# Patient Record
Sex: Male | Born: 1950 | Race: White | Hispanic: No | Marital: Married | Smoking: Never smoker
Health system: Southern US, Community
[De-identification: ages and names within clinical notes are randomized; demographics above are authoritative.]

## PROBLEM LIST (undated history)

## (undated) DIAGNOSIS — Z85828 Personal history of other malignant neoplasm of skin: Secondary | ICD-10-CM

## (undated) DIAGNOSIS — K635 Polyp of colon: Secondary | ICD-10-CM

## (undated) DIAGNOSIS — Z8719 Personal history of other diseases of the digestive system: Secondary | ICD-10-CM

## (undated) DIAGNOSIS — I10 Essential (primary) hypertension: Secondary | ICD-10-CM

## (undated) DIAGNOSIS — E119 Type 2 diabetes mellitus without complications: Secondary | ICD-10-CM

## (undated) DIAGNOSIS — I77811 Abdominal aortic ectasia: Secondary | ICD-10-CM

## (undated) DIAGNOSIS — G4730 Sleep apnea, unspecified: Secondary | ICD-10-CM

## (undated) DIAGNOSIS — D649 Anemia, unspecified: Secondary | ICD-10-CM

## (undated) DIAGNOSIS — F419 Anxiety disorder, unspecified: Secondary | ICD-10-CM

## (undated) DIAGNOSIS — F329 Major depressive disorder, single episode, unspecified: Secondary | ICD-10-CM

## (undated) DIAGNOSIS — E785 Hyperlipidemia, unspecified: Secondary | ICD-10-CM

## (undated) DIAGNOSIS — M1990 Unspecified osteoarthritis, unspecified site: Secondary | ICD-10-CM

## (undated) DIAGNOSIS — E669 Obesity, unspecified: Secondary | ICD-10-CM

## (undated) DIAGNOSIS — G629 Polyneuropathy, unspecified: Secondary | ICD-10-CM

## (undated) DIAGNOSIS — F32A Depression, unspecified: Secondary | ICD-10-CM

## (undated) HISTORY — DX: Personal history of other malignant neoplasm of skin: Z85.828

## (undated) HISTORY — DX: Obesity, unspecified: E66.9

## (undated) HISTORY — DX: Type 2 diabetes mellitus without complications: E11.9

## (undated) HISTORY — PX: HERNIA REPAIR: SHX51

## (undated) HISTORY — DX: Major depressive disorder, single episode, unspecified: F32.9

## (undated) HISTORY — DX: Polyneuropathy, unspecified: G62.9

## (undated) HISTORY — PX: JOINT REPLACEMENT: SHX530

## (undated) HISTORY — PX: MEDIAL PARTIAL KNEE REPLACEMENT: SHX5965

## (undated) HISTORY — DX: Morbid (severe) obesity due to excess calories: E66.01

## (undated) HISTORY — DX: Sleep apnea, unspecified: G47.30

## (undated) HISTORY — DX: Hyperlipidemia, unspecified: E78.5

## (undated) HISTORY — DX: Polyp of colon: K63.5

## (undated) HISTORY — DX: Depression, unspecified: F32.A

## (undated) HISTORY — PX: KNEE ARTHROSCOPY: SHX127

## (undated) HISTORY — DX: Abdominal aortic ectasia: I77.811

## (undated) HISTORY — DX: Unspecified osteoarthritis, unspecified site: M19.90

---

## 2005-10-30 ENCOUNTER — Ambulatory Visit: Payer: Self-pay | Admitting: Gastroenterology

## 2006-07-18 ENCOUNTER — Ambulatory Visit: Payer: Self-pay | Admitting: General Surgery

## 2006-09-10 ENCOUNTER — Ambulatory Visit: Payer: Self-pay | Admitting: Family Medicine

## 2013-05-01 DIAGNOSIS — I10 Essential (primary) hypertension: Secondary | ICD-10-CM

## 2013-05-01 HISTORY — DX: Essential (primary) hypertension: I10

## 2014-11-11 ENCOUNTER — Ambulatory Visit (INDEPENDENT_AMBULATORY_CARE_PROVIDER_SITE_OTHER): Payer: Commercial Managed Care - HMO | Admitting: Family Medicine

## 2014-11-11 ENCOUNTER — Encounter: Payer: Self-pay | Admitting: Family Medicine

## 2014-11-11 VITALS — BP 138/87 | HR 96 | Temp 98.7°F | Ht 68.0 in | Wt 278.0 lb

## 2014-11-11 DIAGNOSIS — B356 Tinea cruris: Secondary | ICD-10-CM

## 2014-11-11 DIAGNOSIS — Z1283 Encounter for screening for malignant neoplasm of skin: Secondary | ICD-10-CM | POA: Diagnosis not present

## 2014-11-11 DIAGNOSIS — R030 Elevated blood-pressure reading, without diagnosis of hypertension: Secondary | ICD-10-CM | POA: Diagnosis not present

## 2014-11-11 DIAGNOSIS — N400 Enlarged prostate without lower urinary tract symptoms: Secondary | ICD-10-CM

## 2014-11-11 DIAGNOSIS — R3911 Hesitancy of micturition: Secondary | ICD-10-CM | POA: Diagnosis not present

## 2014-11-11 DIAGNOSIS — I10 Essential (primary) hypertension: Secondary | ICD-10-CM | POA: Insufficient documentation

## 2014-11-11 DIAGNOSIS — N410 Acute prostatitis: Secondary | ICD-10-CM | POA: Diagnosis not present

## 2014-11-11 DIAGNOSIS — F339 Major depressive disorder, recurrent, unspecified: Secondary | ICD-10-CM | POA: Insufficient documentation

## 2014-11-11 DIAGNOSIS — F330 Major depressive disorder, recurrent, mild: Secondary | ICD-10-CM | POA: Diagnosis not present

## 2014-11-11 DIAGNOSIS — IMO0001 Reserved for inherently not codable concepts without codable children: Secondary | ICD-10-CM

## 2014-11-11 DIAGNOSIS — N522 Drug-induced erectile dysfunction: Secondary | ICD-10-CM

## 2014-11-11 DIAGNOSIS — E669 Obesity, unspecified: Secondary | ICD-10-CM

## 2014-11-11 HISTORY — DX: Morbid (severe) obesity due to excess calories: E66.01

## 2014-11-11 HISTORY — DX: Obesity, unspecified: E66.9

## 2014-11-11 LAB — UA/M W/RFLX CULTURE, ROUTINE
Bilirubin, UA: NEGATIVE
Glucose, UA: NEGATIVE
Ketones, UA: NEGATIVE
Leukocytes, UA: NEGATIVE
Nitrite, UA: NEGATIVE
Protein, UA: NEGATIVE
RBC UA: NEGATIVE
Specific Gravity, UA: 1.025 (ref 1.005–1.030)
UUROB: 1 mg/dL (ref 0.2–1.0)
pH, UA: 6 (ref 5.0–7.5)

## 2014-11-11 LAB — POC HEMOCCULT BLD/STL (OFFICE/1-CARD/DIAGNOSTIC): FECAL OCCULT BLD: NEGATIVE

## 2014-11-11 MED ORDER — SULFAMETHOXAZOLE-TRIMETHOPRIM 800-160 MG PO TABS: 1.0000 | ORAL_TABLET | Freq: Two times a day (BID) | ORAL | Status: DC

## 2014-11-11 MED ORDER — SILDENAFIL CITRATE 100 MG PO TABS: 50.0000 mg | ORAL_TABLET | Freq: Every day | ORAL | Status: DC | PRN

## 2014-11-11 NOTE — Progress Notes (Signed)
BP 138/87 mmHg  Pulse 96  Temp(Src) 98.7 F (37.1 C)  Ht 5\' 8"  (1.727 m)  Wt 278 lb (126.1 kg)  BMI 42.28 kg/m2  SpO2 96%   Subjective:    Patient ID: Duane Hughes, male    DOB: 1951-04-12, 64 y.o.   MRN: [id number]  HPI: Duane Hughes is a 64 y.o. male  Chief Complaint  Patient presents with  . Establish Care  . Referral    needs referral to [facility]   He has just gotten Medicare and will return for his wellness visit He switched medication from Vibriid to venlafaxine, he feels all of a sudden, having some dysuria, takes a while to get going; maybe correlates with the medicine, real close but can't say 100% Chemical imbalance, realized he had depression; diagnosed with major depressive disorder; got onto medicine Vybriid, and he thought it was working; wasn't having bad thoughts, only rarely; but had tons of pain though, not sure if the Vybriid or not; pain goes hand in hand with depression his family member told him; venlafaxine made the pain goes away, was hard to get out of a chair; still has arthritis pain, played football; venlafaxine is really helping mood; much more laid back; no irritability Some doctor years ago said he said he had an enlarged prostate; never been an issue until 2 months ago Takes a while to start urine, hard to shut off, dribbles at the end; stream is weak; yes a little pain with urination Cholesterol has been normal in the past, not checked in a while BP always goes up at the doctor; no one in the family with HTN Big hamburger yesterday He used to weighed 295 pounds; he wishes he could lose more Wife and patient are tryiing to eat better, not eating as much, smaller portions  Some issues with sexual function; may be side effects of medicine; different from before; still getting morning erections; no loss of muscle mass, hair loss, testicular atrophy  Relevant past medical, surgical, family and social history reviewed and updated as  indicated. Interim medical history since our last visit reviewed. Allergies and medications reviewed and updated. No known family hx of depression  Review of Systems Per HPI unless specifically indicated above     Objective:    BP 138/87 mmHg  Pulse 96  Temp(Src) 98.7 F (37.1 C)  Ht 5\' 8"  (1.727 m)  Wt 278 lb (126.1 kg)  BMI 42.28 kg/m2  SpO2 96%  Wt Readings from Last 3 Encounters:  11/11/14 278 lb (126.1 kg)    Physical Exam  Constitutional: He appears well-developed and well-nourished. No distress.  Morbidly obese  HENT:  Head: Normocephalic and atraumatic.  Eyes: EOM are normal. No scleral icterus.  Neck: No thyromegaly present.  Cardiovascular: Normal rate and regular rhythm.   Pulmonary/Chest: Effort normal and breath sounds normal.  Abdominal: Soft. Bowel sounds are normal. He exhibits no distension.  Genitourinary: Rectal exam shows no external hemorrhoid, no internal hemorrhoid, no fissure, no mass, no tenderness and anal tone normal. Guaiac negative stool. Prostate is enlarged (1+, slightly tender).  Musculoskeletal: He exhibits no edema.  Neurological: Coordination normal.  Skin: Skin is warm and dry. Rash noted. No pallor.  Well-demarcated, circumscribed erythematous rash with fine scale in inner thighs, groin area  Psychiatric: He has a normal mood and affect. His behavior is normal. Judgment and thought content normal.       Assessment & Plan:   Problem List  Items Addressed This Visit      Other   Morbid obesity    Encouraged healthy eating, weight loss      Relevant Orders   Lipid Panel w/o Chol/HDL Ratio (Completed)   Comprehensive metabolic panel (Completed)   Major depressive disorder, recurrent - Primary    He sounds to be doing well with current medication, though bothered by potential side effects; he wishes to stay on current medicine      Relevant Medications   buPROPion (WELLBUTRIN SR) 150 MG 12 hr tablet   venlafaxine XR (EFFEXOR-XR)  150 MG 24 hr capsule   Elevated blood pressure    Improved on recheck; DASH guidelines encouraged; weight loss would help      Urinary hesitancy    May be secondary to prostatitis; may be secondary to medication; consider flomax vs proscar      Relevant Orders   PSA (Completed)   CBC with Differential/Platelet (Completed)   UA/M w/rflx Culture, Routine (Completed)   Enlarged prostate    Check PSA (drawn prior to DRE); consider Proscar or similar agent      Relevant Orders   PSA (Completed)   POC Hemoccult Bld/Stl (1-Cd Office Dx) (Completed)   Skin exam, screening for cancer    Patient already has an appt with dermatologist for skin exam; significant solar damage on arms, likely has some AKs      Relevant Orders   Ambulatory referral to Dermatology   Drug-induced erectile dysfunction    Trial of viagra; discussed use, risk of combination with nitrates, unsafe drop in BP, make sure to let medical personnel know if he has chest pain and has taken one within last few days       Other Visit Diagnoses    Prostatitis, acute        Tinea cruris        Relevant Medications    sulfamethoxazole-trimethoprim (BACTRIM DS,SEPTRA DS) 800-160 MG per tablet    econazole nitrate 1 % cream       Follow up plan: Return in about 3 weeks (around 12/02/2014) for Welcome to Medicare Visit.

## 2014-11-11 NOTE — Patient Instructions (Addendum)
Start new antibiotics Let's recheck PSA in [redacted] weeks along with blood pressure, Welcome to Medicare visit You may try the new Viagra Your goal blood pressure is less than 150 mmHg on top Try to follow the DASH guidelines (DASH stands for Dietary Approaches to Stop Hypertension) Try to limit the sodium in your diet.  Ideally, consume less than 1.5 grams (less than 1,500mg ) per day. Do not add salt when cooking or at the table.  Check the sodium amount on labels when shopping, and choose items lower in sodium when given a choice. Avoid or limit foods that already contain a lot of sodium. Eat a diet rich in fruits and vegetables and whole grains. I would suggest a low-dose (81 mg) baby aspirin daily (coated) for heart protection

## 2014-11-12 LAB — CBC WITH DIFFERENTIAL/PLATELET
Basophils Absolute: 0 10*3/uL (ref 0.0–0.2)
Basos: 1 %
EOS (ABSOLUTE): 0.2 10*3/uL (ref 0.0–0.4)
Eos: 3 %
HEMOGLOBIN: 14.3 g/dL (ref 12.6–17.7)
Hematocrit: 43.5 % (ref 37.5–51.0)
IMMATURE GRANS (ABS): 0 10*3/uL (ref 0.0–0.1)
Immature Granulocytes: 0 %
Lymphocytes Absolute: 2 10*3/uL (ref 0.7–3.1)
Lymphs: 28 %
MCH: 29.7 pg (ref 26.6–33.0)
MCHC: 32.9 g/dL (ref 31.5–35.7)
MCV: 90 fL (ref 79–97)
Monocytes Absolute: 0.6 10*3/uL (ref 0.1–0.9)
Monocytes: 8 %
Neutrophils Absolute: 4.3 10*3/uL (ref 1.4–7.0)
Neutrophils: 60 %
Platelets: 293 10*3/uL (ref 150–379)
RBC: 4.81 x10E6/uL (ref 4.14–5.80)
RDW: 14.1 % (ref 12.3–15.4)
WBC: 7.2 10*3/uL (ref 3.4–10.8)

## 2014-11-12 LAB — LIPID PANEL W/O CHOL/HDL RATIO
Cholesterol, Total: 197 mg/dL (ref 100–199)
HDL: 40 mg/dL (ref >39)
LDL CALC: 101 mg/dL — AB (ref 0–99)
Triglycerides: 281 mg/dL — ABNORMAL HIGH (ref 0–149)
VLDL Cholesterol Cal: 56 mg/dL — ABNORMAL HIGH (ref 5–40)

## 2014-11-12 LAB — COMPREHENSIVE METABOLIC PANEL
A/G RATIO: 1.4 (ref 1.1–2.5)
ALK PHOS: 71 IU/L (ref 39–117)
ALT: 43 IU/L (ref 0–44)
AST: 40 IU/L (ref 0–40)
Albumin: 4.4 g/dL (ref 3.6–4.8)
BUN / CREAT RATIO: 16 (ref 10–22)
BUN: 16 mg/dL (ref 8–27)
Bilirubin Total: 0.3 mg/dL (ref 0.0–1.2)
CHLORIDE: 100 mmol/L (ref 97–108)
CO2: 22 mmol/L (ref 18–29)
CREATININE: 1.02 mg/dL (ref 0.76–1.27)
Calcium: 9.7 mg/dL (ref 8.6–10.2)
GFR calc non Af Amer: 77 mL/min/{1.73_m2} (ref >59)
GFR, EST AFRICAN AMERICAN: 89 mL/min/{1.73_m2} (ref >59)
GLUCOSE: 115 mg/dL — AB (ref 65–99)
Globulin, Total: 3.1 g/dL (ref 1.5–4.5)
Potassium: 4.5 mmol/L (ref 3.5–5.2)
Sodium: 144 mmol/L (ref 134–144)
TOTAL PROTEIN: 7.5 g/dL (ref 6.0–8.5)

## 2014-11-12 LAB — PSA: PROSTATE SPECIFIC AG, SERUM: 0.8 ng/mL (ref 0.0–4.0)

## 2014-11-13 DIAGNOSIS — N522 Drug-induced erectile dysfunction: Secondary | ICD-10-CM | POA: Insufficient documentation

## 2014-11-13 MED ORDER — ECONAZOLE NITRATE 1 % EX CREA: TOPICAL_CREAM | Freq: Every day | CUTANEOUS | Status: DC

## 2014-11-13 NOTE — Assessment & Plan Note (Signed)
Trial of viagra; discussed use, risk of combination with nitrates, unsafe drop in BP, make sure to let medical personnel know if he has chest pain and has taken one within last few days

## 2014-11-13 NOTE — Assessment & Plan Note (Signed)
Patient already has an appt with dermatologist for skin exam; significant solar damage on arms, likely has some AKs

## 2014-11-13 NOTE — Assessment & Plan Note (Signed)
Improved on recheck; DASH guidelines encouraged; weight loss would help

## 2014-11-13 NOTE — Assessment & Plan Note (Signed)
Check PSA (drawn prior to DRE); consider Proscar or similar agent

## 2014-11-13 NOTE — Assessment & Plan Note (Signed)
Encouraged healthy eating, weight loss 

## 2014-11-13 NOTE — Assessment & Plan Note (Signed)
He sounds to be doing well with current medication, though bothered by potential side effects; he wishes to stay on current medicine

## 2014-11-13 NOTE — Assessment & Plan Note (Signed)
May be secondary to prostatitis; may be secondary to medication; consider flomax vs proscar

## 2014-11-17 ENCOUNTER — Telehealth: Payer: Self-pay | Admitting: Family Medicine

## 2014-11-17 DIAGNOSIS — E781 Pure hyperglyceridemia: Secondary | ICD-10-CM

## 2014-11-17 NOTE — Telephone Encounter (Signed)
Home number disconnected. Tried calling work number and it says I am not dialing it correctly.

## 2014-11-17 NOTE — Telephone Encounter (Signed)
Please let patient know labs are back His kidney function and liver function tests look great See how he's doing overall, any problems with the antibiotics Suggest that since he's on antibiotics, have him either eat yogurt or take probiotics for a few weeks to replace some of the healthy germs in the gut that may be affected by the antibiotic If he's still having symptoms, let me know Triglycerides are high, part of the cholesterol panel, so suggest that he work on weight loss (easier said than done), diet low in sweets and simple carbs, and start fish oil or krill oil twice a day

## 2014-11-18 NOTE — Telephone Encounter (Signed)
Attempted to reach patient again, neither number working.

## 2014-11-19 NOTE — Telephone Encounter (Signed)
Number disconnected still.

## 2014-11-20 NOTE — Telephone Encounter (Signed)
Send note if unable to reach

## 2014-11-24 ENCOUNTER — Encounter: Payer: Self-pay | Admitting: Family Medicine

## 2014-11-24 NOTE — Telephone Encounter (Signed)
Mailing copy of this communication

## 2014-11-30 ENCOUNTER — Other Ambulatory Visit: Payer: Self-pay | Admitting: Family Medicine

## 2014-11-30 NOTE — Telephone Encounter (Signed)
Routing to provider  

## 2014-12-01 ENCOUNTER — Telehealth: Payer: Self-pay | Admitting: Family Medicine

## 2014-12-16 NOTE — Telephone Encounter (Signed)
None

## 2014-12-17 ENCOUNTER — Encounter: Payer: Self-pay | Admitting: Family Medicine

## 2014-12-17 ENCOUNTER — Ambulatory Visit (INDEPENDENT_AMBULATORY_CARE_PROVIDER_SITE_OTHER): Payer: Commercial Managed Care - HMO | Admitting: Family Medicine

## 2014-12-17 VITALS — BP 142/88 | HR 87 | Temp 99.0°F | Wt 286.0 lb

## 2014-12-17 DIAGNOSIS — I10 Essential (primary) hypertension: Secondary | ICD-10-CM

## 2014-12-17 DIAGNOSIS — Z136 Encounter for screening for cardiovascular disorders: Secondary | ICD-10-CM | POA: Diagnosis not present

## 2014-12-17 DIAGNOSIS — Z23 Encounter for immunization: Secondary | ICD-10-CM | POA: Diagnosis not present

## 2014-12-17 DIAGNOSIS — Z0000 Encounter for general adult medical examination without abnormal findings: Secondary | ICD-10-CM | POA: Diagnosis not present

## 2014-12-17 DIAGNOSIS — Z1211 Encounter for screening for malignant neoplasm of colon: Secondary | ICD-10-CM | POA: Diagnosis not present

## 2014-12-17 DIAGNOSIS — Z87891 Personal history of nicotine dependence: Secondary | ICD-10-CM

## 2014-12-17 NOTE — Assessment & Plan Note (Signed)
DASH guidelines, weight loss 

## 2014-12-17 NOTE — Assessment & Plan Note (Signed)
Encouraged weight loss 

## 2014-12-17 NOTE — Progress Notes (Signed)
Patient: Duane Hughes, Male    DOB: July 13, 1950, 64 y.o.   MRN: [id number]  Visit Date: 12/17/2014  Today's Provider: Enid Derry, MD   Chief Complaint  Patient presents with  . Annual Exam    Medicare Wellness Exam    Subjective:   Duane Hughes is a 64 y.o. male who presents today for his Welcome to [facility] Wellness Visit.  Caregiver input:  N/a  USTSPF Grade A and B guidelines addressed below: Alcohol: n/a Depression: PHQ score 0 Hypertension: high today; he gives platelets and they check that all the time, goodness of his heart donation; had some salty foods, but not excessive salt in the last 48 hours; just came from [place] with grilled chicken Obesity: up from vacation, five pounds; he is trying to eat better; cutting out fries Tobacco use: former recreational smoker HIV and Hep C: politely declined Lipids: just done Glucose: just done Colorectal cancer screening: due, last more than five years ago, maybe 8-10 years Breast cancer: no breast lumps Lung cancer: recreational, not a candidate for CT scan Osteoporosis: not at risk AAA: ordered US Aspirin: he gives platelets; I recommended low dose 81 mg aspirin at last visit and he started but wants to do platelet donation Fall prevention: not at risk Skin cancer prevention: sunscreen recommended Exercise: there is room for improvement but he plays golf 2-3 days a week, uses a cart  Depression screen PHQ 2/9 12/17/2014  Decreased Interest 0  Down, Depressed, Hopeless 0  PHQ - 2 Score 0   HPI  Here for medicare wellness visit  Review of Systems  Past Medical History  Diagnosis Date  . Arthritis     back, hands, knees  . History of skin cancer     hand  . Sleep apnea     Past Surgical History  Procedure Laterality Date  . Medial partial knee replacement Left     2000s?  Marland Kitchen Hernia repair    . Knee arthroscopy      several scopes done   Family History  Problem Relation Age of Onset  . Alcohol abuse  Mother   . Rheum arthritis Mother    Social History   Social History  . Marital Status: Married    Spouse Name: N/A  . Number of Children: N/A  . Years of Education: N/A   Occupational History  . Not on file.   Social History Main Topics  . Smoking status: Never Smoker   . Smokeless tobacco: Never Used  . Alcohol Use: No  . Drug Use: No  . Sexual Activity: Not on file   Other Topics Concern  . Not on file   Social History Narrative    Outpatient Encounter Prescriptions as of 12/17/2014  Medication Sig  . buPROPion (WELLBUTRIN SR) 150 MG 12 hr tablet Take 150 mg by mouth 2 (two) times daily.  . sildenafil (VIAGRA) 100 MG tablet Take 0.5 tablets (50 mg total) by mouth daily as needed for erectile dysfunction.  Marland Kitchen venlafaxine XR (EFFEXOR-XR) 150 MG 24 hr capsule TAKE 1 CAPSULE BY MOUTH DAILY  . econazole nitrate 1 % cream Apply topically daily. Apply to rash in groin once a day (Patient not taking: Reported on 12/17/2014)  . [DISCONTINUED] sulfamethoxazole-trimethoprim (BACTRIM DS,SEPTRA DS) 800-160 MG per tablet Take 1 tablet by mouth 2 (two) times daily. (Patient not taking: Reported on 12/17/2014)   No facility-administered encounter medications on file as of 12/17/2014.    Functional Ability / Safety Screening  1.  Was the timed Get Up and Go test longer than 30 seconds?  no 2.  Does the patient need help with the phone, transportation, shopping,      preparing meals, housework, laundry, medications, or managing money?  no 3.  Does the patient's home have:  loose throw rugs in the hallway?   yes      Grab bars in the bathroom? no      Handrails on the stairs?   yes      Poor lighting?   no 4.  Has the patient noticed any hearing difficulties?   yes Two hearing aides  Fall Risk Assessment See under rooming  Depression Screen See under rooming Depression screen [facility] 2/9 12/17/2014  Decreased Interest 0  Down, Depressed, Hopeless 0  PHQ - 2 Score 0   Advanced  Directives Does patient have a HCPOA?    not sure  If yes, name and contact information: Selinda Eon at cell number [phone], work number [phone] Does patient have a living will or MOST form?  yes  Objective:   Vitals: BP 156/96 mmHg  Pulse 87  Temp(Src) 99 F (37.2 C)  Wt 286 lb (129.729 kg)  SpO2 97% Body mass index is 43.5 kg/(m^2).  Visual Acuity Screening   Right eye Left eye Both eyes  Without correction: 20/20 20/20   With correction:      Physical Exam  Constitutional: He appears well-developed and well-nourished. No distress.  Cardiovascular: Normal rate.   Pulmonary/Chest: Effort normal.   Mood/affect:  Euthymic, very pleasant Appearance:  Casual, neat, good hygiene  Cognitive Testing - 6-CIT  Correct? Score   What year is it? yes 0 Yes = 0    No = 4  What month is it? yes 0 Yes = 0    No = 3  Remember:     Duane Hughes, [place], [place]     What time is it? yes 0 Yes = 0    No = 3  Count backwards from 20 to 1 yes 0 Correct = 0    1 error = 2   More than 1 error = 4  Say the months of the year in reverse. yes 0 Correct = 0    1 error = 2   More than 1 error = 4  What address did I ask you to remember? yes 0 Correct = 0  1 error = 2    2 error = 4    3 error = 6    4 error = 8    All wrong = 10       TOTAL SCORE  0/28   Interpretation:  Normal  Normal (0-7) Abnormal (8-28)    Assessment & Plan:     Annual Wellness Visit  Reviewed patient's Family Medical History Reviewed and updated list of patient's medical providers Assessment of cognitive impairment was done Assessed patient's functional ability Established a written schedule for health screening Marissa Completed and Reviewed   There is no immunization history on file for this patient.  Health Maintenance  Topic Date Due  . Hepatitis C Screening  03-14-1951  . HIV Screening  11/27/1964  . TETANUS/TDAP  11/27/1968  . ZOSTAVAX  11/27/2009  . PNA vac Low Risk  Adult (1 of 2 - PCV13) 11/28/2014  . INFLUENZA VACCINE  11/30/2014  . COLONOSCOPY  05/01/2016     Discussed health benefits of physical activity, and encouraged him  to engage in regular exercise appropriate for his age and condition.   No orders of the defined types were placed in this encounter.    Current outpatient prescriptions:  .  buPROPion (WELLBUTRIN SR) 150 MG 12 hr tablet, Take 150 mg by mouth 2 (two) times daily., Disp: , Rfl:  .  sildenafil (VIAGRA) 100 MG tablet, Take 0.5 tablets (50 mg total) by mouth daily as needed for erectile dysfunction., Disp: 5 tablet, Rfl: 11 .  venlafaxine XR (EFFEXOR-XR) 150 MG 24 hr capsule, TAKE 1 CAPSULE BY MOUTH DAILY, Disp: 30 capsule, Rfl: 0 .  econazole nitrate 1 % cream, Apply topically daily. Apply to rash in groin once a day (Patient not taking: Reported on 12/17/2014), Disp: 30 g, Rfl: 0 Medications Discontinued During This Encounter  Medication Reason  . sulfamethoxazole-trimethoprim (BACTRIM DS,SEPTRA DS) 800-160 MG per tablet Completed Course    Next Medicare Wellness Visit in 12+ months  Problem List Items Addressed This Visit      Cardiovascular and Mediastinum   Benign hypertension    DASH guidelines, weight loss        Other   Morbid obesity    Encouraged weight loss      Colon cancer screening    Refer to GI for colonoscopy      Relevant Orders   Ambulatory referral to Gastroenterology   Preventative health care - Primary    Welcome to medicare visit performed; age-appropriate guidelines followed, tests ordered, return in 1 year for next medicare visit      Screening for cardiovascular condition    EKG done at part of Welcome to Medicare visit; copy given to patient to keep with him; NSR, no abnormal ST-T wave changes      Relevant Orders   EKG 12-Lead (Completed)   Need for prophylactic vaccination against Streptococcus pneumoniae (pneumococcus)    PCV-13 recommended today (Prevnar) and given; no more  PCV-13 vaccines recommended in his lifetime per current ACIP guidelines; next pneumonia vaccine will be the PPSV-23 (Pneumovax) in 6-12 months      Relevant Orders   Pneumococcal conjugate vaccine 13-valent (Completed)   Hx of tobacco use, presenting hazards to health    [place] to screen for AAA, history of tobacco use      Relevant Orders   [place] Screening AAA

## 2014-12-17 NOTE — Patient Instructions (Addendum)
Strengthening leg exercises recommended and fall prevention to help decrease falls and fractures in the future You were given PCV-13 (Prevnar) today to help prevent pneumonia You will be due for a PPSV-23 (Pneumovax) 6-12 months from now If you are interested in a shingles vaccine in the future, you can get that at a local pharmacy 30 or more days from now  Flu shots recommended yearly around October I have entered a referral for a colonoscopy I have ordered a one-time screening test to rule-out an abdominal aortic aneurysm (or AAA) If you have not heard anything from my staff in a week about any orders/referrals/studies from today, please contact us here to follow-up [phone] Stay active, building up gradually to 150 minutes per week A baby (81 mg) aspirin is recommended daily, so please do talk with the facility where 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  Maintain regular health, dental, and eye exams.  Eat a healthy diet. Foods like vegetables, fruits, whole grains, low-fat dairy products, and lean protein foods contain the nutrients you need and are low in calories. Decrease your intake of foods high in solid fats, added sugars, and salt. Get information about a proper diet from your health care provider, if necessary.  Regular physical exercise is one of the most important things you can do for your health. Most adults should get at least 150 minutes of moderate-intensity  exercise (any activity that increases your heart rate and causes you to sweat) each week. In addition, most adults need muscle-strengthening exercises on 2 or more days a week.   Maintain a healthy weight. The body mass index (BMI) is a screening tool to identify possible weight problems. It provides an estimate of body fat based on height and weight. Your health care provider can find your BMI and can help you achieve or maintain a healthy weight. For males 20 years and older:  A BMI below 18.5 is considered underweight.  A BMI of 18.5 to 24.9 is normal.  A BMI of 25 to 29.9 is considered overweight.  A BMI of 30 and above is considered obese.  Maintain normal blood lipids and cholesterol by exercising and minimizing your intake of saturated fat. Eat a balanced diet with plenty of fruits and vegetables. Blood tests for lipids and cholesterol should begin at age 65 and be repeated every 5 years. If your lipid or cholesterol levels are high, you are over age 30, or you are at high risk for heart disease, you may need your cholesterol levels checked more frequently.Ongoing high lipid and cholesterol levels should be treated with medicines if diet and exercise are not working.  If you smoke, find out from your health care provider how to quit.  If you do not use tobacco, do not start.  Lung cancer screening is recommended for adults aged 65-80 years who are at high risk for developing lung cancer because of a history of smoking. A yearly low-dose CT scan of the lungs is recommended for people  who have at least a 30-pack-year history of smoking and are current smokers or have quit within the past 15 years. A pack year of smoking is smoking an average of 1 pack of cigarettes a day for 1 year (for example, a 30-pack-year history of smoking could mean smoking 1 pack a day for 30 years or 2 packs a day for 15 years). Yearly screening should continue until the smoker has stopped smoking for at least 15  years. Yearly screening should be stopped for people who develop a health problem that would prevent them from having lung cancer treatment.  If you choose to drink alcohol, do not have more than 2 drinks per day. One drink is considered to be 12 oz (360 mL) of beer, 5 oz (150 mL) of wine, or 1.5 oz (45 mL) of liquor.  Avoid the use of street drugs. Do not share needles with anyone. Ask for help if you need support or instructions about stopping the use of drugs.  High blood pressure causes heart disease and increases the risk of stroke. Blood pressure should be checked at least every 1-2 years. Ongoing high blood pressure should be treated with medicines if weight loss and exercise are not effective.  If you are 105-65 years old, ask your health care provider if you should take aspirin to prevent heart disease.  Diabetes screening involves taking a blood sample to check your fasting blood sugar level. This should be done once every 3 years after age 65 if you are at a normal weight and without risk factors for diabetes. Testing should be considered at a younger age or be carried out more frequently if you are overweight and have at least 1 risk factor for diabetes.  Colorectal cancer can be detected and often prevented. Most routine colorectal cancer screening begins at the age of 27 and continues through age 63. However, your health care provider may recommend screening at an earlier age if you have risk factors for colon cancer. On a yearly basis, your health care provider may provide home test kits to check for hidden blood in the stool. A small camera at the end of a tube may be used to directly examine the colon (sigmoidoscopy or colonoscopy) to detect the earliest forms of colorectal cancer. Talk to your health care provider about this at age 65 when routine screening begins. A direct exam of the colon should be repeated every 5-10 years through age 65, unless early forms of precancerous polyps or  small growths are found.  People who are at an increased risk for hepatitis B should be screened for this virus. You are considered at high risk for hepatitis B if:  You were born in a country where hepatitis B occurs often. Talk with your health care provider about which countries are considered high risk.  Your parents were born in a high-risk country and you have not received a shot to protect against hepatitis B (hepatitis B vaccine).  You have HIV or AIDS.  You use needles to inject street drugs.  You live with, or have sex with, someone who has hepatitis B.  You are a man who has sex with other men (MSM).  You get hemodialysis treatment.  You take certain medicines for conditions like cancer, organ transplantation, and autoimmune conditions.  Hepatitis C blood testing is recommended for all people born from [birth year] through 1966 and any individual with known risk factors for hepatitis C.  Healthy men should no longer receive  prostate-specific antigen (PSA) blood tests as part of routine cancer screening. Talk to your health care provider about prostate cancer screening.  Testicular cancer screening is not recommended for adolescents or adult males who have no symptoms. Screening includes self-exam, a health care provider exam, and other screening tests. Consult with your health care provider about any symptoms you have or any concerns you have about testicular cancer.  Practice safe sex. Use condoms and avoid high-risk sexual practices to reduce the spread of sexually transmitted infections (STIs).  You should be screened for STIs, including gonorrhea and chlamydia if:  You are sexually active and are younger than 24 years.  You are older than 24 years, and your health care provider tells you that you are at risk for this type of infection.  Your sexual activity has changed since you were last screened, and you are at an increased risk for chlamydia or gonorrhea. Ask your health  care provider if you are at risk.  If you are at risk of being infected with HIV, it is recommended that you take a prescription medicine daily to prevent HIV infection. This is called pre-exposure prophylaxis (PrEP). You are considered at risk if:  You are a man who has sex with other men (MSM).  You are a heterosexual man who is sexually active with multiple partners.  You take drugs by injection.  You are sexually active with a partner who has HIV.  Talk with your health care provider about whether you are at high risk of being infected with HIV. If you choose to begin PrEP, you should first be tested for HIV. You should then be tested every 3 months for as long as you are taking PrEP.  Use sunscreen. Apply sunscreen liberally and repeatedly throughout the day. You should seek shade when your shadow is shorter than you. Protect yourself by wearing long sleeves, pants, a wide-brimmed hat, and sunglasses year round whenever you are outdoors.  Tell your health care provider of new moles or changes in moles, especially if there is a change in shape or color. Also, tell your health care provider if a mole is larger than the size of a pencil eraser.  A one-time screening for abdominal aortic aneurysm (AAA) and surgical repair of large AAAs by ultrasound is recommended for men aged 69-75 years who are current or former smokers.  Stay current with your vaccines (immunizations). Document Released: 10/14/2007 Document Revised: 04/22/2013 Document Reviewed: 09/12/2010 [facility] Patient Information 2015 [place], [place]. This information is not intended to replace advice given to you by your health care provider. Make sure you discuss any questions you have with your health care provider.  DASH Eating Plan DASH stands for "Dietary Approaches to Stop Hypertension." The DASH eating plan is a healthy eating plan that has been shown to reduce high blood pressure (hypertension). Additional health benefits  may include reducing the risk of type 2 diabetes mellitus, heart disease, and stroke. The DASH eating plan may also help with weight loss. WHAT DO I NEED TO KNOW ABOUT THE DASH EATING PLAN? For the DASH eating plan, you will follow these general guidelines:  Choose foods with a percent daily value for sodium of less than 5% (as listed on the food label).  Use salt-free seasonings or herbs instead of table salt or sea salt.  Check with your health care provider or pharmacist before using salt substitutes.  Eat lower-sodium products, often labeled as "lower sodium" or "no salt added."  Eat fresh  foods.  Eat more vegetables, fruits, and low-fat dairy products.  Choose whole grains. Look for the word "whole" as the first word in the ingredient list.  Choose fish and skinless chicken or [place] more often than red meat. Limit fish, poultry, and meat to 6 oz (170 g) each day.  Limit sweets, desserts, sugars, and sugary drinks.  Choose heart-healthy fats.  Limit cheese to 1 oz (28 g) per day.  Eat more home-cooked food and less restaurant, buffet, and fast food.  Limit fried foods.  Cook foods using methods other than frying.  Limit canned vegetables. If you do use them, rinse them well to decrease the sodium.  When eating at a restaurant, ask that your food be prepared with less salt, or no salt if possible. WHAT FOODS CAN I EAT? Seek help from a dietitian for individual calorie needs. Grains Whole grain or whole wheat bread. Brown rice. Whole grain or whole wheat pasta. Quinoa, bulgur, and whole grain cereals. Low-sodium cereals. Corn or whole wheat flour tortillas. Whole grain cornbread. Whole grain crackers. Low-sodium crackers. Vegetables Fresh or frozen vegetables (raw, steamed, roasted, or grilled). Low-sodium or reduced-sodium tomato and vegetable juices. Low-sodium or reduced-sodium tomato sauce and paste. Low-sodium or reduced-sodium canned vegetables.  Fruits All fresh,  canned (in natural juice), or frozen fruits. Meat and Other Protein Products Ground beef (85% or leaner), grass-fed beef, or beef trimmed of fat. Skinless chicken or [place]. Ground chicken or [place]. Pork trimmed of fat. All fish and seafood. Eggs. Dried beans, peas, or lentils. Unsalted nuts and seeds. Unsalted canned beans. Dairy Low-fat dairy products, such as skim or 1% milk, 2% or reduced-fat cheeses, low-fat ricotta or cottage cheese, or plain low-fat yogurt. Low-sodium or reduced-sodium cheeses. Fats and Oils Tub margarines without trans fats. Light or reduced-fat mayonnaise and salad dressings (reduced sodium). Avocado. Safflower, olive, or canola oils. Natural peanut or almond butter. Other Unsalted popcorn and pretzels. The items listed above may not be a complete list of recommended foods or beverages. Contact your dietitian for more options. WHAT FOODS ARE NOT RECOMMENDED? Grains White bread. White pasta. White rice. Refined cornbread. Bagels and croissants. Crackers that contain trans fat. Vegetables Creamed or fried vegetables. Vegetables in a cheese sauce. Regular canned vegetables. Regular canned tomato sauce and paste. Regular tomato and vegetable juices. Fruits Dried fruits. Canned fruit in light or heavy syrup. Fruit juice. Meat and Other Protein Products Fatty cuts of meat. Ribs, chicken wings, bacon, sausage, bologna, salami, chitterlings, fatback, hot dogs, bratwurst, and packaged luncheon meats. Salted nuts and seeds. Canned beans with salt. Dairy Whole or 2% milk, cream, half-and-half, and cream cheese. Whole-fat or sweetened yogurt. Full-fat cheeses or blue cheese. Nondairy creamers and whipped toppings. Processed cheese, cheese spreads, or cheese curds. Condiments Onion and garlic salt, seasoned salt, table salt, and sea salt. Canned and packaged gravies. Worcestershire sauce. Tartar sauce. Barbecue sauce. Teriyaki sauce. Soy sauce, including reduced sodium. Steak  sauce. Fish sauce. Oyster sauce. Cocktail sauce. Horseradish. Ketchup and mustard. Meat flavorings and tenderizers. Bouillon cubes. Hot sauce. Tabasco sauce. Marinades. Taco seasonings. Relishes. Fats and Oils Butter, stick margarine, lard, shortening, ghee, and bacon fat. Coconut, palm kernel, or palm oils. Regular salad dressings. Other Pickles and olives. Salted popcorn and pretzels. The items listed above may not be a complete list of foods and beverages to avoid. Contact your dietitian for more information. WHERE CAN I FIND MORE INFORMATION? National Heart, Lung, and Blood Institute: travelstabloid.com Document Released: 04/06/2011 Document Revised:  09/01/2013 Document Reviewed: 02/19/2013 [employer] Patient Information 2015 [place], [place]. This information is not intended to replace advice given to you by your health care provider. Make sure you discuss any questions you have with your health care provider.

## 2014-12-18 ENCOUNTER — Telehealth: Payer: Self-pay

## 2014-12-18 NOTE — Telephone Encounter (Signed)
[facility] called and stated that his last colonoscopy was on 10/30/05. It was normal, no polyps, repeat in 10 years.

## 2014-12-19 DIAGNOSIS — Z0000 Encounter for general adult medical examination without abnormal findings: Secondary | ICD-10-CM | POA: Insufficient documentation

## 2014-12-19 DIAGNOSIS — Z87891 Personal history of nicotine dependence: Secondary | ICD-10-CM | POA: Insufficient documentation

## 2014-12-19 DIAGNOSIS — Z1211 Encounter for screening for malignant neoplasm of colon: Secondary | ICD-10-CM | POA: Insufficient documentation

## 2014-12-19 DIAGNOSIS — Z23 Encounter for immunization: Secondary | ICD-10-CM | POA: Insufficient documentation

## 2014-12-19 NOTE — Assessment & Plan Note (Signed)
EKG done at part of Welcome to Medicare visit; copy given to patient to keep with him; NSR, no abnormal ST-T wave changes

## 2014-12-19 NOTE — Assessment & Plan Note (Signed)
Refer to GI for colonoscopy.

## 2014-12-19 NOTE — Assessment & Plan Note (Signed)
Welcome to medicare visit performed; age-appropriate guidelines followed, tests ordered, return in 1 year for next medicare visit

## 2014-12-19 NOTE — Assessment & Plan Note (Signed)
PCV-13 recommended today (Prevnar) and given; no more PCV-13 vaccines recommended in his lifetime per current ACIP guidelines; next pneumonia vaccine will be the PPSV-23 (Pneumovax) in 6-12 months

## 2014-12-19 NOTE — Assessment & Plan Note (Signed)
[place] to screen for AAA, history of tobacco use

## 2014-12-23 ENCOUNTER — Other Ambulatory Visit: Payer: Self-pay | Admitting: Family Medicine

## 2014-12-23 NOTE — Telephone Encounter (Signed)
rx

## 2014-12-24 ENCOUNTER — Other Ambulatory Visit: Payer: Self-pay | Admitting: Family Medicine

## 2014-12-24 NOTE — Telephone Encounter (Signed)
Routing to provider  

## 2015-01-01 ENCOUNTER — Other Ambulatory Visit: Payer: Self-pay | Admitting: Family Medicine

## 2015-01-01 ENCOUNTER — Ambulatory Visit
Admission: RE | Admit: 2015-01-01 | Discharge: 2015-01-01 | Disposition: A | Discharge status: Home / Self Care | Payer: Commercial Managed Care - HMO | Source: Ambulatory Visit | Attending: Family Medicine | Admitting: Family Medicine

## 2015-01-01 DIAGNOSIS — Z87891 Personal history of nicotine dependence: Secondary | ICD-10-CM | POA: Diagnosis present

## 2015-01-01 DIAGNOSIS — Z136 Encounter for screening for cardiovascular disorders: Secondary | ICD-10-CM | POA: Insufficient documentation

## 2015-01-01 NOTE — Assessment & Plan Note (Signed)
Previous tobacco use; order retroperitoneal [place] complete per radiology scheduling

## 2015-03-18 ENCOUNTER — Ambulatory Visit (INDEPENDENT_AMBULATORY_CARE_PROVIDER_SITE_OTHER): Payer: Commercial Managed Care - HMO | Admitting: Family Medicine

## 2015-03-18 ENCOUNTER — Encounter: Payer: Self-pay | Admitting: Family Medicine

## 2015-03-18 VITALS — BP 150/91 | HR 90 | Temp 97.6°F | Wt 286.0 lb

## 2015-03-18 DIAGNOSIS — I10 Essential (primary) hypertension: Secondary | ICD-10-CM | POA: Diagnosis not present

## 2015-03-18 DIAGNOSIS — E781 Pure hyperglyceridemia: Secondary | ICD-10-CM

## 2015-03-18 DIAGNOSIS — F330 Major depressive disorder, recurrent, mild: Secondary | ICD-10-CM | POA: Diagnosis not present

## 2015-03-18 DIAGNOSIS — N400 Enlarged prostate without lower urinary tract symptoms: Secondary | ICD-10-CM | POA: Diagnosis not present

## 2015-03-18 DIAGNOSIS — M25562 Pain in left knee: Secondary | ICD-10-CM

## 2015-03-18 DIAGNOSIS — M25561 Pain in right knee: Secondary | ICD-10-CM | POA: Insufficient documentation

## 2015-03-18 DIAGNOSIS — R739 Hyperglycemia, unspecified: Secondary | ICD-10-CM

## 2015-03-18 DIAGNOSIS — Z5181 Encounter for therapeutic drug level monitoring: Secondary | ICD-10-CM

## 2015-03-18 DIAGNOSIS — Z79899 Other long term (current) drug therapy: Secondary | ICD-10-CM | POA: Diagnosis not present

## 2015-03-18 DIAGNOSIS — Z23 Encounter for immunization: Secondary | ICD-10-CM | POA: Diagnosis not present

## 2015-03-18 DIAGNOSIS — G4733 Obstructive sleep apnea (adult) (pediatric): Secondary | ICD-10-CM | POA: Insufficient documentation

## 2015-03-18 MED ORDER — BUPROPION HCL ER (SR) 150 MG PO TB12: ORAL_TABLET | ORAL | Status: DC

## 2015-03-18 MED ORDER — TRAMADOL HCL 50 MG PO TABS: 50.0000 mg | ORAL_TABLET | Freq: Four times a day (QID) | ORAL | Status: DC | PRN

## 2015-03-18 MED ORDER — TRAZODONE HCL 50 MG PO TABS: ORAL_TABLET | ORAL | Status: DC

## 2015-03-18 NOTE — Assessment & Plan Note (Addendum)
Check fasting lipids; work on weight loss; he does not want medicine unless absolutely necessary; healthy eating encouraged

## 2015-03-18 NOTE — Assessment & Plan Note (Signed)
Try to lose weight, limit empty calories, drink 64 ounces of water

## 2015-03-18 NOTE — Patient Instructions (Addendum)
You received the flu shot today; it should protect you against the flu virus over the coming months; it will take about two weeks for antibodies to develop; do try to stay away from hospitals, nursing homes, and daycares during peak flu season; taking vitamin C daily during flu season may help you avoid getting sick  Use the tramadol if needed; okay to take with 500 mg tylenol (acetaminophen) up to four times a day if needed  Your goal blood pressure is less than 150 mmHg on top. Try to follow the DASH guidelines (DASH stands for Dietary Approaches to Stop Hypertension) Try to limit the sodium in your diet.  Ideally, consume less than 1.5 grams (less than 1,500mg ) per day. Do not add salt when cooking or at the table.  Check the sodium amount on labels when shopping, and choose items lower in sodium when given a choice. Avoid or limit foods that already contain a lot of sodium. Eat a diet rich in fruits and vegetables and whole grains.  Return for fasting labs tomorrow, nothing to eat or drink with calories after midnight  Check out the information at familydoctor.org entitled "What It Takes to Lose Weight" Try to lose between 1-2 pounds per week by taking in fewer calories and burning off more calories You can succeed by limiting portions, limiting foods dense in calories and fat, becoming more active, and drinking 8 glasses of water a day (64 ounces) Don't skip meals, especially breakfast, as skipping meals may alter your metabolism Do not use over-the-counter weight loss pills or gimmicks that claim rapid weight loss A healthy BMI (or body mass index) is between 18.5 and 24.9 You can calculate your ideal BMI at the [employer] website ClubMonetize.fr  Decrease bupropion to just one pill once a day for three weeks and then stop Start trazodone at bedtime, one pill at night at first, may go up to two pills after the first week    DASH Eating  Plan DASH stands for "Dietary Approaches to Stop Hypertension." The DASH eating plan is a healthy eating plan that has been shown to reduce high blood pressure (hypertension). Additional health benefits may include reducing the risk of type 2 diabetes mellitus, heart disease, and stroke. The DASH eating plan may also help with weight loss. WHAT DO I NEED TO KNOW ABOUT THE DASH EATING PLAN? For the DASH eating plan, you will follow these general guidelines:  Choose foods with a percent daily value for sodium of less than 5% (as listed on the food label).  Use salt-free seasonings or herbs instead of table salt or sea salt.  Check with your health care provider or pharmacist before using salt substitutes.  Eat lower-sodium products, often labeled as "lower sodium" or "no salt added."  Eat fresh foods.  Eat more vegetables, fruits, and low-fat dairy products.  Choose whole grains. Look for the word "whole" as the first word in the ingredient list.  Choose fish and skinless chicken or [place] more often than red meat. Limit fish, poultry, and meat to 6 oz (170 g) each day.  Limit sweets, desserts, sugars, and sugary drinks.  Choose heart-healthy fats.  Limit cheese to 1 oz (28 g) per day.  Eat more home-cooked food and less restaurant, buffet, and fast food.  Limit fried foods.  Cook foods using methods other than frying.  Limit canned vegetables. If you do use them, rinse them well to decrease the sodium.  When eating at a restaurant, ask that  your food be prepared with less salt, or no salt if possible. WHAT FOODS CAN I EAT? Seek help from a dietitian for individual calorie needs. Grains Whole grain or whole wheat bread. Brown rice. Whole grain or whole wheat pasta. Quinoa, bulgur, and whole grain cereals. Low-sodium cereals. Corn or whole wheat flour tortillas. Whole grain cornbread. Whole grain crackers. Low-sodium crackers. Vegetables Fresh or frozen vegetables (raw, steamed,  roasted, or grilled). Low-sodium or reduced-sodium tomato and vegetable juices. Low-sodium or reduced-sodium tomato sauce and paste. Low-sodium or reduced-sodium canned vegetables.  Fruits All fresh, canned (in natural juice), or frozen fruits. Meat and Other Protein Products Ground beef (85% or leaner), grass-fed beef, or beef trimmed of fat. Skinless chicken or [place]. Ground chicken or [place]. Pork trimmed of fat. All fish and seafood. Eggs. Dried beans, peas, or lentils. Unsalted nuts and seeds. Unsalted canned beans. Dairy Low-fat dairy products, such as skim or 1% milk, 2% or reduced-fat cheeses, low-fat ricotta or cottage cheese, or plain low-fat yogurt. Low-sodium or reduced-sodium cheeses. Fats and Oils Tub margarines without trans fats. Light or reduced-fat mayonnaise and salad dressings (reduced sodium). Avocado. Safflower, olive, or canola oils. Natural peanut or almond butter. Other Unsalted popcorn and pretzels. The items listed above may not be a complete list of recommended foods or beverages. Contact your dietitian for more options. WHAT FOODS ARE NOT RECOMMENDED? Grains White bread. White pasta. White rice. Refined cornbread. Bagels and croissants. Crackers that contain trans fat. Vegetables Creamed or fried vegetables. Vegetables in a cheese sauce. Regular canned vegetables. Regular canned tomato sauce and paste. Regular tomato and vegetable juices. Fruits Dried fruits. Canned fruit in light or heavy syrup. Fruit juice. Meat and Other Protein Products Fatty cuts of meat. Ribs, chicken wings, bacon, sausage, bologna, salami, chitterlings, fatback, hot dogs, bratwurst, and packaged luncheon meats. Salted nuts and seeds. Canned beans with salt. Dairy Whole or 2% milk, cream, half-and-half, and cream cheese. Whole-fat or sweetened yogurt. Full-fat cheeses or blue cheese. Nondairy creamers and whipped toppings. Processed cheese, cheese spreads, or cheese curds. Condiments Onion  and garlic salt, seasoned salt, table salt, and sea salt. Canned and packaged gravies. Worcestershire sauce. Tartar sauce. Barbecue sauce. Teriyaki sauce. Soy sauce, including reduced sodium. Steak sauce. Fish sauce. Oyster sauce. Cocktail sauce. Horseradish. Ketchup and mustard. Meat flavorings and tenderizers. Bouillon cubes. Hot sauce. Tabasco sauce. Marinades. Taco seasonings. Relishes. Fats and Oils Butter, stick margarine, lard, shortening, ghee, and bacon fat. Coconut, palm kernel, or palm oils. Regular salad dressings. Other Pickles and olives. Salted popcorn and pretzels. The items listed above may not be a complete list of foods and beverages to avoid. Contact your dietitian for more information. WHERE CAN I FIND MORE INFORMATION? National Heart, Lung, and Blood Institute: travelstabloid.com   This information is not intended to replace advice given to you by your health care provider. Make sure you discuss any questions you have with your health care provider.   Document Released: 04/06/2011 Document Revised: 05/08/2014 Document Reviewed: 02/19/2013 Elsevier Interactive Patient Education [employer].

## 2015-03-18 NOTE — Assessment & Plan Note (Signed)
Try DASH guidelines; work on weight loss; he does not want medicine, will keep an eye on it

## 2015-03-18 NOTE — Assessment & Plan Note (Signed)
Okay to approve headgear when requested

## 2015-03-18 NOTE — Assessment & Plan Note (Addendum)
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

## 2015-03-18 NOTE — Progress Notes (Signed)
BP 150/91 mmHg  Pulse 90  Temp(Src) 97.6 F (36.4 C)  Wt 286 lb (129.729 kg)  SpO2 96%   Subjective:    Patient ID: Duane Hughes, male    DOB: 1951-04-11, 64 y.o.   MRN: [id number]  HPI: Duane Hughes is a 64 y.o. male  Chief Complaint  Patient presents with  . Hyperlipidemia  . Hypertension  . Obesity   Both knees have been real real bad; the left knee was replaced, but get so sore sometimes; sometimes playing golf; hard to walk sometimes; sometimes limps; his last orthopaedist was in [place]; his surgeries were in [place] and [place]; has not used anyone locally but would like to start seeing someone here; he has used ibuprofen, very old ultracet helps  Hypertension; checks BP twice a week with plasma, never high there; does not want to do medicine  High cholesterol; he only wants to take medicine if absolutely necessary; used to eat a lot of eggs, but lots less now; almond or coconut milk, no cow's milk; not sure if high cholesterol, but strong fam hx of stroke; he is not taking aspirin, will ask [employer] if he can  He has had a few times of mood issues and gotten back into negative thoughts; passive thoughts, just feels bad, no active thoughts; has been on bupropion for some time; do we want to do something else  He would like meds to [facility], he approves request when it comes to me  He is going to need a new mask and will need approval for headgear for his sleep apnea; still looking for a company;   Relevant past medical, surgical, family and social history reviewed and updated as indicated. Interim medical history since our last visit reviewed. Allergies and medications reviewed and updated.  Review of Systems  Per HPI unless specifically indicated above     Objective:    BP 150/91 mmHg  Pulse 90  Temp(Src) 97.6 F (36.4 C)  Wt 286 lb (129.729 kg)  SpO2 96%  Wt Readings from Last 3 Encounters:  03/18/15 286 lb (129.729 kg)  12/17/14  286 lb (129.729 kg)  11/11/14 278 lb (126.1 kg)   body mass index is 43.5 kg/(m^2).  Physical Exam  Constitutional: He appears well-developed and well-nourished. No distress.  Morbidly obese  HENT:  Head: Normocephalic and atraumatic.  Eyes: EOM are normal. No scleral icterus.  Neck: No thyromegaly present.  Cardiovascular: Normal rate and regular rhythm.   Pulmonary/Chest: Effort normal and breath sounds normal.  Abdominal: Soft. Bowel sounds are normal. He exhibits no distension.  Musculoskeletal: He exhibits no edema.       Right knee: He exhibits decreased range of motion and swelling.       Left knee: He exhibits decreased range of motion and swelling.  Crepitus both knees  Neurological: Coordination normal.  Skin: Skin is warm and dry. No pallor.  Psychiatric: He has a normal mood and affect. His behavior is normal. Judgment and thought content normal.    Crepitus on both knees  Results for orders placed or performed in visit on 11/11/14  PSA  Result Value Ref Range   Prostate Specific Ag, Serum 0.8 0.0 - 4.0 ng/mL  CBC with Differential/Platelet  Result Value Ref Range   WBC 7.2 3.4 - 10.8 x10E3/uL   RBC 4.81 4.14 - 5.80 x10E6/uL   Hemoglobin 14.3 12.6 - 17.7 g/dL   Hematocrit 43.5 37.5 - 51.0 %   MCV  90 79 - 97 fL   MCH 29.7 26.6 - 33.0 pg   MCHC 32.9 31.5 - 35.7 g/dL   RDW 14.1 12.3 - 15.4 %   Platelets 293 150 - 379 x10E3/uL   Neutrophils 60 %   Lymphs 28 %   Monocytes 8 %   Eos 3 %   Basos 1 %   Neutrophils Absolute 4.3 1.4 - 7.0 x10E3/uL   Lymphocytes Absolute 2.0 0.7 - 3.1 x10E3/uL   Monocytes Absolute 0.6 0.1 - 0.9 x10E3/uL   EOS (ABSOLUTE) 0.2 0.0 - 0.4 x10E3/uL   Basophils Absolute 0.0 0.0 - 0.2 x10E3/uL   Immature Granulocytes 0 %   Immature Grans (Abs) 0.0 0.0 - 0.1 x10E3/uL  UA/M w/rflx Culture, Routine  Result Value Ref Range   Specific Gravity, UA 1.025 1.005 - 1.030   pH, UA 6.0 5.0 - 7.5   Color, UA Yellow Yellow   Appearance Ur Clear  Clear   Leukocytes, UA Negative Negative   Protein, UA Negative Negative/Trace   Glucose, UA Negative Negative   Ketones, UA Negative Negative   RBC, UA Negative Negative   Bilirubin, UA Negative Negative   Urobilinogen, Ur 1.0 0.2 - 1.0 mg/dL   Nitrite, UA Negative Negative  Lipid Panel w/o Chol/HDL Ratio  Result Value Ref Range   Cholesterol, Total 197 100 - 199 mg/dL   Triglycerides 281 (H) 0 - 149 mg/dL   HDL 40 >39 mg/dL   VLDL Cholesterol Cal 56 (H) 5 - 40 mg/dL   LDL Calculated 101 (H) 0 - 99 mg/dL  Comprehensive metabolic panel  Result Value Ref Range   Glucose 115 (H) 65 - 99 mg/dL   BUN 16 8 - 27 mg/dL   Creatinine, Ser 1.02 0.76 - 1.27 mg/dL   GFR calc non Af Amer 77 >59 mL/min/1.73   GFR calc Af Amer 89 >59 mL/min/1.73   BUN/Creatinine Ratio 16 10 - 22   Sodium 144 134 - 144 mmol/L   Potassium 4.5 3.5 - 5.2 mmol/L   Chloride 100 97 - 108 mmol/L   CO2 22 18 - 29 mmol/L   Calcium 9.7 8.6 - 10.2 mg/dL   Total Protein 7.5 6.0 - 8.5 g/dL   Albumin 4.4 3.6 - 4.8 g/dL   Globulin, Total 3.1 1.5 - 4.5 g/dL   Albumin/Globulin Ratio 1.4 1.1 - 2.5   Bilirubin Total 0.3 0.0 - 1.2 mg/dL   Alkaline Phosphatase 71 39 - 117 IU/L   AST 40 0 - 40 IU/L   ALT 43 0 - 44 IU/L  POC Hemoccult Bld/Stl (1-Cd Office Dx)  Result Value Ref Range   Card #1 Date     Fecal Occult Blood, POC Negative Negative      Assessment & Plan:   Problem List Items Addressed This Visit      Cardiovascular and Mediastinum   Benign hypertension - Primary    Try DASH guidelines; work on weight loss; he does not want medicine, will keep an eye on it        Respiratory   Obstructive sleep apnea    Okay to approve headgear when requested        Other   Morbid obesity (HCC)    Try to lose weight, limit empty calories, drink 64 ounces of water      Major depressive disorder, recurrent ([place])    Will have him taper down on the bupropion and use trazodone since he is having issues with sleep at  night      Relevant Medications   buPROPion (WELLBUTRIN SR) 150 MG 12 hr tablet   traZODone (DESYREL) 50 MG tablet   Enlarged prostate    Check PSA; he is not seeing urologist locally      Relevant Orders   PSA   Hypertriglyceridemia    Check fasting lipids; work on weight loss; he does not want medicine unless absolutely necessary; healthy eating encouraged      Relevant Orders   Lipid Panel w/o Chol/HDL Ratio   Need for prophylactic vaccination against Streptococcus pneumoniae (pneumococcus)    Offered and given today; he will not need a booster for the rest of his life per current ACIP guidelines      Knee pain, bilateral    Refer to orthopaedist; 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; pain limits activity; Rx given for pain medicine today, cautions given regarding tramadol      Controlled substance agreement signed    Controlled substance contract signed today by patient, typical speech given; warned about serotonin syndrome, reasons to seek help right away with the tramadol plus antidepressants      Hyperglycemia    Check A1C; work on weight loss      Relevant Orders   Hgb A1c w/o eAG   Medication monitoring encounter    Check cmp today; last CBC was normal so not repeated today; reviewed (done 11/11/14)      Relevant Orders   Comprehensive metabolic panel   Needs flu shot    Flu vaccine offered and given       Other Visit Diagnoses    Encounter for immunization            Follow up plan: Return in about 4 weeks (around 04/15/2015) for medicine changes.  An after-visit summary was printed and given to the patient at [place].  Please see the patient instructions which may contain other information and recommendations beyond what is mentioned above in the assessment and plan.  Orders Placed This Encounter  Procedures  . Flu Vaccine QUAD 36+ mos IM  . PSA  . Comprehensive metabolic panel  . Lipid Panel w/o Chol/HDL Ratio  . Hgb A1c w/o eAG

## 2015-03-19 ENCOUNTER — Other Ambulatory Visit: Payer: Commercial Managed Care - HMO

## 2015-03-19 DIAGNOSIS — Z23 Encounter for immunization: Secondary | ICD-10-CM | POA: Insufficient documentation

## 2015-03-19 DIAGNOSIS — E1169 Type 2 diabetes mellitus with other specified complication: Secondary | ICD-10-CM | POA: Insufficient documentation

## 2015-03-19 DIAGNOSIS — Z5181 Encounter for therapeutic drug level monitoring: Secondary | ICD-10-CM | POA: Insufficient documentation

## 2015-03-19 DIAGNOSIS — E669 Obesity, unspecified: Secondary | ICD-10-CM

## 2015-03-19 NOTE — Assessment & Plan Note (Addendum)
Check PSA; he is not seeing urologist locally

## 2015-03-19 NOTE — Assessment & Plan Note (Signed)
Will have him taper down on the bupropion and use trazodone since he is having issues with sleep at night

## 2015-03-19 NOTE — Assessment & Plan Note (Signed)
Controlled substance contract signed today by patient, typical speech given; warned about serotonin syndrome, reasons to seek help right away with the tramadol plus antidepressants

## 2015-03-19 NOTE — Assessment & Plan Note (Signed)
Check A1C; work on weight loss

## 2015-03-19 NOTE — Assessment & Plan Note (Signed)
Flu vaccine offered and given

## 2015-03-19 NOTE — Assessment & Plan Note (Signed)
Check cmp today; last CBC was normal so not repeated today; reviewed (done 11/11/14)

## 2015-03-19 NOTE — Assessment & Plan Note (Signed)
Offered and given today; he will not need a booster for the rest of his life per current ACIP guidelines

## 2015-03-20 LAB — COMPREHENSIVE METABOLIC PANEL
ALBUMIN: 4 g/dL (ref 3.6–4.8)
ALK PHOS: 51 IU/L (ref 39–117)
ALT: 42 IU/L (ref 0–44)
AST: 39 IU/L (ref 0–40)
Albumin/Globulin Ratio: 1.8 (ref 1.1–2.5)
BUN / CREAT RATIO: 14 (ref 10–22)
BUN: 14 mg/dL (ref 8–27)
Bilirubin Total: 0.5 mg/dL (ref 0.0–1.2)
CALCIUM: 9 mg/dL (ref 8.6–10.2)
CO2: 25 mmol/L (ref 18–29)
CREATININE: 1.02 mg/dL (ref 0.76–1.27)
Chloride: 99 mmol/L (ref 97–106)
GFR calc Af Amer: 89 mL/min/{1.73_m2} (ref >59)
GFR, EST NON AFRICAN AMERICAN: 77 mL/min/{1.73_m2} (ref >59)
GLOBULIN, TOTAL: 2.2 g/dL (ref 1.5–4.5)
GLUCOSE: 129 mg/dL — AB (ref 65–99)
Potassium: 4.6 mmol/L (ref 3.5–5.2)
SODIUM: 138 mmol/L (ref 136–144)
Total Protein: 6.2 g/dL (ref 6.0–8.5)

## 2015-03-20 LAB — PSA: Prostate Specific Ag, Serum: 0.8 ng/mL (ref 0.0–4.0)

## 2015-03-20 LAB — LIPID PANEL W/O CHOL/HDL RATIO
CHOLESTEROL TOTAL: 187 mg/dL (ref 100–199)
HDL: 41 mg/dL (ref >39)
LDL CALC: 111 mg/dL — AB (ref 0–99)
TRIGLYCERIDES: 175 mg/dL — AB (ref 0–149)
VLDL CHOLESTEROL CAL: 35 mg/dL (ref 5–40)

## 2015-03-20 LAB — HGB A1C W/O EAG: HEMOGLOBIN A1C: 7.3 % — AB (ref 4.8–5.6)

## 2015-03-30 ENCOUNTER — Telehealth: Payer: Self-pay | Admitting: Family Medicine

## 2015-03-30 NOTE — Telephone Encounter (Signed)
I called, but patient is driving; I'll talk to him tomorrow (I need to address his rising blood sugar)

## 2015-04-01 MED ORDER — BUPROPION HCL ER (SR) 150 MG PO TB12: 150.0000 mg | ORAL_TABLET | Freq: Two times a day (BID) | ORAL | Status: DC

## 2015-04-01 NOTE — Telephone Encounter (Signed)
Routing to provider  

## 2015-04-01 NOTE — Telephone Encounter (Signed)
I spoke with patient; he has noticed confusion from the medicine; he doesn't know if it's coming off of the bupropion He had a flare on Tuesday Nothing like stroke or TIA; just knee problem with knee He is forgetting stuff; not sharp; walks without glasses, leaving keys in places he doesn't usually leave them in; just not sharp; making stupid errors He has been taking trazodone every night; he really doesn't think any stroke or TIA Go back up to 150 mg BID of the bupropion and make sure he is back to baseline in five days, or we'll get head scan; he agrees Do NOT drive if confused, not able to operate machinery Explained his sugar is high; I want to double-check before I diagnose him with diabetes; he agrees; explained high sugars can cause confusion too He'll come in Wednesday at 2:45 for recheck and labs I am on-call all week and weekend, so have me paged if something I can help him with before next appt

## 2015-04-01 NOTE — Telephone Encounter (Signed)
traZODone (DESYREL) 50 MG tablet The medication above is not working in patient words. He states his not feeling well , not good thought, forgetting things, space out. He would like for Dr. Sanda Klein to call him back asap. [phone]

## 2015-04-07 ENCOUNTER — Ambulatory Visit (INDEPENDENT_AMBULATORY_CARE_PROVIDER_SITE_OTHER): Payer: Commercial Managed Care - HMO | Admitting: Family Medicine

## 2015-04-07 ENCOUNTER — Encounter: Payer: Self-pay | Admitting: Family Medicine

## 2015-04-07 VITALS — BP 146/90 | HR 87 | Temp 98.6°F | Wt 281.0 lb

## 2015-04-07 DIAGNOSIS — R739 Hyperglycemia, unspecified: Secondary | ICD-10-CM

## 2015-04-07 DIAGNOSIS — T887XXA Unspecified adverse effect of drug or medicament, initial encounter: Secondary | ICD-10-CM | POA: Diagnosis not present

## 2015-04-07 DIAGNOSIS — T50905A Adverse effect of unspecified drugs, medicaments and biological substances, initial encounter: Secondary | ICD-10-CM

## 2015-04-07 LAB — BAYER DCA HB A1C WAIVED: HB A1C: 7 % — AB (ref <7.0)

## 2015-04-07 NOTE — Patient Instructions (Addendum)
We'll let you know what the labs show  Hyperglycemia Hyperglycemia occurs when the glucose (sugar) in your blood is too high. Hyperglycemia can happen for many reasons, but it most often happens to people who do not know they have diabetes or are not managing their diabetes properly.  CAUSES  Whether you have diabetes or not, there are other causes of hyperglycemia. Hyperglycemia can occur when you have diabetes, but it can also occur in other situations that you might not be as aware of, such as: Diabetes  If you have diabetes and are having problems controlling your blood glucose, hyperglycemia could occur because of some of the following reasons:  Not following your meal plan.  Not taking your diabetes medications or not taking it properly.  Exercising less or doing less activity than you normally do.  Being sick. Pre-diabetes  This cannot be ignored. Before people develop Type 2 diabetes, they almost always have "pre-diabetes." This is when your blood glucose levels are higher than normal, but not yet high enough to be diagnosed as diabetes. Research has shown that some long-term damage to the body, especially the heart and circulatory system, may already be occurring during pre-diabetes. If you take action to manage your blood glucose when you have pre-diabetes, you may delay or prevent Type 2 diabetes from developing. Stress  If you have diabetes, you may be "diet" controlled or on oral medications or insulin to control your diabetes. However, you may find that your blood glucose is higher than usual in the hospital whether you have diabetes or not. This is often referred to as "stress hyperglycemia." Stress can elevate your blood glucose. This happens because of hormones put out by the body during times of stress. If stress has been the cause of your high blood glucose, it can be followed regularly by your caregiver. That way he/she can make sure your hyperglycemia does not continue to  get worse or progress to diabetes. Steroids  Steroids are medications that act on the infection fighting system (immune system) to block inflammation or infection. One side effect can be a rise in blood glucose. Most people can produce enough extra insulin to allow for this rise, but for those who cannot, steroids make blood glucose levels go even higher. It is not unusual for steroid treatments to "uncover" diabetes that is developing. It is not always possible to determine if the hyperglycemia will go away after the steroids are stopped. A special blood test called an A1c is sometimes done to determine if your blood glucose was elevated before the steroids were started. SYMPTOMS  Thirsty.  Frequent urination.  Dry mouth.  Blurred vision.  Tired or fatigue.  Weakness.  Sleepy.  Tingling in feet or leg. DIAGNOSIS  Diagnosis is made by monitoring blood glucose in one or all of the following ways:  A1c test. This is a chemical found in your blood.  Fingerstick blood glucose monitoring.  Laboratory results. TREATMENT  First, knowing the cause of the hyperglycemia is important before the hyperglycemia can be treated. Treatment may include, but is not be limited to:  Education.  Change or adjustment in medications.  Change or adjustment in meal plan.  Treatment for an illness, infection, etc.  More frequent blood glucose monitoring.  Change in exercise plan.  Decreasing or stopping steroids.  Lifestyle changes. HOME CARE INSTRUCTIONS   Test your blood glucose as directed.  Exercise regularly. Your caregiver will give you instructions about exercise. Pre-diabetes or diabetes which comes on  with stress is helped by exercising.  Eat wholesome, balanced meals. Eat often and at regular, fixed times. Your caregiver or nutritionist will give you a meal plan to guide your sugar intake.  Being at an ideal weight is important. If needed, losing as little as 10 to 15 pounds may  help improve blood glucose levels. SEEK MEDICAL CARE IF:   You have questions about medicine, activity, or diet.  You continue to have symptoms (problems such as increased thirst, urination, or weight gain). SEEK IMMEDIATE MEDICAL CARE IF:   You are vomiting or have diarrhea.  Your breath smells fruity.  You are breathing faster or slower.  You are very sleepy or incoherent.  You have numbness, tingling, or pain in your feet or hands.  You have chest pain.  Your symptoms get worse even though you have been following your caregiver's orders.  If you have any other questions or concerns.   This information is not intended to replace advice given to you by your health care provider. Make sure you discuss any questions you have with your health care provider.   Document Released: 10/11/2000 Document Revised: 07/10/2011 Document Reviewed: 12/22/2014 Elsevier Interactive Patient Education [employer].

## 2015-04-07 NOTE — Assessment & Plan Note (Addendum)
Recheck A1c today; presumably diabetes but will confirm; weight loss is key

## 2015-04-07 NOTE — Progress Notes (Signed)
  BP 146/90 mmHg  Pulse 87  Temp(Src) 98.6 F (37 C)  Wt 281 lb (127.461 kg)  SpO2 95%   Subjective:    Patient ID: Duane Hughes, male    DOB: [birth year], 64 y.o.   MRN: [id number]  HPI: Duane Hughes is a 64 y.o. male  Chief Complaint  Patient presents with  . Follow-up    doing better since stopping the trazadone  . Hyperglycemia    you had mentioned he had high sugar   He stopped the trazodone and could tell an immediate difference No signs/symptoms of stroke Thinks short-term memory is better; "it was nasty" when he was on the medicine; it is much better, getting back to normal Benadryl jacks him up He thinks he was taking the bupropion both pills in the morning; now taking twice a day  He stopped the sugar when I talked to him about the likelihood of diabetes; he was eating donuts; no sugary drinks; he had one elevated reading and we are going to recheck that today  He has not called the orthopaedic surgeon yet; he recalls my controlled substance speech; he has had to take some, per instructions; played golf today but less pain this morning  Relevant past medical, surgical, family and social history reviewed and updated as indicated. Interim medical history since our last visit reviewed. Allergies and medications reviewed and updated.  Review of Systems  Musculoskeletal: Positive for arthralgias (left knee hurts all the time).  Neurological: Negative for numbness.  Per HPI unless specifically indicated above     Objective:    BP 146/90 mmHg  Pulse 87  Temp(Src) 98.6 F (37 C)  Wt 281 lb (127.461 kg)  SpO2 95%    Physical Exam  Constitutional: He appears well-developed and well-nourished.  HENT:  Head: Normocephalic and atraumatic.  Mouth/Throat: Mucous membranes are not dry.  Eyes: EOM are normal. No scleral icterus.  Cardiovascular: Normal rate and regular rhythm.   Pulmonary/Chest: Effort normal and breath sounds normal.  Psychiatric: He has a normal  mood and affect.      Assessment & Plan:   Problem List Items Addressed This Visit      Endocrine   Diabetes mellitus without complication ([place])    Recheck A1c today; presumably diabetes but will confirm; weight loss is key        Other   Drug side effects - Primary    symptoms have resolved since cessation of the trazodone; will not use that again, obviously         Follow up plan: No Follow-up on file.  Orders Placed This Encounter  Procedures  . Bayer DCA Hb A1c Waived

## 2015-04-09 ENCOUNTER — Other Ambulatory Visit: Payer: Self-pay

## 2015-04-09 ENCOUNTER — Telehealth: Payer: Self-pay | Admitting: Family Medicine

## 2015-04-09 NOTE — Telephone Encounter (Signed)
Pt has questions about CPAP equipment. Please call him ASAP. Thanks.

## 2015-04-09 NOTE — Telephone Encounter (Signed)
He needs to get documentation to buy a strap for his CPAP. He is going to have his prior PCP, Dr. Ronnald Ramp, send [place] his prior CPAP info and he will bring his paperwork that he has to his appointment here on Thursday.

## 2015-04-09 NOTE — Telephone Encounter (Signed)
Patient was busy, he stated he'd call back.

## 2015-04-13 ENCOUNTER — Telehealth: Payer: Self-pay | Admitting: Family Medicine

## 2015-04-13 DIAGNOSIS — E119 Type 2 diabetes mellitus without complications: Secondary | ICD-10-CM

## 2015-04-13 NOTE — Telephone Encounter (Signed)
I spoke with patient briefly; he is at work; he has an appt on Thursday so we'll go over lab then (A1c)

## 2015-04-15 ENCOUNTER — Ambulatory Visit (INDEPENDENT_AMBULATORY_CARE_PROVIDER_SITE_OTHER): Payer: Commercial Managed Care - HMO | Admitting: Family Medicine

## 2015-04-15 ENCOUNTER — Encounter: Payer: Self-pay | Admitting: Family Medicine

## 2015-04-15 VITALS — BP 146/88 | HR 92 | Wt 282.0 lb

## 2015-04-15 DIAGNOSIS — G4733 Obstructive sleep apnea (adult) (pediatric): Secondary | ICD-10-CM | POA: Diagnosis not present

## 2015-04-15 DIAGNOSIS — E119 Type 2 diabetes mellitus without complications: Secondary | ICD-10-CM | POA: Diagnosis not present

## 2015-04-15 DIAGNOSIS — F458 Other somatoform disorders: Secondary | ICD-10-CM | POA: Diagnosis not present

## 2015-04-15 DIAGNOSIS — I10 Essential (primary) hypertension: Secondary | ICD-10-CM | POA: Diagnosis not present

## 2015-04-15 MED ORDER — ASPIRIN EC 81 MG PO TBEC: 81.0000 mg | DELAYED_RELEASE_TABLET | Freq: Every day | ORAL | Status: DC

## 2015-04-15 MED ORDER — LISINOPRIL 5 MG PO TABS: 5.0000 mg | ORAL_TABLET | Freq: Every day | ORAL | Status: DC

## 2015-04-15 MED ORDER — METFORMIN HCL 500 MG PO TABS: 500.0000 mg | ORAL_TABLET | Freq: Two times a day (BID) | ORAL | Status: DC

## 2015-04-15 NOTE — Progress Notes (Signed)
BP 146/88 mmHg  Pulse 92  Wt 282 lb (127.914 kg)  SpO2 95%   Subjective:    Patient ID: Duane Hughes, male    DOB: Sep 16, 1950, 64 y.o.   MRN: [id number]  HPI: Duane Hughes is a 64 y.o. male  Chief Complaint  Patient presents with  . Sleep Apnea    he needs an order for a CPAP strap  . Discuss Lab Results    go over labs from the other week   Patient is here to go over his A1c and to discuss his new diagnosis of diabetes He does like pie and candy and chocolate He has been eating less bread He used to do bagels regularly but stopped buying them Really does not do much bread lately Does eat rice and pasta; brown rice; not white pasta Not hot drinks; drinks diet drinks or energy drinks no sugar added He is not as active as he could be; rides a cart when playing golf No one in the family with diabetes  OSA; face-to-face, diagnosed about 30 years ago; using CPAP every night; wakes up in the morning refreshed No surgery for airway obstruction Not sure about OSA in the family Since last sleep study, weight has gone up but less than 30 pounds Neck circumference 19" today Satisfied with use of CPAP He would like someone to "dial it down a hair" though Needs occlusal but that broke and wonders if it can tied in medically for sleep apnea, grinds his teeth, has had TMJ  The viagra works but not on a full stomach he has noticed He asked about stopping the bupropion or venlaxafine, if that matters The desire is there, no not a problem with libido  Relevant past medical, surgical, family and social history reviewed and updated as indicated. Interim medical history since our last visit reviewed. Allergies and medications reviewed and updated.  Review of Systems Per HPI unless specifically indicated above     Objective:    BP 146/88 mmHg  Pulse 92  Wt 282 lb (127.914 kg)  SpO2 95%  Wt Readings from Last 3 Encounters:  04/15/15 282 lb (127.914 kg)  04/07/15 281 lb  (127.461 kg)  03/18/15 286 lb (129.729 kg)  body mass index is 42.89 kg/(m^2).  Physical Exam  Constitutional: He appears well-developed and well-nourished.  Morbidly obese, but down 4 pounds in the last 4 weeks  Eyes: No scleral icterus.  Neck: No JVD present.  Neck circumference 19"  Cardiovascular: Normal rate and regular rhythm.   Pulmonary/Chest: Effort normal and breath sounds normal.  Abdominal: He exhibits no distension.  Musculoskeletal: He exhibits no edema.  Neurological: He is alert.  Skin: Skin is warm. No pallor.  Psychiatric: He has a normal mood and affect.   Diabetic Foot Form - Detailed   Diabetic Foot Exam - detailed  Diabetic Foot exam was performed with the following findings:  Yes 04/15/2015  9:40 PM  Visual Foot Exam completed.:  Yes  Are the shoes appropriate in style and fit?:  Yes  Is there swelling or and abnormal foot shape?:  No  Are the toenails long?:  No  Are the toenails thick?:  No  Are the toenails ingrown?:  No    Pulse Foot Exam completed.:  Yes  Right Dorsalis Pedis:  Present Left Dorsalis Pedis:  Present  Sensory Foot Exam Completed.:  Yes  Semmes-Weinstein Monofilament Test  R Site 1-Great Toe:  Pos L Site 1-Great Toe:  Pos  R Site 4:  Pos L Site 4:  Pos  R Site 5:  Pos L Site 5:  Pos       Results for orders placed or performed in visit on 04/07/15  Bayer DCA Hb A1c Waived  Result Value Ref Range   Bayer DCA Hb A1c Waived 7.0 (H) <7.0 %      Assessment & Plan:   Problem List Items Addressed This Visit      Cardiovascular and Mediastinum   Benign hypertension    Now that he has type 2 diabetes, start low-dose ACE-I for both blood presrure control and renal protection; DASH guidelines and weight loss would also help      Relevant Medications   lisinopril (PRINIVIL,ZESTRIL) 5 MG tablet   aspirin EC 81 MG tablet     Respiratory   Obstructive sleep apnea   Relevant Orders   Ambulatory referral to ENT     Digestive    Bruxism (teeth grinding)    Occlusal; refer to dentistry      Relevant Orders   Ambulatory referral to Dentistry   Ambulatory referral to ENT     Endocrine   Diabetes mellitus without complication ([place]) - Primary    Explained new diagnosis of diabetes mellitus; he meets criteria and does in fact have diabetes; tart ACE-I and metformin; will refer to diabetic educator; weight loss is so important for getting sugars under control; close f/u      Relevant Medications   metFORMIN (GLUCOPHAGE) 500 MG tablet   lisinopril (PRINIVIL,ZESTRIL) 5 MG tablet   aspirin EC 81 MG tablet     Other   Morbid obesity ([place])    Weight loss so important; glad to see he has lost a few pounds; diet and activity discussed      Relevant Medications   metFORMIN (GLUCOPHAGE) 500 MG tablet      Follow up plan: Return in about 4 weeks (around 05/13/2015) for diabetes follow-up, 30 minute visit.  An after-visit summary was printed and given to the patient at [place].  Please see the patient instructions which may contain other information and recommendations beyond what is mentioned above in the assessment and plan. Face-to-face time with patient was more than 25 minutes, >50% time spent counseling and coordination of care  Meds ordered this encounter  Medications  . metFORMIN (GLUCOPHAGE) 500 MG tablet    Sig: Take 1 tablet (500 mg total) by mouth 2 (two) times daily with a meal.    Dispense:  60 tablet    Refill:  3  . lisinopril (PRINIVIL,ZESTRIL) 5 MG tablet    Sig: Take 1 tablet (5 mg total) by mouth daily.    Dispense:  90 tablet    Refill:  3  . aspirin EC 81 MG tablet    Sig: Take 1 tablet (81 mg total) by mouth daily.    Dispense:  30 tablet    Refill:  11

## 2015-04-15 NOTE — Patient Instructions (Addendum)
Start the new medicine for diabetes (metformin) to help regulate your blood sugar Start the new medicine for blood pressure and kidney protection (lisinopril) Never stop the venlafaxine abruptly, but it would be okay to stop the bupropion and then call with update We'll refer you to the diabetic educator and dentist

## 2015-04-15 NOTE — Assessment & Plan Note (Addendum)
Explained new diagnosis of diabetes mellitus; he meets criteria and does in fact have diabetes; tart ACE-I and metformin; will refer to diabetic educator; weight loss is so important for getting sugars under control; close f/u

## 2015-04-21 ENCOUNTER — Telehealth: Payer: Self-pay

## 2015-04-21 DIAGNOSIS — E119 Type 2 diabetes mellitus without complications: Secondary | ICD-10-CM | POA: Insufficient documentation

## 2015-04-21 NOTE — Telephone Encounter (Signed)
Yes, referral entered; thank you

## 2015-04-21 NOTE — Telephone Encounter (Signed)
Patient thought he was supposed to get a referral to the [facility] for DM education, but there is no referral in his chart. I see it noted in his progress note. Can you put int he referral for him? I advised patient that they will call him to set up the appointment.

## 2015-05-01 NOTE — Assessment & Plan Note (Signed)
Weight loss so important; glad to see he has lost a few pounds; diet and activity discussed

## 2015-05-01 NOTE — Assessment & Plan Note (Addendum)
Occlusal; refer to dentistry

## 2015-05-01 NOTE — Assessment & Plan Note (Signed)
symptoms have resolved since cessation of the trazodone; will not use that again, obviously

## 2015-05-01 NOTE — Assessment & Plan Note (Signed)
Now that he has type 2 diabetes, start low-dose ACE-I for both blood presrure control and renal protection; DASH guidelines and weight loss would also help

## 2015-05-12 ENCOUNTER — Encounter: Payer: Self-pay | Admitting: Family Medicine

## 2015-05-12 ENCOUNTER — Ambulatory Visit (INDEPENDENT_AMBULATORY_CARE_PROVIDER_SITE_OTHER): Payer: Medicare HMO | Admitting: Family Medicine

## 2015-05-12 VITALS — BP 142/87 | HR 97 | Wt 273.0 lb

## 2015-05-12 DIAGNOSIS — R69 Illness, unspecified: Secondary | ICD-10-CM | POA: Diagnosis not present

## 2015-05-12 DIAGNOSIS — E119 Type 2 diabetes mellitus without complications: Secondary | ICD-10-CM | POA: Diagnosis not present

## 2015-05-12 DIAGNOSIS — R6889 Other general symptoms and signs: Secondary | ICD-10-CM

## 2015-05-12 DIAGNOSIS — R3911 Hesitancy of micturition: Secondary | ICD-10-CM | POA: Diagnosis not present

## 2015-05-12 DIAGNOSIS — N400 Enlarged prostate without lower urinary tract symptoms: Secondary | ICD-10-CM

## 2015-05-12 DIAGNOSIS — F458 Other somatoform disorders: Secondary | ICD-10-CM | POA: Diagnosis not present

## 2015-05-12 DIAGNOSIS — I10 Essential (primary) hypertension: Secondary | ICD-10-CM | POA: Diagnosis not present

## 2015-05-12 NOTE — Assessment & Plan Note (Addendum)
Refer to diabetic educator; foot exam by MD; explained that moderate-intensity statin recommended and he chose to read about this first; continue aspirin; work on weight loss, healthy eating; examine feet nightly, etc; discussed checking FSBS helpful during the first 6 months after diagnosis of diabetes, but later in disease, won't be as useful (ACE Best Practices guideline); aspirin; suggested cinnamon

## 2015-05-12 NOTE — Progress Notes (Signed)
BP 142/87 mmHg  Pulse 97  Wt 273 lb (123.832 kg)  SpO2 95%   Subjective:    Patient ID: Duane Hughes, male    DOB: 1950/11/25, 65 y.o.   MRN: [id number]  HPI: [name] is a 65 y.o. male  Chief Complaint  Patient presents with  . Diabetes    4 week recheck  . Hypertension    he was started on Lisinopril at last appointment.   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, now winded way more than he should be over the last few years; he joined the gym for a while, played in the pool, less hard on the joints; he has to watch his heart rate; he has to really be careful  Depression has passed he says  He has been urinating more; prostate keeps getting bigger and putting pressure; hard time breathing when trying to urinate; really weird; relieves himself and it goes back to normal; totally weird; like a shortness of breath; does not want to return to [place]; trouble getting stream started and dribbles at the end; not really strong stream; hurts with urination, painful; no blood  He has noticed that his skin is cold to the touch for [facility]; patient is not uncomfortable; legs do not turn cold or blue; she is warm to the touch; he is eating less, patient is not uncomfortable  He asked about occlusal; we reviewed previous referral entry  Relevant past medical, surgical, family and social history reviewed and updated as indicated. Interim medical history since our last visit reviewed. Allergies and medications reviewed and updated.  Review of Systems Per HPI unless specifically indicated above     Objective:    BP 142/87 mmHg  Pulse 97  Wt  273 lb (123.832 kg)  SpO2 95%  Wt Readings from Last 3 Encounters:  05/17/15 274 lb 8 oz (124.512 kg)  05/12/15 273 lb (123.832 kg)  04/15/15 282 lb (127.914 kg)    Physical Exam  Constitutional: He appears well-developed and well-nourished. No distress.  HENT:  Head: Normocephalic and atraumatic.  Eyes: EOM are normal. No scleral icterus.  Neck: No thyromegaly present.  Cardiovascular: Normal rate and regular rhythm.   Pulmonary/Chest: Effort normal and breath sounds normal.  Abdominal: Soft. Bowel sounds are normal. He exhibits no distension.  Musculoskeletal: He exhibits no edema.  Neurological: Coordination normal.  Skin: Skin is warm and dry. No pallor.  Psychiatric: He has a normal mood and affect. His behavior is normal. Judgment and thought content normal.   Diabetic Foot Form - Detailed   Diabetic Foot Exam - detailed  Diabetic Foot exam was performed with the following findings:  Yes 05/12/2015  8:46 PM  Visual Foot Exam completed.:  Yes  Are the toenails long?:  No  Are the toenails thick?:  No  Are the toenails ingrown?:  No    Pulse Foot Exam completed.:  Yes  Right Dorsalis Pedis:  Present Left Dorsalis Pedis:  Present  Sensory Foot Exam Completed.:  Yes  Swelling:  No  Semmes-Weinstein Monofilament Test  R Site 1-Great Toe:  Pos L Site 1-Great Toe:  Pos  R Site 4:  Pos L Site 4:  Pos  R Site 5:  Pos L Site 5:  Pos          Assessment & Plan:   Problem List Items Addressed This Visit      Cardiovascular and Mediastinum   Benign hypertension    Goal systolic less than XX123456 mmHg; ACE-I inhibitor, weight loss, DASH guidelines        Digestive   Bruxism (teeth grinding)    Reviewed occlusal referral        Endocrine   Diabetes mellitus without complication ([place]) - Primary    Refer to diabetic educator; foot exam by MD; explained that moderate-intensity statin recommended and he chose to read about this first; continue aspirin; work on weight loss,  healthy eating; examine feet nightly, etc; discussed checking FSBS helpful during the first 6 months after diagnosis of diabetes, but later in disease, won't be as useful (ACE Best Practices guideline); aspirin; suggested cinnamon      Relevant Orders   Ambulatory referral to diabetic education   Basic metabolic panel (Completed)     Other   Urinary hesitancy    Refer back to urologist      Enlarged prostate    Refer back to urologist      Relevant Orders   Ambulatory referral to Urology    Other Visit Diagnoses    Decreased exercise tolerance        may be related to his morbid obesity, deconditioning, but could reflect underlying coronary arter disease; refer to cardiologist for consideration of stresstest    Relevant Orders    Ambulatory referral to Cardiology    Abnormal body temperature        will check TSH; may be medication effect, deconditioning, obesity    Relevant Orders    TSH (Completed)       Follow up plan: Return in about 8 weeks (around 07/06/2015) for thirty minute follow-up with fasting labs.  Orders Placed This Encounter  Procedures  . TSH  . Basic metabolic panel  . Ambulatory referral to diabetic education  . Ambulatory referral to Cardiology  . Ambulatory referral to Urology   An after-visit summary was printed and given to the patient at [facility].  Please see the patient instructions which may contain other information and recommendations beyond what is mentioned above in the assessment and plan.

## 2015-05-12 NOTE — Assessment & Plan Note (Signed)
Refer back to urologist

## 2015-05-12 NOTE — Patient Instructions (Addendum)
I have to put in the referral for the diabetic educator Consider cinnamon capsules for glucose regulation We'll get you to the specialists If you have not heard anything from my staff in a week about any orders/referrals/studies from today, please contact us here to follow-up [phone] Return on or after July 06, 2015 Please read about atorvastatin 40 mg daily for lipid lowering Try to limit saturated fats in your diet (bologna, hot dogs, barbeque, cheeseburgers, hamburgers, steak, bacon, sausage, cheese, etc.) and get more fresh fruits, vegetables, and whole grains

## 2015-05-13 ENCOUNTER — Telehealth: Payer: Self-pay | Admitting: Family Medicine

## 2015-05-13 ENCOUNTER — Encounter: Payer: Self-pay | Admitting: Family Medicine

## 2015-05-13 LAB — BASIC METABOLIC PANEL
BUN / CREAT RATIO: 16 (ref 10–22)
BUN: 15 mg/dL (ref 8–27)
CALCIUM: 9.6 mg/dL (ref 8.6–10.2)
CHLORIDE: 100 mmol/L (ref 96–106)
CO2: 24 mmol/L (ref 18–29)
CREATININE: 0.95 mg/dL (ref 0.76–1.27)
GFR calc Af Amer: 97 mL/min/{1.73_m2} (ref >59)
GFR calc non Af Amer: 84 mL/min/{1.73_m2} (ref >59)
GLUCOSE: 113 mg/dL — AB (ref 65–99)
Potassium: 4.7 mmol/L (ref 3.5–5.2)
Sodium: 139 mmol/L (ref 134–144)

## 2015-05-13 LAB — TSH: TSH: 1.67 u[IU]/mL (ref 0.450–4.500)

## 2015-05-13 NOTE — Telephone Encounter (Signed)
Patient notified. Gave him their contact number and advised him to call and get his appointment scheduled.

## 2015-05-13 NOTE — Telephone Encounter (Signed)
Can you please call the patient and tell him that I actually DID put in the referral for him to see the diabetic educator in December? I was hard on myself for not doing that, but apparently I did, and he needs to know that they tried to reach him about this. Have him contact the [facility] ASAP please.  Copied and pasted from note: Dr. Sanda Klein,      This is a duplicate referral. You placed one in Epic back in Dec. I have left a message for him to call our office to schedule an appointment. I have not heard from him at this time. I will call him again but in the meantime, this order needs to be cancelled since it is a duplicate. Thank you.                 [place]    Administrative Clinical Assistant    Direct Dial: [phone]  Fax: [phone]     Website: [email or web address]

## 2015-05-17 ENCOUNTER — Encounter: Payer: Medicare HMO | Attending: Family Medicine | Admitting: *Deleted

## 2015-05-17 ENCOUNTER — Encounter: Payer: Self-pay | Admitting: *Deleted

## 2015-05-17 VITALS — BP 132/74 | Ht 69.0 in | Wt 274.5 lb

## 2015-05-17 DIAGNOSIS — E119 Type 2 diabetes mellitus without complications: Secondary | ICD-10-CM | POA: Diagnosis not present

## 2015-05-17 NOTE — Patient Instructions (Addendum)
Check blood sugars 1 x day before breakfast or 2 hrs after supper every day Exercise:  Walk   for  15 minutes  3  days a week as tolerated Eat 3 meals day,  1-2 snacks a day Space meals 4-6 hours apart Make an eye doctor appointment Bring blood sugar records to the next class Call your doctor for a prescription for:  1. Meter strips (type) One Touch Ultra Blue  checking  1   times per day  2. Lancets (type) One Touch Delica  checking  1   times per day

## 2015-05-17 NOTE — Progress Notes (Signed)
Diabetes Self-Management Education  Visit Type: First/Initial  Appt. Start Time: 1400 Appt. End Time: [id number]  05/17/2015  Mr. Duane Hughes, identified by name and date of birth, is a 65 y.o. male with a diagnosis of Diabetes: Type 2.   ASSESSMENT  Blood pressure 132/74, height 5\' 9"  (1.753 m), weight 274 lb 8 oz (124.512 kg). Body mass index is 40.52 kg/(m^2).      Diabetes Self-Management Education - 05/17/15 1550    Visit Information   Visit Type First/Initial   Initial Visit   Diabetes Type Type 2   Are you currently following a meal plan? Yes   What type of meal plan do you follow? reduced sugar - sweets only 1 day week   Are you taking your medications as prescribed? Yes   Date Diagnosed Dec 2016   Health Coping   How would you rate your overall health? Good   Psychosocial Assessment   Patient Belief/Attitude about Diabetes Other (comment)  "concerned"   Self-care barriers None   Self-management support Doctor's office;Family;Friends   Patient Concerns Nutrition/Meal planning;Glycemic Control;Weight Control;Healthy Lifestyle;Problem Solving;Monitoring   Special Needs None   Preferred Learning Style Auditory   Learning Readiness Change in progress   How often do you need to have someone help you when you read instructions, pamphlets, or other written materials from your doctor or pharmacy? 1 - Never   What is the last grade level you completed in school? Assoc degree   Complications   Last HgB A1C per patient/outside source 7 %  04/07/15   How often do you check your blood sugar? 0 times/day (not testing)  Provided One Touch Ultra Mini meter and instructed on use. BG upon return demonstration was 121 mg/dL at 3:00 pm - 2 1/2 hrs pp.    Have you had a dilated eye exam in the past 12 months? No   Have you had a dental exam in the past 12 months? Yes   Are you checking your feet? No   Dietary Intake   Breakfast cereal and milk or peanut butter toast or eggs and fruit   Snack (morning) fruit   Lunch chicken and salad or burger   Snack (afternoon) crackers and cheese or yogurt or peanut butter   Dinner chicken and grains and vegetables   Beverage(s) water, diet soda   Exercise   Exercise Type ADL's   Patient Education   Previous Diabetes Education No   Disease state  Definition of diabetes, type 1 and 2, and the diagnosis of diabetes;Factors that contribute to the development of diabetes   Nutrition management  Role of diet in the treatment of diabetes and the relationship between the three main macronutrients and blood glucose level   Physical activity and exercise  Role of exercise on diabetes management, blood pressure control and cardiac health.   Medications Reviewed patients medication for diabetes, action, purpose, timing of dose and side effects.   Monitoring Taught/evaluated SMBG meter.;Purpose and frequency of SMBG.;Identified appropriate SMBG and/or A1C goals.   Chronic complications Relationship between chronic complications and blood glucose control;Retinopathy and reason for yearly dilated eye exams   Psychosocial adjustment Identified and addressed patients feelings and concerns about diabetes   Individualized Goals (developed by patient)   Nutrition Follow meal plan discussed   Physical Activity Exercise 3-5 times per week;15 minutes per day   Medications take my medication as prescribed   Monitoring  test my blood glucose as discussed   Reducing Risk Improve blood  sugars Prevent diabetes complications Lose weight Lead a healthier lifestyle Become more fit   Outcomes   Expected Outcomes Demonstrated interest in learning. Expect positive outcomes      Individualized Plan for Diabetes Self-Management Training:   Learning Objective:  Patient will have a greater understanding of diabetes self-management. Patient education plan is to attend individual and/or group sessions per assessed needs and concerns.   Plan:   Patient  Instructions  Check blood sugars 1 x day before breakfast or 2 hrs after supper every day Exercise:  Walk   for  15 minutes  3  days a week as tolerated Eat 3 meals day,  1-2 snacks a day Space meals 4-6 hours apart Make an eye doctor appointment Bring blood sugar records to the next class Call your doctor for a prescription for:  1. Meter strips (type) One Touch Ultra Blue  checking  1   times per day  2. Lancets (type) One Touch Delica  checking  1   times per day  Expected Outcomes:  Demonstrated interest in learning. Expect positive outcomes  Education material provided: General Meal Planning Guidelines Simple Meal Plan Meter - One Touch Ultra Mini  If problems or questions, patient to contact team via:   Johny Drilling, [place], [place], CDE [phone]  Future DSME appointment: Thursday May 20, 2015 for Class 1

## 2015-05-18 ENCOUNTER — Telehealth: Payer: Self-pay | Admitting: Dietician

## 2015-05-18 NOTE — Assessment & Plan Note (Signed)
Reviewed occlusal referral

## 2015-05-18 NOTE — Assessment & Plan Note (Signed)
Goal systolic less than XX123456 mmHg; ACE-I inhibitor, weight loss, DASH guidelines

## 2015-05-18 NOTE — Assessment & Plan Note (Signed)
Refer back to urologist

## 2015-05-18 NOTE — Telephone Encounter (Signed)
Patient called due to trouble with BG testing.  Reviewed testing instruction by phone; he was applying blood to the test strip and inserting blood sample into the meter.  When attempted the correct procedure, he got an "error 5" message; informed him this was likely due to inadequate blood sample. Instructed patient to "milk" his finger for adequate sampling.  He retested with better blood sample and got a result of 123 mg/dl. Provided another sample box of strips, as he has wasted several strips with failed testing attempts.

## 2015-05-19 DIAGNOSIS — R69 Illness, unspecified: Secondary | ICD-10-CM | POA: Diagnosis not present

## 2015-05-20 ENCOUNTER — Encounter: Payer: Medicare HMO | Admitting: Dietician

## 2015-05-20 VITALS — Ht 69.0 in | Wt 270.4 lb

## 2015-05-20 DIAGNOSIS — E119 Type 2 diabetes mellitus without complications: Secondary | ICD-10-CM | POA: Diagnosis not present

## 2015-05-20 NOTE — Progress Notes (Signed)

## 2015-05-31 ENCOUNTER — Encounter: Payer: Self-pay | Admitting: Urology

## 2015-05-31 ENCOUNTER — Ambulatory Visit (INDEPENDENT_AMBULATORY_CARE_PROVIDER_SITE_OTHER): Payer: Medicare HMO | Admitting: Urology

## 2015-05-31 VITALS — BP 145/87 | HR 89 | Ht 69.0 in | Wt 271.7 lb

## 2015-05-31 DIAGNOSIS — N400 Enlarged prostate without lower urinary tract symptoms: Secondary | ICD-10-CM | POA: Diagnosis not present

## 2015-05-31 DIAGNOSIS — E119 Type 2 diabetes mellitus without complications: Secondary | ICD-10-CM

## 2015-05-31 DIAGNOSIS — N401 Enlarged prostate with lower urinary tract symptoms: Secondary | ICD-10-CM

## 2015-05-31 DIAGNOSIS — N138 Other obstructive and reflux uropathy: Secondary | ICD-10-CM

## 2015-05-31 LAB — URINALYSIS, COMPLETE
BILIRUBIN UA: NEGATIVE
Glucose, UA: NEGATIVE
KETONES UA: NEGATIVE
LEUKOCYTES UA: NEGATIVE
Nitrite, UA: NEGATIVE
Protein, UA: NEGATIVE
RBC UA: NEGATIVE
SPEC GRAV UA: 1.025 (ref 1.005–1.030)
UUROB: 0.2 mg/dL (ref 0.2–1.0)
pH, UA: 6 (ref 5.0–7.5)

## 2015-05-31 LAB — BLADDER SCAN AMB NON-IMAGING: Scan Result: 211

## 2015-05-31 LAB — MICROSCOPIC EXAMINATION
Bacteria, UA: NONE SEEN
EPITHELIAL CELLS (NON RENAL): NONE SEEN /HPF (ref 0–10)
RBC, UA: NONE SEEN /hpf (ref 0–?)

## 2015-05-31 MED ORDER — FINASTERIDE 5 MG PO TABS: 5.0000 mg | ORAL_TABLET | Freq: Every day | ORAL | Status: DC

## 2015-05-31 MED ORDER — TAMSULOSIN HCL 0.4 MG PO CAPS: 0.4000 mg | ORAL_CAPSULE | Freq: Every day | ORAL | Status: DC

## 2015-05-31 NOTE — Progress Notes (Signed)
05/31/2015 4:03 PM   Duane Hughes 08/11/1950 [id number]  Referring provider: Arnetha Courser, MD [address] [place], [place] [address]  Chief Complaint  Patient presents with  . Enlarged prostate    referred by Dr. Sanda Klein    HPI: Patient is a 65 year old Caucasian male with worsening BPH symptoms who presents today as a referral by his PCP, Dr. Sanda Klein.    Patient was seen in our [place] location several years ago for a stone, but he hasn't had any stone symptoms in the last few years.  He lost his insurance and has recently acquired Medicare.    He is currently experiencing urinary frequency, urgency, dysuria, nocturia x 2, incontinence, intermittency, hesitancy, straining to urinate and a weak stream.  His biggest complaint is the urgency.  When he drives for a longer period of time, he experiences urge incontinence.  His UA today was unremarkable.  His PVR is 211 mL.  His IPSS score is 18/2.  He has not had hematuria or suprapubic pain.  He has been diagnosed with diabetes recently, as well.        IPSS      05/31/15 1600       International Prostate Symptom Score   How often have you had the sensation of not emptying your bladder? More than half the time     How often have you had to urinate less than every two hours? Not at All     How often have you found you stopped and started again several times when you urinated? Less than half the time     How often have you found it difficult to postpone urination? About half the time     How often have you had a weak urinary stream? Almost always     How often have you had to strain to start urination? Less than half the time     How many times did you typically get up at night to urinate? 2 Times     Total IPSS Score 18     Quality of Life due to urinary symptoms   If you were to spend the rest of your life with your urinary condition just the way it is now how would you feel about that? Mostly Satisfied        Score:  1-7  Mild 8-19 Moderate 20-35 Severe  He is experiencing some mild erectile dysfunction.  His wife is ten years younger.       PMH: Past Medical History  Diagnosis Date  . Arthritis     back, hands, knees  . History of skin cancer     hand  . Sleep apnea   . Diabetes mellitus without complication ([place])   . Depression     Surgical History: Past Surgical History  Procedure Laterality Date  . Medial partial knee replacement Left     2000s?  Marland Kitchen Hernia repair    . Knee arthroscopy      several scopes done    Home Medications:    Medication List       This list is accurate as of: 05/31/15  4:03 PM.  Always use your most recent med list.               aspirin EC 81 MG tablet  Take 1 tablet (81 mg total) by mouth daily.     buPROPion 150 MG 12 hr tablet  Commonly known as:  WELLBUTRIN  SR  Take 1 tablet (150 mg total) by mouth 2 (two) times daily.     finasteride 5 MG tablet  Commonly known as:  PROSCAR  Take 1 tablet (5 mg total) by mouth daily.     lisinopril 5 MG tablet  Commonly known as:  PRINIVIL,ZESTRIL  Take 1 tablet (5 mg total) by mouth daily.     magnesium gluconate 500 MG tablet  Commonly known as:  MAGONATE  Take 500 mg by mouth daily.     metFORMIN 500 MG tablet  Commonly known as:  GLUCOPHAGE  Take 1 tablet (500 mg total) by mouth 2 (two) times daily with a meal.     MULTIVITAMIN ADULT PO  Take 1 tablet by mouth daily.     Potassium 99 MG Tabs  Take 1 tablet by mouth daily.     sildenafil 100 MG tablet  Commonly known as:  VIAGRA  Take 0.5 tablets (50 mg total) by mouth daily as needed for erectile dysfunction.     tamsulosin 0.4 MG Caps capsule  Commonly known as:  FLOMAX  Take 1 capsule (0.4 mg total) by mouth daily.     traMADol 50 MG tablet  Commonly known as:  ULTRAM  Take 1 tablet (50 mg total) by mouth every 6 (six) hours as needed.     venlafaxine XR 150 MG 24 hr capsule  Commonly known as:  EFFEXOR-XR  TAKE 1 CAPSULE BY  MOUTH DAILY        Allergies: No Known Allergies  Family History: Family History  Problem Relation Age of Onset  . Alcohol abuse Mother   . Rheum arthritis Mother   . Stroke Maternal Grandmother   . Stroke Paternal Grandfather   . Cancer Neg Hx   . Diabetes Neg Hx   . Heart disease Neg Hx   . Hypertension Neg Hx   . Kidney disease Neg Hx   . Prostate cancer Neg Hx     Social History:  reports that he has never smoked. He has never used smokeless tobacco. He reports that he does not drink alcohol or use illicit drugs.  ROS: UROLOGY Frequent Urination?: Yes Hard to postpone urination?: Yes Burning/pain with urination?: Yes Get up at night to urinate?: Yes Leakage of urine?: Yes Urine stream starts and stops?: Yes Trouble starting stream?: Yes Do you have to strain to urinate?: Yes Blood in urine?: No Urinary tract infection?: No Sexually transmitted disease?: No Injury to kidneys or bladder?: No Painful intercourse?: No Weak stream?: Yes Erection problems?: Yes Penile pain?: Yes  Gastrointestinal Nausea?: No Vomiting?: No Indigestion/heartburn?: No Diarrhea?: No Constipation?: No  Constitutional Fever: No Night sweats?: No Weight loss?: Yes Fatigue?: Yes  Skin Skin rash/lesions?: No Itching?: No  Eyes Blurred vision?: No Double vision?: No  Ears/Nose/Throat Sore throat?: No Sinus problems?: Yes  Hematologic/Lymphatic Swollen glands?: No Easy bruising?: No  Cardiovascular Leg swelling?: No Chest pain?: No  Respiratory Cough?: No Shortness of breath?: Yes  Endocrine Excessive thirst?: Yes  Musculoskeletal Back pain?: Yes Joint pain?: Yes  Neurological Headaches?: No Dizziness?: No  Psychologic Depression?: Yes Anxiety?: No  Physical Exam: BP 145/87 mmHg  Pulse 89  Ht 5\' 9"  (1.753 m)  Wt 271 lb 11.2 oz (123.242 kg)  BMI 40.10 kg/m2  Constitutional: Well nourished. Alert and oriented, No acute distress. HEENT: [place] AT,  moist mucus membranes. Trachea midline, no masses. Cardiovascular: No clubbing, cyanosis, or edema. Respiratory: Normal respiratory effort, no increased work of breathing. GI:  Abdomen is soft, non tender, non distended, no abdominal masses. Liver and spleen not palpable.  No hernias appreciated.  Stool sample for occult testing is not indicated.   GU: No CVA tenderness.  No bladder fullness or masses.  Patient with circumcised phallus.   Urethral meatus is patent.  No penile discharge. 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   Scrotum without lesions, cysts, rashes and/or edema.  Testicles are located scrotally bilaterally. No masses are appreciated in the testicles. Left and right epididymis are normal. Rectal: Patient with  normal sphincter tone. Anus and perineum without scarring or rashes. No rectal masses are appreciated. Prostate is approximately 60 (could not palpated entire gland due to buttocks tissue) grams, no nodules are appreciated in the areas I could palpate. Seminal vesicles could not be palpated.   Skin: No rashes, bruises or suspicious lesions. Lymph: No cervical or inguinal adenopathy. Neurologic: Grossly intact, no focal deficits, moving all 4 extremities. Psychiatric: Normal mood and affect.  Laboratory Data: Lab Results  Component Value Date   WBC 7.2 11/11/2014   HCT 43.5 11/11/2014   MCV 90 11/11/2014   PLT 293 11/11/2014    Lab Results  Component Value Date   CREATININE 0.95 05/12/2015    PSA History  0.8 ng/mL on 11/12/2014  0.8 ng/mL on 03/19/2015    Lab Results  Component Value Date   HGBA1C 7.3* 03/19/2015    Lab Results  Component Value Date   TSH 1.670 05/12/2015    Lab Results  Component Value Date   AST 39 03/19/2015   Lab Results  Component Value Date   ALT 42 03/19/2015     Urinalysis Results for orders placed or performed in visit on 05/31/15  Microscopic Examination  Result Value Ref Range   WBC, UA 0-5 0 -  5  /hpf   RBC, UA None seen 0 -  2 /hpf   Epithelial Cells (non renal) None seen 0 - 10 /hpf   Bacteria, UA None seen None seen/Few  Urinalysis, Complete  Result Value Ref Range   Specific Gravity, UA 1.025 1.005 - 1.030   pH, UA 6.0 5.0 - 7.5   Color, UA Yellow Yellow   Appearance Ur Clear Clear   Leukocytes, UA Negative Negative   Protein, UA Negative Negative/Trace   Glucose, UA Negative Negative   Ketones, UA Negative Negative   RBC, UA Negative Negative   Bilirubin, UA Negative Negative   Urobilinogen, Ur 0.2 0.2 - 1.0 mg/dL   Nitrite, UA Negative Negative   Microscopic Examination See below:     Pertinent Imaging: Results for WANDA, BARRONS (MRN [id number]) as of 05/31/2015 15:51  Ref. Range 05/31/2015 15:45  Scan Result Unknown 211    Assessment & Plan:    1. BPH with LUTS:   IPSS score is 18/2.  His PVR is 211.  His prostate did demonstrate benign enlargement on exam.   I discussed the treatment options for BPH, such as: observation over time with prn avoidance of alcohol/caffeine; medical treatment or TURP.  Patient like to pursue medical treatment.  I will start tamsulosin 0.4 mg and finasteride 5 mg.  He is advised to take tamsulosin 30 minutes after a meal.  I advised him of the side effects, such as: retrograde ejaculation, sinus congestion, nasal congestion, rhinorrhea, rhinitis, dizziness, and seasonal allergic rhinitis.  The side effects of finasteride are also discussed with the patient, such as: impotence, loss of interest in sex,  or trouble having an orgasm; abnormal ejaculation; swelling in his hands and/or feet; swelling and/or tenderness in his breasts.  He will follow up in 3 months for a PVR and an IPSS.    - BLADDER SCAN AMB NON-IMAGING - Urinalysis, Complete  2. Diabetes mellitus:   I also explained to the patient that with having the diagnosis of diabetes, this can also have an effect on how the bladder functions.  Since he was without insurance for some time,  it is difficult to know how long he was a diabetic before he was diagnosed.  It may be that his bladder is not functioning due to nerve damage secondary to the diabetes.  If the medication is ineffective, we may need to pursue UDS.    Return in about 3 months (around 08/29/2015) for IPSS score and PVR.  These notes generated with voice recognition software. I apologize for typographical errors.  Zara Council, [facility] [address] [place], [place] [address] [phone]

## 2015-06-02 ENCOUNTER — Telehealth: Payer: Self-pay | Admitting: Family Medicine

## 2015-06-02 DIAGNOSIS — G4733 Obstructive sleep apnea (adult) (pediatric): Secondary | ICD-10-CM | POA: Diagnosis not present

## 2015-06-02 DIAGNOSIS — R69 Illness, unspecified: Secondary | ICD-10-CM | POA: Diagnosis not present

## 2015-06-02 HISTORY — PX: SKIN CANCER EXCISION: SHX779

## 2015-06-02 NOTE — Telephone Encounter (Signed)
Patient notified

## 2015-06-02 NOTE — Telephone Encounter (Signed)
Pt would like to have a call back regarding a ent appt he has. He is unsure why he's going.

## 2015-06-03 ENCOUNTER — Ambulatory Visit: Payer: Commercial Managed Care - HMO

## 2015-06-14 ENCOUNTER — Telehealth: Payer: Self-pay | Admitting: Family Medicine

## 2015-06-14 NOTE — Telephone Encounter (Signed)
Pt would like a call back about his medication.

## 2015-06-15 ENCOUNTER — Telehealth: Payer: Self-pay | Admitting: Urology

## 2015-06-15 NOTE — Telephone Encounter (Signed)
I spoke with the patient. He will discontinue the finasteride for now and stay on the tamsulosin.  We will revisit this when he returns on the 3rd of May.

## 2015-06-15 NOTE — Telephone Encounter (Signed)
Please see the message regarding Duane Hughes.

## 2015-06-15 NOTE — Telephone Encounter (Signed)
I called patient Apologized that no one got back to him; CMA was out yesterday and today and this message was not forwarded to me He donates plasma and asked about the finasteride I reviewed the chart; that was actually started by Zara Council, so I asked if he could please call her office I don't want to change that or tell him to stop since that was started by her; he agrees Phone number given

## 2015-06-15 NOTE — Telephone Encounter (Signed)
Patient called and would like you to call him regarding his being on finasteride. He said that he donates plasma and may need to stop taking it because of being on it but wants to discuss options with you.   michelle

## 2015-06-17 ENCOUNTER — Encounter: Payer: Medicare HMO | Attending: Family Medicine | Admitting: *Deleted

## 2015-06-17 ENCOUNTER — Encounter: Payer: Self-pay | Admitting: *Deleted

## 2015-06-17 VITALS — Wt 266.0 lb

## 2015-06-17 DIAGNOSIS — E119 Type 2 diabetes mellitus without complications: Secondary | ICD-10-CM | POA: Insufficient documentation

## 2015-06-17 DIAGNOSIS — R69 Illness, unspecified: Secondary | ICD-10-CM | POA: Diagnosis not present

## 2015-06-17 NOTE — Progress Notes (Signed)

## 2015-06-21 DIAGNOSIS — H40033 Anatomical narrow angle, bilateral: Secondary | ICD-10-CM | POA: Diagnosis not present

## 2015-06-21 DIAGNOSIS — E119 Type 2 diabetes mellitus without complications: Secondary | ICD-10-CM | POA: Diagnosis not present

## 2015-06-21 LAB — HM DIABETES EYE EXAM

## 2015-06-22 ENCOUNTER — Telehealth: Payer: Self-pay | Admitting: Family Medicine

## 2015-06-22 DIAGNOSIS — Z5181 Encounter for therapeutic drug level monitoring: Secondary | ICD-10-CM

## 2015-06-22 DIAGNOSIS — E785 Hyperlipidemia, unspecified: Secondary | ICD-10-CM | POA: Insufficient documentation

## 2015-06-22 MED ORDER — ATORVASTATIN CALCIUM 10 MG PO TABS: 10.0000 mg | ORAL_TABLET | Freq: Every day | ORAL | Status: DC

## 2015-06-22 NOTE — Telephone Encounter (Signed)
Pt returned call from Dr. Sanda Klein please call pt back ASAP. Thanks.

## 2015-06-22 NOTE — Assessment & Plan Note (Signed)
Check sgpt mid to late April 2017, new statin start

## 2015-06-22 NOTE — Assessment & Plan Note (Addendum)
Start statin therapy Feb 2017; check lipids and sgpt in mid to late April 2017

## 2015-06-22 NOTE — Telephone Encounter (Signed)
I spoke with patient; discussed new lower guidelines for statin; will start low dose atorvastatin (his last two LDLs have been 101 and 111); discussed possible side effects, reasons to stop med and call me; recheck lipids and SGPT in 2 months

## 2015-06-22 NOTE — Telephone Encounter (Signed)
Note from [facility] about statin therapy; left msg explaining to patient I received note, want to talk to him about cholesterol medicine

## 2015-06-23 DIAGNOSIS — D485 Neoplasm of uncertain behavior of skin: Secondary | ICD-10-CM | POA: Diagnosis not present

## 2015-06-23 DIAGNOSIS — L821 Other seborrheic keratosis: Secondary | ICD-10-CM | POA: Diagnosis not present

## 2015-06-23 DIAGNOSIS — Z85828 Personal history of other malignant neoplasm of skin: Secondary | ICD-10-CM | POA: Diagnosis not present

## 2015-06-23 DIAGNOSIS — C4492 Squamous cell carcinoma of skin, unspecified: Secondary | ICD-10-CM

## 2015-06-23 DIAGNOSIS — C44622 Squamous cell carcinoma of skin of right upper limb, including shoulder: Secondary | ICD-10-CM | POA: Diagnosis not present

## 2015-06-23 DIAGNOSIS — L578 Other skin changes due to chronic exposure to nonionizing radiation: Secondary | ICD-10-CM | POA: Diagnosis not present

## 2015-06-23 HISTORY — DX: Squamous cell carcinoma of skin, unspecified: C44.92

## 2015-06-24 ENCOUNTER — Encounter: Payer: Medicare HMO | Admitting: Dietician

## 2015-06-24 ENCOUNTER — Encounter: Payer: Self-pay | Admitting: Dietician

## 2015-06-24 VITALS — BP 122/68 | Ht 69.0 in | Wt 265.0 lb

## 2015-06-24 DIAGNOSIS — E119 Type 2 diabetes mellitus without complications: Secondary | ICD-10-CM

## 2015-06-24 NOTE — Progress Notes (Signed)

## 2015-06-25 ENCOUNTER — Other Ambulatory Visit: Payer: Self-pay | Admitting: Family Medicine

## 2015-06-25 MED ORDER — BUPROPION HCL ER (SR) 150 MG PO TB12: 150.0000 mg | ORAL_TABLET | Freq: Two times a day (BID) | ORAL | Status: DC

## 2015-06-25 NOTE — Telephone Encounter (Signed)
rx approved

## 2015-06-28 ENCOUNTER — Encounter: Payer: Self-pay | Admitting: *Deleted

## 2015-06-28 ENCOUNTER — Telehealth: Payer: Self-pay | Admitting: Family Medicine

## 2015-06-28 NOTE — Telephone Encounter (Signed)
Updated medlist faxed.

## 2015-06-28 NOTE — Telephone Encounter (Signed)
Pt called stated he needs a signed copy of his current medication list faxed to [facility]. Fax # [phone]. Pt wants to make sure Finasteride is not on the list as pt is no longer taking. Pt stated the updated list needs to be signed by Amy or Dr. Sanda Klein. Thanks.

## 2015-06-29 ENCOUNTER — Encounter: Payer: Self-pay | Admitting: Cardiovascular Disease

## 2015-06-29 ENCOUNTER — Ambulatory Visit (INDEPENDENT_AMBULATORY_CARE_PROVIDER_SITE_OTHER): Payer: Medicare HMO | Admitting: Cardiovascular Disease

## 2015-06-29 VITALS — HR 103 | Ht 64.0 in | Wt 263.8 lb

## 2015-06-29 DIAGNOSIS — R0602 Shortness of breath: Secondary | ICD-10-CM

## 2015-06-29 DIAGNOSIS — I10 Essential (primary) hypertension: Secondary | ICD-10-CM | POA: Diagnosis not present

## 2015-06-29 NOTE — Progress Notes (Signed)
Cardiology Office Note   Date:  06/29/2015   ID:  Duane Hughes, DOB 24-May-1950, MRN [id number]  PCP:  Enid Derry, MD  Cardiologist:  New   Chief Complaint  Patient presents with  . other    Ref by Dr. Sanda Klein for shortness of breath with little exertion. Meds reviewed by the patient verbally.       History of Present Illness: Duane Hughes is a 65 y.o. male who was referred for evaluation of exertional dyspnea. He has no previous cardiac history. He has known history of type 2 diabetes recently diagnosed, obesity, hypertension and hyperlipidemia. He has family history of stroke but not coronary artery disease. He is not a smoker. Over the last year, he has experienced progressive exertional dyspnea which has been gradually worsening. This is currently happening with regular activities especially when he is trying to play golf. It's very noticeable when he goes up one flight of stairs. He denies any chest pain. The shortness of breath is worse with urination. No orthopnea, PND or leg edema. No change in his weight recently. He reports no recent cardiac workup. He does not exercise on a regular basis and reports significant limitations related to significant arthritis in both knees.    Past Medical History  Diagnosis Date  . Arthritis     back, hands, knees  . History of skin cancer     hand  . Sleep apnea   . Diabetes mellitus without complication ([place])   . Depression     Past Surgical History  Procedure Laterality Date  . Medial partial knee replacement Left     2000s?  Marland Kitchen Hernia repair    . Knee arthroscopy      several scopes done     Current Outpatient Prescriptions  Medication Sig Dispense Refill  . aspirin EC 81 MG tablet Take 1 tablet (81 mg total) by mouth daily. 30 tablet 11  . atorvastatin (LIPITOR) 10 MG tablet Take 1 tablet (10 mg total) by mouth at bedtime. 30 tablet 1  . buPROPion (WELLBUTRIN SR) 150 MG 12 hr tablet Take 1 tablet (150 mg total) by  mouth 2 (two) times daily. 60 tablet 11  . lisinopril (PRINIVIL,ZESTRIL) 5 MG tablet Take 1 tablet (5 mg total) by mouth daily. 90 tablet 3  . magnesium gluconate (MAGONATE) 500 MG tablet Take 500 mg by mouth daily.    . metFORMIN (GLUCOPHAGE) 500 MG tablet Take 1 tablet (500 mg total) by mouth 2 (two) times daily with a meal. 60 tablet 3  . Multiple Vitamins-Minerals (MULTIVITAMIN ADULT PO) Take 1 tablet by mouth daily.    . Potassium 99 MG TABS Take 1 tablet by mouth daily.    . sildenafil (VIAGRA) 100 MG tablet Take 0.5 tablets (50 mg total) by mouth daily as needed for erectile dysfunction. 5 tablet 11  . tamsulosin (FLOMAX) 0.4 MG CAPS capsule Take 1 capsule (0.4 mg total) by mouth daily. 90 capsule 3  . traMADol (ULTRAM) 50 MG tablet Take 1 tablet (50 mg total) by mouth every 6 (six) hours as needed. 40 tablet 0  . venlafaxine XR (EFFEXOR-XR) 150 MG 24 hr capsule TAKE 1 CAPSULE BY MOUTH DAILY 30 capsule 6   No current facility-administered medications for this visit.    Allergies:   Review of patient's allergies indicates no known allergies.    Social History:  The patient  reports that he has never smoked. He has never used smokeless tobacco. He  reports that he does not drink alcohol or use illicit drugs.   Family History:  The patient's family history includes Alcohol abuse in his mother; Rheum arthritis in his mother; Stroke in his maternal grandmother and paternal grandfather. There is no history of Cancer, Diabetes, Heart disease, Hypertension, Kidney disease, or Prostate cancer.    ROS:  Please see the history of present illness.   Otherwise, review of systems are positive for none.   All other systems are reviewed and negative.    PHYSICAL EXAM: VS:  Pulse 103  Ht 5\' 4"  (1.626 m)  Wt 263 lb 12 oz (119.636 kg)  BMI 45.25 kg/m2 , BMI Body mass index is 45.25 kg/(m^2). GEN: Well nourished, well developed, in no acute distress HEENT: normal Neck: no JVD, carotid bruits, or  masses Cardiac: RRR; no murmurs, rubs, or gallops,no edema  Respiratory:  clear to auscultation bilaterally, normal work of breathing GI: soft, nontender, nondistended, + BS MS: no deformity or atrophy Skin: warm and dry, no rash Neuro:  Strength and sensation are intact Psych: euthymic mood, full affect   EKG:  EKG is ordered today. The ekg ordered today demonstrates sinus tachycardia with no significant ST or T wave changes.   Recent Labs: 11/11/2014: Platelets 293 03/19/2015: ALT 42 05/12/2015: BUN 15; Creatinine, Ser 0.95; Potassium 4.7; Sodium 139; TSH 1.670    Lipid Panel    Component Value Date/Time   CHOL 187 03/19/2015 0825   TRIG 175* 03/19/2015 0825   HDL 41 03/19/2015 0825   LDLCALC 111* 03/19/2015 0825      Wt Readings from Last 3 Encounters:  06/29/15 263 lb 12 oz (119.636 kg)  06/24/15 265 lb (120.203 kg)  06/17/15 266 lb (120.657 kg)         ASSESSMENT AND PLAN:  1.  Exertional dyspnea : The patient reports significant exertional dyspnea which is likely multifactorial due to physical deconditioning and obesity.  Underlying ischemic heart disease cannot be completely excluded given his multiple risk factors for coronary artery disease. Thus, I recommend evaluation with a pharmacologic nuclear stress test. He is not able to exercise on a treadmill due to significant knee arthritis. I discussed with the patient the importance of lifestyle changes in order to decrease the chance of future coronary artery disease and cardiovascular events. We discussed the importance of controlling risk factors, healthy diet as well as regular exercise.  2. Hyperlipidemia: Continue treatment with atorvastatin with a target LDL of less than 100 and preferably less than 70.      Disposition:   FU with me in as needed if stress test is abnormal.   Signed, Kathlyn Sacramento, MD  06/29/2015 2:32 PM    [facility]

## 2015-06-29 NOTE — Patient Instructions (Addendum)
Medication Instructions:  Your physician recommends that you continue on your current medications as directed. Please refer to the Current Medication list given to you today.   Labwork: none  Testing/Procedures: Your physician has requested that you have a lexiscan myoview. For further information please visit [email or web address]. Please follow instruction sheet, as given.  [place]  Your caregiver has ordered a Stress Test with nuclear imaging. The purpose of this test is to evaluate the blood supply to your heart muscle. This procedure is referred to as a "Non-Invasive Stress Test." This is because other than having an IV started in your vein, nothing is inserted or "invades" your body. Cardiac stress tests are done to find areas of poor blood flow to the heart by determining the extent of coronary artery disease (CAD). Some patients exercise on a treadmill, which naturally increases the blood flow to your heart, while others who are  unable to walk on a treadmill due to physical limitations have a pharmacologic/chemical stress agent called Lexiscan . This medicine will mimic walking on a treadmill by temporarily increasing your coronary blood flow.   Please note: these test may take anywhere between 2-4 hours to complete  PLEASE REPORT TO [place] AT THE FIRST DESK WILL DIRECT YOU WHERE TO GO  Date of Procedure:__March 3_______  Arrival Time for Procedure:____8:15am_______  Instructions regarding medication:   __xx__ : Hold diabetes medication morning of procedure. Do not take metformin the morning of your test.  PLEASE NOTIFY THE OFFICE AT LEAST 24 HOURS IN ADVANCE IF YOU ARE UNABLE TO KEEP YOUR APPOINTMENT.  [phone] AND  PLEASE NOTIFY NUCLEAR MEDICINE AT [facility] AT LEAST 24 HOURS IN ADVANCE IF YOU ARE UNABLE TO KEEP YOUR APPOINTMENT. [phone]  How to prepare for your Myoview test:   Do not eat or drink after midnight  No caffeine  for 24 hours prior to test  No smoking 24 hours prior to test.  Your medication may be taken with water.  If your doctor stopped a medication because of this test, do not take that medication.  Ladies, please do not wear dresses.  Skirts or pants are appropriate. Please wear a short sleeve shirt.  No perfume, cologne or lotion.  Wear comfortable walking shoes. No heels!            Follow-Up: Your physician recommends that you schedule a follow-up appointment as needed with Dr. Fletcher Anon.    Any Other Special Instructions Will Be Listed Below (If Applicable).     If you need a refill on your cardiac medications before your next appointment, please call your pharmacy.  Cardiac Nuclear Scanning A cardiac nuclear scan is used to check your heart for problems, such as the following:  A portion of the heart is not getting enough blood.  Part of the heart muscle has died, which happens with a heart attack.  The heart wall is not working normally.  In this test, a radioactive dye (tracer) is injected into your bloodstream. After the tracer has traveled to your heart, a scanning device is used to measure how much of the tracer is absorbed by or distributed to various areas of your heart. LET [facility] CARE PROVIDER KNOW ABOUT:  Any allergies you have.  All medicines you are taking, including vitamins, herbs, eye drops, creams, and over-the-counter medicines.  Previous problems you or members of your family have had with the use of anesthetics.  Any blood disorders you have.  Previous  surgeries you have had.  Medical conditions you have.  RISKS AND COMPLICATIONS Generally, this is a safe procedure. However, as with any procedure, problems can occur. Possible problems include:   Serious chest pain.  Rapid heartbeat.  Sensation of warmth in your chest. This usually passes quickly. BEFORE THE PROCEDURE Ask your health care provider about changing or stopping your  regular medicines. PROCEDURE This procedure is usually done at a hospital and takes 2-4 hours.  An IV tube is inserted into one of your veins.  Your health care provider will inject a small amount of radioactive tracer through the tube.  You will then wait for 20-40 minutes while the tracer travels through your bloodstream.  You will lie down on an exam table so images of your heart can be taken. Images will be taken for about 15-20 minutes.  You will exercise on a treadmill or stationary bike. While you exercise, your heart activity will be monitored with an electrocardiogram (ECG), and your blood pressure will be checked.  If you are unable to exercise, you may be given a medicine to make your heart beat faster.  When blood flow to your heart has peaked, tracer will again be injected through the IV tube.  After 20-40 minutes, you will get back on the exam table and have more images taken of your heart.  When the procedure is over, your IV tube will be removed. AFTER THE PROCEDURE  You will likely be able to leave shortly after the test. Unless your health care provider tells you otherwise, you may return to your normal schedule, including diet, activities, and medicines.  Make sure you find out how and when you will get your test results.   This information is not intended to replace advice given to you by your health care provider. Make sure you discuss any questions you have with your health care provider.   Document Released: 05/12/2004 Document Revised: 04/22/2013 Document Reviewed: 03/26/2013 Elsevier Interactive Patient Education [employer].

## 2015-06-30 ENCOUNTER — Other Ambulatory Visit: Payer: Self-pay | Admitting: Family Medicine

## 2015-07-01 ENCOUNTER — Telehealth: Payer: Self-pay | Admitting: Cardiovascular Disease

## 2015-07-01 NOTE — Telephone Encounter (Signed)
60 pills with 11 refills were just prescribed on Feb 24th; please resolve with pharmacy [facility])

## 2015-07-01 NOTE — Telephone Encounter (Signed)
Left detailed message on pt cell VM.  Left CB number if questions.

## 2015-07-01 NOTE — Telephone Encounter (Signed)
Spoke with pharmacy, they did not get the rx from 2/24. I called in rx.

## 2015-07-02 ENCOUNTER — Ambulatory Visit: Payer: Commercial Managed Care - HMO | Admitting: Cardiovascular Disease

## 2015-07-02 ENCOUNTER — Encounter
Admission: RE | Admit: 2015-07-02 | Discharge: 2015-07-02 | Disposition: A | Discharge status: Home / Self Care | Payer: Medicare HMO | Source: Ambulatory Visit | Attending: Cardiovascular Disease | Admitting: Cardiovascular Disease

## 2015-07-02 DIAGNOSIS — R0602 Shortness of breath: Secondary | ICD-10-CM | POA: Diagnosis not present

## 2015-07-02 LAB — NM MYOCAR MULTI W/SPECT W/WALL MOTION / EF
CHL CUP NUCLEAR SRS: 18
CHL CUP RESTING HR STRESS: 76 {beats}/min
CHL CUP STRESS STAGE 1 GRADE: 0 %
CHL CUP STRESS STAGE 1 HR: 73 {beats}/min
CHL CUP STRESS STAGE 1 SPEED: 0 mph
CHL CUP STRESS STAGE 2 HR: 73 {beats}/min
CHL CUP STRESS STAGE 4 GRADE: 0 %
CHL CUP STRESS STAGE 4 HR: 97 {beats}/min
CHL CUP STRESS STAGE 4 SPEED: 0 mph
CHL CUP STRESS STAGE 5 DBP: 70 mmHg
CHL CUP STRESS STAGE 5 GRADE: 0 %
CHL CUP STRESS STAGE 5 HR: 87 {beats}/min
CSEPED: 0 min
CSEPEDS: 0 s
CSEPEW: 1 METS
CSEPPMHR: 60 %
LVDIAVOL: 69 mL
LVSYSVOL: 20 mL
MPHR: 155 {beats}/min
NUC STRESS TID: 1.05
Peak HR: 93 {beats}/min
Percent HR: 63 %
SDS: 0
SSS: 10
Stage 2 Grade: 0 %
Stage 2 Speed: 0 mph
Stage 3 Grade: 0 %
Stage 3 HR: 93 {beats}/min
Stage 3 Speed: 0 mph
Stage 4 DBP: 63 mmHg
Stage 4 SBP: 95 mmHg
Stage 5 SBP: 120 mmHg
Stage 5 Speed: 0 mph

## 2015-07-02 MED ORDER — TECHNETIUM TC 99M SESTAMIBI GENERIC - CARDIOLITE
13.9500 | Freq: Once | INTRAVENOUS | Status: AC | PRN
  Administered 2015-07-02: 13.95 via INTRAVENOUS

## 2015-07-02 MED ORDER — TECHNETIUM TC 99M SESTAMIBI - CARDIOLITE: 13.0000 | Freq: Once | INTRAVENOUS | Status: DC | PRN

## 2015-07-02 MED ORDER — TECHNETIUM TC 99M SESTAMIBI GENERIC - CARDIOLITE
32.0400 | Freq: Once | INTRAVENOUS | Status: AC | PRN
  Administered 2015-07-02: 32.04 via INTRAVENOUS

## 2015-07-02 MED ORDER — REGADENOSON 0.4 MG/5ML IV SOLN
0.4000 mg | Freq: Once | INTRAVENOUS | Status: AC
  Administered 2015-07-02: 0.4 mg via INTRAVENOUS

## 2015-07-06 ENCOUNTER — Ambulatory Visit (INDEPENDENT_AMBULATORY_CARE_PROVIDER_SITE_OTHER): Payer: Medicare HMO | Admitting: Family Medicine

## 2015-07-06 ENCOUNTER — Ambulatory Visit: Payer: Medicare HMO | Attending: Otolaryngology

## 2015-07-06 ENCOUNTER — Encounter: Payer: Self-pay | Admitting: Family Medicine

## 2015-07-06 VITALS — BP 139/84 | HR 93 | Ht 67.75 in | Wt 262.0 lb

## 2015-07-06 DIAGNOSIS — E785 Hyperlipidemia, unspecified: Secondary | ICD-10-CM

## 2015-07-06 DIAGNOSIS — F5101 Primary insomnia: Secondary | ICD-10-CM | POA: Insufficient documentation

## 2015-07-06 DIAGNOSIS — G4733 Obstructive sleep apnea (adult) (pediatric): Secondary | ICD-10-CM | POA: Insufficient documentation

## 2015-07-06 DIAGNOSIS — R69 Illness, unspecified: Secondary | ICD-10-CM | POA: Diagnosis not present

## 2015-07-06 DIAGNOSIS — I10 Essential (primary) hypertension: Secondary | ICD-10-CM

## 2015-07-06 DIAGNOSIS — G4730 Sleep apnea, unspecified: Secondary | ICD-10-CM | POA: Diagnosis not present

## 2015-07-06 DIAGNOSIS — E119 Type 2 diabetes mellitus without complications: Secondary | ICD-10-CM | POA: Diagnosis not present

## 2015-07-06 DIAGNOSIS — G4761 Periodic limb movement disorder: Secondary | ICD-10-CM | POA: Diagnosis not present

## 2015-07-06 NOTE — Progress Notes (Signed)
BP 139/84 mmHg  Pulse 93  Ht 5' 7.75" (1.721 m)  Wt 262 lb (118.842 kg)  BMI 40.12 kg/m2  SpO2 97%   Subjective:    Patient ID: Duane Hughes, male    DOB: Sep 04, 1950, 65 y.o.   MRN: [id number]  HPI: Duane Hughes is a 65 y.o. male  Chief Complaint  Patient presents with  . Diabetes    8 week follow up and labs  . Hypertension    8 week follow up and labs  . Hyperlipidemia    he started Atorvastatin, will be due for labs around April 21st   Type 2 diabetes; no problems with his feet; he is getting a good handle on things; tests 2-3 times a day at different times; he has only one or two numbers over 200; one was high and then did recheck and it was 140; he feels a little hypoglycemic sx in the 80s; he seldom gets sugars under 100; usually 130 and 140; occasionally in the 110s; pastas don't run his sugars up like corn chips do; he has seen the eye doctor; no problems with his eyes; no issues there; no retinal problems  Hypertension; [id number] systolic today; white coat HTN; 118/62 at home; no problems with dizziness or light-headedness  High cholesterol; tolerating statin just fine  Obesity; he wants to get below 250; goal is about 235 pounds for his age and body build  Relevant past medical, surgical, family and social history reviewed and updated as indicated Past Medical History  Diagnosis Date  . Arthritis     back, hands, knees  . History of skin cancer     hand  . Sleep apnea   . Diabetes mellitus without complication ([place])   . Depression    Past Surgical History  Procedure Laterality Date  . Medial partial knee replacement Left     2000s?  Marland Kitchen Hernia repair    . Knee arthroscopy      several scopes done  . Skin cancer excision  Feb 2017    removed from right hand   Family History  Problem Relation Age of Onset  . Alcohol abuse Mother   . Rheum arthritis Mother   . Stroke Maternal Grandmother   . Stroke Paternal Grandfather   . Cancer Neg Hx   . Diabetes Neg  Hx   . Heart disease Neg Hx   . Hypertension Neg Hx   . Kidney disease Neg Hx   . Prostate cancer Neg Hx    Interim medical history since our last visit reviewed. Allergies and medications reviewed and updated.  Review of Systems Per HPI unless specifically indicated above     Objective:    BP 139/84 mmHg  Pulse 93  Ht 5' 7.75" (1.721 m)  Wt 262 lb (118.842 kg)  BMI 40.12 kg/m2  SpO2 97%  Wt Readings from Last 3 Encounters:  07/06/15 262 lb (118.842 kg)  06/29/15 263 lb 12 oz (119.636 kg)  06/24/15 265 lb (120.203 kg)    Physical Exam  Constitutional: He appears well-developed and well-nourished. No distress.  Weight down 3 pounds in 2 weeks  HENT:  Head: Normocephalic and atraumatic.  Eyes: EOM are normal. No scleral icterus.  Neck: No thyromegaly present.  Cardiovascular: Normal rate and regular rhythm.   Pulmonary/Chest: Effort normal and breath sounds normal.  Abdominal: Soft. Bowel sounds are normal. He exhibits no distension.  Musculoskeletal: He exhibits no edema.  Neurological: He displays no tremor.  Skin:  Skin is warm and dry. No pallor.  Psychiatric: He has a normal mood and affect. Judgment normal. His mood appears not anxious. Cognition and memory are normal. He does not exhibit a depressed mood.   Diabetic Foot Form - Detailed   Diabetic Foot Exam - detailed  Diabetic Foot exam was performed with the following findings:  Yes 07/06/2015 11:46 AM  Visual Foot Exam completed.:  Yes  Are the toenails long?:  No  Are the toenails thick?:  No  Are the toenails ingrown?:  No  Normal Range of Motion:  Yes    Pulse Foot Exam completed.:  Yes  Right Dorsalis Pedis:  Present Left Dorsalis Pedis:  Present  Sensory Foot Exam Completed.:  Yes  Swelling:  No  Semmes-Weinstein Monofilament Test  R Site 1-Great Toe:  Pos L Site 1-Great Toe:  Pos  R Site 4:  Pos L Site 4:  Pos  R Site 5:  Pos L Site 5:  Pos    Comments:  Callus medial left great toe     Results  for orders placed or performed during the hospital encounter of 07/02/15  NM Myocar Multi W/Spect W/Wall Motion / EF  Result Value Ref Range   Rest HR 76 bpm   Rest BP 124/71 mmHg   Exercise duration (min) 0 min   Exercise duration (sec) 0 sec   Estimated workload 1.0 METS   Peak HR 93 BPM   Peak BP  mmHg   MPHR 155 bpm   Percent HR 63 %   RPE     LV sys vol 20 mL   TID 1.05    LV dias vol 69 mL   LHR     SSS 10    SRS 18    SDS 0    Phase 1 name PREINFSN    Stage 1 Name SUPINE    Stage 1 Time 00:00:02    Stage 1 Speed 0.0 mph   Stage 1 Grade 0.0 %   Stage 1 HR 73 bpm   Phase 2 Name Infusion    Stage 2 Name DOSE 1    Stage 2 Attribute Baseline    Stage 2 Time 00:00:00    Stage 2 Speed 0.0 mph   Stage 2 Grade 0.0 %   Stage 2 HR 73 bpm   Phase 3 Name Infusion    Stage 3 Name DOSE 1    Stage 3 Attribute Peak    Stage 3 Time 00:01:11    Stage 3 Speed 0.0 mph   Stage 3 Grade 0.0 %   Stage 3 HR 93 bpm   Phase 4 Name POSTINFSN    Stage 4 Attribute Recovery 67min    Stage 4 Time 00:01:03    Stage 4 Speed 0.0 mph   Stage 4 Grade 0.0 %   Stage 4 HR 97 bpm   Stage 4 SBP 95 mmHg   Stage 4 DBP 63 mmHg   Phase 5 Name POSTINFSN    Stage 5 Time 00:04:04    Stage 5 Speed 0.0 mph   Stage 5 Grade 0.0 %   Stage 5 HR 87 bpm   Stage 5 SBP 120 mmHg   Stage 5 DBP 70 mmHg   Percent of predicted max HR 60 %      Assessment & Plan:   Problem List Items Addressed This Visit      Cardiovascular and Mediastinum   Benign hypertension - Primary    Controlled  today; continue to work on weight loss, healthy eating, staying active, continue meds        Endocrine   Diabetes mellitus without complication ([place])    Will check next A1c when he comes in for lipid panel in April; goal is less than 7 ideally; so glad that he is using opportunity to check FSBS as a learning tool; limiting foods which run up sugars; work on weight loss; check feet every night (current just 4x a week or so);  eye exams yearly; aspirin, statin, ACE-I        Other   Morbid obesity ([place])    He is just 0.12 BMI points away from getting out of morbid obesity category; encouragement given to continue to work on weight loss      Dyslipidemia    Tolerating statin; he'll be due for lipids and SGPT in April; continue to work on weight loss and healthier eating, limiting saturated fats         Follow up plan: Return in about 6 weeks (around 08/17/2015) for fasting labs and visit with Dr. Sanda Klein.  An after-visit summary was printed and given to the patient at [place].  Please see the patient instructions which may contain other information and recommendations beyond what is mentioned above in the assessment and plan.

## 2015-07-06 NOTE — Patient Instructions (Addendum)
Return to see me at [facility] around April 18th or just after for fasting labs If you need me before April 3rd, please call me at [facility] If you need me on April 3rd or after, please call at [place] your feet every night

## 2015-07-06 NOTE — Assessment & Plan Note (Signed)
Controlled today; continue to work on weight loss, healthy eating, staying active, continue meds

## 2015-07-06 NOTE — Assessment & Plan Note (Signed)
Tolerating statin; he'll be due for lipids and SGPT in April; continue to work on weight loss and healthier eating, limiting saturated fats

## 2015-07-06 NOTE — Assessment & Plan Note (Signed)
He is just 0.12 BMI points away from getting out of morbid obesity category; encouragement given to continue to work on weight loss

## 2015-07-06 NOTE — Assessment & Plan Note (Signed)
Will check next A1c when he comes in for lipid panel in April; goal is less than 7 ideally; so glad that he is using opportunity to check FSBS as a learning tool; limiting foods which run up sugars; work on weight loss; check feet every night (current just 4x a week or so); eye exams yearly; aspirin, statin, ACE-I

## 2015-07-12 DIAGNOSIS — R69 Illness, unspecified: Secondary | ICD-10-CM | POA: Diagnosis not present

## 2015-07-13 ENCOUNTER — Telehealth: Payer: Self-pay | Admitting: Family Medicine

## 2015-07-13 NOTE — Telephone Encounter (Signed)
Pt needs letter stating he stopped taking Finasteride on 06/17/15 faxed to [employer] [phone].

## 2015-07-13 NOTE — Telephone Encounter (Signed)
Patient is waiting, just needs something stating he no longer takes it. I printed out the discontinued report for it and faxed it.

## 2015-07-14 ENCOUNTER — Other Ambulatory Visit: Payer: Self-pay | Admitting: Family Medicine

## 2015-07-14 DIAGNOSIS — E119 Type 2 diabetes mellitus without complications: Secondary | ICD-10-CM

## 2015-07-14 NOTE — Assessment & Plan Note (Signed)
Refill metformin; order for A1c for around 4/18 with other labs

## 2015-07-14 NOTE — Telephone Encounter (Signed)
Rx approved; new order for A1c entered for April

## 2015-07-19 ENCOUNTER — Other Ambulatory Visit: Payer: Self-pay

## 2015-07-23 ENCOUNTER — Other Ambulatory Visit: Payer: Self-pay | Admitting: Family Medicine

## 2015-07-23 NOTE — Telephone Encounter (Signed)
Rx sent 

## 2015-07-27 ENCOUNTER — Telehealth: Payer: Self-pay | Admitting: Family Medicine

## 2015-07-27 NOTE — Telephone Encounter (Signed)
Pt called stated he would like a call back from Dr. Sanda Klein. Thanks.

## 2015-07-28 NOTE — Telephone Encounter (Signed)
Routing to provider  

## 2015-07-28 NOTE — Telephone Encounter (Signed)
Okay, please talk with patient and find out what the issue is. There may be something you can help him with (refills, referrals, the need for an appt, etc.); thank you

## 2015-07-28 NOTE — Telephone Encounter (Signed)
I spoke with patient, he said he needs a refill on his generic Viagra and that the overseas company would be sending a refill request to [place].

## 2015-07-30 ENCOUNTER — Telehealth: Payer: Self-pay

## 2015-07-30 NOTE — Telephone Encounter (Addendum)
Dr.Lada, he is following you to [facility]. He has to get his ED med thru mail order. He needs a rx for Levita 20mg . (He has viagra listed in his chart currently) It needs to be faxed to [email or web address] at [phone]. If you'll just print out the rx, I'll fax it to them because they are not listed in the pharmacy list on EP[facility].

## 2015-07-30 NOTE — Telephone Encounter (Signed)
I'm so glad he'll continue to see me I will ask, if he doesn't mind, to please contact [facility] for this prescription, though (urology) Since she's taking care of this issue for him, I'd like to have them manage this prescription for him Thank you

## 2015-08-02 ENCOUNTER — Telehealth: Payer: Self-pay | Admitting: Urology

## 2015-08-02 NOTE — Telephone Encounter (Signed)
Patient notified

## 2015-08-02 NOTE — Telephone Encounter (Signed)
Mr. Duane Hughes called saying his PCP, Dr. Enid Derry would prefer we send the Rx for Civitra to the pharmacy [place] for him. She initially prescribed his Viagra but since he's come to our office, she'd like [place] to take over the Rx. Mr. Duane Hughes said [name] is less expensive. The phone number to the pharmacy is: [phone]. Mr. Duane Hughes would like a phone call back concerning this.  Pt's ph# [phone] Thank you.

## 2015-08-03 NOTE — Telephone Encounter (Signed)
Please advise 

## 2015-08-04 NOTE — Telephone Encounter (Signed)
Patient called again this afternoon regarding this prescription.  He would like to add the phone # [phone] and fax # [phone] for the pharmacy.

## 2015-08-05 ENCOUNTER — Other Ambulatory Visit: Payer: Self-pay | Admitting: Urology

## 2015-08-05 MED ORDER — VARDENAFIL HCL 20 MG PO TABS: ORAL_TABLET | ORAL | Status: DC

## 2015-08-05 NOTE — Telephone Encounter (Signed)
Have called in the prescription for the Levitra and also sent a fax to his pharmacy.

## 2015-08-06 DIAGNOSIS — R69 Illness, unspecified: Secondary | ICD-10-CM | POA: Diagnosis not present

## 2015-08-12 ENCOUNTER — Other Ambulatory Visit: Payer: Self-pay | Admitting: Family Medicine

## 2015-08-19 ENCOUNTER — Ambulatory Visit: Payer: Medicare HMO | Admitting: Family Medicine

## 2015-08-24 ENCOUNTER — Encounter: Payer: Self-pay | Admitting: Family Medicine

## 2015-08-24 ENCOUNTER — Ambulatory Visit: Payer: Medicare HMO | Attending: Otolaryngology

## 2015-08-24 ENCOUNTER — Other Ambulatory Visit: Payer: Self-pay | Admitting: Family Medicine

## 2015-08-24 ENCOUNTER — Ambulatory Visit (INDEPENDENT_AMBULATORY_CARE_PROVIDER_SITE_OTHER): Payer: Medicare HMO | Admitting: Family Medicine

## 2015-08-24 VITALS — BP 98/58 | HR 84 | Temp 98.0°F | Resp 14 | Wt 268.0 lb

## 2015-08-24 DIAGNOSIS — I10 Essential (primary) hypertension: Secondary | ICD-10-CM | POA: Diagnosis not present

## 2015-08-24 DIAGNOSIS — G4733 Obstructive sleep apnea (adult) (pediatric): Secondary | ICD-10-CM | POA: Insufficient documentation

## 2015-08-24 DIAGNOSIS — E119 Type 2 diabetes mellitus without complications: Secondary | ICD-10-CM | POA: Diagnosis not present

## 2015-08-24 DIAGNOSIS — Z1283 Encounter for screening for malignant neoplasm of skin: Secondary | ICD-10-CM

## 2015-08-24 DIAGNOSIS — Z5181 Encounter for therapeutic drug level monitoring: Secondary | ICD-10-CM

## 2015-08-24 DIAGNOSIS — F330 Major depressive disorder, recurrent, mild: Secondary | ICD-10-CM

## 2015-08-24 DIAGNOSIS — R69 Illness, unspecified: Secondary | ICD-10-CM | POA: Diagnosis not present

## 2015-08-24 DIAGNOSIS — F5104 Psychophysiologic insomnia: Secondary | ICD-10-CM | POA: Diagnosis not present

## 2015-08-24 LAB — POCT GLYCOSYLATED HEMOGLOBIN (HGB A1C): HEMOGLOBIN A1C: 6.2

## 2015-08-24 MED ORDER — LISINOPRIL 5 MG PO TABS: 2.5000 mg | ORAL_TABLET | Freq: Every day | ORAL | Status: DC

## 2015-08-24 NOTE — Progress Notes (Signed)
BP 98/58 mmHg  Pulse 84  Temp(Src) 98 F (36.7 C) (Oral)  Resp 14  Wt 268 lb (121.564 kg)  SpO2 95%   Subjective:    Patient ID: Duane Hughes, male    DOB: Oct 24, 1950, 65 y.o.   MRN: [id number]  HPI: Duane Hughes is a 65 y.o. male  Chief Complaint  Patient presents with  . Follow-up   Type 2 diabetes; checking sugars; a1c 6.2 today Most sugars under 180; fasting mornings now 110s He is eating better, but got a little slack the last two weeks Easter candy He is golfing, but not exercising much; familiar with silver sneakers He has sleep apnea; he was tested 30 years ago, still has the same machine; he has his 2nd sleep study tonight, doctor had to write letters to get it covered; Dr. Pryor Ochoa He has gained weight with recent trip to [place] Kidney function great in January He is on an ACE-I for renal protection; he gives plasma twice a week; BP not typical today; he is surprised how low it is; he feels fine; his BP is usually high teens to 130s  Depression screen [facility] 2/9 08/24/2015 05/17/2015 12/17/2014  Decreased Interest 0 0 0  Down, Depressed, Hopeless 0 0 0  PHQ - 2 Score 0 0 0   Relevant past medical, surgical, family and social history reviewed Past Medical History  Diagnosis Date  . Arthritis     back, hands, knees  . History of skin cancer     hand  . Sleep apnea   . Diabetes mellitus without complication ([place])   . Depression   MD note: morbid obesity  Past Surgical History  Procedure Laterality Date  . Medial partial knee replacement Left     2000s?  Marland Kitchen Hernia repair    . Knee arthroscopy      several scopes done  . Skin cancer excision  Feb 2017    removed from right hand   Family History  Problem Relation Age of Onset  . Alcohol abuse Mother   . Rheum arthritis Mother   . Stroke Maternal Grandmother   . Stroke Paternal Grandfather   . Cancer Neg Hx   . Diabetes Neg Hx   . Heart disease Neg Hx   . Hypertension Neg Hx   . Kidney disease Neg  Hx   . Prostate cancer Neg Hx    Social History  Substance Use Topics  . Smoking status: Never Smoker   . Smokeless tobacco: Never Used  . Alcohol Use: No   Interim medical history since our last visit reviewed. Allergies and medications reviewed and updated.  Review of Systems Per HPI unless specifically indicated above     Objective:    BP 98/58 mmHg  Pulse 84  Temp(Src) 98 F (36.7 C) (Oral)  Resp 14  Wt 268 lb (121.564 kg)  SpO2 95%  Wt Readings from Last 3 Encounters:  09/03/15 266 lb (120.657 kg)  09/01/15 267 lb 4.8 oz (121.246 kg)  08/24/15 268 lb (121.564 kg)   body mass index is 41.04 kg/(m^2).  Physical Exam  Constitutional: He appears well-developed and well-nourished. No distress.  Morbidly obese  HENT:  Head: Normocephalic and atraumatic.  Eyes: EOM are normal. No scleral icterus.  Neck: No thyromegaly present.  Cardiovascular: Normal rate and regular rhythm.   Pulmonary/Chest: Effort normal and breath sounds normal.  Abdominal: Soft. Bowel sounds are normal. He exhibits no distension.  Musculoskeletal: He exhibits no edema.  Neurological: He displays no tremor.  Skin: Skin is warm and dry. No pallor.  Psychiatric: He has a normal mood and affect. Judgment normal. His mood appears not anxious. Cognition and memory are normal. He does not exhibit a depressed mood.   Diabetic Foot Form - Detailed   Diabetic Foot Exam - detailed  Diabetic Foot exam was performed with the following findings:  Yes 08/24/2015  2:01 PM  Visual Foot Exam completed.:  Yes  Are the toenails long?:  No  Are the toenails thick?:  No  Are the toenails ingrown?:  No  Normal Range of Motion:  Yes    Pulse Foot Exam completed.:  Yes  Right Dorsalis Pedis:  Present Left Dorsalis Pedis:  Present  Sensory Foot Exam Completed.:  Yes  Semmes-Weinstein Monofilament Test  R Site 1-Great Toe:  Pos L Site 1-Great Toe:  Pos  R Site 4:  Pos L Site 4:  Pos  R Site 5:  Pos L Site 5:  Pos          Results for orders placed or performed in visit on 08/24/15  Lipid Panel w/o Chol/HDL Ratio  Result Value Ref Range   Cholesterol, Total 142 100 - 199 mg/dL   Triglycerides 103 0 - 149 mg/dL   HDL 43 >39 mg/dL   VLDL Cholesterol Cal 21 5 - 40 mg/dL   LDL Calculated 78 0 - 99 mg/dL  ALT  Result Value Ref Range   ALT 35 0 - 44 IU/L  POCT HgB A1C  Result Value Ref Range   Hemoglobin A1C 6.2       Assessment & Plan:   Problem List Items Addressed This Visit      Cardiovascular and Mediastinum   Benign hypertension    Lower BP; decrease ACE-I; monitor and stop ACE-I completely if not at least 110 mmHg      Relevant Medications   lisinopril (PRINIVIL,ZESTRIL) 5 MG tablet     Respiratory   Obstructive sleep apnea    Working with ENT; going to have 2nd sleep study        Endocrine   Diabetes mellitus without complication (H[facility]) - Primary    Check A1c      Relevant Medications   lisinopril (PRINIVIL,ZESTRIL) 5 MG tablet   Other Relevant Orders   POCT HgB A1C (Completed)   Lipid Panel w/o Chol/HDL Ratio (Completed)     Other   Major depressive disorder, recurrent ([facility])    Continue medicine      Medication monitoring encounter    Check sgpt      Relevant Orders   ALT (Completed)   Morbid obesity ([place])    Encouraged weight loss; see AVS      Skin exam, screening for cancer    Followed by dermatologist         Follow up plan: Return 7-10 days, for CMA visit for BP; 6 months with me.  An after-visit summary was printed and given to the patient at [place].  Please see the patient instructions which may contain other information and recommendations beyond what is mentioned above in the assessment and plan.  Meds ordered this encounter  Medications  . lisinopril (PRINIVIL,ZESTRIL) 5 MG tablet    Sig: Take 0.5 tablets (2.5 mg total) by mouth daily.    Dispense:  45 tablet    Refill:  0    New lower amount    Orders Placed This Encounter   Procedures  . Lipid  Panel w/o Chol/HDL Ratio  . ALT  . POCT HgB A1C

## 2015-08-24 NOTE — Assessment & Plan Note (Signed)
Working with ENT; going to have 2nd sleep study

## 2015-08-24 NOTE — Assessment & Plan Note (Signed)
Followed by dermatologist

## 2015-08-24 NOTE — Assessment & Plan Note (Signed)
Check A1c. 

## 2015-08-24 NOTE — Patient Instructions (Addendum)
You will be due for another colonoscopy this July Check out the information at familydoctor.org entitled "What It Takes to Lose Weight" Try to lose between 1-2 pounds per week by taking in fewer calories and burning off more calories You can succeed by limiting portions, limiting foods dense in calories and fat, becoming more active, and drinking 8 glasses of water a day (64 ounces) Don't skip meals, especially breakfast, as skipping meals may alter your metabolism Do not use over-the-counter weight loss pills or gimmicks that claim rapid weight loss A healthy BMI (or body mass index) is between 18.5 and 24.9 You can calculate your ideal BMI at the [place] website [employer] Cut your blood pressure in half and just take 2.5 mg daily and stop it altogether if pressures are less than 110 mmHg on top If you do a wrist cuff, remember prop and flop

## 2015-08-24 NOTE — Assessment & Plan Note (Signed)
Lower BP; decrease ACE-I; monitor and stop ACE-I completely if not at least 110 mmHg

## 2015-08-24 NOTE — Assessment & Plan Note (Signed)
Check sgpt 

## 2015-08-24 NOTE — Assessment & Plan Note (Signed)
Continue medicine 

## 2015-08-30 DIAGNOSIS — L570 Actinic keratosis: Secondary | ICD-10-CM | POA: Diagnosis not present

## 2015-08-30 DIAGNOSIS — L821 Other seborrheic keratosis: Secondary | ICD-10-CM | POA: Diagnosis not present

## 2015-08-30 DIAGNOSIS — L578 Other skin changes due to chronic exposure to nonionizing radiation: Secondary | ICD-10-CM | POA: Diagnosis not present

## 2015-08-30 DIAGNOSIS — Z85828 Personal history of other malignant neoplasm of skin: Secondary | ICD-10-CM | POA: Diagnosis not present

## 2015-08-30 DIAGNOSIS — L812 Freckles: Secondary | ICD-10-CM | POA: Diagnosis not present

## 2015-08-31 DIAGNOSIS — R69 Illness, unspecified: Secondary | ICD-10-CM | POA: Diagnosis not present

## 2015-09-01 ENCOUNTER — Ambulatory Visit (INDEPENDENT_AMBULATORY_CARE_PROVIDER_SITE_OTHER): Payer: Medicare HMO | Admitting: Urology

## 2015-09-01 ENCOUNTER — Encounter: Payer: Self-pay | Admitting: Urology

## 2015-09-01 VITALS — BP 148/78 | HR 101 | Ht 69.0 in | Wt 267.3 lb

## 2015-09-01 DIAGNOSIS — N401 Enlarged prostate with lower urinary tract symptoms: Secondary | ICD-10-CM

## 2015-09-01 DIAGNOSIS — N528 Other male erectile dysfunction: Secondary | ICD-10-CM | POA: Diagnosis not present

## 2015-09-01 DIAGNOSIS — E119 Type 2 diabetes mellitus without complications: Secondary | ICD-10-CM | POA: Diagnosis not present

## 2015-09-01 DIAGNOSIS — N138 Other obstructive and reflux uropathy: Secondary | ICD-10-CM

## 2015-09-01 DIAGNOSIS — N529 Male erectile dysfunction, unspecified: Secondary | ICD-10-CM

## 2015-09-01 LAB — BLADDER SCAN AMB NON-IMAGING: Scan Result: 15

## 2015-09-01 NOTE — Progress Notes (Signed)
3:00 PM   Duane Hughes 23-Apr-1951 [id number]  Referring provider: Arnetha Courser, MD [address] [place], [place] [address]  Chief Complaint  Patient presents with  . Benign Prostatic Hypertrophy    3 month follow up    HPI: Patient is a 65 year old Caucasian male who presents today for 3 month follow-up after being placed on tamsulosin and finasteride for worsening of his BPH with LUTS.  Back ground history Patient with worsening BPH symptoms who presents today as a referral by his PCP, Dr. Sanda Klein.  Patient was seen in our [place] location several years ago for a stone, but he hasn't had any stone symptoms in the last few years.  He lost his insurance and has recently acquired Medicare.  His PVR was 211 mL on 05/31/2015.   His IPSS score was 18/2 on 05/31/2015.  He has not had hematuria or suprapubic pain.  He has been diagnosed with diabetes recently, as well.    BPH WITH LUTS His IPSS score today is 2, which is mild lower urinary tract symptomatology. He is pleased with his quality life due to his urinary symptoms. His PVR is 15 mL.  His previous IPSS score was 18/2.  His previous PVR is 211 mL.  He denies any dysuria, hematuria or suprapubic pain.  He currently taking tamsulosin 0.4 mg and finasteride 5 mg daily.   He also denies any recent fevers, chills, nausea or vomiting.  He does not have a family history of PCa.      IPSS      09/01/15 1400       International Prostate Symptom Score   How often have you had the sensation of not emptying your bladder? Less than 1 in 5     How often have you had to urinate less than every two hours? Not at All     How often have you found you stopped and started again several times when you urinated? Not at All     How often have you found it difficult to postpone urination? Not at All     How often have you had a weak urinary stream? Not at All     How often have you had to strain to start urination? Not at All     How  many times did you typically get up at night to urinate? 1 Time     Total IPSS Score 2     Quality of Life due to urinary symptoms   If you were to spend the rest of your life with your urinary condition just the way it is now how would you feel about that? Pleased        Score:  1-7 Mild 8-19 Moderate 20-35 Severe    He is experiencing some mild erectile dysfunction.  His wife is ten years younger.       PMH: Past Medical History  Diagnosis Date  . Arthritis     back, hands, knees  . History of skin cancer     hand  . Sleep apnea   . Diabetes mellitus without complication ([place])   . Depression     Surgical History: Past Surgical History  Procedure Laterality Date  . Medial partial knee replacement Left     2000s?  Marland Kitchen Hernia repair    . Knee arthroscopy      several scopes done  . Skin cancer excision  Feb 2017    removed from  right hand    Home Medications:    Medication List       This list is accurate as of: 09/01/15  3:00 PM.  Always use your most recent med list.               aspirin EC 81 MG tablet  Take 1 tablet (81 mg total) by mouth daily.     atorvastatin 10 MG tablet  Commonly known as:  LIPITOR  TAKE 1 TABLET BY MOUTH AT BEDTIME.     buPROPion 150 MG 12 hr tablet  Commonly known as:  WELLBUTRIN SR  Take 1 tablet (150 mg total) by mouth 2 (two) times daily.     lisinopril 5 MG tablet  Commonly known as:  PRINIVIL,ZESTRIL  Take 0.5 tablets (2.5 mg total) by mouth daily.     magnesium gluconate 500 MG tablet  Commonly known as:  MAGONATE  Take 500 mg by mouth daily.     metFORMIN 500 MG tablet  Commonly known as:  GLUCOPHAGE  TAKE 1 TABLET BY MOUTH 2 TIMES DAILY WITH A MEAL.     MULTIVITAMIN ADULT PO  Take 1 tablet by mouth daily.     Potassium 99 MG Tabs  Take 1 tablet by mouth daily.     tamsulosin 0.4 MG Caps capsule  Commonly known as:  FLOMAX  Take 1 capsule (0.4 mg total) by mouth daily.     traMADol 50 MG tablet    Commonly known as:  ULTRAM  Take 1 tablet (50 mg total) by mouth every 6 (six) hours as needed.     vardenafil 20 MG tablet  Commonly known as:  LEVITRA  Take 1 tablet 2 hours prior to intercourse on an empty stomach.     venlafaxine XR 150 MG 24 hr capsule  Commonly known as:  EFFEXOR-XR  TAKE 1 CAPSULE BY MOUTH DAILY        Allergies: No Known Allergies  Family History: Family History  Problem Relation Age of Onset  . Alcohol abuse Mother   . Rheum arthritis Mother   . Stroke Maternal Grandmother   . Stroke Paternal Grandfather   . Cancer Neg Hx   . Diabetes Neg Hx   . Heart disease Neg Hx   . Hypertension Neg Hx   . Kidney disease Neg Hx   . Prostate cancer Neg Hx     Social History:  reports that he has never smoked. He has never used smokeless tobacco. He reports that he does not drink alcohol or use illicit drugs.  ROS: UROLOGY Frequent Urination?: No Hard to postpone urination?: No Burning/pain with urination?: No Get up at night to urinate?: No Leakage of urine?: No Urine stream starts and stops?: No Trouble starting stream?: No Do you have to strain to urinate?: No Blood in urine?: No Urinary tract infection?: No Sexually transmitted disease?: No Injury to kidneys or bladder?: No Painful intercourse?: No Weak stream?: No Erection problems?: No Penile pain?: No  Gastrointestinal Nausea?: No Vomiting?: No Indigestion/heartburn?: No Diarrhea?: No Constipation?: Yes  Constitutional Fever: No Night sweats?: No Weight loss?: No Fatigue?: No  Skin Skin rash/lesions?: No Itching?: No  Eyes Blurred vision?: No Double vision?: No  Ears/Nose/Throat Sore throat?: No Sinus problems?: No  Hematologic/Lymphatic Swollen glands?: No Easy bruising?: No  Cardiovascular Leg swelling?: No Chest pain?: No  Respiratory Cough?: No Shortness of breath?: No  Endocrine Excessive thirst?: No  Musculoskeletal Back pain?: No Joint pain?:  Yes  Neurological Headaches?: No Dizziness?: Yes  Psychologic Depression?: Yes Anxiety?: No  Physical Exam: BP 148/78 mmHg  Pulse 101  Ht 5\' 9"  (1.753 m)  Wt 267 lb 4.8 oz (121.246 kg)  BMI 39.46 kg/m2  Constitutional: Well nourished. Alert and oriented, No acute distress. HEENT: [place] AT, moist mucus membranes. Trachea midline, no masses. Cardiovascular: No clubbing, cyanosis, or edema. Respiratory: Normal respiratory effort, no increased work of breathing. GI: Abdomen is soft, non tender, non distended, no abdominal masses.  Skin: No rashes, bruises or suspicious lesions. Lymph: No cervical or inguinal adenopathy. Neurologic: Grossly intact, no focal deficits, moving all 4 extremities. Psychiatric: Normal mood and affect.  Laboratory Data: Lab Results  Component Value Date   WBC 7.2 11/11/2014   HCT 43.5 11/11/2014   MCV 90 11/11/2014   PLT 293 11/11/2014    Lab Results  Component Value Date   CREATININE 0.95 05/12/2015    PSA History  0.8 ng/mL on 11/12/2014  0.8 ng/mL on 03/19/2015    Lab Results  Component Value Date   HGBA1C 6.2 08/24/2015    Lab Results  Component Value Date   TSH 1.670 05/12/2015    Lab Results  Component Value Date   AST 39 03/19/2015   Lab Results  Component Value Date   ALT 42 03/19/2015     Pertinent Imaging: Results for CHRISTIFER, ETHERTON (MRN [id number]) as of 09/05/2015 10:04  Ref. Range 09/01/2015 14:38  Scan Result Unknown 15     Assessment & Plan:    1. BPH with LUTS:   IPSS score is 2/1.  He will continue the tamsulosin 0.4 mg daily and finasteride 5 mg daily.  He will have his IPSS score, exam and PSA in 6 months.   - BLADDER SCAN AMB NON-IMAGING  2. Diabetes mellitus:   Most recent HgB A1C was 6.2 on 08/24/2015.  3. Erectile dysfunction:   Patient will have SHIM score and exam in 6 months.  Return in about 6 months (around 03/03/2016) for IPSS and exam.  These notes generated with voice recognition  software. I apologize for typographical errors.  Zara Council, [facility] [address] [place], [place] [address] [phone]

## 2015-09-02 DIAGNOSIS — E119 Type 2 diabetes mellitus without complications: Secondary | ICD-10-CM | POA: Diagnosis not present

## 2015-09-02 DIAGNOSIS — Z5181 Encounter for therapeutic drug level monitoring: Secondary | ICD-10-CM | POA: Diagnosis not present

## 2015-09-03 ENCOUNTER — Ambulatory Visit (INDEPENDENT_AMBULATORY_CARE_PROVIDER_SITE_OTHER): Payer: Medicare HMO

## 2015-09-03 VITALS — BP 120/64 | Wt 266.0 lb

## 2015-09-03 DIAGNOSIS — I10 Essential (primary) hypertension: Secondary | ICD-10-CM | POA: Diagnosis not present

## 2015-09-04 LAB — LIPID PANEL W/O CHOL/HDL RATIO
CHOLESTEROL TOTAL: 142 mg/dL (ref 100–199)
HDL: 43 mg/dL (ref >39)
LDL CALC: 78 mg/dL (ref 0–99)
TRIGLYCERIDES: 103 mg/dL (ref 0–149)
VLDL CHOLESTEROL CAL: 21 mg/dL (ref 5–40)

## 2015-09-04 LAB — ALT: ALT: 35 IU/L (ref 0–44)

## 2015-09-05 ENCOUNTER — Encounter: Payer: Self-pay | Admitting: Family Medicine

## 2015-09-05 DIAGNOSIS — N138 Other obstructive and reflux uropathy: Secondary | ICD-10-CM | POA: Insufficient documentation

## 2015-09-05 DIAGNOSIS — N529 Male erectile dysfunction, unspecified: Secondary | ICD-10-CM | POA: Insufficient documentation

## 2015-09-05 DIAGNOSIS — N401 Enlarged prostate with lower urinary tract symptoms: Principal | ICD-10-CM

## 2015-09-09 DIAGNOSIS — G4733 Obstructive sleep apnea (adult) (pediatric): Secondary | ICD-10-CM | POA: Diagnosis not present

## 2015-09-10 ENCOUNTER — Ambulatory Visit: Payer: Medicare HMO | Admitting: Family Medicine

## 2015-09-11 NOTE — Progress Notes (Signed)
Recheck BP was noted; initially, I instructed nurse to have him hold his low-dose ACE-I; I then saw another BP from another office that was higher, so I e-mailed him to start back at low dose

## 2015-09-12 ENCOUNTER — Encounter: Payer: Self-pay | Admitting: Family Medicine

## 2015-09-12 NOTE — Assessment & Plan Note (Signed)
Encouraged weight loss; see AVS 

## 2015-09-21 DIAGNOSIS — H40033 Anatomical narrow angle, bilateral: Secondary | ICD-10-CM | POA: Diagnosis not present

## 2015-10-01 ENCOUNTER — Other Ambulatory Visit: Payer: Self-pay | Admitting: Family Medicine

## 2015-10-01 MED ORDER — ATORVASTATIN CALCIUM 10 MG PO TABS: 10.0000 mg | ORAL_TABLET | Freq: Every day | ORAL | Status: DC

## 2015-10-01 MED ORDER — TRAMADOL HCL 50 MG PO TABS: 50.0000 mg | ORAL_TABLET | Freq: Four times a day (QID) | ORAL | Status: DC | PRN

## 2015-10-01 NOTE — Telephone Encounter (Signed)
BP 138-148 at the clinic; let's start back lisinopril 2.5 mg daily; if not under 140, then increase to 5 mg daily; keep me posted; tramadol is working well, using just a few times a week; LDL came down nicely with statin; refills called in personally to [place]

## 2015-10-01 NOTE — Telephone Encounter (Signed)
Pt needs refills on Tramodol and also Atorvastatin. Pt also states his BP has been running 140 plus and would like a call back to be advised if he needs to change his dosage.

## 2015-10-10 DIAGNOSIS — G4733 Obstructive sleep apnea (adult) (pediatric): Secondary | ICD-10-CM | POA: Diagnosis not present

## 2015-10-15 DIAGNOSIS — R69 Illness, unspecified: Secondary | ICD-10-CM | POA: Diagnosis not present

## 2015-10-18 ENCOUNTER — Telehealth: Payer: Self-pay | Admitting: Family Medicine

## 2015-10-18 MED ORDER — VENLAFAXINE HCL ER 150 MG PO CP24: 150.0000 mg | ORAL_CAPSULE | Freq: Every day | ORAL | Status: DC

## 2015-10-18 NOTE — Telephone Encounter (Signed)
Rx sent 

## 2015-10-18 NOTE — Telephone Encounter (Signed)
Pt states he is out of town and forgot to take his Venlafaxise to be sent to [facility].

## 2015-11-01 DIAGNOSIS — E1169 Type 2 diabetes mellitus with other specified complication: Secondary | ICD-10-CM | POA: Diagnosis not present

## 2015-11-01 DIAGNOSIS — K122 Cellulitis and abscess of mouth: Secondary | ICD-10-CM | POA: Diagnosis not present

## 2015-11-04 ENCOUNTER — Telehealth: Payer: Self-pay | Admitting: Family Medicine

## 2015-11-04 MED ORDER — HYDROCODONE-ACETAMINOPHEN 5-325 MG PO TABS: 1.0000 | ORAL_TABLET | Freq: Four times a day (QID) | ORAL | Status: DC | PRN

## 2015-11-04 NOTE — Telephone Encounter (Signed)
Rx ready.

## 2015-11-04 NOTE — Telephone Encounter (Signed)
Pt informed

## 2015-11-04 NOTE — Telephone Encounter (Signed)
Pt states he had dental surgery on 10/26/2015 to remove a implant. Pt states that Dr Toy Cookey did not give him any pain meds and Dr Toy Cookey  is out of town and will not return until 11/08/2015. Pt went to [facility] 10/29/2015 due to his pain and he was given Hydrocdone and he is now down to 2 pills and he is requesting to get enough to last until Dr Toy Cookey returns to the office. Please advise.

## 2015-11-09 DIAGNOSIS — G4733 Obstructive sleep apnea (adult) (pediatric): Secondary | ICD-10-CM | POA: Diagnosis not present

## 2015-11-12 ENCOUNTER — Other Ambulatory Visit: Payer: Self-pay | Admitting: Family Medicine

## 2015-11-12 NOTE — Telephone Encounter (Signed)
Last cr reviewed; rx approved

## 2015-11-12 NOTE — Telephone Encounter (Signed)
PT NEEDS METFORMIN

## 2016-01-04 DIAGNOSIS — G4733 Obstructive sleep apnea (adult) (pediatric): Secondary | ICD-10-CM | POA: Diagnosis not present

## 2016-01-06 ENCOUNTER — Encounter: Payer: Self-pay | Admitting: *Deleted

## 2016-01-10 ENCOUNTER — Other Ambulatory Visit: Payer: Self-pay | Admitting: Family Medicine

## 2016-01-10 DIAGNOSIS — G4733 Obstructive sleep apnea (adult) (pediatric): Secondary | ICD-10-CM | POA: Diagnosis not present

## 2016-01-26 ENCOUNTER — Other Ambulatory Visit: Payer: Self-pay | Admitting: Family Medicine

## 2016-01-26 DIAGNOSIS — Z5181 Encounter for therapeutic drug level monitoring: Secondary | ICD-10-CM

## 2016-01-26 NOTE — Assessment & Plan Note (Signed)
Check K+ 

## 2016-01-26 NOTE — Telephone Encounter (Signed)
He has appt in Oct I called pt, just realized he has potassium OTC in his med list Stop that Explained ACE-I can increase K+, get BMP tomorrow; he agrees He is completely asymptomatic

## 2016-01-27 ENCOUNTER — Other Ambulatory Visit: Payer: Self-pay | Admitting: Family Medicine

## 2016-01-27 DIAGNOSIS — Z5181 Encounter for therapeutic drug level monitoring: Secondary | ICD-10-CM

## 2016-01-27 LAB — BASIC METABOLIC PANEL WITH GFR
BUN: 13 mg/dL (ref 7–25)
CALCIUM: 8.8 mg/dL (ref 8.6–10.3)
CO2: 24 mmol/L (ref 20–31)
Chloride: 105 mmol/L (ref 98–110)
Creat: 1.02 mg/dL (ref 0.70–1.25)
GFR, EST AFRICAN AMERICAN: 88 mL/min (ref >=60)
GFR, Est Non African American: 76 mL/min (ref >=60)
GLUCOSE: 148 mg/dL — AB (ref 65–99)
Potassium: 4.6 mmol/L (ref 3.5–5.3)
Sodium: 141 mmol/L (ref 135–146)

## 2016-02-09 DIAGNOSIS — G4733 Obstructive sleep apnea (adult) (pediatric): Secondary | ICD-10-CM | POA: Diagnosis not present

## 2016-02-24 ENCOUNTER — Ambulatory Visit (INDEPENDENT_AMBULATORY_CARE_PROVIDER_SITE_OTHER): Payer: Medicare HMO | Admitting: Family Medicine

## 2016-02-24 ENCOUNTER — Encounter: Payer: Self-pay | Admitting: Family Medicine

## 2016-02-24 VITALS — BP 110/62 | HR 111 | Temp 98.1°F | Resp 18 | Ht 69.0 in | Wt 262.7 lb

## 2016-02-24 DIAGNOSIS — Z79899 Other long term (current) drug therapy: Secondary | ICD-10-CM

## 2016-02-24 DIAGNOSIS — F330 Major depressive disorder, recurrent, mild: Secondary | ICD-10-CM | POA: Diagnosis not present

## 2016-02-24 DIAGNOSIS — Z5181 Encounter for therapeutic drug level monitoring: Secondary | ICD-10-CM | POA: Diagnosis not present

## 2016-02-24 DIAGNOSIS — G8929 Other chronic pain: Secondary | ICD-10-CM

## 2016-02-24 DIAGNOSIS — M25561 Pain in right knee: Secondary | ICD-10-CM | POA: Diagnosis not present

## 2016-02-24 DIAGNOSIS — E6609 Other obesity due to excess calories: Secondary | ICD-10-CM | POA: Diagnosis not present

## 2016-02-24 DIAGNOSIS — E781 Pure hyperglyceridemia: Secondary | ICD-10-CM

## 2016-02-24 DIAGNOSIS — IMO0001 Reserved for inherently not codable concepts without codable children: Secondary | ICD-10-CM

## 2016-02-24 DIAGNOSIS — I10 Essential (primary) hypertension: Secondary | ICD-10-CM | POA: Diagnosis not present

## 2016-02-24 DIAGNOSIS — Z23 Encounter for immunization: Secondary | ICD-10-CM | POA: Diagnosis not present

## 2016-02-24 DIAGNOSIS — M25562 Pain in left knee: Secondary | ICD-10-CM

## 2016-02-24 DIAGNOSIS — Z6838 Body mass index (BMI) 38.0-38.9, adult: Secondary | ICD-10-CM

## 2016-02-24 DIAGNOSIS — E119 Type 2 diabetes mellitus without complications: Secondary | ICD-10-CM

## 2016-02-24 DIAGNOSIS — R69 Illness, unspecified: Secondary | ICD-10-CM | POA: Diagnosis not present

## 2016-02-24 MED ORDER — DULOXETINE HCL 60 MG PO CPEP: 60.0000 mg | ORAL_CAPSULE | Freq: Every day | ORAL | 3 refills | Status: DC

## 2016-02-24 MED ORDER — TRAMADOL HCL 50 MG PO TABS: 50.0000 mg | ORAL_TABLET | Freq: Four times a day (QID) | ORAL | 2 refills | Status: DC | PRN

## 2016-02-24 NOTE — Assessment & Plan Note (Addendum)
Limit saturated fats; check fasting lipids next week

## 2016-02-24 NOTE — Patient Instructions (Addendum)
Stop venlafaxine and start Cymbalta (duloxetine) If you have any dark thoughts, such as hurting yourself or others, please seek immediate medical care Try to limit saturated fats in your diet (bologna, hot dogs, barbeque, cheeseburgers, hamburgers, steak, bacon, sausage, cheese, etc.) and get more fresh fruits, vegetables, and whole grains Avoid salt substitutes Return next week for labs

## 2016-02-24 NOTE — Assessment & Plan Note (Signed)
Last A1c was 6.2; check next week; limit sweet drinks, white bread, white potatoes, white rice

## 2016-02-24 NOTE — Progress Notes (Signed)
BP 110/62 (BP Location: Left Arm, Patient Position: Sitting, Cuff Size: Large)   Pulse (!) 111   Temp 98.1 F (36.7 C) (Oral)   Resp 18   Ht 5\' 9"  (1.753 m)   Wt 262 lb 11.2 oz (119.2 kg)   SpO2 98%   BMI 38.79 kg/m    Subjective:    Patient ID: Duane Hughes, male    DOB: 07/28/1950, 65 y.o.   MRN: [id number]  HPI: Duane Hughes is a 65 y.o. male  Chief Complaint  Patient presents with  . Medication Refill  . Depression    Has become more frequent  . Knee Pain  . Diabetes  . Hypertension   He is working on weight loss; doing fruit smoothies; no salt substitutes Type 2 diabetes; has not been checking sugars; not back to checking; dry mouth now and then Obesity; working on weight BP; controlled on half of a 5 mg pill Depression has been more frequent; more bouts lately; not feeling as good as he should; he has had a few bad bouts; he gets a little depressed after his golf outing with his boys; doesn't seem them for months; early October, negative nasty thoughts; couple of rounds this months; same months for two years He has never stopped the bupropion since we started; once he didn't have the bupropion for a week and had a bad week The other prescribed in the [place] was a [facility] prescriber for an acute pain issue; no alcohol Bilateral knee pain; lateral partial knee replacement on the right; has pain along the medial aspect of the left side  Depression screen [facility] 2/9 02/24/2016 08/24/2015 05/17/2015 12/17/2014  Decreased Interest 1 0 0 0  Down, Depressed, Hopeless 0 0 0 0  PHQ - 2 Score 1 0 0 0   Relevant past medical, surgical, family and social history reviewed Past Medical History:  Diagnosis Date  . Arthritis    back, hands, knees  . Depression   . Diabetes mellitus without complication ([place])   . History of skin cancer    hand  . Obesity 11/11/2014  . Sleep apnea    Past Surgical History:  Procedure Laterality Date  . HERNIA REPAIR    . KNEE ARTHROSCOPY      several scopes done  . MEDIAL PARTIAL KNEE REPLACEMENT Left    2000s?  Marland Kitchen SKIN CANCER EXCISION  Feb 2017   removed from right hand   Family History  Problem Relation Age of Onset  . Alcohol abuse Mother   . Rheum arthritis Mother   . Stroke Maternal Grandmother   . Stroke Paternal Grandfather   . Cancer Neg Hx   . Diabetes Neg Hx   . Heart disease Neg Hx   . Hypertension Neg Hx   . Kidney disease Neg Hx   . Prostate cancer Neg Hx    Social History  Substance Use Topics  . Smoking status: Never Smoker  . Smokeless tobacco: Never Used  . Alcohol use No   Interim medical history since last visit reviewed. Allergies and medications reviewed  Review of Systems Per HPI unless specifically indicated above     Objective:    BP 110/62 (BP Location: Left Arm, Patient Position: Sitting, Cuff Size: Large)   Pulse (!) 111   Temp 98.1 F (36.7 C) (Oral)   Resp 18   Ht 5\' 9"  (1.753 m)   Wt 262 lb 11.2 oz (119.2 kg)   SpO2 98%  BMI 38.79 kg/m   Wt Readings from Last 3 Encounters:  03/01/16 262 lb 9.6 oz (119.1 kg)  02/24/16 262 lb 11.2 oz (119.2 kg)  09/03/15 266 lb (120.7 kg)    Physical Exam  Constitutional: He appears well-developed and well-nourished. No distress.  obese  HENT:  Head: Normocephalic and atraumatic.  Eyes: EOM are normal. No scleral icterus.  Neck: No thyromegaly present.  Cardiovascular: Normal rate and regular rhythm.   Pulmonary/Chest: Effort normal and breath sounds normal.  Abdominal: Soft. Bowel sounds are normal. He exhibits no distension.  Musculoskeletal: He exhibits no edema.  Neurological: He displays no tremor.  Skin: Skin is warm and dry. No pallor.  Psychiatric: He has a normal mood and affect. Judgment normal. His mood appears not anxious. Cognition and memory are normal. He does not exhibit a depressed mood. He expresses no homicidal and no suicidal ideation.    Diabetic Foot Form - Detailed   Diabetic Foot Exam -  detailed Diabetic Foot exam was performed with the following findings:  Yes 02/24/2016  3:32 PM  Visual Foot Exam completed.:  Yes  Are the toenails ingrown?:  No Normal Range of Motion:  Yes Pulse Foot Exam completed.:  Yes  Right Dorsalis Pedis:  Present Left Dorsalis Pedis:  Present  Sensory Foot Exam Completed.:  Yes Semmes-Weinstein Monofilament Test R Site 1-Great Toe:  Pos L Site 1-Great Toe:  Pos  R Site 4:  Pos L Site 4:  Pos  R Site 5:  Pos L Site 5:  Pos        Results for orders placed or performed in visit on [id number]  BASIC METABOLIC PANEL WITH GFR  Result Value Ref Range   Sodium 141 135 - 146 mmol/L   Potassium 4.6 3.5 - 5.3 mmol/L   Chloride 105 98 - 110 mmol/L   CO2 24 20 - 31 mmol/L   Glucose, Bld 148 (H) 65 - 99 mg/dL   BUN 13 7 - 25 mg/dL   Creat 1.02 0.70 - 1.25 mg/dL   Calcium 8.8 8.6 - 10.3 mg/dL   GFR, Est African American 88 >=60 mL/min   GFR, Est Non African American 76 >=60 mL/min      Assessment & Plan:   Problem List Items Addressed This Visit      Cardiovascular and Mediastinum   Benign hypertension    Excellent control; continue low dose ACE-I; avoid salt substitutes        Endocrine   Diabetes mellitus without complication (HCC) - Primary    Last A1c was 6.2; check next week; limit sweet drinks, white bread, white potatoes, white rice      Relevant Orders   Hemoglobin A1c     Other   Obesity    BMI now below 40; encouraged him to keep working on this      Needs flu shot   Relevant Orders   Flu vaccine HIGH DOSE PF (Fluzone High dose) (Completed)   Medication monitoring encounter    Monitor liver and kidney funciton      Relevant Orders   COMPLETE METABOLIC PANEL WITH GFR   Major depressive disorder, recurrent (HCC)    Will switch agents; patient to seek immediate help if any dark thoughts or thoughts of self-harm; he agrees; call me with any concerns      Relevant Medications   DULoxetine (CYMBALTA) 60 MG capsule    Knee pain, bilateral    Refill the tramadol, use that as needed  Hypertriglyceridemia    Limit saturated fats; check fasting lipids next week      Relevant Orders   Lipid panel   Controlled substance agreement signed    Reviewed [facility] web site; okay for refills       Other Visit Diagnoses   None.      Follow up plan: Return in about 3 months (around 05/26/2016) for diabetes, medical follow-up; also, return in 4 weeks for mood follow-up.  An after-visit summary was printed and given to the patient at [facility].  Please see the patient instructions which may contain other information and recommendations beyond what is mentioned above in the assessment and plan.  Meds ordered this encounter  Medications  . DULoxetine (CYMBALTA) 60 MG capsule    Sig: Take 1 capsule (60 mg total) by mouth daily. (this replaces venlafaxine)    Dispense:  30 capsule    Refill:  3    STOP the venlafaxine  . traMADol (ULTRAM) 50 MG tablet    Sig: Take 1 tablet (50 mg total) by mouth every 6 (six) hours as needed.    Dispense:  30 tablet    Refill:  2    Orders Placed This Encounter  Procedures  . Flu vaccine HIGH DOSE PF (Fluzone High dose)  . Hemoglobin A1c  . Lipid panel  . COMPLETE METABOLIC PANEL WITH GFR

## 2016-02-24 NOTE — Assessment & Plan Note (Signed)
Reviewed [facility] web site; okay for refills

## 2016-02-24 NOTE — Assessment & Plan Note (Signed)
Refill the tramadol, use that as needed

## 2016-02-24 NOTE — Assessment & Plan Note (Signed)
Excellent control; continue low dose ACE-I; avoid salt substitutes

## 2016-02-28 ENCOUNTER — Other Ambulatory Visit: Payer: Medicare HMO

## 2016-02-28 DIAGNOSIS — N400 Enlarged prostate without lower urinary tract symptoms: Secondary | ICD-10-CM

## 2016-02-29 LAB — PSA: Prostate Specific Ag, Serum: 1 ng/mL (ref 0.0–4.0)

## 2016-03-01 ENCOUNTER — Encounter: Payer: Self-pay | Admitting: Urology

## 2016-03-01 ENCOUNTER — Ambulatory Visit: Payer: Medicare HMO | Admitting: Urology

## 2016-03-01 VITALS — BP 121/81 | HR 98 | Ht 69.0 in | Wt 262.6 lb

## 2016-03-01 DIAGNOSIS — N138 Other obstructive and reflux uropathy: Secondary | ICD-10-CM

## 2016-03-01 DIAGNOSIS — N529 Male erectile dysfunction, unspecified: Secondary | ICD-10-CM

## 2016-03-01 DIAGNOSIS — N401 Enlarged prostate with lower urinary tract symptoms: Secondary | ICD-10-CM | POA: Diagnosis not present

## 2016-03-01 MED ORDER — TAMSULOSIN HCL 0.4 MG PO CAPS: 0.4000 mg | ORAL_CAPSULE | Freq: Every day | ORAL | 4 refills | Status: DC

## 2016-03-01 NOTE — Progress Notes (Signed)
8:18 PM   Duane Hughes [birth year] [id number]  Referring provider: Arnetha Courser, MD [address] [place], [place] [address]  Chief Complaint  Patient presents with  . Benign Prostatic Hypertrophy    HPI: Patient is a 65 year old Caucasian male who presents today for 6 month follow-up for erectile dysfunction and BPH with lower urinary tract symptoms.    Erectile dysfunction His SHIM score is 21, which is mild erectile dysfunction.   He has been having difficulty with erections for several months. His major complaint is not being able to climax.  His libido is preserved.  His risk factors for ED are age, BPH, DM, HTN, HLD, sleep apnea, depression, antidepressants, pain medication and blood pressure medications. He denies any painful erections or curvatures with his erections.   He has tried PDE5-inhibitors in the past with success.      SHIM    Row Name 03/01/16 1530         SHIM: Over the last 6 months:   How do you rate your confidence that you could get and keep an erection? High     When you had erections with sexual stimulation, how often were your erections hard enough for penetration (entering your partner)? Almost Always or Always     During sexual intercourse, how often were you able to maintain your erection after you had penetrated (entered) your partner? Not Difficult     During sexual intercourse, how difficult was it to maintain your erection to completion of intercourse? Slightly Difficult     When you attempted sexual intercourse, how often was it satisfactory for you? Difficult       SHIM Total Score   SHIM 21        Score: 1-7 Severe ED 8-11 Moderate ED 12-16 Mild-Moderate ED 17-21 Mild ED 22-25 No ED  BPH WITH LUTS His IPSS score today is 3, which is mild lower urinary tract symptomatology. He is mostly satisfied with his quality life due to his urinary symptoms.  His previous IPSS score was 2/1.  His previous PVR is 15 mL.  His  urinary complaints consist of mild urinary leakage. He denies any dysuria, hematuria or suprapubic pain.  He currently taking tamsulosin 0.4 mg.  He also denies any recent fevers, chills, nausea or vomiting.  He does not have a family history of PCa.      IPSS    Row Name 03/01/16 1500         International Prostate Symptom Score   How often have you had the sensation of not emptying your bladder? Not at All     How often have you had to urinate less than every two hours? Not at All     How often have you found you stopped and started again several times when you urinated? Not at All     How often have you found it difficult to postpone urination? Less than half the time     How often have you had a weak urinary stream? Not at All     How often have you had to strain to start urination? Not at All     How many times did you typically get up at night to urinate? 1 Time     Total IPSS Score 3       Quality of Life due to urinary symptoms   If you were to spend the rest of your life with your urinary condition  just the way it is now how would you feel about that? Mostly Satisfied        Score:  1-7 Mild 8-19 Moderate 20-35 Severe   PMH: Past Medical History:  Diagnosis Date  . Arthritis    back, hands, knees  . Depression   . Diabetes mellitus without complication ([place])   . History of skin cancer    hand  . Obesity 11/11/2014  . Sleep apnea     Surgical History: Past Surgical History:  Procedure Laterality Date  . HERNIA REPAIR    . KNEE ARTHROSCOPY     several scopes done  . MEDIAL PARTIAL KNEE REPLACEMENT Left    2000s?  Marland Kitchen SKIN CANCER EXCISION  Feb 2017   removed from right hand    Home Medications:    Medication List       Accurate as of 03/01/16 11:59 PM. Always use your most recent med list.          aspirin EC 81 MG tablet Take 1 tablet (81 mg total) by mouth daily.   atorvastatin 10 MG tablet Commonly known as:  LIPITOR Take 1 tablet (10 mg  total) by mouth at bedtime.   buPROPion 150 MG 12 hr tablet Commonly known as:  WELLBUTRIN SR Take 1 tablet (150 mg total) by mouth 2 (two) times daily.   DULoxetine 60 MG capsule Commonly known as:  CYMBALTA Take 1 capsule (60 mg total) by mouth daily. (this replaces venlafaxine)   lisinopril 5 MG tablet Commonly known as:  PRINIVIL,ZESTRIL Take 0.5 tablets (2.5 mg total) by mouth daily.   magnesium gluconate 500 MG tablet Commonly known as:  MAGONATE Take 500 mg by mouth daily.   metFORMIN 500 MG tablet Commonly known as:  GLUCOPHAGE TAKE 1 TABLET BY MOUTH 2 TIMES DAILY WITH A MEAL.   MULTIVITAMIN ADULT PO Take 1 tablet by mouth daily.   tamsulosin 0.4 MG Caps capsule Commonly known as:  FLOMAX Take 1 capsule (0.4 mg total) by mouth daily.   traMADol 50 MG tablet Commonly known as:  ULTRAM Take 1 tablet (50 mg total) by mouth every 6 (six) hours as needed.   vardenafil 20 MG tablet Commonly known as:  LEVITRA Take 1 tablet 2 hours prior to intercourse on an empty stomach.       Allergies: No Known Allergies  Family History: Family History  Problem Relation Age of Onset  . Alcohol abuse Mother   . Rheum arthritis Mother   . Stroke Maternal Grandmother   . Stroke Paternal Grandfather   . Cancer Neg Hx   . Diabetes Neg Hx   . Heart disease Neg Hx   . Hypertension Neg Hx   . Kidney disease Neg Hx   . Prostate cancer Neg Hx     Social History:  reports that he has never smoked. He has never used smokeless tobacco. He reports that he does not drink alcohol or use drugs.  ROS: UROLOGY Frequent Urination?: No Hard to postpone urination?: No Burning/pain with urination?: No Get up at night to urinate?: No Leakage of urine?: Yes Urine stream starts and stops?: No Trouble starting stream?: No Do you have to strain to urinate?: No Blood in urine?: No Urinary tract infection?: No Sexually transmitted disease?: No Injury to kidneys or bladder?: No Painful  intercourse?: No Weak stream?: No Erection problems?: No Penile pain?: No  Gastrointestinal Nausea?: No Vomiting?: No Indigestion/heartburn?: No Diarrhea?: No Constipation?: No  Constitutional Fever: No Night  sweats?: No Weight loss?: No Fatigue?: No  Skin Skin rash/lesions?: No Itching?: No  Eyes Blurred vision?: No Double vision?: No  Ears/Nose/Throat Sore throat?: No Sinus problems?: No  Hematologic/Lymphatic Swollen glands?: No Easy bruising?: No  Cardiovascular Leg swelling?: No Chest pain?: No  Respiratory Cough?: No Shortness of breath?: No  Endocrine Excessive thirst?: No  Musculoskeletal Back pain?: Yes Joint pain?: Yes  Neurological Headaches?: No Dizziness?: No  Psychologic Depression?: Yes Anxiety?: No  Physical Exam: BP 121/81 (BP Location: Left Arm, Patient Position: Sitting, Cuff Size: Large)   Pulse 98   Ht 5\' 9"  (1.753 m)   Wt 262 lb 9.6 oz (119.1 kg)   BMI 38.78 kg/m   Constitutional: Well nourished. Alert and oriented, No acute distress. HEENT: [place] AT, moist mucus membranes. Trachea midline, no masses. Cardiovascular: No clubbing, cyanosis, or edema. Respiratory: Normal respiratory effort, no increased work of breathing. GI: Abdomen is soft, non tender, non distended, no abdominal masses. Liver and spleen not palpable.  No hernias appreciated.  Stool sample for occult testing is not indicated.   GU: No CVA tenderness.  No bladder fullness or masses.  Patient with circumcised phallus.  Urethral meatus is patent.  No penile discharge. No penile lesions or rashes. Scrotum without lesions, cysts, rashes and/or edema.  Testicles are located scrotally bilaterally. No masses are appreciated in the testicles. Left and right epididymis are normal. Rectal: Patient with  normal sphincter tone. Anus and perineum without scarring or rashes. No rectal masses are appreciated. Prostate is approximately 55 grams, no nodules are appreciated.  Seminal vesicles are normal. Skin: No rashes, bruises or suspicious lesions. Lymph: No cervical or inguinal adenopathy. Neurologic: Grossly intact, no focal deficits, moving all 4 extremities. Psychiatric: Normal mood and affect.  Laboratory Data: Lab Results  Component Value Date   WBC 7.2 11/11/2014   HCT 43.5 11/11/2014   MCV 90 11/11/2014   PLT 293 11/11/2014    Lab Results  Component Value Date   CREATININE 1.02 01/27/2016    PSA History  0.8 ng/mL on 11/12/2014  0.8 ng/mL on 03/19/2015  1.0 ng/mL on 02/28/2016    Lab Results  Component Value Date   HGBA1C 6.2 08/24/2015    Lab Results  Component Value Date   TSH 1.670 05/12/2015    Lab Results  Component Value Date   AST 39 03/19/2015   Lab Results  Component Value Date   ALT 35 09/02/2015     Assessment & Plan:    1. Erectile dysfunction  - SHIM score is 21   - I explained to the patient that in order to achieve an erection it takes good functioning of the nervous system (parasympathetic, sympathetic, sensory and motor), good blood flow into the erectile tissue of the penis and a desire to have sex  - I explained that conditions like diabetes, hypertension, coronary artery disease, peripheral vascular disease, smoking, alcohol consumption, age, sleep apnea and BPH can diminish the ability to have an erection  - I stated that I would do further research into his inability to climax and contact him later with my findings  - RTC in 12 months for repeat SHIM score and exam   2. BPH with LUTS  - IPSS score is 3/2, it is stable  - Continue conservative management, avoiding bladder irritants and timed voiding's  - Continue tamsulosin 0.4 mg daily: refill given   - RTC in 12 months for IPSS, PSA and exam   Return in about  1 year (around 03/01/2017) for IPSS, SHIM, PSA and exam.  These notes generated with voice recognition software. I apologize for typographical errors.  Zara Council,  [facility] [address] [place], [place] [address] [phone]

## 2016-03-02 ENCOUNTER — Encounter: Payer: Self-pay | Admitting: Family Medicine

## 2016-03-02 NOTE — Assessment & Plan Note (Signed)
BMI now below 40; encouraged him to keep working on this

## 2016-03-02 NOTE — Assessment & Plan Note (Signed)
Will switch agents; patient to seek immediate help if any dark thoughts or thoughts of self-harm; he agrees; call me with any concerns

## 2016-03-02 NOTE — Assessment & Plan Note (Signed)
Monitor liver and kidney funciton

## 2016-03-08 DIAGNOSIS — L570 Actinic keratosis: Secondary | ICD-10-CM | POA: Diagnosis not present

## 2016-03-08 DIAGNOSIS — Z1283 Encounter for screening for malignant neoplasm of skin: Secondary | ICD-10-CM | POA: Diagnosis not present

## 2016-03-08 DIAGNOSIS — D485 Neoplasm of uncertain behavior of skin: Secondary | ICD-10-CM | POA: Diagnosis not present

## 2016-03-08 DIAGNOSIS — Z85828 Personal history of other malignant neoplasm of skin: Secondary | ICD-10-CM | POA: Diagnosis not present

## 2016-03-08 DIAGNOSIS — L812 Freckles: Secondary | ICD-10-CM | POA: Diagnosis not present

## 2016-03-08 DIAGNOSIS — D1800 Hemangioma unspecified site: Secondary | ICD-10-CM | POA: Diagnosis not present

## 2016-03-08 DIAGNOSIS — L578 Other skin changes due to chronic exposure to nonionizing radiation: Secondary | ICD-10-CM | POA: Diagnosis not present

## 2016-03-08 DIAGNOSIS — L821 Other seborrheic keratosis: Secondary | ICD-10-CM | POA: Diagnosis not present

## 2016-03-08 DIAGNOSIS — L820 Inflamed seborrheic keratosis: Secondary | ICD-10-CM | POA: Diagnosis not present

## 2016-03-11 DIAGNOSIS — G4733 Obstructive sleep apnea (adult) (pediatric): Secondary | ICD-10-CM | POA: Diagnosis not present

## 2016-03-12 ENCOUNTER — Telehealth: Payer: Self-pay | Admitting: Urology

## 2016-03-12 NOTE — Telephone Encounter (Signed)
I have done further research into [name] issues.  If he goes to the website [email or web address], the product Viberect, Pelvic Rx and ACTIVESINNFLUT are products that should help.

## 2016-03-13 ENCOUNTER — Other Ambulatory Visit: Payer: Self-pay | Admitting: Family Medicine

## 2016-03-13 NOTE — Telephone Encounter (Signed)
Sept 2017 creatinine reviewed; Rx approved

## 2016-03-13 NOTE — Telephone Encounter (Signed)
Spoke with pt in reference to web site. Pt voiced understanding.

## 2016-03-20 ENCOUNTER — Ambulatory Visit (INDEPENDENT_AMBULATORY_CARE_PROVIDER_SITE_OTHER): Payer: Medicare HMO | Admitting: Family Medicine

## 2016-03-20 ENCOUNTER — Encounter: Payer: Self-pay | Admitting: Family Medicine

## 2016-03-20 DIAGNOSIS — F330 Major depressive disorder, recurrent, mild: Secondary | ICD-10-CM

## 2016-03-20 DIAGNOSIS — M25561 Pain in right knee: Secondary | ICD-10-CM

## 2016-03-20 DIAGNOSIS — M25562 Pain in left knee: Secondary | ICD-10-CM

## 2016-03-20 DIAGNOSIS — M5441 Lumbago with sciatica, right side: Secondary | ICD-10-CM | POA: Diagnosis not present

## 2016-03-20 DIAGNOSIS — M5442 Lumbago with sciatica, left side: Secondary | ICD-10-CM

## 2016-03-20 DIAGNOSIS — G8929 Other chronic pain: Secondary | ICD-10-CM

## 2016-03-20 MED ORDER — DULOXETINE HCL 60 MG PO CPEP: 60.0000 mg | ORAL_CAPSULE | Freq: Every day | ORAL | 5 refills | Status: DC

## 2016-03-20 MED ORDER — HYDROCODONE-ACETAMINOPHEN 5-325 MG PO TABS: 1.0000 | ORAL_TABLET | Freq: Four times a day (QID) | ORAL | 0 refills | Status: DC | PRN

## 2016-03-20 NOTE — Progress Notes (Signed)
BP 122/68   Pulse 97   Temp 98.4 F (36.9 C) (Oral)   Resp 16   Wt 262 lb (118.8 kg)   SpO2 93%   BMI 38.69 kg/m    Subjective:    Patient ID: Duane Hughes, male    DOB: 12-Apr-1951, 65 y.o.   MRN: [id number]  HPI: Duane Hughes is a 65 y.o. male  Chief Complaint  Patient presents with  . 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; degenerative spurring on imaging from 2008; tramadol does not touch it; he has just been living with it; the ibuprofen doesn't help; worse at night after standing or sitting; no blood in stool or urine; feels pretty good  Switched antidepressant; no manic or hypomanic symptoms; he is doing well; glad to have made the switch; feeling better  Uses tramadol from time to time for bilateral knee pain  Diabetes; checking sugars  Depression screen [facility] 2/9 03/20/2016 02/24/2016 08/24/2015 05/17/2015 12/17/2014  Decreased Interest 0 1 0 0 0  Down, Depressed, Hopeless 1 0 0 0 0  PHQ - 2 Score 1 1 0 0 0   Relevant past medical, surgical, family and social history reviewed Past Medical History:  Diagnosis Date  . Arthritis    back, hands, knees  . Depression   . Diabetes mellitus without complication ([place])   . History of skin cancer    hand  . Obesity 11/11/2014  . Sleep apnea    Past Surgical History:  Procedure Laterality Date  . HERNIA REPAIR    . KNEE ARTHROSCOPY     several scopes done  . MEDIAL PARTIAL KNEE REPLACEMENT Left    2000s?  Marland Kitchen SKIN CANCER EXCISION  Feb 2017   removed from right hand   Family History  Problem Relation Age of Onset  . Alcohol abuse Mother   . Rheum arthritis Mother   . Stroke Maternal Grandmother   . Stroke Paternal Grandfather   . Cancer Neg Hx   . Diabetes Neg Hx   . Heart disease Neg Hx   . Hypertension Neg Hx   . Kidney disease Neg Hx   . Prostate cancer Neg Hx    Social History  Substance Use Topics  . Smoking status: Never Smoker  . Smokeless  tobacco: Never Used  . Alcohol use No   Interim medical history since last visit reviewed. Allergies and medications reviewed  Review of Systems Per HPI unless specifically indicated above     Objective:    BP 122/68   Pulse 97   Temp 98.4 F (36.9 C) (Oral)   Resp 16   Wt 262 lb (118.8 kg)   SpO2 93%   BMI 38.69 kg/m   Wt Readings from Last 3 Encounters:  03/20/16 262 lb (118.8 kg)  03/01/16 262 lb 9.6 oz (119.1 kg)  02/24/16 262 lb 11.2 oz (119.2 kg)    Physical Exam  Constitutional: He appears well-developed and well-nourished.  Obesity; weight stable  Cardiovascular: Normal rate.   Pulmonary/Chest: Effort normal.  Neurological: He is alert.  Psychiatric: He has a normal mood and affect. His behavior is normal. Judgment and thought content normal.   Diabetic Foot Form - Detailed   Diabetic Foot Exam - detailed Diabetic Foot exam was performed with the following findings:  Yes 03/20/2016 12:30 PM  Visual Foot Exam completed.:  Yes  Are the toenails ingrown?:  No Normal Range of Motion:  Yes Pulse  Foot Exam completed.:  Yes  Right Dorsalis Pedis:  Present Left Dorsalis Pedis:  Present  Sensory Foot Exam Completed.:  Yes Semmes-Weinstein Monofilament Test R Site 1-Great Toe:  Pos L Site 1-Great Toe:  Pos  R Site 4:  Pos L Site 4:  Pos  R Site 5:  Pos L Site 5:  Pos       Results for orders placed or performed in visit on 02/28/16  PSA  Result Value Ref Range   Prostate Specific Ag, Serum 1.0 0.0 - 4.0 ng/mL      Assessment & Plan:   Problem List Items Addressed This Visit      Other   Major depressive disorder, recurrent ([place])    Glad that the medicine change has helped his depressive symptoms; continue same; discussed risk of serotonin syndrome, so will change his pain medicine so that we can continue the antidepressants      Relevant Medications   DULoxetine (CYMBALTA) 60 MG capsule   Knee pain, bilateral    We discussed risk of serotonin  syndrome; will switch him from tramadol to hydrocodone PRN      Bilateral low back pain with bilateral sciatica    Offered referral to PT and/or ortho; open invitation      Relevant Medications   HYDROcodone-acetaminophen (NORCO/VICODIN) 5-325 MG tablet       Follow up plan: No Follow-up on file.  An after-visit summary was printed and given to the patient at [place].  Please see the patient instructions which may contain other information and recommendations beyond what is mentioned above in the assessment and plan.  Meds ordered this encounter  Medications  . HYDROcodone-acetaminophen (NORCO/VICODIN) 5-325 MG tablet    Sig: Take 1 tablet by mouth every 6 (six) hours as needed for moderate pain. (this replaces tramadol -- do not take both)    Dispense:  30 tablet    Refill:  0  . DULoxetine (CYMBALTA) 60 MG capsule    Sig: Take 1 capsule (60 mg total) by mouth daily. (this replaces venlafaxine)    Dispense:  30 capsule    Refill:  5    STOP the venlafaxine    No orders of the defined types were placed in this encounter.

## 2016-03-20 NOTE — Patient Instructions (Addendum)
STOP tramadol Use the hydrocodone if needed now for pain Let me know if you would like to see a physical therapist, open invitation in the chart Call me before January appt if needed

## 2016-03-26 DIAGNOSIS — M5441 Lumbago with sciatica, right side: Secondary | ICD-10-CM | POA: Insufficient documentation

## 2016-03-26 DIAGNOSIS — M5442 Lumbago with sciatica, left side: Secondary | ICD-10-CM

## 2016-03-26 NOTE — Assessment & Plan Note (Signed)
Glad that the medicine change has helped his depressive symptoms; continue same; discussed risk of serotonin syndrome, so will change his pain medicine so that we can continue the antidepressants

## 2016-03-26 NOTE — Assessment & Plan Note (Signed)
We discussed risk of serotonin syndrome; will switch him from tramadol to hydrocodone PRN

## 2016-03-26 NOTE — Assessment & Plan Note (Signed)
Offered referral to PT and/or ortho; open invitation

## 2016-03-27 DIAGNOSIS — R69 Illness, unspecified: Secondary | ICD-10-CM | POA: Diagnosis not present

## 2016-03-27 DIAGNOSIS — E119 Type 2 diabetes mellitus without complications: Secondary | ICD-10-CM | POA: Diagnosis not present

## 2016-03-27 DIAGNOSIS — Z0000 Encounter for general adult medical examination without abnormal findings: Secondary | ICD-10-CM | POA: Diagnosis not present

## 2016-03-27 DIAGNOSIS — E7800 Pure hypercholesterolemia, unspecified: Secondary | ICD-10-CM | POA: Diagnosis not present

## 2016-03-27 DIAGNOSIS — Z6839 Body mass index (BMI) 39.0-39.9, adult: Secondary | ICD-10-CM | POA: Diagnosis not present

## 2016-04-03 ENCOUNTER — Telehealth: Payer: Self-pay | Admitting: Family Medicine

## 2016-04-03 NOTE — Telephone Encounter (Signed)
Please see [facility] [place] note about needed/recommended screenings and order any that are due (such as colonoscopy, urine microalbumin:creatinine, etc) Thank you

## 2016-04-04 ENCOUNTER — Other Ambulatory Visit: Payer: Self-pay

## 2016-04-04 ENCOUNTER — Ambulatory Visit (INDEPENDENT_AMBULATORY_CARE_PROVIDER_SITE_OTHER): Payer: Medicare HMO

## 2016-04-04 DIAGNOSIS — E781 Pure hyperglyceridemia: Secondary | ICD-10-CM | POA: Diagnosis not present

## 2016-04-04 DIAGNOSIS — Z136 Encounter for screening for cardiovascular disorders: Secondary | ICD-10-CM

## 2016-04-04 DIAGNOSIS — Z1211 Encounter for screening for malignant neoplasm of colon: Secondary | ICD-10-CM

## 2016-04-04 DIAGNOSIS — E119 Type 2 diabetes mellitus without complications: Secondary | ICD-10-CM | POA: Diagnosis not present

## 2016-04-04 DIAGNOSIS — Z5181 Encounter for therapeutic drug level monitoring: Secondary | ICD-10-CM

## 2016-04-04 DIAGNOSIS — Z23 Encounter for immunization: Secondary | ICD-10-CM

## 2016-04-04 LAB — COMPLETE METABOLIC PANEL WITH GFR
ALBUMIN: 3.6 g/dL (ref 3.6–5.1)
ALK PHOS: 43 U/L (ref 40–115)
ALT: 31 U/L (ref 9–46)
AST: 31 U/L (ref 10–35)
BILIRUBIN TOTAL: 0.5 mg/dL (ref 0.2–1.2)
BUN: 13 mg/dL (ref 7–25)
CALCIUM: 8.6 mg/dL (ref 8.6–10.3)
CO2: 25 mmol/L (ref 20–31)
Chloride: 105 mmol/L (ref 98–110)
Creat: 0.94 mg/dL (ref 0.70–1.25)
GFR, EST NON AFRICAN AMERICAN: 84 mL/min (ref >=60)
GFR, Est African American: >89 mL/min (ref >=60)
GLUCOSE: 104 mg/dL — AB (ref 65–99)
POTASSIUM: 4.4 mmol/L (ref 3.5–5.3)
Sodium: 138 mmol/L (ref 135–146)
TOTAL PROTEIN: 5.8 g/dL — AB (ref 6.1–8.1)

## 2016-04-04 LAB — HEMOGLOBIN A1C
HEMOGLOBIN A1C: 6.3 % — AB (ref <5.7)
MEAN PLASMA GLUCOSE: 134 mg/dL

## 2016-04-04 LAB — LIPID PANEL
CHOL/HDL RATIO: 3.3 ratio (ref <5.0)
CHOLESTEROL: 137 mg/dL (ref <200)
HDL: 41 mg/dL (ref >40)
LDL Cholesterol: 71 mg/dL (ref <100)
Triglycerides: 125 mg/dL (ref <150)
VLDL: 25 mg/dL (ref <30)

## 2016-04-04 NOTE — Telephone Encounter (Signed)
Colonoscopy, AAA, labs ordered.  Patient coming in today to get pneumonia shot and labs.

## 2016-04-05 DIAGNOSIS — Z23 Encounter for immunization: Secondary | ICD-10-CM | POA: Diagnosis not present

## 2016-04-05 LAB — MICROALBUMIN / CREATININE URINE RATIO
CREATININE, URINE: 67 mg/dL (ref 20–370)
Microalb, Ur: <0.2 mg/dL

## 2016-04-10 ENCOUNTER — Other Ambulatory Visit: Payer: Self-pay | Admitting: Family Medicine

## 2016-04-10 DIAGNOSIS — G4733 Obstructive sleep apnea (adult) (pediatric): Secondary | ICD-10-CM | POA: Diagnosis not present

## 2016-04-10 DIAGNOSIS — Z87891 Personal history of nicotine dependence: Secondary | ICD-10-CM

## 2016-04-10 DIAGNOSIS — Z136 Encounter for screening for cardiovascular disorders: Secondary | ICD-10-CM

## 2016-04-17 ENCOUNTER — Ambulatory Visit: Payer: Medicare HMO

## 2016-04-18 ENCOUNTER — Ambulatory Visit
Admission: RE | Admit: 2016-04-18 | Discharge: 2016-04-18 | Disposition: A | Discharge status: Home / Self Care | Payer: Medicare HMO | Source: Ambulatory Visit | Attending: Family Medicine | Admitting: Family Medicine

## 2016-04-18 ENCOUNTER — Encounter: Payer: Self-pay | Admitting: Family Medicine

## 2016-04-18 DIAGNOSIS — I77819 Aortic ectasia, unspecified site: Secondary | ICD-10-CM | POA: Diagnosis not present

## 2016-04-18 DIAGNOSIS — Z136 Encounter for screening for cardiovascular disorders: Secondary | ICD-10-CM | POA: Diagnosis not present

## 2016-04-18 DIAGNOSIS — I77811 Abdominal aortic ectasia: Secondary | ICD-10-CM

## 2016-04-18 DIAGNOSIS — Z87891 Personal history of nicotine dependence: Secondary | ICD-10-CM | POA: Insufficient documentation

## 2016-04-18 HISTORY — DX: Abdominal aortic ectasia: I77.811

## 2016-04-20 ENCOUNTER — Other Ambulatory Visit: Payer: Self-pay | Admitting: Family Medicine

## 2016-04-21 ENCOUNTER — Telehealth: Payer: Self-pay | Admitting: Family Medicine

## 2016-04-21 ENCOUNTER — Encounter: Payer: Self-pay | Admitting: Family Medicine

## 2016-04-21 ENCOUNTER — Other Ambulatory Visit: Payer: Self-pay

## 2016-04-21 MED ORDER — ATORVASTATIN CALCIUM 10 MG PO TABS: 10.0000 mg | ORAL_TABLET | Freq: Every day | ORAL | 5 refills | Status: DC

## 2016-04-21 NOTE — Telephone Encounter (Signed)
Pt is needing atorvastatin refilled and the patient is leaving to go out of town and will be out .

## 2016-04-21 NOTE — Telephone Encounter (Signed)
Last sgpt and lipids reviewed;  Rx sent

## 2016-04-27 ENCOUNTER — Telehealth: Payer: Self-pay

## 2016-04-27 ENCOUNTER — Other Ambulatory Visit: Payer: Self-pay

## 2016-04-27 NOTE — Telephone Encounter (Signed)
Gastroenterology Pre-Procedure Review  Request Date:  Requesting Physician: Dr.   PATIENT REVIEW QUESTIONS: The patient responded to the following health history questions as indicated:    1. Are you having any GI issues? no 2. Do you have a personal history of Polyps? no 3. Do you have a family history of Colon Cancer or Polyps? no 4. Diabetes Mellitus? yes (Type 2) 5. Joint replacements in the past 12 months?no 6. Major health problems in the past 3 months?no 7. Any artificial heart valves, MVP, or defibrillator?no    MEDICATIONS & ALLERGIES:    Patient reports the following regarding taking any anticoagulation/antiplatelet therapy:   Plavix, Coumadin, Eliquis, Xarelto, Lovenox, Pradaxa, Brilinta, or Effient? no Aspirin? no  Patient confirms/reports the following medications:  Current Outpatient Prescriptions  Medication Sig Dispense Refill  . aspirin EC 81 MG tablet Take 1 tablet (81 mg total) by mouth daily. 30 tablet 11  . atorvastatin (LIPITOR) 10 MG tablet Take 1 tablet (10 mg total) by mouth at bedtime. 30 tablet 5  . buPROPion (WELLBUTRIN SR) 150 MG 12 hr tablet Take 1 tablet (150 mg total) by mouth 2 (two) times daily. 60 tablet 11  . DULoxetine (CYMBALTA) 60 MG capsule Take 1 capsule (60 mg total) by mouth daily. (this replaces venlafaxine) 30 capsule 5  . HYDROcodone-acetaminophen (NORCO/VICODIN) 5-325 MG tablet Take 1 tablet by mouth every 6 (six) hours as needed for moderate pain. (this replaces tramadol -- do not take both) 30 tablet 0  . lisinopril (PRINIVIL,ZESTRIL) 5 MG tablet Take 0.5 tablets (2.5 mg total) by mouth daily. 45 tablet 1  . magnesium gluconate (MAGONATE) 500 MG tablet Take 500 mg by mouth daily.    . metFORMIN (GLUCOPHAGE) 500 MG tablet TAKE 1 TABLET BY MOUTH 2 TIMES DAILY WITH A MEAL. 60 tablet 5  . Multiple Vitamins-Minerals (MULTIVITAMIN ADULT PO) Take 1 tablet by mouth daily.    . tamsulosin (FLOMAX) 0.4 MG CAPS capsule Take 1 capsule (0.4 mg  total) by mouth daily. 90 capsule 4  . vardenafil (LEVITRA) 20 MG tablet Take 1 tablet 2 hours prior to intercourse on an empty stomach. 18 tablet 3   No current facility-administered medications for this visit.     Patient confirms/reports the following allergies:  No Known Allergies  No orders of the defined types were placed in this encounter.   AUTHORIZATION INFORMATION Primary Insurance: 1D#: Group #:  Secondary Insurance: 1D#: Group #:  SCHEDULE INFORMATION: Date:  05/09/16 Time: Location: [facility]

## 2016-05-05 ENCOUNTER — Other Ambulatory Visit: Payer: Self-pay

## 2016-05-05 ENCOUNTER — Telehealth: Payer: Self-pay | Admitting: Gastroenterology

## 2016-05-05 NOTE — Telephone Encounter (Signed)
Patient has a question regarding medication. Colonoscopy is Tuesday.

## 2016-05-05 NOTE — Telephone Encounter (Signed)
Reviewed medications with pt and advised what he can and cannot take the morning of procedure. All questions answered.

## 2016-05-09 ENCOUNTER — Encounter: Payer: Self-pay | Admitting: *Deleted

## 2016-05-09 ENCOUNTER — Ambulatory Visit: Payer: Medicare HMO | Admitting: Registered Nurse

## 2016-05-09 ENCOUNTER — Encounter
Admission: RE | Disposition: A | Discharge status: Home / Self Care | Payer: Self-pay | Source: Ambulatory Visit | Attending: Gastroenterology

## 2016-05-09 ENCOUNTER — Ambulatory Visit
Admission: RE | Admit: 2016-05-09 | Discharge: 2016-05-09 | Disposition: A | Discharge status: Home / Self Care | Payer: Medicare HMO | Source: Ambulatory Visit | Attending: Gastroenterology | Admitting: Gastroenterology

## 2016-05-09 DIAGNOSIS — G4730 Sleep apnea, unspecified: Secondary | ICD-10-CM | POA: Insufficient documentation

## 2016-05-09 DIAGNOSIS — M1990 Unspecified osteoarthritis, unspecified site: Secondary | ICD-10-CM | POA: Diagnosis not present

## 2016-05-09 DIAGNOSIS — E669 Obesity, unspecified: Secondary | ICD-10-CM | POA: Diagnosis not present

## 2016-05-09 DIAGNOSIS — E119 Type 2 diabetes mellitus without complications: Secondary | ICD-10-CM | POA: Diagnosis not present

## 2016-05-09 DIAGNOSIS — I10 Essential (primary) hypertension: Secondary | ICD-10-CM | POA: Insufficient documentation

## 2016-05-09 DIAGNOSIS — K5730 Diverticulosis of large intestine without perforation or abscess without bleeding: Secondary | ICD-10-CM | POA: Insufficient documentation

## 2016-05-09 DIAGNOSIS — I739 Peripheral vascular disease, unspecified: Secondary | ICD-10-CM | POA: Insufficient documentation

## 2016-05-09 DIAGNOSIS — R69 Illness, unspecified: Secondary | ICD-10-CM | POA: Diagnosis not present

## 2016-05-09 DIAGNOSIS — Z7984 Long term (current) use of oral hypoglycemic drugs: Secondary | ICD-10-CM | POA: Diagnosis not present

## 2016-05-09 DIAGNOSIS — Z85828 Personal history of other malignant neoplasm of skin: Secondary | ICD-10-CM | POA: Diagnosis not present

## 2016-05-09 DIAGNOSIS — D122 Benign neoplasm of ascending colon: Secondary | ICD-10-CM

## 2016-05-09 DIAGNOSIS — Z79899 Other long term (current) drug therapy: Secondary | ICD-10-CM | POA: Insufficient documentation

## 2016-05-09 DIAGNOSIS — K641 Second degree hemorrhoids: Secondary | ICD-10-CM | POA: Diagnosis not present

## 2016-05-09 DIAGNOSIS — K635 Polyp of colon: Secondary | ICD-10-CM | POA: Diagnosis not present

## 2016-05-09 DIAGNOSIS — Z1211 Encounter for screening for malignant neoplasm of colon: Secondary | ICD-10-CM | POA: Insufficient documentation

## 2016-05-09 DIAGNOSIS — F329 Major depressive disorder, single episode, unspecified: Secondary | ICD-10-CM | POA: Diagnosis not present

## 2016-05-09 DIAGNOSIS — Z7982 Long term (current) use of aspirin: Secondary | ICD-10-CM | POA: Diagnosis not present

## 2016-05-09 HISTORY — PX: COLONOSCOPY WITH PROPOFOL: SHX5780

## 2016-05-09 HISTORY — DX: Polyp of colon: K63.5

## 2016-05-09 LAB — GLUCOSE, CAPILLARY: Glucose-Capillary: 97 mg/dL (ref 65–99)

## 2016-05-09 SURGERY — COLONOSCOPY WITH PROPOFOL
Anesthesia: General

## 2016-05-09 MED ORDER — SODIUM CHLORIDE 0.9 % IV SOLN
INTRAVENOUS | Status: DC
  Administered 2016-05-09: 09:00:00 via INTRAVENOUS

## 2016-05-09 MED ORDER — PROPOFOL 10 MG/ML IV BOLUS
INTRAVENOUS | Status: DC | PRN
  Administered 2016-05-09: 20 mg via INTRAVENOUS
  Administered 2016-05-09: 80 mg via INTRAVENOUS

## 2016-05-09 MED ORDER — PROPOFOL 500 MG/50ML IV EMUL
INTRAVENOUS | Status: DC | PRN
  Administered 2016-05-09: 150 ug/kg/min via INTRAVENOUS

## 2016-05-09 MED ORDER — PROPOFOL 500 MG/50ML IV EMUL
INTRAVENOUS | Status: AC
Start: 2016-05-09 — End: 2016-05-09
  Filled 2016-05-09: qty 50

## 2016-05-09 NOTE — Anesthesia Procedure Notes (Signed)
Date/Time: 05/09/2016 9:20 AM Performed by: Hedda Slade Pre-anesthesia Checklist: Patient identified, Emergency Drugs available, Suction available and Patient being monitored Oxygen Delivery Method: Nasal cannula

## 2016-05-09 NOTE — H&P (Signed)
Lucilla Lame, MD [facility] [address], [place], [place] [address] Phone: [phone] Fax : [phone]  Primary Care Physician:  Enid Derry, MD Primary Gastroenterologist:  Dr. Allen Norris  Pre-Procedure History & Physical: HPI:  Duane Hughes is a 66 y.o. male is here for a screening colonoscopy.   Past Medical History:  Diagnosis Date  . Arthritis    back, hands, knees  . Depression   . Diabetes mellitus without complication ([place])   . Ectatic abdominal aorta ([place]) 04/18/2016   2.9 cm on [place] 2016, unchanged 2017  . History of skin cancer    hand  . Obesity 11/11/2014  . Sleep apnea     Past Surgical History:  Procedure Laterality Date  . HERNIA REPAIR    . KNEE ARTHROSCOPY     several scopes done  . MEDIAL PARTIAL KNEE REPLACEMENT Left    2000s?  Marland Kitchen SKIN CANCER EXCISION  Feb 2017   removed from right hand    Prior to Admission medications   Medication Sig Start Date End Date Taking? Authorizing Provider  atorvastatin (LIPITOR) 10 MG tablet Take 1 tablet (10 mg total) by mouth at bedtime. 04/21/16  Yes Arnetha Courser, MD  buPROPion (WELLBUTRIN SR) 150 MG 12 hr tablet Take 1 tablet (150 mg total) by mouth 2 (two) times daily. 06/25/15  Yes Arnetha Courser, MD  DULoxetine (CYMBALTA) 60 MG capsule Take 1 capsule (60 mg total) by mouth daily. (this replaces venlafaxine) 03/20/16  Yes Arnetha Courser, MD  HYDROcodone-acetaminophen (NORCO/VICODIN) 5-325 MG tablet Take 1 tablet by mouth every 6 (six) hours as needed for moderate pain. (this replaces tramadol -- do not take both) 03/20/16  Yes Arnetha Courser, MD  lisinopril (PRINIVIL,ZESTRIL) 5 MG tablet Take 0.5 tablets (2.5 mg total) by mouth daily. 01/26/16  Yes Arnetha Courser, MD  metFORMIN (GLUCOPHAGE) 500 MG tablet TAKE 1 TABLET BY MOUTH 2 TIMES DAILY WITH A MEAL. 03/13/16  Yes Arnetha Courser, MD  vardenafil (LEVITRA) 20 MG tablet Take 1 tablet 2 hours prior to intercourse on an empty stomach. 08/05/15  Yes Larene Beach A McGowan,  PA-C  venlafaxine XR (EFFEXOR-XR) 150 MG 24 hr capsule  02/02/16  Yes Historical Provider, MD  aspirin EC 81 MG tablet Take 1 tablet (81 mg total) by mouth daily. 04/15/15   Arnetha Courser, MD  magnesium gluconate (MAGONATE) 500 MG tablet Take 500 mg by mouth daily.    Historical Provider, MD  Multiple Vitamins-Minerals (MULTIVITAMIN ADULT PO) Take 1 tablet by mouth daily.    Historical Provider, MD  tamsulosin (FLOMAX) 0.4 MG CAPS capsule Take 1 capsule (0.4 mg total) by mouth daily. 03/01/16   Nori Riis, PA-C  traMADol (ULTRAM) 50 MG tablet  02/25/16   Historical Provider, MD    Allergies as of 04/27/2016  . (No Known Allergies)    Family History  Problem Relation Age of Onset  . Alcohol abuse Mother   . Rheum arthritis Mother   . Stroke Maternal Grandmother   . Stroke Paternal Grandfather   . Cancer Neg Hx   . Diabetes Neg Hx   . Heart disease Neg Hx   . Hypertension Neg Hx   . Kidney disease Neg Hx   . Prostate cancer Neg Hx     Social History   Social History  . Marital status: Married    Spouse name: N/A  . Number of children: N/A  . Years of education: N/A   Occupational  History  . Not on file.   Social History Main Topics  . Smoking status: Never Smoker  . Smokeless tobacco: Never Used  . Alcohol use No  . Drug use: No  . Sexual activity: Yes    Partners: Female   Other Topics Concern  . Not on file   Social History Narrative  . No narrative on file    Review of Systems: See HPI, otherwise negative ROS  Physical Exam: BP 120/81   Pulse 84   Temp 99.3 F (37.4 C) (Tympanic)   Resp 18   Ht 5\' 8"  (1.727 m)   Wt 262 lb (118.8 kg)   SpO2 94%   BMI 39.84 kg/m  General:   Alert,  pleasant and cooperative in NAD Head:  Normocephalic and atraumatic. Neck:  Supple; no masses or thyromegaly. Lungs:  Clear throughout to auscultation.    Heart:  Regular rate and rhythm. Abdomen:  Soft, nontender and nondistended. Normal bowel sounds, without  guarding, and without rebound.   Neurologic:  Alert and  oriented x4;  grossly normal neurologically.  Impression/Plan: Duane Hughes is now here to undergo a screening colonoscopy.  Risks, benefits, and alternatives regarding colonoscopy have been reviewed with the patient.  Questions have been answered.  All parties agreeable.

## 2016-05-09 NOTE — Anesthesia Postprocedure Evaluation (Signed)
Anesthesia Post Note  Patient: Duane Hughes  Procedure(s) Performed: Procedure(s) (LRB): COLONOSCOPY WITH PROPOFOL (N/A)  Patient location during evaluation: PACU Anesthesia Type: General Level of consciousness: awake and alert Pain management: pain level controlled Vital Signs Assessment: post-procedure vital signs reviewed and stable Respiratory status: spontaneous breathing, nonlabored ventilation, respiratory function stable and patient connected to nasal cannula oxygen Cardiovascular status: blood pressure returned to baseline and stable Postop Assessment: no signs of nausea or vomiting Anesthetic complications: no     Last Vitals:  Vitals:   05/09/16 0949 05/09/16 0950  BP: 113/64 113/64  Pulse: 75 73  Resp: 18 17  Temp: 36.3 C 36.3 C    Last Pain:  Vitals:   05/09/16 0949  TempSrc: Tympanic                 Molli Barrows

## 2016-05-09 NOTE — Addendum Note (Signed)
Addendum  created 05/09/16 1025 by Hedda Slade, CRNA   Anesthesia Intra Blocks edited, Child order released for a procedure order, Sign clinical note

## 2016-05-09 NOTE — Op Note (Signed)
[facility] Gastroenterology Patient Name: Duane Hughes Procedure Date: 05/09/2016 9:24 AM MRN: [id number] Account #: [id number] Date of Birth: [birth year] Admit Type: Outpatient Age: 66 Room: [facility] ENDO ROOM 4 Gender: Male Note Status: Finalized Procedure:            Colonoscopy Indications:          Screening for colorectal malignant neoplasm Providers:            Lucilla Lame MD, MD Referring MD:         Arnetha Courser (Referring MD) Medicines:            Propofol per Anesthesia Complications:        No immediate complications. Procedure:            Pre-Anesthesia Assessment:                       - Prior to the procedure, a History and Physical was                        performed, and patient medications and allergies were                        reviewed. The patient's tolerance of previous                        anesthesia was also reviewed. The risks and benefits of                        the procedure and the sedation options and risks were                        discussed with the patient. All questions were                        answered, and informed consent was obtained. Prior                        Anticoagulants: The patient has taken no previous                        anticoagulant or antiplatelet agents. ASA Grade                        Assessment: II - A patient with mild systemic disease.                        After reviewing the risks and benefits, the patient was                        deemed in satisfactory condition to undergo the                        procedure.                       After obtaining informed consent, the colonoscope was                        passed under direct vision. Throughout the procedure,  the patient's blood pressure, pulse, and oxygen                        saturations were monitored continuously. The                        Colonoscope was introduced through the anus and    advanced to the the cecum, identified by appendiceal                        orifice and ileocecal valve. The colonoscopy was                        performed without difficulty. The patient tolerated the                        procedure well. The quality of the bowel preparation                        was excellent. Findings:      The perianal and digital rectal examinations were normal.      Two sessile polyps were found in the ascending colon. The polyps were 4       to 5 mm in size. These polyps were removed with a cold snare. Resection       and retrieval were complete.      Multiple small-mouthed diverticula were found in the sigmoid colon.      Non-bleeding internal hemorrhoids were found during retroflexion. The       hemorrhoids were Grade II (internal hemorrhoids that prolapse but reduce       spontaneously). Impression:           - Two 4 to 5 mm polyps in the ascending colon, removed                        with a cold snare. Resected and retrieved.                       - Diverticulosis in the sigmoid colon.                       - Non-bleeding internal hemorrhoids. Recommendation:       - Discharge patient to home.                       - Resume previous diet.                       - Continue present medications.                       - Await pathology results.                       - Repeat colonoscopy in 5 years if polyp adenoma and 10                        years if hyperplastic Procedure Code(s):    --- Professional ---                       [phone], Colonoscopy, flexible; with removal of tumor(s),  polyp(s), or other lesion(s) by snare technique Diagnosis Code(s):    --- Professional ---                       D12.2, Benign neoplasm of ascending colon                       Z12.11, Encounter for screening for malignant neoplasm                        of colon CPT copyright 2016 American Medical Association. All rights reserved. The codes documented  in this report are preliminary and upon coder review may  be revised to meet current compliance requirements. Lucilla Lame MD, MD 05/09/2016 9:45:21 AM This report has been signed electronically. Number of Addenda: 0 Note Initiated On: 05/09/2016 9:24 AM Scope Withdrawal Time: 0 hours 13 minutes 56 seconds  Total Procedure Duration: 0 hours 17 minutes 26 seconds       [facility]

## 2016-05-09 NOTE — Anesthesia Preprocedure Evaluation (Signed)
Anesthesia Evaluation  Patient identified by MRN, date of birth, ID band Patient awake    Reviewed: Allergy & Precautions, H&P , NPO status , Patient's Chart, lab work & pertinent test results, reviewed documented beta blocker date and time   Airway Mallampati: III   Neck ROM: full    Dental  (+) Teeth Intact   Pulmonary neg pulmonary ROS, sleep apnea ,    Pulmonary exam normal        Cardiovascular hypertension, + Peripheral Vascular Disease  negative cardio ROS Normal cardiovascular exam Rhythm:regular Rate:Normal     Neuro/Psych PSYCHIATRIC DISORDERS negative neurological ROS  negative psych ROS   GI/Hepatic negative GI ROS, Neg liver ROS,   Endo/Other  negative endocrine ROSdiabetes  Renal/GU negative Renal ROS  negative genitourinary   Musculoskeletal   Abdominal   Peds  Hematology negative hematology ROS (+)   Anesthesia Other Findings Past Medical History: No date: Arthritis     Comment: back, hands, knees No date: Depression No date: Diabetes mellitus without complication ([place]) [id number]: Ectatic abdominal aorta (HCC)     Comment: 2.9 cm on [place] 2016, unchanged 2017 No date: History of skin cancer     Comment: hand 11/11/2014: Obesity No date: Sleep apnea Past Surgical History: No date: HERNIA REPAIR No date: KNEE ARTHROSCOPY     Comment: several scopes done No date: MEDIAL PARTIAL KNEE REPLACEMENT Left     Comment: 2000s? Feb 2017: SKIN CANCER EXCISION     Comment: removed from right hand BMI    Body Mass Index:  39.84 kg/m     Reproductive/Obstetrics negative OB ROS                             Anesthesia Physical Anesthesia Plan  ASA: III  Anesthesia Plan: General   Post-op Pain Management:    Induction:   Airway Management Planned:   Additional Equipment:   Intra-op Plan:   Post-operative Plan:   Informed Consent: I have reviewed the patients  History and Physical, chart, labs and discussed the procedure including the risks, benefits and alternatives for the proposed anesthesia with the patient or authorized representative who has indicated his/her understanding and acceptance.   Dental Advisory Given  Plan Discussed with: CRNA  Anesthesia Plan Comments:         Anesthesia Quick Evaluation

## 2016-05-09 NOTE — Transfer of Care (Signed)
Immediate Anesthesia Transfer of Care Note  Patient: Duane Hughes  Procedure(s) Performed: Procedure(s): COLONOSCOPY WITH PROPOFOL (N/A)  Patient Location: PACU  Anesthesia Type:General  Level of Consciousness: awake, alert  and oriented  Airway & Oxygen Therapy: Patient Spontanous Breathing and Patient connected to nasal cannula oxygen  Post-op Assessment: Report given to RN  Post vital signs: Reviewed and stable  Last Vitals:  Vitals:   05/09/16 0846 05/09/16 0949  BP: 120/81 113/64  Pulse: 84 75  Resp: 18 18  Temp: 37.4 C 36.3 C    Last Pain:  Vitals:   05/09/16 0949  TempSrc: Tympanic         Complications: No apparent anesthesia complications

## 2016-05-10 ENCOUNTER — Encounter: Payer: Self-pay | Admitting: Gastroenterology

## 2016-05-10 LAB — SURGICAL PATHOLOGY

## 2016-05-11 ENCOUNTER — Encounter: Payer: Self-pay | Admitting: Gastroenterology

## 2016-05-11 DIAGNOSIS — G4733 Obstructive sleep apnea (adult) (pediatric): Secondary | ICD-10-CM | POA: Diagnosis not present

## 2016-05-16 ENCOUNTER — Other Ambulatory Visit: Payer: Self-pay | Admitting: Family Medicine

## 2016-05-17 NOTE — Telephone Encounter (Signed)
Our office is closed today because of inclement weather; this Rx will need to be printed and signed and picked up by patient Weather permitting, I'll be in the office tomorrow [place] web site reviewed over last 12+ months; no other prescribers; no red flags Will print Rx today and sign when office open again Patient may pick up during office hours

## 2016-05-22 ENCOUNTER — Encounter: Payer: Self-pay | Admitting: Family Medicine

## 2016-05-22 ENCOUNTER — Ambulatory Visit (INDEPENDENT_AMBULATORY_CARE_PROVIDER_SITE_OTHER): Payer: Medicare HMO | Admitting: Family Medicine

## 2016-05-22 DIAGNOSIS — I77811 Abdominal aortic ectasia: Secondary | ICD-10-CM | POA: Diagnosis not present

## 2016-05-22 DIAGNOSIS — R2231 Localized swelling, mass and lump, right upper limb: Secondary | ICD-10-CM | POA: Diagnosis not present

## 2016-05-22 DIAGNOSIS — E119 Type 2 diabetes mellitus without complications: Secondary | ICD-10-CM | POA: Diagnosis not present

## 2016-05-22 DIAGNOSIS — M25562 Pain in left knee: Secondary | ICD-10-CM

## 2016-05-22 DIAGNOSIS — E6609 Other obesity due to excess calories: Secondary | ICD-10-CM

## 2016-05-22 DIAGNOSIS — Z6839 Body mass index (BMI) 39.0-39.9, adult: Secondary | ICD-10-CM | POA: Diagnosis not present

## 2016-05-22 DIAGNOSIS — G8929 Other chronic pain: Secondary | ICD-10-CM

## 2016-05-22 DIAGNOSIS — I10 Essential (primary) hypertension: Secondary | ICD-10-CM

## 2016-05-22 DIAGNOSIS — E785 Hyperlipidemia, unspecified: Secondary | ICD-10-CM | POA: Diagnosis not present

## 2016-05-22 DIAGNOSIS — IMO0001 Reserved for inherently not codable concepts without codable children: Secondary | ICD-10-CM

## 2016-05-22 DIAGNOSIS — M25561 Pain in right knee: Secondary | ICD-10-CM | POA: Diagnosis not present

## 2016-05-22 NOTE — Progress Notes (Signed)
BP 112/62   Pulse 99   Temp 98.1 F (36.7 C)   Resp 16   Wt 262 lb 3 oz (118.9 kg)   SpO2 94%   BMI 39.87 kg/m    Subjective:    Patient ID: Duane Hughes, male    DOB: 1950/11/01, 66 y.o.   MRN: [id number]  HPI: Duane Hughes is a 66 y.o. male  Chief Complaint  Patient presents with  . Diabetes  . Edema    Right arm pit. Itchy and irrating. Notice it a week and a half ago    Patient is here for f/u, but has a new issue under the right armpit Swelling under the right axillary region; it's visible when he just raises his right arm; thinks it's been there maybe a month No night sweats or unexplained weight loss No lumps in the breast  Type 2 diabetes mellitus No problems with feet No dry mouth  Hypertension; under 130s; not adding salt to his food  Hypercholesterol; on atoravastatin; does not eat bologna or hot dogs; still eats sausage and bacon, less than 1-2 years ago; has had some aching  Taking turmeric for inflammation; pain is less if working or out in the yard; uses hydrocodone for PRN pain; not goofy or loopy or drunk; glad we went up; when he needs it, it really takes the edge off the knee pain; no alcohol  No chest pain, no SHOB  Colonoscopy UTD, next in five years  Had left knee surgery; has pain in the left hip when walking; had worn knee down 1/8"; will just keep walking on it  Depression screen [facility] 2/9 05/22/2016 03/20/2016 02/24/2016 08/24/2015 05/17/2015  Decreased Interest 0 0 1 0 0  Down, Depressed, Hopeless 0 1 0 0 0  PHQ - 2 Score 0 1 1 0 0   Relevant past medical, surgical, family and social history reviewed Past Medical History:  Diagnosis Date  . Arthritis    back, hands, knees  . Depression   . Diabetes mellitus without complication ([place])   . Ectatic abdominal aorta ([place]) 04/18/2016   2.9 cm on [place] 2016, unchanged 2017  . History of skin cancer    hand  . Obesity 11/11/2014  . Sleep apnea    Past Surgical History:  Procedure  Laterality Date  . COLONOSCOPY WITH PROPOFOL N/A 05/09/2016   Procedure: COLONOSCOPY WITH PROPOFOL;  Surgeon: Lucilla Lame, MD;  Location: [facility] ENDOSCOPY;  Service: Endoscopy;  Laterality: N/A;  . HERNIA REPAIR    . KNEE ARTHROSCOPY     several scopes done  . MEDIAL PARTIAL KNEE REPLACEMENT Left    2000s?  Marland Kitchen SKIN CANCER EXCISION  Feb 2017   removed from right hand   Family History  Problem Relation Age of Onset  . Alcohol abuse Mother   . Rheum arthritis Mother   . Stroke Maternal Grandmother   . Stroke Paternal Grandfather   . Cancer Neg Hx   . Diabetes Neg Hx   . Heart disease Neg Hx   . Hypertension Neg Hx   . Kidney disease Neg Hx   . Prostate cancer Neg Hx    Social History  Substance Use Topics  . Smoking status: Never Smoker  . Smokeless tobacco: Never Used  . Alcohol use No   Interim medical history since last visit reviewed. Allergies and medications reviewed  Review of Systems Per HPI unless specifically indicated above     Objective:    BP  112/62   Pulse 99   Temp 98.1 F (36.7 C)   Resp 16   Wt 262 lb 3 oz (118.9 kg)   SpO2 94%   BMI 39.87 kg/m   Wt Readings from Last 3 Encounters:  05/22/16 262 lb 3 oz (118.9 kg)  05/09/16 262 lb (118.8 kg)  03/20/16 262 lb (118.8 kg)    Physical Exam  Constitutional: He appears well-developed and well-nourished. No distress.  Obese, weight stable  HENT:  Head: Normocephalic and atraumatic.  Eyes: EOM are normal. No scleral icterus.  Neck: No thyromegaly present.  Cardiovascular: Normal rate and regular rhythm.   Pulmonary/Chest: Effort normal and breath sounds normal. Right breast exhibits no inverted nipple, no mass, no nipple discharge, no skin change and no tenderness. Breasts are symmetrical.  Abdominal: Soft. Bowel sounds are normal. He exhibits no distension.  Musculoskeletal: He exhibits no edema.  Under the RIGHT axilla, there is a visible and palpable mass, about the size of a racquet ball; no  overlying skin changes; no erythema; no peau d'orange changes  Neurological: He displays no tremor.  Skin: Skin is warm and dry. No pallor.  Psychiatric: He has a normal mood and affect. Judgment normal. His mood appears not anxious. Cognition and memory are normal. He does not exhibit a depressed mood. He expresses no homicidal and no suicidal ideation.   Diabetic Foot Form - Detailed   Diabetic Foot Exam - detailed Diabetic Foot exam was performed with the following findings:  Yes 05/22/2016  3:37 PM  Visual Foot Exam completed.:  Yes  Are the toenails ingrown?:  No Normal Range of Motion:  Yes Pulse Foot Exam completed.:  Yes  Right Dorsalis Pedis:  Present Left Dorsalis Pedis:  Present  Sensory Foot Exam Completed.:  Yes Swelling:  No Semmes-Weinstein Monofilament Test R Site 1-Great Toe:  Pos L Site 1-Great Toe:  Pos  R Site 4:  Pos L Site 4:  Pos  R Site 5:  Pos L Site 5:  Pos        Results for orders placed or performed during the hospital encounter of 05/09/16  Glucose, capillary  Result Value Ref Range   Glucose-Capillary 97 65 - 99 mg/dL  Surgical pathology  Result Value Ref Range   SURGICAL PATHOLOGY      Surgical Pathology CASE: [id number] PATIENT: Azazel Requena Surgical Pathology Report     SPECIMEN SUBMITTED: A. Colon polyp x2, ascending; cold snare  CLINICAL HISTORY: None provided  PRE-OPERATIVE DIAGNOSIS: Screening Z12.11  POST-OPERATIVE DIAGNOSIS: Colon polyps     DIAGNOSIS: A. COLON POLYP 2, ASCENDING; COLD SNARE: - TUBULAR ADENOMAS (2). - NEGATIVE FOR HIGH-GRADE DYSPLASIA AND MALIGNANCY.   GROSS DESCRIPTION:  A. Labeled: ascending colon polyp cold snare 2  Tissue fragment(s): multiple  Size: aggregate, 2.0 x 2.0 x 0.1 cm  Description: pink fragments and fecal material  Entirely submitted in 1 cassette(s).    Final Diagnosis performed by Delorse Lek, MD.  Electronically signed 05/10/2016 11:29:06AM    The electronic  signature indicates that the named Attending Pathologist has evaluated the specimen  Technical component performed at [facility], [address] [place], [place] [address] Lab: [phone] Dir: Darrick Penna. Evette Doffing, MD   Professional component performed at [facility], [facility], [place], [place], [place] [address] Lab: [phone] Dir: Dellia Nims. Reuel Derby, MD        Assessment & Plan:   Problem List Items Addressed This Visit      Cardiovascular and  Mediastinum   Ectatic abdominal aorta (HCC)    Unchanged between 2016 and 2017; keep pressure low, control sugars and lipids      Benign hypertension    Excellent control today        Endocrine   Diabetes mellitus without complication ([place])    Next A1c due in early June; foot exam by MD; encouraged yearly eye exams; daily aspirin, BP control, lipid management        Other   Obesity    I really encouraged patient to work on weight loss      Mass of right axilla    Order [place] to evaluate; may be lipoma; a little low for lymphadenopathy I believe, but in ddx      Relevant Orders   [place] Misc Soft Tissue   Knee pain, bilateral    Using pain medicine PRN; no red flags; [place] web site reviewed; chronic issue      Dyslipidemia    Continue statin; goal LDL less than 70 ideally; weight loss was recommended today          Follow up plan: Return in about 4 months (around 10/04/2016) for fasting labs and visit with Dr. Sanda Klein.  An after-visit summary was printed and given to the patient at [place].  Please see the patient instructions which may contain other information and recommendations beyond what is mentioned above in the assessment and plan.  No orders of the defined types were placed in this encounter.   Orders Placed This Encounter  Procedures  . [place] Misc Soft Tissue

## 2016-05-22 NOTE — Assessment & Plan Note (Addendum)
Order [place] to evaluate; may be lipoma; a little low for lymphadenopathy I believe, but in ddx

## 2016-05-22 NOTE — Patient Instructions (Addendum)
Please do see your eye doctor regularly, and have your eyes examined every year (or more often per his or her recommendation) Check your feet every night and let me know right away of any sores, infections, numbness, etc. Try to limit sweets, white bread, white rice, white potatoes It is okay with me for you to not check your fingerstick blood sugars (per [employer] of Endocrinology Best Practices), unless you are interested and feel it would be helpful for you Let me know if I can make a referral for you to the physical therapist about your left hip

## 2016-05-24 NOTE — Assessment & Plan Note (Signed)
Continue statin; goal LDL less than 70 ideally; weight loss was recommended today

## 2016-05-24 NOTE — Assessment & Plan Note (Signed)
Unchanged between 2016 and 2017; keep pressure low, control sugars and lipids

## 2016-05-24 NOTE — Assessment & Plan Note (Signed)
Using pain medicine PRN; no red flags; [place] web site reviewed; chronic issue

## 2016-05-24 NOTE — Assessment & Plan Note (Signed)
I really encouraged patient to work on weight loss

## 2016-05-24 NOTE — Assessment & Plan Note (Signed)
Next A1c due in early June; foot exam by MD; encouraged yearly eye exams; daily aspirin, BP control, lipid management

## 2016-05-24 NOTE — Assessment & Plan Note (Signed)
Excellent control today 

## 2016-05-25 ENCOUNTER — Encounter: Payer: Self-pay | Admitting: Family Medicine

## 2016-05-25 ENCOUNTER — Other Ambulatory Visit: Payer: Self-pay

## 2016-05-25 DIAGNOSIS — R2231 Localized swelling, mass and lump, right upper limb: Secondary | ICD-10-CM

## 2016-05-25 NOTE — Addendum Note (Signed)
Addended by: Johnnette Litter A on: 05/25/2016 03:41 PM   Modules accepted: Orders

## 2016-05-31 ENCOUNTER — Ambulatory Visit: Payer: Medicare HMO

## 2016-06-02 ENCOUNTER — Other Ambulatory Visit: Payer: Self-pay | Admitting: Family Medicine

## 2016-06-05 ENCOUNTER — Ambulatory Visit
Admission: RE | Admit: 2016-06-05 | Discharge: 2016-06-05 | Disposition: A | Discharge status: Home / Self Care | Payer: Medicare HMO | Source: Ambulatory Visit | Attending: Family Medicine | Admitting: Family Medicine

## 2016-06-05 DIAGNOSIS — R2231 Localized swelling, mass and lump, right upper limb: Secondary | ICD-10-CM

## 2016-06-06 ENCOUNTER — Telehealth: Payer: Self-pay | Admitting: Family Medicine

## 2016-06-06 DIAGNOSIS — R590 Localized enlarged lymph nodes: Secondary | ICD-10-CM | POA: Insufficient documentation

## 2016-06-06 DIAGNOSIS — R2231 Localized swelling, mass and lump, right upper limb: Secondary | ICD-10-CM

## 2016-06-06 NOTE — Assessment & Plan Note (Signed)
Refer to gen surg

## 2016-06-06 NOTE — Telephone Encounter (Signed)
I talked with patient about scan results; appear to be two benign lymph nodes Refer to gen surg for evaluation of lymph node, consider biopsy I asked pt to call if he has not heard about referral in one week

## 2016-06-06 NOTE — Assessment & Plan Note (Signed)
Patient is asymptomatic; no cough, nonsmoker, no night sweats, no recent sores, nothing to explain lymph node enlargement; breast exam was negative in office; radiologist report suggests these are benign; will refer to surgeon for consideration of biopsy rather than chest CT to start with (can progress to mammogram or chest CT if needed), but patient agrees with seeing surgeon first step

## 2016-06-07 ENCOUNTER — Encounter: Payer: Self-pay | Admitting: General Surgery

## 2016-06-11 DIAGNOSIS — G4733 Obstructive sleep apnea (adult) (pediatric): Secondary | ICD-10-CM | POA: Diagnosis not present

## 2016-06-19 ENCOUNTER — Other Ambulatory Visit: Payer: Self-pay | Admitting: Family Medicine

## 2016-06-19 NOTE — Telephone Encounter (Signed)
Rx request received for duloxetine; I prescribed a 6 month supply on 03/20/16; please clarify with pharmacy, make sure patient taking correctly; he should not be out

## 2016-06-20 ENCOUNTER — Ambulatory Visit (INDEPENDENT_AMBULATORY_CARE_PROVIDER_SITE_OTHER): Payer: Medicare HMO | Admitting: General Surgery

## 2016-06-20 ENCOUNTER — Ambulatory Visit
Admission: RE | Admit: 2016-06-20 | Discharge: 2016-06-20 | Disposition: A | Discharge status: Home / Self Care | Payer: Medicare HMO | Source: Ambulatory Visit | Attending: General Surgery | Admitting: General Surgery

## 2016-06-20 ENCOUNTER — Encounter: Payer: Self-pay | Admitting: General Surgery

## 2016-06-20 VITALS — BP 138/72 | HR 86 | Resp 14 | Ht 69.0 in | Wt 265.0 lb

## 2016-06-20 DIAGNOSIS — R591 Generalized enlarged lymph nodes: Secondary | ICD-10-CM | POA: Diagnosis not present

## 2016-06-20 DIAGNOSIS — R590 Localized enlarged lymph nodes: Secondary | ICD-10-CM | POA: Diagnosis not present

## 2016-06-20 DIAGNOSIS — R918 Other nonspecific abnormal finding of lung field: Secondary | ICD-10-CM | POA: Diagnosis not present

## 2016-06-20 NOTE — Patient Instructions (Signed)
The patient is aware to call back for any questions or concerns.  

## 2016-06-20 NOTE — Telephone Encounter (Signed)
Called pt pharmacy. Went over the pt refills, dosage and QTY of the medication with the pharm tech. Pt is aware that he has refills. Pharm tech stated he has been picking the refills on time not to early. Called patient as well and he confirmed he is taking the medications how it was prescribed.

## 2016-06-20 NOTE — Progress Notes (Signed)
Patient ID: Duane Hughes, male   DOB: 1951-04-25, 66 y.o.   MRN: [id number]  Chief Complaint  Patient presents with  . Mass    HPI Duane Hughes is a 66 y.o. male here today for a evaluation of two right axillary masses. Patient states he noticed this area about 1-2 months. He states it started out with itching but no pain. No change is size that he can tell. Denies any insect bites. He does have a cat that scratches his leg occasionally. He is a Firefighter. Denies weight loss or night sweats. He had an ultrasound on 06-05-16. He had an abdominal CT scan in 2008.  HPI  Past Medical History:  Diagnosis Date  . Arthritis    back, hands, knees  . Colon polyp 05/09/2016   TUBULAR ADENOMAS   . Depression   . Diabetes mellitus without complication ([place])   . Ectatic abdominal aorta ([place]) 04/18/2016   2.9 cm on [place] 2016, unchanged 2017  . History of skin cancer    hand  . Obesity 11/11/2014  . Sleep apnea     Past Surgical History:  Procedure Laterality Date  . COLONOSCOPY WITH PROPOFOL N/A 05/09/2016   Procedure: COLONOSCOPY WITH PROPOFOL;  Surgeon: Lucilla Lame, MD;  Location: [facility] ENDOSCOPY;  Service: Endoscopy;  Laterality: N/A;  . HERNIA REPAIR    . KNEE ARTHROSCOPY     several scopes done  . MEDIAL PARTIAL KNEE REPLACEMENT Left    2000s?  Marland Kitchen SKIN CANCER EXCISION  Feb 2017   removed from right hand    Family History  Problem Relation Age of Onset  . Alcohol abuse Mother   . Rheum arthritis Mother   . Stroke Maternal Grandmother   . Stroke Paternal Grandfather   . Cancer Neg Hx   . Diabetes Neg Hx   . Heart disease Neg Hx   . Hypertension Neg Hx   . Kidney disease Neg Hx   . Prostate cancer Neg Hx     Social History Social History  Substance Use Topics  . Smoking status: Never Smoker  . Smokeless tobacco: Never Used  . Alcohol use No    No Known Allergies  Current Outpatient Prescriptions  Medication Sig Dispense Refill  . aspirin EC 81 MG tablet Take  1 tablet (81 mg total) by mouth daily. 30 tablet 11  . atorvastatin (LIPITOR) 10 MG tablet Take 1 tablet (10 mg total) by mouth at bedtime. 30 tablet 5  . buPROPion (WELLBUTRIN SR) 150 MG 12 hr tablet Take 1 tablet (150 mg total) by mouth 2 (two) times daily. 60 tablet 11  . DULoxetine (CYMBALTA) 60 MG capsule Take 1 capsule (60 mg total) by mouth daily. (this replaces venlafaxine) 30 capsule 5  . HYDROcodone-acetaminophen (NORCO/VICODIN) 5-325 MG tablet TAKE 1 TABLET BY MOUTH EVERY SIX HOURS AS NEEDED FOR MODERATE PAIN 30 tablet 0  . lisinopril (PRINIVIL,ZESTRIL) 5 MG tablet Take 0.5 tablets (2.5 mg total) by mouth daily. 45 tablet 1  . magnesium gluconate (MAGONATE) 500 MG tablet Take 500 mg by mouth daily.    . metFORMIN (GLUCOPHAGE) 500 MG tablet TAKE 1 TABLET BY MOUTH 2 TIMES DAILY WITH A MEAL. 60 tablet 5  . Multiple Vitamins-Minerals (MULTIVITAMIN ADULT PO) Take 1 tablet by mouth daily.    . tamsulosin (FLOMAX) 0.4 MG CAPS capsule Take 1 capsule (0.4 mg total) by mouth daily. 90 capsule 4  . vardenafil (LEVITRA) 20 MG tablet Take 1 tablet 2 hours prior  to intercourse on an empty stomach. 18 tablet 3   No current facility-administered medications for this visit.     Review of Systems Review of Systems  Constitutional: Negative.   Respiratory: Negative.   Cardiovascular: Negative.     Blood pressure 138/72, pulse 86, resp. rate 14, height 5\' 9"  (1.753 m), weight 265 lb (120.2 kg).  Physical Exam Physical Exam  Constitutional: He is oriented to person, place, and time. He appears well-developed and well-nourished.  HENT:  Mouth/Throat: Oropharynx is clear and moist.  Eyes: Conjunctivae are normal. No scleral icterus.  Neck: Neck supple.  Cardiovascular: Normal rate, regular rhythm and normal heart sounds.   Pulmonary/Chest: Effort normal and breath sounds normal.  Abdominal: Soft. Normal appearance. There is no splenomegaly or hepatomegaly. There is no tenderness.   Lymphadenopathy:    He has no cervical adenopathy.    He has axillary adenopathy.       Right: Inguinal adenopathy present.       Left: Inguinal adenopathy present.  2.5 cm left axillary node. 2 cm right axillary node 2  1 cm right inguinal node.  1.5 cm left inguinal node.  Neurological: He is alert and oriented to person, place, and time.  Skin: Skin is warm and dry.  Psychiatric: His behavior is normal.    Data Reviewed 06/05/2016 right axillary ultrasound: IMPRESSION: Lesions in the right axilla of an appearance most consistent with lymph nodes. Although definitive characterization is not possible trauma, they appear benign.  09/10/2006 CT of the abdomen and pelvis was reviewed with special attention to the inguinal area was reviewed. At least one lymph node in each groin noted, largest measuring 1.4 cm in diameter. These correspond to the palpable lesion on today's exam.  Assessment    Lymphadenopathy, no B symptoms to suggest lymphoma.    Plan    The axillary lymph nodes R Lawler more prominent on the left on clinical exam in the right, although they have not been symptomatic for the patient. Considering the results of the right side ultrasound have suggested that we get a chest x-ray to rule out mediastinal adenopathy and a CBC with differential. I think it's unlikely that were dealing with lymphoma. I can likely make him more uncomfortable by surgical excision, taking his attention away from the itching he is presently experiencing, but would rather plan a 6 month follow-up examination.  The patient was encouraged to report if he noticed fever, chills, night sweats or weight loss.  Chest X ray CBC Follow up in 6 months   This information has been scribed by Karie Fetch RN, BSN,BC.     Duane Hughes 06/20/2016, 3:24 PM

## 2016-06-21 ENCOUNTER — Telehealth: Payer: Self-pay | Admitting: *Deleted

## 2016-06-21 ENCOUNTER — Other Ambulatory Visit: Payer: Self-pay | Admitting: Family Medicine

## 2016-06-21 LAB — CBC WITH DIFFERENTIAL/PLATELET
BASOS: 1 %
Basophils Absolute: 0.1 10*3/uL (ref 0.0–0.2)
EOS (ABSOLUTE): 0.4 10*3/uL (ref 0.0–0.4)
EOS: 5 %
HEMATOCRIT: 43.6 % (ref 37.5–51.0)
Hemoglobin: 14.8 g/dL (ref 13.0–17.7)
IMMATURE GRANS (ABS): 0 10*3/uL (ref 0.0–0.1)
Immature Granulocytes: 0 %
LYMPHS: 26 %
Lymphocytes Absolute: 2 10*3/uL (ref 0.7–3.1)
MCH: 31.7 pg (ref 26.6–33.0)
MCHC: 33.9 g/dL (ref 31.5–35.7)
MCV: 93 fL (ref 79–97)
MONOS ABS: 0.6 10*3/uL (ref 0.1–0.9)
Monocytes: 8 %
NEUTROS ABS: 4.4 10*3/uL (ref 1.4–7.0)
Neutrophils: 60 %
PLATELETS: 260 10*3/uL (ref 150–379)
RBC: 4.67 x10E6/uL (ref 4.14–5.80)
RDW: 13.3 % (ref 12.3–15.4)
WBC: 7.4 10*3/uL (ref 3.4–10.8)

## 2016-06-21 NOTE — Telephone Encounter (Signed)
-----   Message from [name], MD sent at 06/21/2016  6:22 AM EST -----  Notify CBC OK, CXR OK. F/U 6 months as planned re: lymph node enlargement.  ----- Message ----- From: Interface, [facility] Lab Results In Sent: 06/21/2016   5:37 AM To: [name], MD

## 2016-06-21 NOTE — Telephone Encounter (Signed)
Notified patient as instructed, patient pleased. Discussed follow-up appointments, patient agrees  

## 2016-07-07 ENCOUNTER — Other Ambulatory Visit: Payer: Self-pay | Admitting: Family Medicine

## 2016-07-07 NOTE — Telephone Encounter (Signed)
[place] web site reviewed; no red flags Rx approved Patient may pick this up now

## 2016-07-07 NOTE — Telephone Encounter (Signed)
Left voicemail. Pt notified.

## 2016-07-09 DIAGNOSIS — G4733 Obstructive sleep apnea (adult) (pediatric): Secondary | ICD-10-CM | POA: Diagnosis not present

## 2016-07-13 ENCOUNTER — Ambulatory Visit: Payer: Medicare HMO | Admitting: Family Medicine

## 2016-08-01 ENCOUNTER — Other Ambulatory Visit: Payer: Self-pay | Admitting: Family Medicine

## 2016-08-01 DIAGNOSIS — E119 Type 2 diabetes mellitus without complications: Secondary | ICD-10-CM

## 2016-08-01 NOTE — Progress Notes (Signed)
Eye exam due per HM list Referral entered

## 2016-08-02 ENCOUNTER — Encounter: Payer: Self-pay | Admitting: Family Medicine

## 2016-08-02 ENCOUNTER — Ambulatory Visit
Admission: RE | Admit: 2016-08-02 | Discharge: 2016-08-02 | Disposition: A | Discharge status: Home / Self Care | Payer: Medicare HMO | Source: Ambulatory Visit | Attending: Family Medicine | Admitting: Family Medicine

## 2016-08-02 ENCOUNTER — Ambulatory Visit (INDEPENDENT_AMBULATORY_CARE_PROVIDER_SITE_OTHER): Payer: Medicare HMO | Admitting: Family Medicine

## 2016-08-02 ENCOUNTER — Other Ambulatory Visit: Payer: Self-pay | Admitting: Family Medicine

## 2016-08-02 VITALS — BP 126/82 | HR 97 | Temp 97.6°F | Resp 16 | Wt 269.3 lb

## 2016-08-02 DIAGNOSIS — M25521 Pain in right elbow: Secondary | ICD-10-CM

## 2016-08-02 DIAGNOSIS — M19021 Primary osteoarthritis, right elbow: Secondary | ICD-10-CM | POA: Insufficient documentation

## 2016-08-02 DIAGNOSIS — E119 Type 2 diabetes mellitus without complications: Secondary | ICD-10-CM

## 2016-08-02 DIAGNOSIS — M7989 Other specified soft tissue disorders: Secondary | ICD-10-CM | POA: Insufficient documentation

## 2016-08-02 DIAGNOSIS — S59901A Unspecified injury of right elbow, initial encounter: Secondary | ICD-10-CM | POA: Diagnosis not present

## 2016-08-02 DIAGNOSIS — W19XXXA Unspecified fall, initial encounter: Secondary | ICD-10-CM | POA: Diagnosis not present

## 2016-08-02 DIAGNOSIS — R590 Localized enlarged lymph nodes: Secondary | ICD-10-CM

## 2016-08-02 NOTE — Assessment & Plan Note (Signed)
Managed by surgeon

## 2016-08-02 NOTE — Assessment & Plan Note (Signed)
Foot exam done today by MD

## 2016-08-02 NOTE — Patient Instructions (Signed)
We'll get the xray across the street and contact you with the results If nothing shows we'll either get an MRI or refer to orthopaedist

## 2016-08-02 NOTE — Progress Notes (Signed)
BP 126/82   Pulse 97   Temp 97.6 F (36.4 C) (Oral)   Resp 16   Wt 269 lb 4.8 oz (122.2 kg)   SpO2 98%   BMI 39.77 kg/m    Subjective:    Patient ID: Duane Hughes, male    DOB: 07-03-1950, 66 y.o.   MRN: [id number]  HPI: Duane Hughes is a 66 y.o. male  Chief Complaint  Patient presents with  . Fall    wants to get xray having elbow pain   He was pullling a big garbage can behind him about 3 weeks ago, outside light is on the front; caught foot on corner of driveway and fell on the elbow; left wrist bothers him; right elbow is very sore and goes nuts if he makes a move; if he puts weight on it a certain way, even on the bed, and he'll know it Right-handed; no loss of strength in the right hand; some shooting pain down the forearm but not to the 4th and 5 fingers Nothing audible; significant swelling; "world class bruise" at the time which has since resolved completely He wants it checked out, gets shooting pain if he hits it or presses on it just right He has done ice and heat; he is still able to play golf He has been using hydrocodone prn for pain  Type 2 diabetes; some feeling of the nerves, tingling in both feet; progressive over many months/years; not ready to take medicine Blisters on dorsum of LEFT foot  He saw Dr. Bary Castilla; swelling under both axilla; did not think they were cancerous; just watching and waiting;  No unexpected weight loss, no night sweats  Depression screen [facility] 2/9 08/02/2016 05/22/2016 03/20/2016 02/24/2016 08/24/2015  Decreased Interest 0 0 0 1 0  Down, Depressed, Hopeless 0 0 1 0 0  PHQ - 2 Score 0 0 1 1 0   Relevant past medical, surgical, family and social history reviewed Past Medical History:  Diagnosis Date  . Arthritis    back, hands, knees  . Colon polyp 05/09/2016   TUBULAR ADENOMAS   . Depression   . Diabetes mellitus without complication ([place])   . Ectatic abdominal aorta ([place]) 04/18/2016   2.9 cm on [place] 2016, unchanged 2017  .  History of skin cancer    hand  . Obesity 11/11/2014  . Sleep apnea    Past Surgical History:  Procedure Laterality Date  . COLONOSCOPY WITH PROPOFOL N/A 05/09/2016   Procedure: COLONOSCOPY WITH PROPOFOL;  Surgeon: Lucilla Lame, MD;  Location: [facility] ENDOSCOPY;  Service: Endoscopy;  Laterality: N/A;  . HERNIA REPAIR    . KNEE ARTHROSCOPY     several scopes done  . MEDIAL PARTIAL KNEE REPLACEMENT Left    2000s?  Marland Kitchen SKIN CANCER EXCISION  Feb 2017   removed from right hand   Social History  Substance Use Topics  . Smoking status: Never Smoker  . Smokeless tobacco: Never Used  . Alcohol use No   Interim medical history since last visit reviewed. Allergies and medications reviewed  Review of Systems Per HPI unless specifically indicated above     Objective:    BP 126/82   Pulse 97   Temp 97.6 F (36.4 C) (Oral)   Resp 16   Wt 269 lb 4.8 oz (122.2 kg)   SpO2 98%   BMI 39.77 kg/m   Wt Readings from Last 3 Encounters:  08/02/16 269 lb 4.8 oz (122.2 kg)  06/20/16 265  lb (120.2 kg)  05/22/16 262 lb 3 oz (118.9 kg)    Physical Exam  Constitutional: He appears well-developed and well-nourished.  Obese male, no distress  Cardiovascular: Normal rate.   Pulses:      Radial pulses are 2+ on the right side, and 2+ on the left side.  Pulmonary/Chest: Effort normal.  Musculoskeletal:       Right elbow: He exhibits decreased range of motion and swelling. Tenderness found. Radial head tenderness noted.  Neurological:  Neurologically intact both distal arms/hands  Skin: No abrasion, no bruising and no laceration noted.   Diabetic Foot Form - Detailed   Diabetic Foot Exam - detailed Diabetic Foot exam was performed with the following findings:  Yes 08/02/2016 10:58 AM  Visual Foot Exam completed.:  Yes  Are the toenails ingrown?:  No Normal Range of Motion:  Yes Pulse Foot Exam completed.:  Yes  Right Dorsalis Pedis:  Present Left Dorsalis Pedis:  Present  Sensory Foot Exam  Completed.:  Yes Swelling:  No Semmes-Weinstein Monofilament Test R Site 1-Great Toe:  Pos L Site 1-Great Toe:  Pos  R Site 4:  Pos L Site 4:  Pos  R Site 5:  Pos L Site 5:  Pos           Assessment & Plan:   Problem List Items Addressed This Visit      Endocrine   Diabetes mellitus without complication ([place])    Foot exam done today by MD        Immune and Lymphatic   Lymphadenopathy, axillary    Managed by surgeon       Other Visit Diagnoses    Elbow pain, right    -  Primary   will get xray; if negative, will pursue further given the swelling 3 weeks out, with either MRI or referral to ortho   Relevant Orders   DG Elbow Complete Right (Completed)       Follow up plan: No Follow-up on file.  An after-visit summary was printed and given to the patient at [place].  Please see the patient instructions which may contain other information and recommendations beyond what is mentioned above in the assessment and plan.  No orders of the defined types were placed in this encounter.   Orders Placed This Encounter  Procedures  . DG Elbow Complete Right

## 2016-08-02 NOTE — Telephone Encounter (Signed)
Please resolve with pharmacy Bupropion was approved for a year in Feb 2018 Thank you

## 2016-08-02 NOTE — Telephone Encounter (Signed)
Called pharmacy, they sent by accident

## 2016-08-03 ENCOUNTER — Other Ambulatory Visit: Payer: Self-pay | Admitting: Family Medicine

## 2016-08-03 DIAGNOSIS — M25521 Pain in right elbow: Secondary | ICD-10-CM

## 2016-08-08 DIAGNOSIS — S53401A Unspecified sprain of right elbow, initial encounter: Secondary | ICD-10-CM | POA: Diagnosis not present

## 2016-08-08 DIAGNOSIS — E119 Type 2 diabetes mellitus without complications: Secondary | ICD-10-CM | POA: Diagnosis not present

## 2016-08-08 DIAGNOSIS — H40033 Anatomical narrow angle, bilateral: Secondary | ICD-10-CM | POA: Diagnosis not present

## 2016-08-08 LAB — HM DIABETES EYE EXAM

## 2016-08-09 DIAGNOSIS — G4733 Obstructive sleep apnea (adult) (pediatric): Secondary | ICD-10-CM | POA: Diagnosis not present

## 2016-08-11 ENCOUNTER — Other Ambulatory Visit: Payer: Self-pay | Admitting: Family Medicine

## 2016-08-11 NOTE — Telephone Encounter (Signed)
Last Cr and K+ reviewed; Rx approved 

## 2016-08-17 DIAGNOSIS — M25562 Pain in left knee: Secondary | ICD-10-CM | POA: Diagnosis not present

## 2016-08-17 DIAGNOSIS — M79641 Pain in right hand: Secondary | ICD-10-CM | POA: Diagnosis not present

## 2016-08-17 DIAGNOSIS — S80212A Abrasion, left knee, initial encounter: Secondary | ICD-10-CM | POA: Diagnosis not present

## 2016-08-17 DIAGNOSIS — S60811A Abrasion of right wrist, initial encounter: Secondary | ICD-10-CM | POA: Diagnosis not present

## 2016-08-18 ENCOUNTER — Other Ambulatory Visit: Payer: Self-pay | Admitting: Family Medicine

## 2016-08-18 NOTE — Telephone Encounter (Signed)
[place] web site reviewef for last year No red flags Rx approved; will be ready for pick up Monday

## 2016-09-05 ENCOUNTER — Encounter: Payer: Self-pay | Admitting: Family Medicine

## 2016-09-05 ENCOUNTER — Ambulatory Visit (INDEPENDENT_AMBULATORY_CARE_PROVIDER_SITE_OTHER): Payer: Medicare HMO | Admitting: Family Medicine

## 2016-09-05 VITALS — BP 138/78 | HR 85 | Temp 98.3°F | Resp 14 | Wt 269.4 lb

## 2016-09-05 DIAGNOSIS — M5432 Sciatica, left side: Secondary | ICD-10-CM

## 2016-09-05 DIAGNOSIS — M79671 Pain in right foot: Secondary | ICD-10-CM | POA: Insufficient documentation

## 2016-09-05 MED ORDER — PREDNISONE 10 MG PO TABS: ORAL_TABLET | ORAL | 0 refills | Status: AC

## 2016-09-05 NOTE — Assessment & Plan Note (Addendum)
Offered help; he says he knows what he needs to do; BMI >35 plus diabetes

## 2016-09-05 NOTE — Progress Notes (Signed)
BP 138/78   Pulse 85   Temp 98.3 F (36.8 C) (Oral)   Resp 14   Wt 269 lb 6.4 oz (122.2 kg)   SpO2 94%   BMI 39.78 kg/m    Subjective:    Patient ID: Duane Hughes, male    DOB: December 26, 1950, 66 y.o.   MRN: [id number]  HPI: Duane Hughes is a 66 y.o. male  Chief Complaint  Patient presents with  . Numbness    left leg and foot and sore on bottom right foot    HPI  Starting on Sunday, he was leaning down to pick up a 12 pack and it zapped through his body; went into sleep mode, real tingling; left left from knee down; felt sciatic thing that hits in his lower back; that's where it started and then shot down the leg The left leg feels asleep, not dead asleep, can still walk on it stayed aboutt he same, sometimes better and sometimes worse No dysfunction of B/B Has not really tried anything, same medicines Pain level goes up to 10 out of 10; right now about a 3 out of 10 He is now on meloxicam from ortho and not helping  Also has a sore on the bottom of the right foot; "it's weird"; right under the ball of the foot, if Duane Hughes rubs against it and pushes distally, sends a bolt of pain; if rubbing proximally, not painful; about a month duration  Lab Results  Component Value Date   HGBA1C 6.3 (H) 04/04/2016   Depression screen PHQ 2/9 09/05/2016 08/02/2016 05/22/2016 03/20/2016 02/24/2016  Decreased Interest 0 0 0 0 1  Down, Depressed, Hopeless 1 0 0 1 0  PHQ - 2 Score 1 0 0 1 1   Relevant past medical, surgical, family and social history reviewed Past Medical History:  Diagnosis Date  . Arthritis    back, hands, knees  . Colon polyp 05/09/2016   TUBULAR ADENOMAS   . Depression   . Diabetes mellitus without complication ([place])   . Ectatic abdominal aorta ([place]) 04/18/2016   2.9 cm on [place] 2016, unchanged 2017  . History of skin cancer    hand  . Morbid obesity ([place]) 11/11/2014  . Obesity 11/11/2014  . Sleep apnea    Past Surgical History:  Procedure Laterality Date    . COLONOSCOPY WITH PROPOFOL N/A 05/09/2016   Procedure: COLONOSCOPY WITH PROPOFOL;  Surgeon: Lucilla Lame, MD;  Location: [facility] ENDOSCOPY;  Service: Endoscopy;  Laterality: N/A;  . HERNIA REPAIR    . KNEE ARTHROSCOPY     several scopes done  . MEDIAL PARTIAL KNEE REPLACEMENT Left    2000s?  Marland Kitchen SKIN CANCER EXCISION  Feb 2017   removed from right hand   family history includes Alcohol abuse in his mother; Dementia in his paternal grandmother; Rheum arthritis in his mother; Stroke in his maternal grandmother and paternal grandfather. There is no history of Cancer, Diabetes, Heart disease, Hypertension, Kidney disease, or Prostate cancer. Social History   Social History  . Marital status: Married    Spouse name: N/A  . Number of children: N/A  . Years of education: N/A   Occupational History  . Not on file.   Social History Main Topics  . Smoking status: Current Every Day Smoker  . Smokeless tobacco: Never Used  . Alcohol use No  . Drug use: No  . Sexual activity: Yes    Partners: Female   Other Topics Concern  .  Not on file   Social History Narrative  . No narrative on file    Interim medical history since last visit reviewed. Allergies and medications reviewed  Review of Systems Per HPI unless specifically indicated above     Objective:    BP 138/78   Pulse 85   Temp 98.3 F (36.8 C) (Oral)   Resp 14   Wt 269 lb 6.4 oz (122.2 kg)   SpO2 94%   BMI 39.78 kg/m   Wt Readings from Last 3 Encounters:  09/05/16 269 lb 6.4 oz (122.2 kg)  08/02/16 269 lb 4.8 oz (122.2 kg)  06/20/16 265 lb (120.2 kg)    Physical Exam  Constitutional: He appears well-developed and well-nourished.  obese  Cardiovascular: Normal rate and regular rhythm.   Pulmonary/Chest: Effort normal and breath sounds normal.  Abdominal: He exhibits no distension.  Musculoskeletal: He exhibits no edema.  Neurological: He is alert. He displays normal reflexes.  Plantarflexion limited on the left;  dorsiflexion 5/5 but not comfortable; tightness in the adductor on the left; ROM normal  Psychiatric: He has a normal mood and affect.   Diabetic Foot Form - Detailed   Diabetic Foot Exam - detailed Diabetic Foot exam was performed with the following findings:  Yes 09/05/2016  8:35 AM  Visual Foot Exam completed.:  Yes  Normal Range of Motion:  Yes Pulse Foot Exam completed.:  Yes  Right Dorsalis Pedis:  Present Left Dorsalis Pedis:  Present  Sensory Foot Exam Completed.:  Yes Swelling:  No Semmes-Weinstein Monofilament Test R Site 1-Great Toe:  Pos L Site 1-Great Toe:  Pos  R Site 4:  Pos L Site 4:  Pos  R Site 5:  Pos L Site 5:  Pos    Comments:  Pain along ball of RIGHT foot      Results for orders placed or performed in visit on 08/11/16  HM DIABETES EYE EXAM  Result Value Ref Range   HM Diabetic Eye Exam No Retinopathy No Retinopathy      Assessment & Plan:   Problem List Items Addressed This Visit      Other   Right foot pain    ddx includes bone spur, neuroma; offered podiatry referral which was politely declined; patient is open to referral later if needed      Morbid obesity ([place])    Offered help; he says he knows what he needs to do; BMI >35 plus diabetes       Other Visit Diagnoses    Sciatica of left side    -  Primary   no red flags; pain not resolve with meloxicam plus hydrocodone; start prednisone (stop melox); reasons to seek immediate care reviewed; call if needed       Follow up plan: No Follow-up on file.  An after-visit summary was printed and given to the patient at [place].  Please see the patient instructions which may contain other information and recommendations beyond what is mentioned above in the assessment and plan.  Meds ordered this encounter  Medications  . DISCONTD: meloxicam (MOBIC) 15 MG tablet    Sig: Take 15 mg by mouth daily.  . predniSONE (DELTASONE) 10 MG tablet    Sig: Five pills by mouth today, then 4 on Wed, 3 on  Thurs, 2 on Fri, 1 on Saturday; take with food; no other NSAIDs    Dispense:  15 tablet    Refill:  0    No orders of the defined  types were placed in this encounter.

## 2016-09-05 NOTE — Assessment & Plan Note (Signed)
ddx includes bone spur, neuroma; offered podiatry referral which was politely declined; patient is open to referral later if needed

## 2016-09-05 NOTE — Patient Instructions (Addendum)
Start the prednisone, and take on a full stomach with food Stop the meloxicam while you are taking the prednisone You may resume the meloxicam the day after you stop the prednisone Let me know if you'd like to see a foot specialist   Sciatica Sciatica is pain, numbness, weakness, or tingling along the path of the sciatic nerve. The sciatic nerve starts in the lower back and runs down the back of each leg. The nerve controls the muscles in the lower leg and in the back of the knee. It also provides feeling (sensation) to the back of the thigh, the lower leg, and the sole of the foot. Sciatica is a symptom of another medical condition that pinches or puts pressure on the sciatic nerve. Generally, sciatica only affects one side of the body. Sciatica usually goes away on its own or with treatment. In some cases, sciatica may keep coming back (recur). What are the causes? This condition is caused by pressure on the sciatic nerve, or pinching of the sciatic nerve. This may be the result of:  A disk in between the bones of the spine (vertebrae) bulging out too far (herniated disk).  Age-related changes in the spinal disks (degenerative disk disease).  A pain disorder that affects a muscle in the buttock (piriformis syndrome).  Extra bone growth (bone spur) near the sciatic nerve.  An injury or break (fracture) of the pelvis.  Pregnancy.  Tumor (rare). What increases the risk? The following factors may make you more likely to develop this condition:  Playing sports that place pressure or stress on the spine, such as football or weight lifting.  Having poor strength and flexibility.  A history of back injury.  A history of back surgery.  Sitting for long periods of time.  Doing activities that involve repetitive bending or lifting.  Obesity. What are the signs or symptoms? Symptoms can vary from mild to very severe, and they may include:  Any of these problems in the lower back,  leg, hip, or buttock:  Mild tingling or dull aches.  Burning sensations.  Sharp pains.  Numbness in the back of the calf or the sole of the foot.  Leg weakness.  Severe back pain that makes movement difficult. These symptoms may get worse when you cough, sneeze, or laugh, or when you sit or stand for long periods of time. Being overweight may also make symptoms worse. In some cases, symptoms may recur over time. How is this diagnosed? This condition may be diagnosed based on:  Your symptoms.  A physical exam. Your health care provider may ask you to do certain movements to check whether those movements trigger your symptoms.  You may have tests, including:  Blood tests.  X-rays.  MRI.  CT scan. How is this treated? In many cases, this condition improves on its own, without any treatment. However, treatment may include:  Reducing or modifying physical activity during periods of pain.  Exercising and stretching to strengthen your abdomen and improve the flexibility of your spine.  Icing and applying heat to the affected area.  Medicines that help:  To relieve pain and swelling.  To relax your muscles.  Injections of medicines that help to relieve pain, irritation, and inflammation around the sciatic nerve (steroids).  Surgery. Follow these instructions at home: Medicines   Take over-the-counter and prescription medicines only as told by your health care provider.  Do not drive or operate heavy machinery while taking prescription pain medicine. Managing pain  If directed, apply ice to the affected area.  Put ice in a plastic bag.  Place a towel between your skin and the bag.  Leave the ice on for 20 minutes, 2-3 times a day.  After icing, apply heat to the affected area before you exercise or as often as told by your health care provider. Use the heat source that your health care provider recommends, such as a moist heat pack or a heating pad.  Place a  towel between your skin and the heat source.  Leave the heat on for 20-30 minutes.  Remove the heat if your skin turns bright red. This is especially important if you are unable to feel pain, heat, or cold. You may have a greater risk of getting burned. Activity   Return to your normal activities as told by your health care provider. Ask your health care provider what activities are safe for you.  Avoid activities that make your symptoms worse.  Take brief periods of rest throughout the day. Resting in a lying or standing position is usually better than sitting to rest.  When you rest for longer periods, mix in some mild activity or stretching between periods of rest. This will help to prevent stiffness and pain.  Avoid sitting for long periods of time without moving. Get up and move around at least one time each hour.  Exercise and stretch regularly, as told by your health care provider.  Do not lift anything that is heavier than 10 lb (4.5 kg) while you have symptoms of sciatica. When you do not have symptoms, you should still avoid heavy lifting, especially repetitive heavy lifting.  When you lift objects, always use proper lifting technique, which includes:  Bending your knees.  Keeping the load close to your body.  Avoiding twisting. General instructions   Use good posture.  Avoid leaning forward while sitting.  Avoid hunching over while standing.  Maintain a healthy weight. Excess weight puts extra stress on your back and makes it difficult to maintain good posture.  Wear supportive, comfortable shoes. Avoid wearing high heels.  Avoid sleeping on a mattress that is too soft or too hard. A mattress that is firm enough to support your back when you sleep may help to reduce your pain.  Keep all follow-up visits as told by your health care provider. This is important. Contact a health care provider if:  You have pain that wakes you up when you are sleeping.  You have  pain that gets worse when you lie down.  Your pain is worse than you have experienced in the past.  Your pain lasts longer than 4 weeks.  You experience unexplained weight loss. Get help right away if:  You lose control of your bowel or bladder (incontinence).  You have:  Weakness in your lower back, pelvis, buttocks, or legs that gets worse.  Redness or swelling of your back.  A burning sensation when you urinate. This information is not intended to replace advice given to you by your health care provider. Make sure you discuss any questions you have with your health care provider. Document Released: 04/11/2001 Document Revised: 09/21/2015 Document Reviewed: 12/25/2014 Elsevier Interactive Patient Education  2017 [employer].

## 2016-09-07 DIAGNOSIS — L821 Other seborrheic keratosis: Secondary | ICD-10-CM | POA: Diagnosis not present

## 2016-09-07 DIAGNOSIS — L812 Freckles: Secondary | ICD-10-CM | POA: Diagnosis not present

## 2016-09-07 DIAGNOSIS — L578 Other skin changes due to chronic exposure to nonionizing radiation: Secondary | ICD-10-CM | POA: Diagnosis not present

## 2016-09-07 DIAGNOSIS — R234 Changes in skin texture: Secondary | ICD-10-CM | POA: Diagnosis not present

## 2016-09-07 DIAGNOSIS — L570 Actinic keratosis: Secondary | ICD-10-CM | POA: Diagnosis not present

## 2016-09-07 DIAGNOSIS — Z85828 Personal history of other malignant neoplasm of skin: Secondary | ICD-10-CM | POA: Diagnosis not present

## 2016-09-08 DIAGNOSIS — G4733 Obstructive sleep apnea (adult) (pediatric): Secondary | ICD-10-CM | POA: Diagnosis not present

## 2016-09-13 ENCOUNTER — Other Ambulatory Visit: Payer: Self-pay | Admitting: Family Medicine

## 2016-09-13 NOTE — Telephone Encounter (Signed)
Last Cr reviewed; Rx approved 

## 2016-09-18 ENCOUNTER — Other Ambulatory Visit: Payer: Self-pay | Admitting: Family Medicine

## 2016-09-19 ENCOUNTER — Encounter: Payer: Self-pay | Admitting: Family Medicine

## 2016-09-19 ENCOUNTER — Ambulatory Visit (INDEPENDENT_AMBULATORY_CARE_PROVIDER_SITE_OTHER): Payer: Medicare HMO | Admitting: Family Medicine

## 2016-09-19 VITALS — BP 126/74 | HR 97 | Temp 98.5°F | Resp 14 | Wt 266.0 lb

## 2016-09-19 DIAGNOSIS — E119 Type 2 diabetes mellitus without complications: Secondary | ICD-10-CM

## 2016-09-19 DIAGNOSIS — Z1159 Encounter for screening for other viral diseases: Secondary | ICD-10-CM | POA: Diagnosis not present

## 2016-09-19 DIAGNOSIS — Z5181 Encounter for therapeutic drug level monitoring: Secondary | ICD-10-CM

## 2016-09-19 DIAGNOSIS — E781 Pure hyperglyceridemia: Secondary | ICD-10-CM | POA: Diagnosis not present

## 2016-09-19 DIAGNOSIS — I10 Essential (primary) hypertension: Secondary | ICD-10-CM | POA: Diagnosis not present

## 2016-09-19 DIAGNOSIS — Z79899 Other long term (current) drug therapy: Secondary | ICD-10-CM | POA: Diagnosis not present

## 2016-09-19 DIAGNOSIS — I77811 Abdominal aortic ectasia: Secondary | ICD-10-CM

## 2016-09-19 DIAGNOSIS — N400 Enlarged prostate without lower urinary tract symptoms: Secondary | ICD-10-CM

## 2016-09-19 MED ORDER — HYDROCODONE-ACETAMINOPHEN 5-325 MG PO TABS: 1.0000 | ORAL_TABLET | Freq: Four times a day (QID) | ORAL | 0 refills | Status: DC | PRN

## 2016-09-19 NOTE — Progress Notes (Signed)
BP 126/74   Pulse 97   Temp 98.5 F (36.9 C) (Oral)   Resp 14   Wt 266 lb (120.7 kg)   SpO2 96%   BMI 39.28 kg/m    Subjective:    Patient ID: Duane Hughes, male    DOB: 1950-08-16, 66 y.o.   MRN: [id number]  HPI: Duane Hughes is a 66 y.o. male  Chief Complaint  Patient presents with  . Follow-up    4 months     HPI  Type 2 diabetes mellitus No problems with feet No dry mouth; no blurred vision Eye exam UTD Lab Results  Component Value Date   HGBA1C 6.3 (H) 04/04/2016   Chronic pain; requested refill of hydrocodone No bad constipatiion; does not make him goofy or loopy or drugged Arthritis; using copper ring and copper bracelet; topicals have not helped as much Uses icy hot on sciatica, that helps  High cholesterol; on 10 mg atorvastatin; not intentionally limiting egg yolks, but only eats 4 to 5 eggs a week; some sausage Lab Results  Component Value Date   CHOL 137 04/04/2016   CHOL 142 09/02/2015   CHOL 187 03/19/2015   Lab Results  Component Value Date   HDL 41 04/04/2016   HDL 43 09/02/2015   HDL 41 03/19/2015   Lab Results  Component Value Date   LDLCALC 71 04/04/2016   LDLCALC 78 09/02/2015   LDLCALC 111 (H) 03/19/2015   Lab Results  Component Value Date   TRIG 125 04/04/2016   TRIG 103 09/02/2015   TRIG 175 (H) 03/19/2015   Lab Results  Component Value Date   CHOLHDL 3.3 04/04/2016   No results found for: LDLDIRECT  He asked about "lack of balance"; he plays golf; he does not have the balance he used to; wonders if medicine-related; has been a good athlete all his life; good hand-eye coorindation; no vertigo  Depression screen [facility] 2/9 09/19/2016 09/05/2016 08/02/2016 05/22/2016 03/20/2016  Decreased Interest 0 0 0 0 0  Down, Depressed, Hopeless 0 1 0 0 1  PHQ - 2 Score 0 1 0 0 1    Relevant past medical, surgical, family and social history reviewed Past Medical History:  Diagnosis Date  . Arthritis    back, hands, knees  .  Colon polyp 05/09/2016   TUBULAR ADENOMAS   . Depression   . Diabetes mellitus without complication ([place])   . Ectatic abdominal aorta ([place]) 04/18/2016   2.9 cm on [place] 2016, unchanged 2017  . History of skin cancer    hand  . Morbid obesity ([place]) 11/11/2014  . Obesity 11/11/2014  . Sleep apnea    Past Surgical History:  Procedure Laterality Date  . COLONOSCOPY WITH PROPOFOL N/A 05/09/2016   Procedure: COLONOSCOPY WITH PROPOFOL;  Surgeon: Lucilla Lame, MD;  Location: [facility] ENDOSCOPY;  Service: Endoscopy;  Laterality: N/A;  . HERNIA REPAIR    . KNEE ARTHROSCOPY     several scopes done  . MEDIAL PARTIAL KNEE REPLACEMENT Left    2000s?  Marland Kitchen SKIN CANCER EXCISION  Feb 2017   removed from right hand   Family History  Problem Relation Age of Onset  . Alcohol abuse Mother   . Rheum arthritis Mother   . Stroke Maternal Grandmother   . Stroke Paternal Grandfather   . Dementia Paternal Grandmother   . Cancer Neg Hx   . Diabetes Neg Hx   . Heart disease Neg Hx   . Hypertension Neg Hx   .  Kidney disease Neg Hx   . Prostate cancer Neg Hx    Social History   Social History  . Marital status: Married    Spouse name: N/A  . Number of children: N/A  . Years of education: N/A   Occupational History  . Not on file.   Social History Main Topics  . Smoking status: Former Research scientist (life sciences)  . Smokeless tobacco: Never Used  . Alcohol use No  . Drug use: No  . Sexual activity: Yes    Partners: Female   Other Topics Concern  . Not on file   Social History Narrative  . No narrative on file    Interim medical history since last visit reviewed. Allergies and medications reviewed  Review of Systems Per HPI unless specifically indicated above     Objective:    BP 126/74   Pulse 97   Temp 98.5 F (36.9 C) (Oral)   Resp 14   Wt 266 lb (120.7 kg)   SpO2 96%   BMI 39.28 kg/m   Wt Readings from Last 3 Encounters:  09/19/16 266 lb (120.7 kg)  09/05/16 269 lb 6.4 oz (122.2 kg)  08/02/16 269  lb 4.8 oz (122.2 kg)    Physical Exam  Constitutional: He appears well-developed and well-nourished.  Obese; weight down 3+ pounds over last 2 weeks  HENT:  Head: Normocephalic and atraumatic.  Mouth/Throat: No oropharyngeal exudate.  Eyes: No scleral icterus.  Neck: No JVD present.  Cardiovascular: Normal rate and regular rhythm.   Pulmonary/Chest: Effort normal and breath sounds normal.  Abdominal: Soft. Bowel sounds are normal. He exhibits no distension. There is no tenderness.  Musculoskeletal: He exhibits no edema.  Neurological: He is alert.  Skin: No pallor.  Psychiatric: He has a normal mood and affect. His behavior is normal. Judgment and thought content normal.   Diabetic Foot Form - Detailed   Diabetic Foot Exam - detailed Diabetic Foot exam was performed with the following findings:  Yes 09/19/2016  3:56 PM  Visual Foot Exam completed.:  Yes  Are the toenails ingrown?:  No Normal Range of Motion:  Yes Pulse Foot Exam completed.:  Yes  Right Dorsalis Pedis:  Present Left Dorsalis Pedis:  Present  Sensory Foot Exam Completed.:  Yes Swelling:  No Semmes-Weinstein Monofilament Test R Site 1-Great Toe:  Pos L Site 1-Great Toe:  Pos  R Site 4:  Pos L Site 4:  Pos  R Site 5:  Pos L Site 5:  Pos        Results for orders placed or performed in visit on 08/11/16  HM DIABETES EYE EXAM  Result Value Ref Range   HM Diabetic Eye Exam No Retinopathy No Retinopathy      Assessment & Plan:   Problem List Items Addressed This Visit      Cardiovascular and Mediastinum   Ectatic abdominal aorta ([place])    Work on weight, lowering LDL, etc      Benign hypertension    Controlled; continue same regimen        Endocrine   Diabetes mellitus without complication ([place]) - Primary    Check A1c; limit sweets, sugary drinks      Relevant Orders   Hemoglobin A1c     Other   Morbid obesity ([place])    Work on weight loss      Medication monitoring encounter    Check labs       Relevant Orders   COMPLETE METABOLIC PANEL WITH  GFR   Hypertriglyceridemia    Check fasting lipids      Relevant Orders   Lipid panel   Enlarged prostate    Patient to check with urologist about tamsulosin, because I think that's the cause of the balance issues      Controlled substance agreement signed    Checked [facility] web site for the last 12 months; no red flags       Other Visit Diagnoses    Encounter for hepatitis C screening test for low risk patient       Relevant Orders   Hepatitis C Antibody       Follow up plan: Return in about 6 months (around 03/22/2017) for twenty minute follow-up with fasting labs; 3 months for pain medicine.  An after-visit summary was printed and given to the patient at [facility].  Please see the patient instructions which may contain other information and recommendations beyond what is mentioned above in the assessment and plan.  Meds ordered this encounter  Medications  . meloxicam (MOBIC) 15 MG tablet    Sig: Mobic 15 mg tablet  Take 1 tablet every day by oral route.  Marland Kitchen HYDROcodone-acetaminophen (NORCO/VICODIN) 5-325 MG tablet    Sig: Take 1 tablet by mouth every 6 (six) hours as needed for moderate pain.    Dispense:  20 tablet    Refill:  0    Orders Placed This Encounter  Procedures  . Hepatitis C Antibody  . COMPLETE METABOLIC PANEL WITH GFR  . Hemoglobin A1c  . Lipid panel

## 2016-09-19 NOTE — Assessment & Plan Note (Signed)
Work on weight, lowering LDL, etc

## 2016-09-19 NOTE — Assessment & Plan Note (Signed)
Check labs 

## 2016-09-19 NOTE — Assessment & Plan Note (Signed)
Checked [employer] site for the last 12 months; no red flags

## 2016-09-19 NOTE — Assessment & Plan Note (Signed)
Controlled; continue same regimen

## 2016-09-19 NOTE — Patient Instructions (Addendum)
We are checking your labs today and will contact you with the results  Consider talking with your urologist about the tamsulosin to see if there is another option for your prostate health That medicine is likely causing balance issues Consider saw palmetto for prostate health Let me know if you would like a referral to physical therapy Consider tai chi or yoga Check out the information at familydoctor.org entitled "Nutrition for Weight Loss: What You Need to Know about Fad Diets" Try to lose between 1-2 pounds per week by taking in fewer calories and burning off more calories You can succeed by limiting portions, limiting foods dense in calories and fat, becoming more active, and drinking 8 glasses of water a day (64 ounces) Don't skip meals, especially breakfast, as skipping meals may alter your metabolism Do not use over-the-counter weight loss pills or gimmicks that claim rapid weight loss A healthy BMI (or body mass index) is between 18.5 and 24.9 You can calculate your ideal BMI at the [place] website [employer]fr Try to limit saturated fats in your diet (bologna, hot dogs, barbeque, cheeseburgers, hamburgers, steak, bacon, sausage, cheese, etc.) and get more fresh fruits, vegetables, and whole grains

## 2016-09-19 NOTE — Telephone Encounter (Signed)
Patient has an appt today will address then

## 2016-09-19 NOTE — Assessment & Plan Note (Signed)
Check A1c; limit sweets, sugary drinks

## 2016-09-19 NOTE — Assessment & Plan Note (Signed)
Check fasting lipids 

## 2016-09-19 NOTE — Assessment & Plan Note (Signed)
Work on weight loss.

## 2016-09-19 NOTE — Assessment & Plan Note (Signed)
Patient to check with urologist about tamsulosin, because I think that's the cause of the balance issues

## 2016-09-20 LAB — HEMOGLOBIN A1C
Hgb A1c MFr Bld: 6.4 % — ABNORMAL HIGH (ref <5.7)
MEAN PLASMA GLUCOSE: 137 mg/dL

## 2016-09-20 LAB — LIPID PANEL
CHOLESTEROL: 161 mg/dL (ref <200)
HDL: 45 mg/dL (ref >40)
LDL Cholesterol: 81 mg/dL (ref <100)
TRIGLYCERIDES: 177 mg/dL — AB (ref <150)
Total CHOL/HDL Ratio: 3.6 Ratio (ref <5.0)
VLDL: 35 mg/dL — AB (ref <30)

## 2016-09-20 LAB — COMPLETE METABOLIC PANEL WITH GFR
ALBUMIN: 4.1 g/dL (ref 3.6–5.1)
ALT: 34 U/L (ref 9–46)
AST: 33 U/L (ref 10–35)
Alkaline Phosphatase: 46 U/L (ref 40–115)
BUN: 15 mg/dL (ref 7–25)
CO2: 23 mmol/L (ref 20–31)
Calcium: 9 mg/dL (ref 8.6–10.3)
Chloride: 104 mmol/L (ref 98–110)
Creat: 1.01 mg/dL (ref 0.70–1.25)
GFR, EST NON AFRICAN AMERICAN: 77 mL/min (ref >=60)
GFR, Est African American: 89 mL/min (ref >=60)
GLUCOSE: 87 mg/dL (ref 65–99)
POTASSIUM: 4.6 mmol/L (ref 3.5–5.3)
SODIUM: 140 mmol/L (ref 135–146)
Total Bilirubin: 0.5 mg/dL (ref 0.2–1.2)
Total Protein: 6.4 g/dL (ref 6.1–8.1)

## 2016-09-20 LAB — HEPATITIS C ANTIBODY: HCV AB: NEGATIVE

## 2016-10-09 DIAGNOSIS — G4733 Obstructive sleep apnea (adult) (pediatric): Secondary | ICD-10-CM | POA: Diagnosis not present

## 2016-11-02 ENCOUNTER — Other Ambulatory Visit: Payer: Self-pay | Admitting: Family Medicine

## 2016-11-02 ENCOUNTER — Encounter: Payer: Self-pay | Admitting: Urology

## 2016-11-02 NOTE — Telephone Encounter (Signed)
Alt and lipids from May reviewed; Rx approved

## 2016-11-03 ENCOUNTER — Other Ambulatory Visit: Payer: Self-pay | Admitting: Family Medicine

## 2016-11-03 NOTE — Telephone Encounter (Signed)
Ready for pick-up Reviewed [employer] site

## 2016-11-09 ENCOUNTER — Encounter: Payer: Self-pay | Admitting: Urology

## 2016-11-16 ENCOUNTER — Ambulatory Visit (INDEPENDENT_AMBULATORY_CARE_PROVIDER_SITE_OTHER): Payer: Medicare HMO | Admitting: Family Medicine

## 2016-11-16 ENCOUNTER — Encounter: Payer: Self-pay | Admitting: Family Medicine

## 2016-11-16 VITALS — BP 122/68 | HR 84 | Temp 98.0°F | Resp 14 | Wt 270.6 lb

## 2016-11-16 DIAGNOSIS — R6889 Other general symptoms and signs: Secondary | ICD-10-CM | POA: Diagnosis not present

## 2016-11-16 DIAGNOSIS — M899 Disorder of bone, unspecified: Secondary | ICD-10-CM | POA: Diagnosis not present

## 2016-11-16 DIAGNOSIS — Z8639 Personal history of other endocrine, nutritional and metabolic disease: Secondary | ICD-10-CM | POA: Diagnosis not present

## 2016-11-16 DIAGNOSIS — R799 Abnormal finding of blood chemistry, unspecified: Secondary | ICD-10-CM | POA: Diagnosis not present

## 2016-11-16 DIAGNOSIS — R5383 Other fatigue: Secondary | ICD-10-CM | POA: Diagnosis not present

## 2016-11-16 DIAGNOSIS — F331 Major depressive disorder, recurrent, moderate: Secondary | ICD-10-CM

## 2016-11-16 DIAGNOSIS — E559 Vitamin D deficiency, unspecified: Secondary | ICD-10-CM | POA: Diagnosis not present

## 2016-11-16 DIAGNOSIS — R69 Illness, unspecified: Secondary | ICD-10-CM | POA: Diagnosis not present

## 2016-11-16 NOTE — Patient Instructions (Addendum)
Let's get labs today; we'll contact you about the results Have a strategy or plan for your trip and when you come home We'll have you see the psychiatrist  Living With Depression Everyone experiences occasional disappointment, sadness, and loss in their lives. When you are feeling down, blue, or sad for at least 2 weeks in a row, it may mean that you have depression. Depression can affect your thoughts and feelings, relationships, daily activities, and physical health. It is caused by changes in the way your brain functions. If you receive a diagnosis of depression, your health care provider will tell you which type of depression you have and what treatment options are available to you. If you are living with depression, there are ways to help you recover from it and also ways to prevent it from coming back. How to cope with lifestyle changes Coping with stress Stress is your body's reaction to life changes and events, both good and bad. Stressful situations may include:  Getting married.  The death of a spouse.  Losing a job.  Retiring.  Having a baby.  Stress can last just a few hours or it can be ongoing. Stress can play a major role in depression, so it is important to learn both how to cope with stress and how to think about it differently. Talk with your health care provider or a counselor if you would like to learn more about stress reduction. He or she may suggest some stress reduction techniques, such as:  Music therapy. This can include creating music or listening to music. Choose music that you enjoy and that inspires you.  Mindfulness-based meditation. This kind of meditation can be done while sitting or walking. It involves being aware of your normal breaths, rather than trying to control your breathing.  Centering prayer. This is a kind of meditation that involves focusing on a spiritual word or phrase. Choose a word, phrase, or sacred image that is meaningful to you and  that brings you peace.  Deep breathing. To do this, expand your stomach and inhale slowly through your nose. Hold your breath for 3-5 seconds, then exhale slowly, allowing your stomach muscles to relax.  Muscle relaxation. This involves intentionally tensing muscles then relaxing them.  Choose a stress reduction technique that fits your lifestyle and personality. Stress reduction techniques take time and practice to develop. Set aside 5-15 minutes a day to do them. Therapists can offer training in these techniques. The training may be covered by some insurance plans. Other things you can do to manage stress include:  Keeping a stress diary. This can help you learn what triggers your stress and ways to control your response.  Understanding what your limits are and saying no to requests or events that lead to a schedule that is too full.  Thinking about how you respond to certain situations. You may not be able to control everything, but you can control how you react.  Adding humor to your life by watching funny films or TV shows.  Making time for activities that help you relax and not feeling guilty about spending your time this way.  Medicines Your health care provider may suggest certain medicines if he or she feels that they will help improve your condition. Avoid using alcohol and other substances that may prevent your medicines from working properly (may interact). It is also important to:  Talk with your pharmacist or health care provider about all the medicines that you take, their possible side  effects, and what medicines are safe to take together.  Make it your goal to take part in all treatment decisions (shared decision-making). This includes giving input on the side effects of medicines. It is best if shared decision-making with your health care provider is part of your total treatment plan.  If your health care provider prescribes a medicine, you may not notice the full benefits  of it for 4-8 weeks. Most people who are treated for depression need to be on medicine for at least 6-12 months after they feel better. If you are taking medicines as part of your treatment, do not stop taking medicines without first talking to your health care provider. You may need to have the medicine slowly decreased (tapered) over time to decrease the risk of harmful side effects. Relationships Your health care provider may suggest family therapy along with individual therapy and drug therapy. While there may not be family problems that are causing you to feel depressed, it is still important to make sure your family learns as much as they can about your mental health. Having your family's support can help make your treatment successful. How to recognize changes in your condition Everyone has a different response to treatment for depression. Recovery from major depression happens when you have not had signs of major depression for two months. This may mean that you will start to:  Have more interest in doing activities.  Feel less hopeless than you did 2 months ago.  Have more energy.  Overeat less often, or have better or improving appetite.  Have better concentration.  Your health care provider will work with you to decide the next steps in your recovery. It is also important to recognize when your condition is getting worse. Watch for these signs:  Having fatigue or low energy.  Eating too much or too little.  Sleeping too much or too little.  Feeling restless, agitated, or hopeless.  Having trouble concentrating or making decisions.  Having unexplained physical complaints.  Feeling irritable, angry, or aggressive.  Get help as soon as you or your family members notice these symptoms coming back. How to get support and help from others How to talk with friends and family members about your condition Talking to friends and family members about your condition can provide you  with one way to get support and guidance. Reach out to trusted friends or family members, explain your symptoms to them, and let them know that you are working with a health care provider to treat your depression. Financial resources Not all insurance plans cover mental health care, so it is important to check with your insurance carrier. If paying for co-pays or counseling services is a problem, search for a local or county mental health care center. They may be able to offer public mental health care services at low or no cost when you are not able to see a private health care provider. If you are taking medicine for depression, you may be able to get the generic form, which may be less expensive. Some makers of prescription medicines also offer help to patients who cannot afford the medicines they need. Follow these instructions at home:  Get the right amount and quality of sleep.  Cut down on using caffeine, tobacco, alcohol, and other potentially harmful substances.  Try to exercise, such as walking or lifting small weights.  Take over-the-counter and prescription medicines only as told by your health care provider.  Eat a healthy diet that includes  plenty of vegetables, fruits, whole grains, low-fat dairy products, and lean protein. Do not eat a lot of foods that are high in solid fats, added sugars, or salt.  Keep all follow-up visits as told by your health care provider. This is important. Contact a health care provider if:  You stop taking your antidepressant medicines, and you have any of these symptoms: ? Nausea. ? Headache. ? Feeling lightheaded. ? Chills and body aches. ? Not being able to sleep (insomnia).  You or your friends and family think your depression is getting worse. Get help right away if:  You have thoughts of hurting yourself or others. If you ever feel like you may hurt yourself or others, or have thoughts about taking your own life, get help right away. You  can go to your nearest emergency department or call:  Your local emergency services (911 in the [place]  A suicide crisis helpline, such as the [facility] at (316)073-2612. This is open 24-hours a day.  Summary  If you are living with depression, there are ways to help you recover from it and also ways to prevent it from coming back.  Work with your health care team to create a management plan that includes counseling, stress management techniques, and healthy lifestyle habits. This information is not intended to replace advice given to you by your health care provider. Make sure you discuss any questions you have with your health care provider. Document Released: 03/20/2016 Document Revised: 03/20/2016 Document Reviewed: 03/20/2016 Elsevier Interactive Patient Education  [employer].

## 2016-11-16 NOTE — Progress Notes (Signed)
BP 122/68   Pulse 84   Temp 98 F (36.7 C) (Oral)   Resp 14   Wt 270 lb 9.6 oz (122.7 kg)   SpO2 96%   BMI 39.96 kg/m    Subjective:    Patient ID: Duane Hughes, male    DOB: 01/30/1951, 66 y.o.   MRN: [id number]  HPI: Duane Hughes is a 66 y.o. male  Chief Complaint  Patient presents with  . discuss medication    HPI Patient is here to discuss his depression Really started to get worse 1.5 weeks ago; got real bad over the weekend Just didn't want to be around any body Sort of maintaining Getting really angry on the [place]; did not want to be there and that is not him He did not want to be around people but went to church He's been praying and doing those things and keep up the readings Just doesn't feel good Gets angry when driving, no thoughts of hurting any one or running anyone of the road Having thoughts of self harm; got into a negative spiral Feels like a burden to his wife Fleeting thoughts; wouldn't do that because it's messy, etc; he doesn't want to do that; really hard to think clearly No previous suicide attempts, no fam hx of suicide attempts He has been on the cymbalta for 2 years; it seems to him that he goes about two years and then the medicine doesn't work quite as well Other medicines in the past included venlafaxine (Effexor) Also on bupropion A little more fatigued, not to point of exhaustion; taking more naps and then spends most of evening out Intimate once a week, about the same as before; her drive is less b/c of menopause He took testosterone before; used topical Home at 11:30 pm; no sleep shift outdoors He does not think the pain pill causing any depression He is driving a pick-up tomorrow; he'll drive, going to [place] in truck two-door, beach for a week  Depression screen [facility] 2/9 11/16/2016 09/19/2016 09/05/2016 08/02/2016 05/22/2016  Decreased Interest 1 0 0 0 0  Down, Depressed, Hopeless 3 0 1 0 0  PHQ - 2 Score 4 0 1 0 0    Altered sleeping 2 - - - -  Tired, decreased energy 3 - - - -  Change in appetite 1 - - - -  Feeling bad or failure about yourself  1 - - - -  Trouble concentrating 1 - - - -  Moving slowly or fidgety/restless 0 - - - -  Suicidal thoughts 1 - - - -  PHQ-9 Score 13 - - - -  Difficult doing work/chores Somewhat difficult - - - -    Relevant past medical, surgical, family and social history reviewed Past Medical History:  Diagnosis Date  . Arthritis    back, hands, knees  . Colon polyp 05/09/2016   TUBULAR ADENOMAS   . Depression   . Diabetes mellitus without complication ([place])   . Ectatic abdominal aorta ([place]) 04/18/2016   2.9 cm on [place] 2016, unchanged 2017  . History of skin cancer    hand  . Morbid obesity ([place]) 11/11/2014  . Obesity 11/11/2014  . Sleep apnea    Past Surgical History:  Procedure Laterality Date  . COLONOSCOPY WITH PROPOFOL N/A 05/09/2016   Procedure: COLONOSCOPY WITH PROPOFOL;  Surgeon: Lucilla Lame, MD;  Location: [facility] ENDOSCOPY;  Service: Endoscopy;  Laterality: N/A;  . HERNIA REPAIR    .  KNEE ARTHROSCOPY     several scopes done  . MEDIAL PARTIAL KNEE REPLACEMENT Left    2000s?  Marland Kitchen SKIN CANCER EXCISION  Feb 2017   removed from right hand   Family History  Problem Relation Age of Onset  . Alcohol abuse Mother   . Rheum arthritis Mother   . Stroke Maternal Grandmother   . Stroke Paternal Grandfather   . Dementia Paternal Grandmother   . Cancer Neg Hx   . Diabetes Neg Hx   . Heart disease Neg Hx   . Hypertension Neg Hx   . Kidney disease Neg Hx   . Prostate cancer Neg Hx    Social History   Social History  . Marital status: Married    Spouse name: N/A  . Number of children: N/A  . Years of education: N/A   Occupational History  . Not on file.   Social History Main Topics  . Smoking status: Never Smoker  . Smokeless tobacco: Never Used  . Alcohol use No  . Drug use: No  . Sexual activity: Yes    Partners: Female   Other Topics Concern   . Not on file   Social History Narrative  . No narrative on file   Interim medical history since last visit reviewed. Allergies and medications reviewed  Review of Systems Per HPI unless specifically indicated above     Objective:    BP 122/68   Pulse 84   Temp 98 F (36.7 C) (Oral)   Resp 14   Wt 270 lb 9.6 oz (122.7 kg)   SpO2 96%   BMI 39.96 kg/m   Wt Readings from Last 3 Encounters:  11/16/16 270 lb 9.6 oz (122.7 kg)  09/19/16 266 lb (120.7 kg)  09/05/16 269 lb 6.4 oz (122.2 kg)    Physical Exam  Constitutional: He appears well-developed and well-nourished.  Obese; weight gain 4+ pounds over last 2 months  HENT:  Head: Normocephalic and atraumatic.  Mouth/Throat: No oropharyngeal exudate.  Eyes: No scleral icterus.  Neck: No JVD present.  Cardiovascular: Normal rate and regular rhythm.   Pulmonary/Chest: Effort normal and breath sounds normal.  Abdominal: Soft. Bowel sounds are normal. He exhibits no distension. There is no tenderness.  Musculoskeletal: He exhibits no edema.  Neurological: He is alert.  Skin: No pallor.  Psychiatric: He has a normal mood and affect. His behavior is normal. Judgment and thought content normal. His mood appears not anxious. His speech is not delayed, not tangential and not slurred. He is not slowed and not withdrawn. Cognition and memory are not impaired. He does not exhibit a depressed mood. He expresses no homicidal and no suicidal ideation.  Good eye contact with examiner; did not appear despondent   Diabetic Foot Form - Detailed   Diabetic Foot Exam - detailed Diabetic Foot exam was performed with the following findings:  Yes 11/16/2016  6:08 PM  Visual Foot Exam completed.:  Yes  Pulse Foot Exam completed.:  Yes  Sensory Foot Exam Completed.:  Yes Semmes-Weinstein Monofilament Test         Assessment & Plan:   Problem List Items Addressed This Visit      Other   Major depressive disorder, recurrent ([place]) - Primary     Offered referral to psychiatrist; check thyroid, vit D, vitamin B12; discussed safety, counseling if needed in area where he is vacationing, list of providers and phone numbers given; discussed stress reduction, dealing with stress; medication adjustment after we  check labs first; will contact him after labs are back; close f/u      Relevant Orders   TSH (Completed)   T4, free (Completed)   Ambulatory referral to Psychiatry   Hx of hypogonadism    Check labs to evaluate testosterone level       Other Visit Diagnoses    Other fatigue       Relevant Orders   B12 (Completed)   VITAMIN D 25 Hydroxy (Vit-D Deficiency, Fractures) (Completed)   Testosterone (Completed)   Abnormal blood finding       Relevant Orders   Lipid panel (Completed)   Nonspecific abnormal finding       Disorder of bone, unspecified       degenerative changes of lumbar spine; will check vit D       Follow up plan: Return in about 3 weeks (around 12/07/2016) for follow-up visit with Dr. Sanda Klein.  An after-visit summary was printed and given to the patient at [facility].  Please see the patient instructions which may contain other information and recommendations beyond what is mentioned above in the assessment and plan.  Meds ordered this encounter  Medications  . TURMERIC PO    Sig: Take by mouth.    Orders Placed This Encounter  Procedures  . TSH  . T4, free  . B12  . VITAMIN D 25 Hydroxy (Vit-D Deficiency, Fractures)  . Testosterone  . Lipid panel  . Ambulatory referral to Psychiatry

## 2016-11-16 NOTE — Assessment & Plan Note (Addendum)
Offered referral to psychiatrist; check thyroid, vit D, vitamin B12; discussed safety, counseling if needed in area where he is vacationing, list of providers and phone numbers given; discussed stress reduction, dealing with stress; medication adjustment after we check labs first; will contact him after labs are back; close f/u

## 2016-11-17 ENCOUNTER — Other Ambulatory Visit: Payer: Self-pay | Admitting: Family Medicine

## 2016-11-17 DIAGNOSIS — Z8639 Personal history of other endocrine, nutritional and metabolic disease: Secondary | ICD-10-CM

## 2016-11-17 LAB — VITAMIN D 25 HYDROXY (VIT D DEFICIENCY, FRACTURES): Vit D, 25-Hydroxy: 18 ng/mL — ABNORMAL LOW (ref 30–100)

## 2016-11-17 LAB — T4, FREE: Free T4: 1 ng/dL (ref 0.8–1.8)

## 2016-11-17 LAB — LIPID PANEL
CHOL/HDL RATIO: 4.9 ratio (ref <5.0)
CHOLESTEROL: 151 mg/dL (ref <200)
HDL: 31 mg/dL — AB (ref >40)
TRIGLYCERIDES: 492 mg/dL — AB (ref <150)

## 2016-11-17 LAB — VITAMIN B12: Vitamin B-12: 566 pg/mL (ref 200–1100)

## 2016-11-17 LAB — TSH: TSH: 1.44 mIU/L (ref 0.40–4.50)

## 2016-11-17 LAB — TESTOSTERONE: TESTOSTERONE: 331 ng/dL (ref 250–827)

## 2016-11-17 MED ORDER — VITAMIN D (ERGOCALCIFEROL) 1.25 MG (50000 UNIT) PO CAPS: 50000.0000 [IU] | ORAL_CAPSULE | ORAL | 1 refills | Status: DC

## 2016-11-17 MED ORDER — DESVENLAFAXINE SUCCINATE ER 100 MG PO TB24: 100.0000 mg | ORAL_TABLET | Freq: Every day | ORAL | 0 refills | Status: DC

## 2016-11-17 MED ORDER — DESVENLAFAXINE SUCCINATE ER 100 MG PO TB24
100.0000 mg | ORAL_TABLET | Freq: Every day | ORAL | 0 refills | Status: DC
Start: 2016-11-17 — End: 2016-12-21

## 2016-11-17 NOTE — Assessment & Plan Note (Signed)
Recheck early AM testosterone

## 2016-11-17 NOTE — Progress Notes (Signed)
Recheck early AM testosterone in 10 days Change cymbalta to pristiq Start Rx vit D

## 2016-11-21 ENCOUNTER — Encounter: Payer: Self-pay | Admitting: Family Medicine

## 2016-11-21 DIAGNOSIS — E559 Vitamin D deficiency, unspecified: Secondary | ICD-10-CM | POA: Insufficient documentation

## 2016-11-21 DIAGNOSIS — R6889 Other general symptoms and signs: Secondary | ICD-10-CM | POA: Insufficient documentation

## 2016-11-28 NOTE — Assessment & Plan Note (Signed)
Check labs to evaluate testosterone level

## 2016-11-29 ENCOUNTER — Encounter: Payer: Self-pay | Admitting: Family Medicine

## 2016-11-30 NOTE — Telephone Encounter (Signed)
Please check on psychiatry referral and work to get him seen ASAP Then contact patient

## 2016-12-04 ENCOUNTER — Other Ambulatory Visit: Payer: Self-pay | Admitting: Family Medicine

## 2016-12-04 ENCOUNTER — Ambulatory Visit
Admission: RE | Admit: 2016-12-04 | Discharge: 2016-12-04 | Disposition: A | Discharge status: Home / Self Care | Payer: Medicare HMO | Source: Ambulatory Visit | Attending: Family Medicine | Admitting: Family Medicine

## 2016-12-04 DIAGNOSIS — R52 Pain, unspecified: Secondary | ICD-10-CM | POA: Insufficient documentation

## 2016-12-04 DIAGNOSIS — M160 Bilateral primary osteoarthritis of hip: Secondary | ICD-10-CM | POA: Diagnosis not present

## 2016-12-04 DIAGNOSIS — M778 Other enthesopathies, not elsewhere classified: Secondary | ICD-10-CM | POA: Insufficient documentation

## 2016-12-04 DIAGNOSIS — M25551 Pain in right hip: Secondary | ICD-10-CM

## 2016-12-04 DIAGNOSIS — M47816 Spondylosis without myelopathy or radiculopathy, lumbar region: Secondary | ICD-10-CM | POA: Insufficient documentation

## 2016-12-04 NOTE — Progress Notes (Signed)
Orders for right hip xray entered; patient told me Friday that he was having pain with flexion

## 2016-12-04 NOTE — Telephone Encounter (Signed)
I spoke with patient on Friday; gave another number for him to try and explained that my office manager will check on this referral on Monday and try to get this going; he denies wanting to act on any SI; discussed inpatient treatment as option to get medicines sorted out and get him support; he agrees with plan and will seek attention over weekend if needed

## 2016-12-05 ENCOUNTER — Telehealth: Payer: Self-pay | Admitting: Family Medicine

## 2016-12-05 NOTE — Telephone Encounter (Signed)
Pt informed and will give [place] a call back after he speak with his insurance company

## 2016-12-05 NOTE — Telephone Encounter (Signed)
Please have patient call his insurance to find out which office is covered by his insurance

## 2016-12-05 NOTE — Telephone Encounter (Signed)
Bland Span from [facility] states that they will not be able to see this patient. They do not accept his insurance.

## 2016-12-06 ENCOUNTER — Encounter: Payer: Self-pay | Admitting: Family Medicine

## 2016-12-07 ENCOUNTER — Encounter: Payer: Self-pay | Admitting: Family Medicine

## 2016-12-07 ENCOUNTER — Ambulatory Visit (INDEPENDENT_AMBULATORY_CARE_PROVIDER_SITE_OTHER): Payer: Medicare HMO | Admitting: Family Medicine

## 2016-12-07 VITALS — BP 118/68 | HR 89 | Temp 98.5°F | Resp 14 | Wt 268.5 lb

## 2016-12-07 DIAGNOSIS — F331 Major depressive disorder, recurrent, moderate: Secondary | ICD-10-CM | POA: Diagnosis not present

## 2016-12-07 DIAGNOSIS — M25551 Pain in right hip: Secondary | ICD-10-CM

## 2016-12-07 DIAGNOSIS — M25552 Pain in left hip: Secondary | ICD-10-CM

## 2016-12-07 DIAGNOSIS — G8929 Other chronic pain: Secondary | ICD-10-CM

## 2016-12-07 DIAGNOSIS — R69 Illness, unspecified: Secondary | ICD-10-CM | POA: Diagnosis not present

## 2016-12-07 MED ORDER — ARIPIPRAZOLE 5 MG PO TABS
5.0000 mg | ORAL_TABLET | Freq: Every day | ORAL | 0 refills | Status: DC
Start: 2016-12-07 — End: 2017-03-01

## 2016-12-07 MED ORDER — OXYCODONE-ACETAMINOPHEN 5-325 MG PO TABS: 1.0000 | ORAL_TABLET | Freq: Four times a day (QID) | ORAL | 0 refills | Status: DC | PRN

## 2016-12-07 NOTE — Assessment & Plan Note (Signed)
Refer to orthopaedist 

## 2016-12-07 NOTE — Patient Instructions (Addendum)
I've put in the referral to Dr. Gladstone Lighter Stop the hydrocodone Use the oxycodone/apap (Percocet) if needed for moderate to severe pain Try to get 64 ounces of water a day and 38 grams of fiber a day Continue Pristiq Add Abilify at night   Living With Depression Everyone experiences occasional disappointment, sadness, and loss in their lives. When you are feeling down, blue, or sad for at least 2 weeks in a row, it may mean that you have depression. Depression can affect your thoughts and feelings, relationships, daily activities, and physical health. It is caused by changes in the way your brain functions. If you receive a diagnosis of depression, your health care provider will tell you which type of depression you have and what treatment options are available to you. If you are living with depression, there are ways to help you recover from it and also ways to prevent it from coming back. How to cope with lifestyle changes Coping with stress Stress is your body's reaction to life changes and events, both good and bad. Stressful situations may include:  Getting married.  The death of a spouse.  Losing a job.  Retiring.  Having a baby.  Stress can last just a few hours or it can be ongoing. Stress can play a major role in depression, so it is important to learn both how to cope with stress and how to think about it differently. Talk with your health care provider or a counselor if you would like to learn more about stress reduction. He or she may suggest some stress reduction techniques, such as:  Music therapy. This can include creating music or listening to music. Choose music that you enjoy and that inspires you.  Mindfulness-based meditation. This kind of meditation can be done while sitting or walking. It involves being aware of your normal breaths, rather than trying to control your breathing.  Centering prayer. This is a kind of meditation that involves focusing on a spiritual  word or phrase. Choose a word, phrase, or sacred image that is meaningful to you and that brings you peace.  Deep breathing. To do this, expand your stomach and inhale slowly through your nose. Hold your breath for 3-5 seconds, then exhale slowly, allowing your stomach muscles to relax.  Muscle relaxation. This involves intentionally tensing muscles then relaxing them.  Choose a stress reduction technique that fits your lifestyle and personality. Stress reduction techniques take time and practice to develop. Set aside 5-15 minutes a day to do them. Therapists can offer training in these techniques. The training may be covered by some insurance plans. Other things you can do to manage stress include:  Keeping a stress diary. This can help you learn what triggers your stress and ways to control your response.  Understanding what your limits are and saying no to requests or events that lead to a schedule that is too full.  Thinking about how you respond to certain situations. You may not be able to control everything, but you can control how you react.  Adding humor to your life by watching funny films or TV shows.  Making time for activities that help you relax and not feeling guilty about spending your time this way.  Medicines Your health care provider may suggest certain medicines if he or she feels that they will help improve your condition. Avoid using alcohol and other substances that may prevent your medicines from working properly (may interact). It is also important to:  Talk with  your pharmacist or health care provider about all the medicines that you take, their possible side effects, and what medicines are safe to take together.  Make it your goal to take part in all treatment decisions (shared decision-making). This includes giving input on the side effects of medicines. It is best if shared decision-making with your health care provider is part of your total treatment plan.  If  your health care provider prescribes a medicine, you may not notice the full benefits of it for 4-8 weeks. Most people who are treated for depression need to be on medicine for at least 6-12 months after they feel better. If you are taking medicines as part of your treatment, do not stop taking medicines without first talking to your health care provider. You may need to have the medicine slowly decreased (tapered) over time to decrease the risk of harmful side effects. Relationships Your health care provider may suggest family therapy along with individual therapy and drug therapy. While there may not be family problems that are causing you to feel depressed, it is still important to make sure your family learns as much as they can about your mental health. Having your family's support can help make your treatment successful. How to recognize changes in your condition Everyone has a different response to treatment for depression. Recovery from major depression happens when you have not had signs of major depression for two months. This may mean that you will start to:  Have more interest in doing activities.  Feel less hopeless than you did 2 months ago.  Have more energy.  Overeat less often, or have better or improving appetite.  Have better concentration.  Your health care provider will work with you to decide the next steps in your recovery. It is also important to recognize when your condition is getting worse. Watch for these signs:  Having fatigue or low energy.  Eating too much or too little.  Sleeping too much or too little.  Feeling restless, agitated, or hopeless.  Having trouble concentrating or making decisions.  Having unexplained physical complaints.  Feeling irritable, angry, or aggressive.  Get help as soon as you or your family members notice these symptoms coming back. How to get support and help from others How to talk with friends and family members about your  condition Talking to friends and family members about your condition can provide you with one way to get support and guidance. Reach out to trusted friends or family members, explain your symptoms to them, and let them know that you are working with a health care provider to treat your depression. Financial resources Not all insurance plans cover mental health care, so it is important to check with your insurance carrier. If paying for co-pays or counseling services is a problem, search for a local or county mental health care center. They may be able to offer public mental health care services at low or no cost when you are not able to see a private health care provider. If you are taking medicine for depression, you may be able to get the generic form, which may be less expensive. Some makers of prescription medicines also offer help to patients who cannot afford the medicines they need. Follow these instructions at home:  Get the right amount and quality of sleep.  Cut down on using caffeine, tobacco, alcohol, and other potentially harmful substances.  Try to exercise, such as walking or lifting small weights.  Take over-the-counter and prescription  medicines only as told by your health care provider.  Eat a healthy diet that includes plenty of vegetables, fruits, whole grains, low-fat dairy products, and lean protein. Do not eat a lot of foods that are high in solid fats, added sugars, or salt.  Keep all follow-up visits as told by your health care provider. This is important. Contact a health care provider if:  You stop taking your antidepressant medicines, and you have any of these symptoms: ? Nausea. ? Headache. ? Feeling lightheaded. ? Chills and body aches. ? Not being able to sleep (insomnia).  You or your friends and family think your depression is getting worse. Get help right away if:  You have thoughts of hurting yourself or others. If you ever feel like you may hurt  yourself or others, or have thoughts about taking your own life, get help right away. You can go to your nearest emergency department or call:  Your local emergency services (911 in the [place]  A suicide crisis helpline, such as the [place] at 3080271562. This is open 24-hours a day.  Summary  If you are living with depression, there are ways to help you recover from it and also ways to prevent it from coming back.  Work with your health care team to create a management plan that includes counseling, stress management techniques, and healthy lifestyle habits. This information is not intended to replace advice given to you by your health care provider. Make sure you discuss any questions you have with your health care provider. Document Released: 03/20/2016 Document Revised: 03/20/2016 Document Reviewed: 03/20/2016 Elsevier Interactive Patient Education  [employer].

## 2016-12-07 NOTE — Progress Notes (Signed)
BP 118/68   Pulse 89   Temp 98.5 F (36.9 C) (Oral)   Resp 14   Wt 268 lb 8 oz (121.8 kg)   SpO2 95%   BMI 39.65 kg/m    Subjective:    Patient ID: Duane Hughes, male    DOB: [birth year], 66 y.o.   MRN: [id number]  HPI: Duane Hughes is a 66 y.o. male  Chief Complaint  Patient presents with  . Follow-up  . Hip Pain  . Depression    pristiq not helping  . Medication Refill    HPI Patient is here for f/u He is having hip pain; right hip for weeks, left hip for years He had an xray which OA and enthesophyte Just 4 stairs into the house; going up stairs with the right hip/leg is waht really hurts; left leg hurts when he walks, just in the same spot; aching pain; he thought it was because of the surgery on the knee; going on for years He is using meloxicam and hydrocodone; he asked what is stronger than hydrocodone; the hydrocodone does not help the pain much any more; used to, but not now; balance "sucks"; wonders if tamsulosin, but not the hydrocodone; already tried tramadol; no alcohol with this pain medicine, no tobacco  He gets up early to donate plasma; not as much sleep as before, but not an issue; has OSA and using CPAP every night Taking longer to get to sleep  ------------------------------------------------ CLINICAL DATA:  Right hip pain for several days. No reported injury.  EXAM: DG HIP (WITH OR WITHOUT PELVIS) 2-3V RIGHT  COMPARISON:  09/10/2006 CT abdomen/pelvis.  FINDINGS: No pelvic fracture or diastasis. No right hip fracture or dislocation. No suspicious focal osseous lesions. Minimal osteoarthritis in the weight-bearing portions of both hip joints. Degenerative changes in the visualized lower lumbar spine. Scattered small enthesophytes throughout the bilateral pelvic girdle and at the right greater trochanter.  IMPRESSION: Minimal bilateral hip osteoarthritis. Right greater trochanter and bilateral pelvic girdle enthesophytosis.  Degenerative changes in the visualized lower lumbar spine.   Electronically Signed   By: Ilona Sorrel M.D.   On: 12/04/2016 10:20 --------------------------------------------------  He continues to struggle with depression; he has not really made any headway with the medicine change; this is a recurrent problem; he has not been able to get in to see a psychiatrist or psychologist yet; he denies active SI; has faith (Mormon); uses the scriptures and Book of [employer] for guidance and comfort  Depression screen [facility] 2/9 12/07/2016 11/16/2016 09/19/2016 09/05/2016 08/02/2016  Decreased Interest 3 1 0 0 0  Down, Depressed, Hopeless 3 3 0 1 0  PHQ - 2 Score 6 4 0 1 0  Altered sleeping 1 2 - - -  Tired, decreased energy 3 3 - - -  Change in appetite 0 1 - - -  Feeling bad or failure about yourself  1 1 - - -  Trouble concentrating 1 1 - - -  Moving slowly or fidgety/restless 0 0 - - -  Suicidal thoughts 1 1 - - -  PHQ-9 Score 13 13 - - -  Difficult doing work/chores Somewhat difficult Somewhat difficult - - -    Relevant past medical, surgical, family and social history reviewed Past Medical History:  Diagnosis Date  . Arthritis    back, hands, knees  . Colon polyp 05/09/2016   TUBULAR ADENOMAS   . Depression   . Diabetes mellitus without complication ([place])   .  Ectatic abdominal aorta ([place]) 04/18/2016   2.9 cm on [place] 2016, unchanged 2017  . History of skin cancer    hand  . Morbid obesity ([place]) 11/11/2014  . Obesity 11/11/2014  . Sleep apnea    Past Surgical History:  Procedure Laterality Date  . COLONOSCOPY WITH PROPOFOL N/A 05/09/2016   Procedure: COLONOSCOPY WITH PROPOFOL;  Surgeon: Lucilla Lame, MD;  Location: [facility] ENDOSCOPY;  Service: Endoscopy;  Laterality: N/A;  . HERNIA REPAIR    . KNEE ARTHROSCOPY     several scopes done  . MEDIAL PARTIAL KNEE REPLACEMENT Left    2000s?  Marland Kitchen SKIN CANCER EXCISION  Feb 2017   removed from right hand   Family History  Problem Relation Age of  Onset  . Alcohol abuse Mother   . Rheum arthritis Mother   . Stroke Maternal Grandmother   . Stroke Paternal Grandfather   . Dementia Paternal Grandmother   . Cancer Neg Hx   . Diabetes Neg Hx   . Heart disease Neg Hx   . Hypertension Neg Hx   . Kidney disease Neg Hx   . Prostate cancer Neg Hx    Social History   Social History  . Marital status: Married    Spouse name: N/A  . Number of children: N/A  . Years of education: N/A   Occupational History  . Not on file.   Social History Main Topics  . Smoking status: Never Smoker  . Smokeless tobacco: Never Used  . Alcohol use No  . Drug use: No  . Sexual activity: Yes    Partners: Female   Other Topics Concern  . Not on file   Social History Narrative  . No narrative on file    Interim medical history since last visit reviewed. Allergies and medications reviewed  Review of Systems Per HPI unless specifically indicated above     Objective:    BP 118/68   Pulse 89   Temp 98.5 F (36.9 C) (Oral)   Resp 14   Wt 268 lb 8 oz (121.8 kg)   SpO2 95%   BMI 39.65 kg/m   Wt Readings from Last 3 Encounters:  12/07/16 268 lb 8 oz (121.8 kg)  11/16/16 270 lb 9.6 oz (122.7 kg)  09/19/16 266 lb (120.7 kg)    Physical Exam  Constitutional: He appears well-developed and well-nourished.  Obese; weight down 2 pounds over last 3 weeks  Eyes: No scleral icterus.  Cardiovascular: Normal rate and regular rhythm.   Pulmonary/Chest: Effort normal and breath sounds normal.  Abdominal: He exhibits no distension.  Musculoskeletal: He exhibits no edema.  Neurological: He is alert.  Skin: No pallor.  Psychiatric: He has a normal mood and affect. His behavior is normal. Judgment and thought content normal. His mood appears not anxious. His affect is not blunt. His speech is not delayed, not tangential and not slurred. He is not agitated, not aggressive, not slowed and not withdrawn. Thought content is not paranoid and not  delusional. Cognition and memory are not impaired. He does not express impulsivity or inappropriate judgment. He does not exhibit a depressed mood. He expresses no homicidal and no suicidal ideation.  Good eye contact with examiner; did not appear despondent    Results for orders placed or performed in visit on 11/16/16  TSH  Result Value Ref Range   TSH 1.44 0.40 - 4.50 mIU/L  T4, free  Result Value Ref Range   Free T4 1.0 0.8 - 1.8  ng/dL  B12  Result Value Ref Range   Vitamin B-12 566 200 - 1,100 pg/mL  VITAMIN D 25 Hydroxy (Vit-D Deficiency, Fractures)  Result Value Ref Range   Vit D, 25-Hydroxy 18 (L) 30 - 100 ng/mL  Testosterone  Result Value Ref Range   Testosterone 331 250 - 827 ng/dL  Lipid panel  Result Value Ref Range   Cholesterol 151 <200 mg/dL   Triglycerides 492 (H) <150 mg/dL   HDL 31 (L) >40 mg/dL   Total CHOL/HDL Ratio 4.9 <5.0 Ratio   VLDL NOT CALC <30 mg/dL   LDL Cholesterol NOT CALC <100 mg/dL      Assessment & Plan:   Problem List Items Addressed This Visit      Other   Right hip pain    Refer to orthopaedist      Relevant Orders   Ambulatory referral to Orthopedic Surgery   Major depressive disorder, recurrent ([place])    I personally got on his insurance web site and tried several numbers; we came across several sites where the doctor was not there any longer; finally spoke with staff here, and involved office manager to get patient plugged in to see a psychiatrist ASAP; patient is now going to be seeing specialist; contracted verbally for safety; discussed the case with a colleague; will leave the SNRI alone and add atypical antipsychotic for recalcitrant depression; close f/u with either the psychiatrist or me, but patient obviously won't need to be seeing both of [place]; as long as he sees one of [place] in a couple of weeks; to ER if worse       Other Visit Diagnoses    Chronic left hip pain    -  Primary   Relevant Orders   Ambulatory referral to  Orthopedic Surgery      Follow up plan: No Follow-up on file.  An after-visit summary was printed and given to the patient at [facility].  Please see the patient instructions which may contain other information and recommendations beyond what is mentioned above in the assessment and plan.  Meds ordered this encounter  Medications  . oxyCODONE-acetaminophen (ROXICET) 5-325 MG tablet    Sig: Take 1 tablet by mouth every 6 (six) hours as needed for moderate pain or severe pain.    Dispense:  20 tablet    Refill:  0  . ARIPiprazole (ABILIFY) 5 MG tablet    Sig: Take 1 tablet (5 mg total) by mouth at bedtime.    Dispense:  30 tablet    Refill:  0    Orders Placed This Encounter  Procedures  . Ambulatory referral to Orthopedic Surgery

## 2016-12-11 ENCOUNTER — Encounter: Payer: Self-pay | Admitting: Family Medicine

## 2016-12-13 NOTE — Assessment & Plan Note (Signed)
I personally got on his insurance web site and tried several numbers; we came across several sites where the doctor was not there any longer; finally spoke with staff here, and involved office manager to get patient plugged in to see a psychiatrist ASAP; patient is now going to be seeing specialist; contracted verbally for safety; discussed the case with a colleague; will leave the SNRI alone and add atypical antipsychotic for recalcitrant depression; close f/u with either the psychiatrist or me, but patient obviously won't need to be seeing both of [place]; as long as he sees one of [place] in a couple of weeks; to ER if worse

## 2016-12-21 ENCOUNTER — Ambulatory Visit (INDEPENDENT_AMBULATORY_CARE_PROVIDER_SITE_OTHER): Payer: Medicare HMO | Admitting: General Surgery

## 2016-12-21 ENCOUNTER — Encounter: Payer: Self-pay | Admitting: General Surgery

## 2016-12-21 VITALS — BP 150/78 | HR 91 | Resp 14 | Ht 69.0 in | Wt 277.0 lb

## 2016-12-21 DIAGNOSIS — R591 Generalized enlarged lymph nodes: Secondary | ICD-10-CM

## 2016-12-21 NOTE — Patient Instructions (Addendum)
Patient to return as needed. 

## 2016-12-21 NOTE — Progress Notes (Signed)
Patient ID: Duane Hughes, male   DOB: January 03, 1951, 66 y.o.   MRN: [id number]  Chief Complaint  Patient presents with  . Follow-up    HPI Duane Hughes is a 66 y.o. male here today for his six month follow up right Lymphadenopathy . Patient states he is doing well. No longer feels any mass under his right axilla.  No constitutional symptoms. HPI  Past Medical History:  Diagnosis Date  . Arthritis    back, hands, knees  . Colon polyp 05/09/2016   TUBULAR ADENOMAS   . Depression   . Diabetes mellitus without complication ([place])   . Ectatic abdominal aorta ([place]) 04/18/2016   2.9 cm on [place] 2016, unchanged 2017  . History of skin cancer    hand  . Morbid obesity ([place]) 11/11/2014  . Obesity 11/11/2014  . Sleep apnea     Past Surgical History:  Procedure Laterality Date  . COLONOSCOPY WITH PROPOFOL N/A 05/09/2016   Procedure: COLONOSCOPY WITH PROPOFOL;  Surgeon: Lucilla Lame, MD;  Location: [facility] ENDOSCOPY;  Service: Endoscopy;  Laterality: N/A;  . HERNIA REPAIR    . KNEE ARTHROSCOPY     several scopes done  . MEDIAL PARTIAL KNEE REPLACEMENT Left    2000s?  Marland Kitchen SKIN CANCER EXCISION  Feb 2017   removed from right hand    Family History  Problem Relation Age of Onset  . Alcohol abuse Mother   . Rheum arthritis Mother   . Stroke Maternal Grandmother   . Stroke Paternal Grandfather   . Dementia Paternal Grandmother   . Cancer Neg Hx   . Diabetes Neg Hx   . Heart disease Neg Hx   . Hypertension Neg Hx   . Kidney disease Neg Hx   . Prostate cancer Neg Hx     Social History Social History  Substance Use Topics  . Smoking status: Never Smoker  . Smokeless tobacco: Never Used  . Alcohol use No    No Known Allergies  Current Outpatient Prescriptions  Medication Sig Dispense Refill  . ARIPiprazole (ABILIFY) 5 MG tablet Take 1 tablet (5 mg total) by mouth at bedtime. 30 tablet 0  . aspirin EC 81 MG tablet Take 1 tablet (81 mg total) by mouth daily. 30 tablet 11  .  atorvastatin (LIPITOR) 10 MG tablet TAKE 1 TABLET BY MOUTH EVERY NIGHT AT BEDTIME 30 tablet 6  . buPROPion (WELLBUTRIN SR) 150 MG 12 hr tablet TAKE 1 TABLET BY MOUTH TWICE A DAY 60 tablet 11  . lisinopril (PRINIVIL,ZESTRIL) 5 MG tablet TAKE 1/2 TABLET BY MOUTH DAILY. 45 tablet 1  . magnesium gluconate (MAGONATE) 500 MG tablet Take 500 mg by mouth daily.    . meloxicam (MOBIC) 15 MG tablet Mobic 15 mg tablet  Take 1 tablet every day by oral route.    . metFORMIN (GLUCOPHAGE) 500 MG tablet TAKE 1 TABLET BY MOUTH 2 TIMES DAILY WITH A MEAL. 60 tablet 6  . Multiple Vitamins-Minerals (MULTIVITAMIN ADULT PO) Take 1 tablet by mouth daily.    Marland Kitchen oxyCODONE-acetaminophen (ROXICET) 5-325 MG tablet Take 1 tablet by mouth every 6 (six) hours as needed for moderate pain or severe pain. 20 tablet 0  . tamsulosin (FLOMAX) 0.4 MG CAPS capsule Take 1 capsule (0.4 mg total) by mouth daily. 90 capsule 4  . TURMERIC PO Take by mouth.    . vardenafil (LEVITRA) 20 MG tablet Take 1 tablet 2 hours prior to intercourse on an empty stomach. 18 tablet 3  .  Vitamin D, Ergocalciferol, (DRISDOL) 50000 units CAPS capsule Take 1 capsule (50,000 Units total) by mouth every 7 (seven) days. 4 capsule 1   No current facility-administered medications for this visit.     Review of Systems Review of Systems  Constitutional: Negative.   Respiratory: Negative.   Cardiovascular: Negative.     Blood pressure (!) 150/78, pulse 91, resp. rate 14, height 5\' 9"  (1.753 m), weight 277 lb (125.6 kg).  Physical Exam Physical Exam  Constitutional: He is oriented to person, place, and time. He appears well-developed and well-nourished.  Eyes: Conjunctivae are normal. No scleral icterus.  Neck: Neck supple.  Cardiovascular: Normal rate, regular rhythm and normal heart sounds.   Pulmonary/Chest: Effort normal and breath sounds normal.  Abdominal: Soft.  Lymphadenopathy:    He has no cervical adenopathy.    Axillary adenopathy: small  right axillary node, no palpable left axillary adenopathy.       Right: No inguinal and no supraclavicular adenopathy present.       Left: No inguinal and no supraclavicular adenopathy present.  Neurological: He is alert and oriented to person, place, and time.  Skin: Skin is warm and dry.    Data Reviewed None.  Assessment    Resolving lymphadenopathy.    Plan         Patient to return as needed.  The patient is aware to call back for any questions or concerns.  HPI, Physical Exam, Assessment and Plan have been scribed under the direction and in the presence of Hervey Ard, MD.  Gaspar Cola, CMA  I have completed the exam and reviewed the above documentation for accuracy and completeness.  I agree with the above.  Haematologist has been used and any errors in dictation or transcription are unintentional.  Hervey Ard, M.D., F.A.C.S.   [name] 12/22/2016, 4:20 PM

## 2016-12-25 DIAGNOSIS — M1712 Unilateral primary osteoarthritis, left knee: Secondary | ICD-10-CM | POA: Diagnosis not present

## 2016-12-25 DIAGNOSIS — M170 Bilateral primary osteoarthritis of knee: Secondary | ICD-10-CM | POA: Diagnosis not present

## 2016-12-25 DIAGNOSIS — M7061 Trochanteric bursitis, right hip: Secondary | ICD-10-CM | POA: Diagnosis not present

## 2016-12-25 DIAGNOSIS — M1711 Unilateral primary osteoarthritis, right knee: Secondary | ICD-10-CM | POA: Diagnosis not present

## 2016-12-25 DIAGNOSIS — M7062 Trochanteric bursitis, left hip: Secondary | ICD-10-CM | POA: Diagnosis not present

## 2016-12-25 DIAGNOSIS — R69 Illness, unspecified: Secondary | ICD-10-CM | POA: Diagnosis not present

## 2016-12-29 ENCOUNTER — Other Ambulatory Visit: Payer: Self-pay | Admitting: Family Medicine

## 2016-12-29 MED ORDER — OXYCODONE-ACETAMINOPHEN 5-325 MG PO TABS
1.0000 | ORAL_TABLET | Freq: Four times a day (QID) | ORAL | 0 refills | Status: DC | PRN
Start: 2016-12-29 — End: 2017-01-24

## 2016-12-29 NOTE — Progress Notes (Signed)
Reviewed [place] web site No red flags Rx for next fill printed and signed, ready when needed during my absence in September

## 2017-01-02 ENCOUNTER — Other Ambulatory Visit: Payer: Self-pay | Admitting: Family Medicine

## 2017-01-08 ENCOUNTER — Other Ambulatory Visit: Payer: Self-pay | Admitting: Family Medicine

## 2017-01-08 NOTE — Telephone Encounter (Signed)
Please advise patient - now that he has completed the once weekly large Vitamin D supplement, he can switch to OTC vitamin D 1000mg  daily or 2000mg  every other day (whichever is cheaper). I see that there is no follow up scheduled with Dr. Sanda Klein - please ensure he has a 3-4 month follow up scheduled from his last visit with Dr. Sanda Klein which was 12/07/2016. Thanks!

## 2017-01-08 NOTE — Telephone Encounter (Signed)
Pt has been notified and the vitamin d 50,000 un taking off pt med list since he has competed course of the Vitamin D.

## 2017-01-16 DIAGNOSIS — R69 Illness, unspecified: Secondary | ICD-10-CM | POA: Diagnosis not present

## 2017-01-16 DIAGNOSIS — M1711 Unilateral primary osteoarthritis, right knee: Secondary | ICD-10-CM | POA: Diagnosis not present

## 2017-01-16 DIAGNOSIS — M1712 Unilateral primary osteoarthritis, left knee: Secondary | ICD-10-CM | POA: Diagnosis not present

## 2017-01-16 DIAGNOSIS — M7061 Trochanteric bursitis, right hip: Secondary | ICD-10-CM | POA: Diagnosis not present

## 2017-01-17 NOTE — Telephone Encounter (Signed)
I wrote a prescription for 20 pills of oxycodone before I left on vacation, anticipating he may need a refill before I returned Rx was giiven to patient already per Roselyn Reef

## 2017-01-22 DIAGNOSIS — R69 Illness, unspecified: Secondary | ICD-10-CM | POA: Diagnosis not present

## 2017-01-24 ENCOUNTER — Other Ambulatory Visit: Payer: Self-pay | Admitting: Family Medicine

## 2017-01-24 NOTE — Telephone Encounter (Signed)
Unable to check [facility] web site due to change in log-in policy Will prescribe med No hx of issues, no concern for misuse or diversion in past

## 2017-01-25 ENCOUNTER — Encounter: Payer: Self-pay | Admitting: Family Medicine

## 2017-01-25 NOTE — Telephone Encounter (Signed)
May 2018 Cr and K+ reviewed; Rx approved

## 2017-01-27 ENCOUNTER — Encounter: Payer: Self-pay | Admitting: Family Medicine

## 2017-01-29 ENCOUNTER — Encounter: Payer: Self-pay | Admitting: Family Medicine

## 2017-01-29 ENCOUNTER — Other Ambulatory Visit: Payer: Self-pay | Admitting: Urology

## 2017-01-29 MED ORDER — SILDENAFIL CITRATE 20 MG PO TABS: ORAL_TABLET | ORAL | 3 refills | Status: DC

## 2017-01-29 NOTE — Progress Notes (Signed)
I have sent in a prescription for the sildenafil for Duane Hughes [facility].  Thanks for letting me know :).

## 2017-01-31 ENCOUNTER — Encounter: Payer: Self-pay | Admitting: Urology

## 2017-02-02 ENCOUNTER — Encounter: Payer: Self-pay | Admitting: Urology

## 2017-02-20 ENCOUNTER — Ambulatory Visit: Payer: Medicare HMO | Admitting: Family Medicine

## 2017-02-20 ENCOUNTER — Ambulatory Visit (INDEPENDENT_AMBULATORY_CARE_PROVIDER_SITE_OTHER): Payer: Medicare HMO

## 2017-02-20 VITALS — BP 120/60 | HR 90 | Temp 97.0°F | Resp 16 | Ht 69.0 in | Wt 280.1 lb

## 2017-02-20 DIAGNOSIS — Z0000 Encounter for general adult medical examination without abnormal findings: Secondary | ICD-10-CM

## 2017-02-20 NOTE — Patient Instructions (Signed)
Duane Hughes , Thank you for taking time to come for your Medicare Wellness Visit. I appreciate your ongoing commitment to your health goals. Please review the following plan we discussed and let me know if I can assist you in the future.   Screening recommendations/referrals: Colonoscopy: Completed 05/09/16 Recommended yearly ophthalmology/optometry visit for glaucoma screening and checkup Recommended yearly dental visit for hygiene and checkup  Vaccinations: Influenza vaccine: Declined Pneumococcal vaccine: Completed series Tdap vaccine: Declined Shingles vaccine: Declined. Please provide a copy of your immunization record.    Advanced directives: Please bring a copy of your POA (Power of Attorney) and/or Living Will to your next appointment.   Conditions/risks identified: Fall risk prevention discussed  Next appointment: Scheduled to see Dr. Sanda Klein on 02/20/17 @ 3:00pm. Follow up in one year for your annual wellness exam.  Preventive Care 67 Years and Older, Male Preventive care refers to lifestyle choices and visits with your health care provider that can promote health and wellness. What does preventive care include?  A yearly physical exam. This is also called an annual well check.  Dental exams once or twice a year.  Routine eye exams. Ask your health care provider how often you should have your eyes checked.  Personal lifestyle choices, including:  Daily care of your teeth and gums.  Regular physical activity.  Eating a healthy diet.  Avoiding tobacco and drug use.  Limiting alcohol use.  Practicing safe sex.  Taking low doses of aspirin every day.  Taking vitamin and mineral supplements as recommended by your health care provider. What happens during an annual well check? The services and screenings done by your health care provider during your annual well check will depend on your age, overall health, lifestyle risk factors, and family history of  disease. Counseling  Your health care provider may ask you questions about your:  Alcohol use.  Tobacco use.  Drug use.  Emotional well-being.  Home and relationship well-being.  Sexual activity.  Eating habits.  History of falls.  Memory and ability to understand (cognition).  Work and work Statistician. Screening  You may have the following tests or measurements:  Height, weight, and BMI.  Blood pressure.  Lipid and cholesterol levels. These may be checked every 5 years, or more frequently if you are over 67 years old.  Skin check.  Lung cancer screening. You may have this screening every year starting at age 677 if you have a 30-pack-year history of smoking and currently smoke or have quit within the past 15 years.  Fecal occult blood test (FOBT) of the stool. You may have this test every year starting at age 677.  Flexible sigmoidoscopy or colonoscopy. You may have a sigmoidoscopy every 5 years or a colonoscopy every 10 years starting at age 67.  Prostate cancer screening. Recommendations will vary depending on your family history and other risks.  Hepatitis C blood test.  Hepatitis B blood test.  Sexually transmitted disease (STD) testing.  Diabetes screening. This is done by checking your blood sugar (glucose) after you have not eaten for a while (fasting). You may have this done every 1-3 years.  Abdominal aortic aneurysm (AAA) screening. You may need this if you are a current or former smoker.  Osteoporosis. You may be screened starting at age 677 if you are at high risk. Talk with your health care provider about your test results, treatment options, and if necessary, the need for more tests. Vaccines  Your health care provider may recommend  certain vaccines, such as:  Influenza vaccine. This is recommended every year.  Tetanus, diphtheria, and acellular pertussis (Tdap, Td) vaccine. You may need a Td booster every 10 years.  Zoster vaccine. You may  need this after age 67.  Pneumococcal 13-valent conjugate (PCV13) vaccine. One dose is recommended after age 67.  Pneumococcal polysaccharide (PPSV23) vaccine. One dose is recommended after age 67. Talk to your health care provider about which screenings and vaccines you need and how often you need them. This information is not intended to replace advice given to you by your health care provider. Make sure you discuss any questions you have with your health care provider. Document Released: 05/14/2015 Document Revised: 01/05/2016 Document Reviewed: 02/16/2015 Elsevier Interactive Patient Education  2017 [place] Prevention in the Home Falls can cause injuries. They can happen to people of all ages. There are many things you can do to make your home safe and to help prevent falls. What can I do on the outside of my home?  Regularly fix the edges of walkways and driveways and fix any cracks.  Remove anything that might make you trip as you walk through a door, such as a raised step or threshold.  Trim any bushes or trees on the path to your home.  Use bright outdoor lighting.  Clear any walking paths of anything that might make someone trip, such as rocks or tools.  Regularly check to see if handrails are loose or broken. Make sure that both sides of any steps have handrails.  Any raised decks and porches should have guardrails on the edges.  Have any leaves, snow, or ice cleared regularly.  Use sand or salt on walking paths during winter.  Clean up any spills in your garage right away. This includes oil or grease spills. What can I do in the bathroom?  Use night lights.  Install grab bars by the toilet and in the tub and shower. Do not use towel bars as grab bars.  Use non-skid mats or decals in the tub or shower.  If you need to sit down in the shower, use a plastic, non-slip stool.  Keep the floor dry. Clean up any water that spills on the floor as soon as it  happens.  Remove soap buildup in the tub or shower regularly.  Attach bath mats securely with double-sided non-slip rug tape.  Do not have throw rugs and other things on the floor that can make you trip. What can I do in the bedroom?  Use night lights.  Make sure that you have a light by your bed that is easy to reach.  Do not use any sheets or blankets that are too big for your bed. They should not hang down onto the floor.  Have a firm chair that has side arms. You can use this for support while you get dressed.  Do not have throw rugs and other things on the floor that can make you trip. What can I do in the kitchen?  Clean up any spills right away.  Avoid walking on wet floors.  Keep items that you use a lot in easy-to-reach places.  If you need to reach something above you, use a strong step stool that has a grab bar.  Keep electrical cords out of the way.  Do not use floor polish or wax that makes floors slippery. If you must use wax, use non-skid floor wax.  Do not have throw rugs and other  things on the floor that can make you trip. What can I do with my stairs?  Do not leave any items on the stairs.  Make sure that there are handrails on both sides of the stairs and use them. Fix handrails that are broken or loose. Make sure that handrails are as long as the stairways.  Check any carpeting to make sure that it is firmly attached to the stairs. Fix any carpet that is loose or worn.  Avoid having throw rugs at the top or bottom of the stairs. If you do have throw rugs, attach them to the floor with carpet tape.  Make sure that you have a light switch at the top of the stairs and the bottom of the stairs. If you do not have them, ask someone to add them for you. What else can I do to help prevent falls?  Wear shoes that:  Do not have high heels.  Have rubber bottoms.  Are comfortable and fit you well.  Are closed at the toe. Do not wear sandals.  If you  use a stepladder:  Make sure that it is fully opened. Do not climb a closed stepladder.  Make sure that both sides of the stepladder are locked into place.  Ask someone to hold it for you, if possible.  Clearly mark and make sure that you can see:  Any grab bars or handrails.  First and last steps.  Where the edge of each step is.  Use tools that help you move around (mobility aids) if they are needed. These include:  Canes.  Walkers.  Scooters.  Crutches.  Turn on the lights when you go into a dark area. Replace any light bulbs as soon as they burn out.  Set up your furniture so you have a clear path. Avoid moving your furniture around.  If any of your floors are uneven, fix them.  If there are any pets around you, be aware of where they are.  Review your medicines with your doctor. Some medicines can make you feel dizzy. This can increase your chance of falling. Ask your doctor what other things that you can do to help prevent falls. This information is not intended to replace advice given to you by your health care provider. Make sure you discuss any questions you have with your health care provider. Document Released: 02/11/2009 Document Revised: 09/23/2015 Document Reviewed: 05/22/2014 Elsevier Interactive Patient Education  2017 [employer].

## 2017-02-20 NOTE — Progress Notes (Addendum)
Subjective:   Duane Hughes is a 66 y.o. male who presents for Medicare Annual/Subsequent preventive examination.  Review of Systems:  N/A Cardiac Risk Factors include: advanced age (>6men, >86 women);diabetes mellitus;hypertension;sedentary lifestyle;obesity (BMI >30kg/m2);male gender     Objective:    Vitals: BP 120/60 (BP Location: Left Arm, Patient Position: Sitting, Cuff Size: Large)   Pulse 90   Temp (!) 97 F (36.1 C) (Oral)   Resp 16   Ht 5\' 9"  (1.753 m)   Wt 280 lb 1.6 oz (127.1 kg)   BMI 41.36 kg/m   Body mass index is 41.36 kg/m.  Tobacco History  Smoking Status  . Never Smoker  Smokeless Tobacco  . Never Used     Counseling given: Not Answered   Past Medical History:  Diagnosis Date  . Arthritis    back, hands, knees  . Colon polyp 05/09/2016   TUBULAR ADENOMAS   . Depression   . Diabetes mellitus without complication ([place])   . Ectatic abdominal aorta ([place]) 04/18/2016   2.9 cm on [place] 2016, unchanged 2017  . History of skin cancer    hand  . Morbid obesity ([place]) 11/11/2014  . Obesity 11/11/2014  . Sleep apnea    Past Surgical History:  Procedure Laterality Date  . COLONOSCOPY WITH PROPOFOL N/A 05/09/2016   Procedure: COLONOSCOPY WITH PROPOFOL;  Surgeon: Lucilla Lame, MD;  Location: [facility] ENDOSCOPY;  Service: Endoscopy;  Laterality: N/A;  . HERNIA REPAIR    . KNEE ARTHROSCOPY     several scopes done  . MEDIAL PARTIAL KNEE REPLACEMENT Left    2000s?  Marland Kitchen SKIN CANCER EXCISION  Feb 2017   removed from right hand   Family History  Problem Relation Age of Onset  . Alcohol abuse Mother   . Rheum arthritis Mother   . Stroke Maternal Grandmother   . Stroke Paternal Grandfather   . Dementia Paternal Grandmother   . Cancer Neg Hx   . Diabetes Neg Hx   . Heart disease Neg Hx   . Hypertension Neg Hx   . Kidney disease Neg Hx   . Prostate cancer Neg Hx    History  Sexual Activity  . Sexual activity: Yes  . Partners: Female    Outpatient  Encounter Prescriptions as of 02/20/2017  Medication Sig  . aspirin EC 81 MG tablet Take 1 tablet (81 mg total) by mouth daily.  Marland Kitchen atorvastatin (LIPITOR) 10 MG tablet TAKE 1 TABLET BY MOUTH EVERY NIGHT AT BEDTIME  . buPROPion (WELLBUTRIN SR) 150 MG 12 hr tablet TAKE 1 TABLET BY MOUTH TWICE A DAY  . lamoTRIgine (LAMICTAL) 100 MG tablet Take 100 mg by mouth daily.  Marland Kitchen lisinopril (PRINIVIL,ZESTRIL) 5 MG tablet TAKE 1/2 TABLET BY MOUTH DAILY.  . magnesium gluconate (MAGONATE) 500 MG tablet Take 500 mg by mouth daily.  . meloxicam (MOBIC) 15 MG tablet Mobic 15 mg tablet  Take 1 tablet every day by oral route.  . metFORMIN (GLUCOPHAGE) 500 MG tablet TAKE 1 TABLET BY MOUTH 2 TIMES DAILY WITH A MEAL.  . Multiple Vitamins-Minerals (MULTIVITAMIN ADULT PO) Take 1 tablet by mouth daily.  Marland Kitchen oxyCODONE-acetaminophen (PERCOCET/ROXICET) 5-325 MG tablet TAKE 1 TABLET BY MOUTH EVERY SIX HOURS AS NEEDED MODERATE OR SEVERE PAIN  . sildenafil (REVATIO) 20 MG tablet Take 3 to 5 tablets two hours before intercouse on an empty stomach.  Do not take with nitrates.  . tamsulosin (FLOMAX) 0.4 MG CAPS capsule Take 1 capsule (0.4 mg total) by  mouth daily.  . TURMERIC PO Take by mouth.  . ARIPiprazole (ABILIFY) 5 MG tablet Take 1 tablet (5 mg total) by mouth at bedtime. (Patient not taking: Reported on 02/20/2017)  . vardenafil (LEVITRA) 20 MG tablet Take 1 tablet 2 hours prior to intercourse on an empty stomach. (Patient not taking: Reported on 02/20/2017)   No facility-administered encounter medications on file as of 02/20/2017.     Activities of Daily Living In your present state of health, do you have any difficulty performing the following activities: 02/20/2017 12/07/2016  Hearing? N Y  Vision? N N  Difficulty concentrating or making decisions? N N  Walking or climbing stairs? Y N  Comment joint pain -  Dressing or bathing? N N  Doing errands, shopping? N N  Preparing Food and eating ? N -  Using the Toilet? N -   In the past six months, have you accidently leaked urine? N -  Do you have problems with loss of bowel control? N -  Managing your Medications? N -  Managing your Finances? N -  Housekeeping or managing your Housekeeping? N -  Some recent data might be hidden    Patient Care Team: Lada, Satira Anis, MD as PCP - General (Family Medicine) Ralene Bathe, MD (Dermatology) Laneta Simmers as Physician Assistant (Urology) Bary Castilla Forest Gleason, MD (General Surgery) Latanya Maudlin, MD as Consulting Physician (Orthopedic Surgery)   Assessment:     Exercise Activities and Dietary recommendations Current Exercise Habits: The patient does not participate in regular exercise at present, Exercise limited by: None identified  Goals    None     Fall Risk Fall Risk  02/20/2017 12/07/2016 11/16/2016 09/19/2016 09/05/2016  Falls in the past year? Yes Yes Yes Yes Yes  Comment - - - - -  Number falls in past yr: 1 1 1 2  or more 2 or more  Injury with Fall? No No No No No  Follow up Education provided;Falls prevention discussed - - - -   Depression Screen PHQ 2/9 Scores 02/20/2017 12/07/2016 11/16/2016 09/19/2016  PHQ - 2 Score 0 6 4 0  PHQ- 9 Score - 13 13 -    Cognitive Function: declined        Immunization History  Administered Date(s) Administered  . Influenza, High Dose Seasonal PF 02/24/2016  . Influenza,inj,Quad PF,6+ Mos 03/18/2015  . Pneumococcal Conjugate-13 12/17/2014  . Pneumococcal Polysaccharide-23 04/05/2016  . Zoster 05/01/2009   Screening Tests Health Maintenance  Topic Date Due  . TETANUS/TDAP  11/27/1969  . INFLUENZA VACCINE  11/29/2016  . HEMOGLOBIN A1C  03/22/2017  . OPHTHALMOLOGY EXAM  08/08/2017  . FOOT EXAM  11/16/2017  . COLONOSCOPY  05/09/2021  . Hepatitis C Screening  Completed  . PNA vac Low Risk Adult  Completed      Plan:   I have personally reviewed and addressed the Medicare Annual Wellness questionnaire and have noted the following in  the patient's chart:  A. Medical and social history B. Use of alcohol, tobacco or illicit drugs  C. Current medications and supplements D. Functional ability and status E.  Nutritional status F.  Physical activity G. Advance directives H. List of other physicians I.  Hospitalizations, surgeries, and ER visits in previous 12 months J.  [place] such as hearing and vision if needed, cognitive and depression L. Referrals and appointments - none  In addition, I have reviewed and discussed with patient certain preventive protocols, quality metrics, and best  practice recommendations. A written personalized care plan for preventive services as well as general preventive health recommendations were provided to patient.  See attached scanned questionnaire for additional information.   Signed,  Aleatha Borer, LPN Nurse Health Advisor   LPN Notes: Declined flu and tetanus vaccines today. Discussed code status today. Pt is unable to provide documentation to support his request to be considered DNR. Pt was advised to bring original documents to his next OV. --------------------------------------------------------------- MD Recommendations: goal of weight loss, please discuss with patient; let him know about Shingrix vaccine which he can get at his pharmacy --------------------------------------------------------------- LPN Notes: Pt made aware that he is welcome to receive Shingrix at his local pharmacy of choice. Discussed weight loss goals as follows:  * Exercise at least 4-5 x's per week for at least 30-40 minutes, moderate intensity.  * Eat at least 3 small healthy meals per day with at least 2 healthy snacks in between lunch and dinner.  * Drink at least 8 glasses of water per day

## 2017-02-21 ENCOUNTER — Telehealth: Payer: Self-pay | Admitting: Family Medicine

## 2017-02-21 NOTE — Telephone Encounter (Signed)
Called pt informed him of information below, pt states that he is no longer taking Pristiq so this should not present a problem for him.

## 2017-02-21 NOTE — Telephone Encounter (Signed)
Please let pt know that I received a note from his insurance company His insurance is not going to cover [place] in 2019 We'll want him to discuss this with his psychiatrist and get prescriptions from that provider; we want him to know ahead of time so there won't be a lapse in medication therapy Thank you

## 2017-02-22 NOTE — Telephone Encounter (Signed)
Thank you :)

## 2017-02-28 NOTE — Progress Notes (Signed)
3:43 PM   Duane Hughes 01-10-1951 [id number]  Referring provider: Arnetha Courser, MD [address] [place], [place] [address]  Chief Complaint  Patient presents with  . Erectile Dysfunction  . Benign Prostatic Hypertrophy    HPI: Patient is a 66 year old Caucasian male who presents today for 12 month follow-up for erectile dysfunction and BPH with lower urinary tract symptoms.    Erectile dysfunction His SHIM score is 18, which is mild erectile dysfunction.   His previous SHIM score was 21.  He has been having difficulty with erections for several months. He is able to climax at this time.  His libido is preserved.  His risk factors for ED are age, BPH, DM, HTN, HLD, sleep apnea, depression, antidepressants, pain medication and blood pressure medications. He denies any painful erections or curvatures with his erections.   He has tried PDE5-inhibitors in the past with success and continues them now.       SHIM    Row Name 03/01/17 1503         SHIM: Over the last 6 months:   How do you rate your confidence that you could get and keep an erection? Very Low     When you had erections with sexual stimulation, how often were your erections hard enough for penetration (entering your partner)? Almost Always or Always     During sexual intercourse, how often were you able to maintain your erection after you had penetrated (entered) your partner? Most Times (much more than half the time)     During sexual intercourse, how difficult was it to maintain your erection to completion of intercourse? Slightly Difficult     When you attempted sexual intercourse, how often was it satisfactory for you? Most Times (much more than half the time)       SHIM Total Score   SHIM 18        Score: 1-7 Severe ED 8-11 Moderate ED 12-16 Mild-Moderate ED 17-21 Mild ED 22-25 No ED  BPH WITH LUTS His IPSS score today is 4, which is mild lower urinary tract symptomatology. He is  pleased with his quality life due to his urinary symptoms.  His previous IPSS score was 3/2.  His previous PVR is 15 mL.    His urinary complaints consist of mild urinary leakage. He denies any dysuria, hematuria or suprapubic pain.  He currently taking tamsulosin 0.4 mg.  He also denies any recent fevers, chills, nausea or vomiting.  He does not have a family history of PCa.  He is having balance issue at this time and is wondering if his tamsulosin is contributing factor.  He is not having dizziness.     IPSS    Row Name 03/01/17 1500         International Prostate Symptom Score   How often have you had the sensation of not emptying your bladder? Less than 1 in 5     How often have you had to urinate less than every two hours? Not at All     How often have you found you stopped and started again several times when you urinated? Not at All     How often have you found it difficult to postpone urination? Less than 1 in 5 times     How often have you had a weak urinary stream? Less than 1 in 5 times     How often have you had to strain to start  urination? Not at All     How many times did you typically get up at night to urinate? 1 Time     Total IPSS Score 4       Quality of Life due to urinary symptoms   If you were to spend the rest of your life with your urinary condition just the way it is now how would you feel about that? Pleased        Score:  1-7 Mild 8-19 Moderate 20-35 Severe   PMH: Past Medical History:  Diagnosis Date  . Arthritis    back, hands, knees  . Colon polyp 05/09/2016   TUBULAR ADENOMAS   . Depression   . Diabetes mellitus without complication ([facility])   . Ectatic abdominal aorta ([place]) 04/18/2016   2.9 cm on [place] 2016, unchanged 2017  . History of skin cancer    hand  . Morbid obesity ([facility]) 11/11/2014  . Obesity 11/11/2014  . Sleep apnea     Surgical History: Past Surgical History:  Procedure Laterality Date  . COLONOSCOPY WITH PROPOFOL N/A  05/09/2016   Procedure: COLONOSCOPY WITH PROPOFOL;  Surgeon: Lucilla Lame, MD;  Location: [facility] ENDOSCOPY;  Service: Endoscopy;  Laterality: N/A;  . HERNIA REPAIR    . KNEE ARTHROSCOPY     several scopes done  . MEDIAL PARTIAL KNEE REPLACEMENT Left    2000s?  Marland Kitchen SKIN CANCER EXCISION  Feb 2017   removed from right hand    Home Medications:  Allergies as of 03/01/2017   No Known Allergies     Medication List       Accurate as of 03/01/17  3:43 PM. Always use your most recent med list.          aspirin EC 81 MG tablet Take 1 tablet (81 mg total) by mouth daily.   atorvastatin 10 MG tablet Commonly known as:  LIPITOR TAKE 1 TABLET BY MOUTH EVERY NIGHT AT BEDTIME   buPROPion 150 MG 12 hr tablet Commonly known as:  WELLBUTRIN SR TAKE 1 TABLET BY MOUTH TWICE A DAY   lamoTRIgine 100 MG tablet Commonly known as:  LAMICTAL Take 100 mg by mouth daily.   lisinopril 5 MG tablet Commonly known as:  PRINIVIL,ZESTRIL TAKE 1/2 TABLET BY MOUTH DAILY.   magnesium gluconate 500 MG tablet Commonly known as:  MAGONATE Take 500 mg by mouth daily.   metFORMIN 500 MG tablet Commonly known as:  GLUCOPHAGE TAKE 1 TABLET BY MOUTH 2 TIMES DAILY WITH A MEAL.   MOBIC 15 MG tablet Generic drug:  meloxicam Mobic 15 mg tablet  Take 1 tablet every day by oral route.   MULTIVITAMIN ADULT PO Take 1 tablet by mouth daily.   oxyCODONE-acetaminophen 5-325 MG tablet Commonly known as:  PERCOCET/ROXICET TAKE 1 TABLET BY MOUTH EVERY SIX HOURS AS NEEDED MODERATE OR SEVERE PAIN   sildenafil 20 MG tablet Commonly known as:  REVATIO Take 3 to 5 tablets two hours before intercouse on an empty stomach.  Do not take with nitrates.   tamsulosin 0.4 MG Caps capsule Commonly known as:  FLOMAX Take 1 capsule (0.4 mg total) by mouth daily.   TURMERIC PO Take 1 capsule by mouth daily.       Allergies: No Known Allergies  Family History: Family History  Problem Relation Age of Onset  . Alcohol abuse  Mother   . Rheum arthritis Mother   . Stroke Maternal Grandmother   . Stroke Paternal Grandfather   . Dementia  Paternal Grandmother   . Cancer Neg Hx   . Diabetes Neg Hx   . Heart disease Neg Hx   . Hypertension Neg Hx   . Kidney disease Neg Hx   . Prostate cancer Neg Hx     Social History:  reports that he has never smoked. He has never used smokeless tobacco. He reports that he does not drink alcohol or use drugs.  ROS: UROLOGY Frequent Urination?: No Hard to postpone urination?: No Burning/pain with urination?: No Get up at night to urinate?: No Leakage of urine?: No Urine stream starts and stops?: No Trouble starting stream?: No Do you have to strain to urinate?: No Blood in urine?: No Urinary tract infection?: No Sexually transmitted disease?: No Injury to kidneys or bladder?: No Painful intercourse?: No Weak stream?: No Erection problems?: No Penile pain?: No  Gastrointestinal Nausea?: No Vomiting?: No Indigestion/heartburn?: No Diarrhea?: No Constipation?: No  Constitutional Fever: No Night sweats?: No Weight loss?: No Fatigue?: Yes  Skin Skin rash/lesions?: No Itching?: No  Eyes Blurred vision?: No Double vision?: No  Ears/Nose/Throat Sore throat?: No Sinus problems?: No  Hematologic/Lymphatic Swollen glands?: No Easy bruising?: Yes  Cardiovascular Leg swelling?: No Chest pain?: No  Respiratory Cough?: No Shortness of breath?: No  Endocrine Excessive thirst?: No  Musculoskeletal Back pain?: No Joint pain?: Yes  Neurological Headaches?: No Dizziness?: Yes  Psychologic Depression?: Yes Anxiety?: No  Physical Exam: BP (!) 160/89   Pulse 79   Temp 99.1 F (37.3 C) (Oral)   Ht 5\' 9"  (1.753 m)   Wt 275 lb 12.8 oz (125.1 kg)   BMI 40.73 kg/m   Constitutional: Well nourished. Alert and oriented, No acute distress. HEENT: [place] AT, moist mucus membranes. Trachea midline, no masses. Cardiovascular: No clubbing, cyanosis, or  edema. Respiratory: Normal respiratory effort, no increased work of breathing. GI: Abdomen is soft, non tender, non distended, no abdominal masses. Liver and spleen not palpable.  No hernias appreciated.  Stool sample for occult testing is not indicated.   GU: No CVA tenderness.  No bladder fullness or masses.  Patient with circumcised phallus.  Urethral meatus is patent.  No penile discharge. No penile lesions or rashes. Scrotum without lesions, cysts, rashes and/or edema.  Testicles are located scrotally bilaterally. No masses are appreciated in the testicles. Left and right epididymis are normal. Rectal: Patient with  normal sphincter tone. Anus and perineum without scarring or rashes. No rectal masses are appreciated. Prostate is approximately 55 grams, no nodules are appreciated. Seminal vesicles are normal. Skin: No rashes, bruises or suspicious lesions. Lymph: No cervical or inguinal adenopathy. Neurologic: Grossly intact, no focal deficits, moving all 4 extremities. Psychiatric: Normal mood and affect.  Laboratory Data: Lab Results  Component Value Date   WBC 7.4 06/20/2016   HGB 14.8 06/20/2016   HCT 43.6 06/20/2016   MCV 93 06/20/2016   PLT 260 06/20/2016    Lab Results  Component Value Date   CREATININE 1.01 09/19/2016    PSA History  0.8 ng/mL on 11/12/2014  0.8 ng/mL on 03/19/2015  1.0 ng/mL on 02/28/2016    Lab Results  Component Value Date   HGBA1C 6.4 (H) 09/19/2016    Lab Results  Component Value Date   TSH 1.44 11/16/2016    Lab Results  Component Value Date   AST 33 09/19/2016   Lab Results  Component Value Date   ALT 34 09/19/2016   I have reviewed the labs.  Assessment & Plan:  1. Erectile dysfunction  - SHIM score is 18, it is worsening   - having success with PDE5i  - RTC in 12 months for repeat SHIM score and exam   2. BPH with LUTS  - IPSS score is 4/1, it is worse  - Continue conservative management, avoiding bladder irritants  and timed voiding's  - Will discontinue the tamsulosin at this time to see if it is contributing to his balance issue, if it is contributing to his balance issue we will have him discontinue it permanently and prescribed Cialis 5 mg daily to see if this will help control his urinary symptoms and nocturia due to balance issues  - RTC in 12 months for IPSS, PSA and exam - if PSA is stable  Return in about 1 year (around 03/01/2018) for IPSS, SHIM, PSA and exam.  These notes generated with voice recognition software. I apologize for typographical errors.  Zara Council, [facility] [address] [place], [place] [address] [phone]

## 2017-03-01 ENCOUNTER — Ambulatory Visit (INDEPENDENT_AMBULATORY_CARE_PROVIDER_SITE_OTHER): Payer: Medicare HMO | Admitting: Urology

## 2017-03-01 ENCOUNTER — Encounter: Payer: Self-pay | Admitting: Urology

## 2017-03-01 VITALS — BP 160/89 | HR 79 | Temp 99.1°F | Ht 69.0 in | Wt 275.8 lb

## 2017-03-01 DIAGNOSIS — N138 Other obstructive and reflux uropathy: Secondary | ICD-10-CM

## 2017-03-01 DIAGNOSIS — N529 Male erectile dysfunction, unspecified: Secondary | ICD-10-CM | POA: Diagnosis not present

## 2017-03-01 DIAGNOSIS — N401 Enlarged prostate with lower urinary tract symptoms: Secondary | ICD-10-CM | POA: Diagnosis not present

## 2017-03-02 ENCOUNTER — Encounter: Payer: Self-pay | Admitting: Urology

## 2017-03-02 ENCOUNTER — Telehealth: Payer: Self-pay

## 2017-03-02 DIAGNOSIS — R69 Illness, unspecified: Secondary | ICD-10-CM | POA: Diagnosis not present

## 2017-03-02 LAB — PSA: PROSTATE SPECIFIC AG, SERUM: 0.2 ng/mL (ref 0.0–4.0)

## 2017-03-02 NOTE — Telephone Encounter (Signed)
-----   Message from Nori Riis, PA-C sent at 03/02/2017  8:02 AM EDT ----- Please let Mr. Macgregor know that his PSA is normal.

## 2017-03-02 NOTE — Telephone Encounter (Signed)
Called pt. Gave results. Pt verbalized understanding.

## 2017-03-05 ENCOUNTER — Other Ambulatory Visit: Payer: Self-pay | Admitting: Family Medicine

## 2017-03-05 NOTE — Telephone Encounter (Signed)
We have two pharmacies listed; please ask pt which one he uses and update Rx ready for pick-up; we'll see him later this week for appt

## 2017-03-05 NOTE — Telephone Encounter (Signed)
Left detailed voicemial 

## 2017-03-06 DIAGNOSIS — R69 Illness, unspecified: Secondary | ICD-10-CM | POA: Diagnosis not present

## 2017-03-08 ENCOUNTER — Encounter: Payer: Self-pay | Admitting: Family Medicine

## 2017-03-08 ENCOUNTER — Ambulatory Visit (INDEPENDENT_AMBULATORY_CARE_PROVIDER_SITE_OTHER): Payer: Medicare HMO | Admitting: Family Medicine

## 2017-03-08 VITALS — BP 116/60 | HR 95 | Temp 98.3°F | Resp 14 | Wt 276.2 lb

## 2017-03-08 DIAGNOSIS — I493 Ventricular premature depolarization: Secondary | ICD-10-CM

## 2017-03-08 DIAGNOSIS — E785 Hyperlipidemia, unspecified: Secondary | ICD-10-CM | POA: Diagnosis not present

## 2017-03-08 DIAGNOSIS — E119 Type 2 diabetes mellitus without complications: Secondary | ICD-10-CM

## 2017-03-08 DIAGNOSIS — E559 Vitamin D deficiency, unspecified: Secondary | ICD-10-CM | POA: Diagnosis not present

## 2017-03-08 DIAGNOSIS — I517 Cardiomegaly: Secondary | ICD-10-CM | POA: Diagnosis not present

## 2017-03-08 DIAGNOSIS — E781 Pure hyperglyceridemia: Secondary | ICD-10-CM | POA: Diagnosis not present

## 2017-03-08 DIAGNOSIS — Z01818 Encounter for other preprocedural examination: Secondary | ICD-10-CM | POA: Diagnosis not present

## 2017-03-08 LAB — COMPLETE METABOLIC PANEL WITH GFR
AG Ratio: 1.9 (calc) (ref 1.0–2.5)
ALBUMIN MSPROF: 3.7 g/dL (ref 3.6–5.1)
ALT: 39 U/L (ref 9–46)
AST: 33 U/L (ref 10–35)
Alkaline phosphatase (APISO): 47 U/L (ref 40–115)
BILIRUBIN TOTAL: 0.3 mg/dL (ref 0.2–1.2)
BUN: 16 mg/dL (ref 7–25)
CALCIUM: 8.5 mg/dL — AB (ref 8.6–10.3)
CO2: 26 mmol/L (ref 20–32)
CREATININE: 0.85 mg/dL (ref 0.70–1.25)
Chloride: 105 mmol/L (ref 98–110)
GFR, EST AFRICAN AMERICAN: 104 mL/min/{1.73_m2} (ref >=60)
GFR, EST NON AFRICAN AMERICAN: 90 mL/min/{1.73_m2} (ref >=60)
GLUCOSE: 176 mg/dL — AB (ref 65–139)
Globulin: 2 g/dL (calc) (ref 1.9–3.7)
Potassium: 4.3 mmol/L (ref 3.5–5.3)
Sodium: 138 mmol/L (ref 135–146)
TOTAL PROTEIN: 5.7 g/dL — AB (ref 6.1–8.1)

## 2017-03-08 LAB — MAGNESIUM: MAGNESIUM: 2.2 mg/dL (ref 1.5–2.5)

## 2017-03-08 LAB — POCT GLYCOSYLATED HEMOGLOBIN (HGB A1C): Hemoglobin A1C: 6.6

## 2017-03-08 NOTE — Progress Notes (Signed)
BP 116/60   Pulse 95   Temp 98.3 F (36.8 C) (Oral)   Resp 14   Wt 276 lb 3.2 oz (125.3 kg)   SpO2 97%   BMI 40.79 kg/m    Subjective:    Patient ID: Duane Hughes, male    DOB: 11-23-1950, 66 y.o.   MRN: [id number]  HPI: Duane Hughes is a 66 y.o. male  Chief Complaint  Patient presents with  . 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; already had partial replacement left knee, 2008 ish No problems with anesthesia in the past, no problems with wound healing  Type 2 diabetes; not much dry mouth; no blurred vision No nocturia  No hx of MRSA No cough in the last month No burning with urination Cuts more easily Will be going home for rehab after surgery Shortest way to get into the house is four steps Wife will help him at hoome  Depression screen [facility] 2/9 03/08/2017 02/20/2017 12/07/2016 11/16/2016 09/19/2016  Decreased Interest 2 0 3 1 0  Down, Depressed, Hopeless 2 0 3 3 0  PHQ - 2 Score 4 0 6 4 0  Altered sleeping 0 - 1 2 -  Tired, decreased energy 3 - 3 3 -  Change in appetite 0 - 0 1 -  Feeling bad or failure about yourself  0 - 1 1 -  Trouble concentrating - - 1 1 -  Moving slowly or fidgety/restless 0 - 0 0 -  Suicidal thoughts 0 - 1 1 -  PHQ-9 Score 7 - 13 13 -  Difficult doing work/chores Not difficult at all - Somewhat difficult Somewhat difficult -    Relevant past medical, surgical, family and social history reviewed Past Medical History:  Diagnosis Date  . Arthritis    back, hands, knees  . Colon polyp 05/09/2016   TUBULAR ADENOMAS   . Depression   . Diabetes mellitus without complication ([place])   . Ectatic abdominal aorta ([place]) 04/18/2016   2.9 cm on [place] 2016, unchanged 2017  . History of skin cancer    hand  . Morbid obesity ([place]) 11/11/2014  . Obesity 11/11/2014  . Sleep apnea    Past Surgical History:  Procedure Laterality  Date  . HERNIA REPAIR    . KNEE ARTHROSCOPY     several scopes done  . MEDIAL PARTIAL KNEE REPLACEMENT Left    2000s?  Marland Kitchen SKIN CANCER EXCISION  Feb 2017   removed from right hand   Family History  Problem Relation Age of Onset  . Alcohol abuse Mother   . Rheum arthritis Mother   . Stroke Maternal Grandmother   . Stroke Paternal Grandfather   . Dementia Paternal Grandmother   . Cancer Neg Hx   . Diabetes Neg Hx   . Heart disease Neg Hx   . Hypertension Neg Hx   . Kidney disease Neg Hx   . Prostate cancer Neg Hx    Social History   Socioeconomic History  . Marital status: Married    Spouse name: Not on file  . Number of children: Not on file  . Years of education: Not on file  . Highest education level: Not on file  Social Needs  . Financial resource strain: Not on file  . Food insecurity - worry: Not on file  . Food insecurity - inability: Not  on file  . Transportation needs - medical: Not on file  . Transportation needs - non-medical: Not on file  Occupational History  . Not on file  Tobacco Use  . Smoking status: Never Smoker  . Smokeless tobacco: Never Used  Substance and Sexual Activity  . Alcohol use: No    Alcohol/week: 0.0 oz  . Drug use: No  . Sexual activity: Yes    Partners: Female  Other Topics Concern  . Not on file  Social History Narrative  . Not on file    Interim medical history since last visit reviewed. Allergies and medications reviewed  Review of Systems  HENT: Negative for nosebleeds.   Respiratory: Positive for shortness of breath (just with exertion).   Cardiovascular: Negative for chest pain.  Gastrointestinal: Negative for blood in stool.  Genitourinary: Negative for hematuria.  Musculoskeletal:       Bilateral knee pain  Hematological: Does not bruise/bleed easily.   Per HPI unless specifically indicated above     Objective:    BP 116/60   Pulse 95   Temp 98.3 F (36.8 C) (Oral)   Resp 14   Wt 276 lb 3.2 oz (125.3  kg)   SpO2 97%   BMI 40.79 kg/m   Wt Readings from Last 3 Encounters:  03/08/17 276 lb 3.2 oz (125.3 kg)  03/01/17 275 lb 12.8 oz (125.1 kg)  02/20/17 280 lb 1.6 oz (127.1 kg)    Physical Exam  Constitutional: He appears well-developed and well-nourished.  Obese  HENT:  Head: Normocephalic and atraumatic.  Mouth/Throat: No oropharyngeal exudate.  Eyes: No scleral icterus.  Neck: No JVD present.  Cardiovascular: Normal rate and regular rhythm.  Pulmonary/Chest: Effort normal and breath sounds normal.  Abdominal: Soft. Bowel sounds are normal. He exhibits no distension. There is no tenderness.  Musculoskeletal: He exhibits no edema.       Right knee: He exhibits decreased range of motion and deformity (enlarged).  Neurological: He is alert.  Skin: No pallor.  Psychiatric: He has a normal mood and affect. His behavior is normal. Judgment and thought content normal.   Diabetic Foot Form - Detailed   Diabetic Foot Exam - detailed Diabetic Foot exam was performed with the following findings:  Yes 03/08/2017  3:10 PM  Pulse Foot Exam completed.:  Yes  Right Dorsalis Pedis:  Present Left Dorsalis Pedis:  Present  Sensory Foot Exam Completed.:  Yes Semmes-Weinstein Monofilament Test R Site 1-Great Toe:  Pos L Site 1-Great Toe:  Pos    Comments:  Little bit of scale left food > right foot        Assessment & Plan:   Problem List Items Addressed This Visit      Cardiovascular and Mediastinum   Right atrial enlargement    Suspect valvular regurg; order ECHO      Relevant Orders   ECHOCARDIOGRAM COMPLETE   LAE (left atrial enlargement)    Likely from mitral regurg; will get ECHO prior to surgery      Relevant Orders   ECHOCARDIOGRAM COMPLETE     Endocrine   Diabetes mellitus without complication (HCC)    Check A1c; foot exam by MD      Relevant Orders   POCT HgB A1C (Completed)   COMPLETE METABOLIC PANEL WITH GFR (Completed)     Other   Hypertriglyceridemia    Relevant Orders   Lipid panel   Vitamin D deficiency    Recheck and supplement if needed  Dyslipidemia    On statin; check lipids      Relevant Orders   Lipid panel    Other Visit Diagnoses    Pre-op evaluation    -  Primary   EKG done, suggested LAA and RAA; will get echo; additional labs; expect to clear for surgery   Relevant Orders   EKG 12-Lead   POCT HgB A1C (Completed)   ECHOCARDIOGRAM COMPLETE   PVC's (premature ventricular contractions)       Relevant Orders   Magnesium (Completed)      Follow up plan: No Follow-up on file.  An after-visit summary was printed and given to the patient at [facility].  Please see the patient instructions which may contain other information and recommendations beyond what is mentioned above in the assessment and plan.  No orders of the defined types were placed in this encounter.   Orders Placed This Encounter  Procedures  . Lipid panel  . Magnesium  . COMPLETE METABOLIC PANEL WITH GFR  . POCT HgB A1C  . EKG 12-Lead  . ECHOCARDIOGRAM COMPLETE

## 2017-03-08 NOTE — Assessment & Plan Note (Signed)
Suspect valvular regurg; order ECHO

## 2017-03-08 NOTE — Assessment & Plan Note (Signed)
Likely from mitral regurg; will get ECHO prior to surgery

## 2017-03-08 NOTE — Assessment & Plan Note (Signed)
Recheck and supplement if needed

## 2017-03-08 NOTE — Assessment & Plan Note (Signed)
Check A1c; foot exam by MD 

## 2017-03-08 NOTE — Patient Instructions (Addendum)
Please follow the directions for holding medicines prior to surgery I am going to recommend no energy drinks Limit caffeine to help prevent skipped heart beats After surgery, please only take what he prescribes for you Any post-operative pain should only be treated with the pain medicine the orthopaedist gives you Do not take any of my prescribed pain medicine once you have surgery until he releases you to stop his medicine and go back to my medicine The short story: only take pain medicine from one provider at a time We'll get labs today and contact you about the results

## 2017-03-08 NOTE — Assessment & Plan Note (Signed)
On statin; check lipids

## 2017-03-09 DIAGNOSIS — I493 Ventricular premature depolarization: Secondary | ICD-10-CM | POA: Diagnosis not present

## 2017-03-09 DIAGNOSIS — E781 Pure hyperglyceridemia: Secondary | ICD-10-CM | POA: Diagnosis not present

## 2017-03-09 DIAGNOSIS — E119 Type 2 diabetes mellitus without complications: Secondary | ICD-10-CM | POA: Diagnosis not present

## 2017-03-09 DIAGNOSIS — E785 Hyperlipidemia, unspecified: Secondary | ICD-10-CM | POA: Diagnosis not present

## 2017-03-09 LAB — LIPID PANEL
CHOL/HDL RATIO: 3.6 (calc) (ref <5.0)
CHOLESTEROL: 135 mg/dL (ref <200)
HDL: 37 mg/dL — ABNORMAL LOW (ref >40)
LDL Cholesterol (Calc): 74 mg/dL (calc)
Non-HDL Cholesterol (Calc): 98 mg/dL (calc) (ref <130)
Triglycerides: 159 mg/dL — ABNORMAL HIGH (ref <150)

## 2017-03-27 ENCOUNTER — Other Ambulatory Visit: Payer: Self-pay | Admitting: Family Medicine

## 2017-03-27 NOTE — Telephone Encounter (Signed)
Patient notified

## 2017-03-27 NOTE — Telephone Encounter (Signed)
Reviewed [facility] web site Surgery planned for 04/04/2017 Ortho will write next script, note to pharmacy on Rx Rx ready for pick-up

## 2017-03-28 ENCOUNTER — Encounter (HOSPITAL_COMMUNITY): Payer: Self-pay

## 2017-03-28 NOTE — Patient Instructions (Signed)
FLAY GHOSH  03/28/2017   Your procedure is scheduled on: 04-04-17  Report to [facility] Main  Entrance Take [place]  elevators to 3rd floor to  [place] at    815 AM.    Call this number if you have problems the morning of surgery [phone]    Remember: ONLY 1 PERSON MAY GO WITH YOU TO SHORT STAY TO GET  READY MORNING OF [place].  Do not eat food or drink liquids :After Midnight.     Take these medicines the morning of surgery with A SIP OF WATER: Lamictal, wellbutrin DO NOT TAKE ANY DIABETIC MEDICATIONS DAY OF YOUR SURGERY                               You may not have any metal on your body including hair pins and              piercings  Do not wear jewelry,  lotions, powders or perfumes, deodorant                       Men may shave face and neck.   Do not bring valuables to the hospital. [place].  Contacts, dentures or bridgework may not be worn into surgery.  Leave suitcase in the car. After surgery it may be brought to your room.                Please read over the following fact sheets you were given: _____________________________________________________________________           [facility] - Preparing for Surgery Before surgery, you can play an important role.  Because skin is not sterile, your skin needs to be as free of germs as possible.  You can reduce the number of germs on your skin by washing with CHG (chlorahexidine gluconate) soap before surgery.  CHG is an antiseptic cleaner which kills germs and bonds with the skin to continue killing germs even after washing. Please DO NOT use if you have an allergy to CHG or antibacterial soaps.  If your skin becomes reddened/irritated stop using the CHG and inform your nurse when you arrive at Short Stay. Do not shave (including legs and underarms) for at least 48 hours prior to the first CHG shower.  You may shave your  face/neck. Please follow these instructions carefully:  1.  Shower with CHG Soap the night before surgery and the  morning of Surgery.  2.  If you choose to wash your hair, wash your hair first as usual with your  normal  shampoo.  3.  After you shampoo, rinse your hair and body thoroughly to remove the  shampoo.                           4.  Use CHG as you would any other liquid soap.  You can apply chg directly  to the skin and wash                       Gently with a scrungie or clean washcloth.  5.  Apply the CHG Soap to your body ONLY FROM THE  NECK DOWN.   Do not use on face/ open                           Wound or open sores. Avoid contact with eyes, ears mouth and genitals (private parts).                       Wash face,  Genitals (private parts) with your normal soap.             6.  Wash thoroughly, paying special attention to the area where your surgery  will be performed.  7.  Thoroughly rinse your body with warm water from the neck down.  8.  DO NOT shower/wash with your normal soap after using and rinsing off  the CHG Soap.                9.  Pat yourself dry with a clean towel.            10.  Wear clean pajamas.            11.  Place clean sheets on your bed the night of your first shower and do not  sleep with pets. Day of Surgery : Do not apply any lotions/deodorants the morning of surgery.  Please wear clean clothes to the hospital/surgery center.  FAILURE TO FOLLOW THESE INSTRUCTIONS MAY RESULT IN THE CANCELLATION OF YOUR SURGERY PATIENT SIGNATURE_________________________________  NURSE SIGNATURE__________________________________  ________________________________________________________________________  WHAT IS A BLOOD TRANSFUSION? Blood Transfusion Information  A transfusion is the replacement of blood or some of its parts. Blood is made up of multiple cells which provide different functions.  Red blood cells carry oxygen and are used for blood loss  replacement.  White blood cells fight against infection.  Platelets control bleeding.  Plasma helps clot blood.  Other blood products are available for specialized needs, such as hemophilia or other clotting disorders. BEFORE THE TRANSFUSION  Who gives blood for transfusions?   Healthy volunteers who are fully evaluated to make sure their blood is safe. This is blood bank blood. Transfusion therapy is the safest it has ever been in the practice of medicine. Before blood is taken from a donor, a complete history is taken to make sure that person has no history of diseases nor engages in risky social behavior (examples are intravenous drug use or sexual activity with multiple partners). The donor's travel history is screened to minimize risk of transmitting infections, such as malaria. The donated blood is tested for signs of infectious diseases, such as HIV and hepatitis. The blood is then tested to be sure it is compatible with you in order to minimize the chance of a transfusion reaction. If you or a relative donates blood, this is often done in anticipation of surgery and is not appropriate for emergency situations. It takes many days to process the donated blood. RISKS AND COMPLICATIONS Although transfusion therapy is very safe and saves many lives, the main dangers of transfusion include:   Getting an infectious disease.  Developing a transfusion reaction. This is an allergic reaction to something in the blood you were given. Every precaution is taken to prevent this. The decision to have a blood transfusion has been considered carefully by your caregiver before blood is given. Blood is not given unless the benefits outweigh the risks. AFTER THE TRANSFUSION  Right after receiving a blood transfusion, you will usually feel much better and more  energetic. This is especially true if your red blood cells have gotten low (anemic). The transfusion raises the level of the red blood cells which  carry oxygen, and this usually causes an energy increase.  The nurse administering the transfusion will monitor you carefully for complications. HOME CARE INSTRUCTIONS  No special instructions are needed after a transfusion. You may find your energy is better. Speak with your caregiver about any limitations on activity for underlying diseases you may have. SEEK MEDICAL CARE IF:   Your condition is not improving after your transfusion.  You develop redness or irritation at the intravenous (IV) site. SEEK IMMEDIATE MEDICAL CARE IF:  Any of the following symptoms occur over the next 12 hours:  Shaking chills.  You have a temperature by mouth above 102 F (38.9 C), not controlled by medicine.  Chest, back, or muscle pain.  People around you feel you are not acting correctly or are confused.  Shortness of breath or difficulty breathing.  Dizziness and fainting.  You get a rash or develop hives.  You have a decrease in urine output.  Your urine turns a dark color or changes to pink, red, or brown. Any of the following symptoms occur over the next 10 days:  You have a temperature by mouth above 102 F (38.9 C), not controlled by medicine.  Shortness of breath.  Weakness after normal activity.  The white part of the eye turns yellow (jaundice).  You have a decrease in the amount of urine or are urinating less often.  Your urine turns a dark color or changes to pink, red, or brown. Document Released: 04/14/2000 Document Revised: 07/10/2011 Document Reviewed: 12/02/2007 ExitCare Patient Information 2014 [place].  _______________________________________________________________________  Incentive Spirometer  An incentive spirometer is a tool that can help keep your lungs clear and active. This tool measures how well you are filling your lungs with each breath. Taking long deep breaths may help reverse or decrease the chance of developing breathing (pulmonary) problems  (especially infection) following:  A long period of time when you are unable to move or be active. BEFORE THE PROCEDURE   If the spirometer includes an indicator to show your best effort, your nurse or respiratory therapist will set it to a desired goal.  If possible, sit up straight or lean slightly forward. Try not to slouch.  Hold the incentive spirometer in an upright position. INSTRUCTIONS FOR USE  1. Sit on the edge of your bed if possible, or sit up as far as you can in bed or on a chair. 2. Hold the incentive spirometer in an upright position. 3. Breathe out normally. 4. Place the mouthpiece in your mouth and seal your lips tightly around it. 5. Breathe in slowly and as deeply as possible, raising the piston or the ball toward the top of the column. 6. Hold your breath for 3-5 seconds or for as long as possible. Allow the piston or ball to fall to the bottom of the column. 7. Remove the mouthpiece from your mouth and breathe out normally. 8. Rest for a few seconds and repeat Steps 1 through 7 at least 10 times every 1-2 hours when you are awake. Take your time and take a few normal breaths between deep breaths. 9. The spirometer may include an indicator to show your best effort. Use the indicator as a goal to work toward during each repetition. 10. After each set of 10 deep breaths, practice coughing to be sure your lungs are  clear. If you have an incision (the cut made at the time of surgery), support your incision when coughing by placing a pillow or rolled up towels firmly against it. Once you are able to get out of bed, walk around indoors and cough well. You may stop using the incentive spirometer when instructed by your caregiver.  RISKS AND COMPLICATIONS  Take your time so you do not get dizzy or light-headed.  If you are in pain, you may need to take or ask for pain medication before doing incentive spirometry. It is harder to take a deep breath if you are having  pain. AFTER USE  Rest and breathe slowly and easily.  It can be helpful to keep track of a log of your progress. Your caregiver can provide you with a simple table to help with this. If you are using the spirometer at home, follow these instructions: [place] IF:   You are having difficultly using the spirometer.  You have trouble using the spirometer as often as instructed.  Your pain medication is not giving enough relief while using the spirometer.  You develop fever of 100.5 F (38.1 C) or higher. SEEK IMMEDIATE MEDICAL CARE IF:   You cough up bloody sputum that had not been present before.  You develop fever of 102 F (38.9 C) or greater.  You develop worsening pain at or near the incision site. MAKE SURE YOU:   Understand these instructions.  Will watch your condition.  Will get help right away if you are not doing well or get worse. Document Released: 08/28/2006 Document Revised: 07/10/2011 Document Reviewed: 10/29/2006 [facility] Patient Information 2014 [place], [place].   ________________________________________________________________________

## 2017-03-28 NOTE — Progress Notes (Signed)
ekg 03-08-17 epic  hgba1c 03-08-17 and cmp 03-08-17 epic  Clearance Dr. Sanda Klein on chart   cxr 06-20-16 epic

## 2017-03-29 ENCOUNTER — Encounter (HOSPITAL_COMMUNITY): Payer: Self-pay

## 2017-03-29 ENCOUNTER — Encounter (HOSPITAL_COMMUNITY)
Admission: RE | Admit: 2017-03-29 | Discharge: 2017-03-29 | Disposition: A | Discharge status: Home / Self Care | Payer: Medicare HMO | Source: Ambulatory Visit | Attending: Orthopedic Surgery | Admitting: Orthopedic Surgery

## 2017-03-29 ENCOUNTER — Other Ambulatory Visit: Payer: Self-pay

## 2017-03-29 DIAGNOSIS — M1711 Unilateral primary osteoarthritis, right knee: Secondary | ICD-10-CM | POA: Diagnosis not present

## 2017-03-29 DIAGNOSIS — Z01812 Encounter for preprocedural laboratory examination: Secondary | ICD-10-CM | POA: Insufficient documentation

## 2017-03-29 HISTORY — DX: Anxiety disorder, unspecified: F41.9

## 2017-03-29 HISTORY — DX: Essential (primary) hypertension: I10

## 2017-03-29 LAB — CBC WITH DIFFERENTIAL/PLATELET
Basophils Absolute: 0 10*3/uL (ref 0.0–0.1)
Basophils Relative: 1 %
Eosinophils Absolute: 0.3 10*3/uL (ref 0.0–0.7)
Eosinophils Relative: 4 %
HCT: 44.1 % (ref 39.0–52.0)
Hemoglobin: 14.8 g/dL (ref 13.0–17.0)
Lymphocytes Relative: 23 %
Lymphs Abs: 1.4 10*3/uL (ref 0.7–4.0)
MCH: 32.5 pg (ref 26.0–34.0)
MCHC: 33.6 g/dL (ref 30.0–36.0)
MCV: 96.7 fL (ref 78.0–100.0)
Monocytes Absolute: 0.4 10*3/uL (ref 0.1–1.0)
Monocytes Relative: 7 %
Neutro Abs: 3.9 10*3/uL (ref 1.7–7.7)
Neutrophils Relative %: 65 %
Platelets: 257 10*3/uL (ref 150–400)
RBC: 4.56 MIL/uL (ref 4.22–5.81)
RDW: 12.4 % (ref 11.5–15.5)
WBC: 6 10*3/uL (ref 4.0–10.5)

## 2017-03-29 LAB — PROTIME-INR
INR: 0.98
Prothrombin Time: 12.9 seconds (ref 11.4–15.2)

## 2017-03-29 LAB — SURGICAL PCR SCREEN
MRSA, PCR: NEGATIVE
Staphylococcus aureus: NEGATIVE

## 2017-03-29 LAB — URINALYSIS, ROUTINE W REFLEX MICROSCOPIC
Bilirubin Urine: NEGATIVE
Glucose, UA: NEGATIVE mg/dL
Hgb urine dipstick: NEGATIVE
Ketones, ur: NEGATIVE mg/dL
Leukocytes, UA: NEGATIVE
Nitrite: NEGATIVE
Protein, ur: NEGATIVE mg/dL
Specific Gravity, Urine: 1.03 (ref 1.005–1.030)
pH: 5 (ref 5.0–8.0)

## 2017-03-29 LAB — ABO/RH: ABO/RH(D): O POS

## 2017-03-29 LAB — APTT: aPTT: 29 seconds (ref 24–36)

## 2017-03-30 DIAGNOSIS — R69 Illness, unspecified: Secondary | ICD-10-CM | POA: Diagnosis not present

## 2017-03-31 DIAGNOSIS — E119 Type 2 diabetes mellitus without complications: Secondary | ICD-10-CM | POA: Diagnosis not present

## 2017-03-31 DIAGNOSIS — H9193 Unspecified hearing loss, bilateral: Secondary | ICD-10-CM | POA: Diagnosis not present

## 2017-03-31 DIAGNOSIS — E785 Hyperlipidemia, unspecified: Secondary | ICD-10-CM | POA: Diagnosis not present

## 2017-03-31 DIAGNOSIS — M545 Low back pain: Secondary | ICD-10-CM | POA: Diagnosis not present

## 2017-03-31 DIAGNOSIS — R69 Illness, unspecified: Secondary | ICD-10-CM | POA: Diagnosis not present

## 2017-03-31 DIAGNOSIS — M130 Polyarthritis, unspecified: Secondary | ICD-10-CM | POA: Diagnosis not present

## 2017-03-31 DIAGNOSIS — Z0000 Encounter for general adult medical examination without abnormal findings: Secondary | ICD-10-CM | POA: Diagnosis not present

## 2017-03-31 DIAGNOSIS — G4730 Sleep apnea, unspecified: Secondary | ICD-10-CM | POA: Diagnosis not present

## 2017-03-31 DIAGNOSIS — E559 Vitamin D deficiency, unspecified: Secondary | ICD-10-CM | POA: Diagnosis not present

## 2017-04-03 MED ORDER — DEXTROSE 5 % IV SOLN
3.0000 g | INTRAVENOUS | Status: AC
  Administered 2017-04-04: 3 g via INTRAVENOUS
  Filled 2017-04-03: qty 3

## 2017-04-03 NOTE — H&P (Signed)
TOTAL KNEE ADMISSION H&P  Patient is being admitted for right total knee arthroplasty.  Subjective:  Chief Complaint:right knee pain.  HPI: Duane Hughes, 66 y.o. male, has a history of pain and functional disability in the right knee due to arthritis and has failed non-surgical conservative treatments for greater than 12 weeks to includeNSAID's and/or analgesics, corticosteriod injections and activity modification.  Onset of symptoms was gradual, starting >10 years ago with gradually worsening course since that time. The patient noted prior procedures on the knee to include  arthroscopy and menisectomy on the right knee(s).  Patient currently rates pain in the right knee(s) at 8 out of 10 with activity. Patient has night pain, worsening of pain with activity and weight bearing, pain that interferes with activities of daily living, pain with passive range of motion, crepitus and joint swelling.  Patient has evidence of periarticular osteophytes and joint space narrowing by imaging studies. There is no active infection.  Patient Active Problem List   Diagnosis Date Noted  . LAE (left atrial enlargement) 03/08/2017  . Right atrial enlargement 03/08/2017  . Right hip pain 12/04/2016  . Vitamin D deficiency 11/21/2016  . Physical symptoms without known medical cause 11/21/2016  . Hx of hypogonadism 11/17/2016  . Right foot pain 09/05/2016  . Lymphadenopathy 06/20/2016  . Lymphadenopathy, axillary 06/06/2016  . Mass of right axilla 05/22/2016  . Benign neoplasm of ascending colon   . Ectatic abdominal aorta ([place]) 04/18/2016  . Bilateral low back pain with bilateral sciatica 03/26/2016  . 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 Erectile dysfunction of organic origin 09/05/2015  . Dyslipidemia 06/22/2015  . Bruxism (teeth grinding) 04/15/2015  . Diabetes mellitus without complication ([place]) 29/56/2130  . Medication monitoring encounter 03/19/2015  . Needs flu shot  03/19/2015  . Knee pain, bilateral 03/18/2015  . Obstructive sleep apnea 03/18/2015  . Controlled substance agreement signed 03/18/2015  . Preventative health care 12/19/2014  . Special screening for malignant neoplasms, colon 12/19/2014  . Need for prophylactic vaccination against Streptococcus pneumoniae (pneumococcus) 12/19/2014  . Colon cancer screening 12/17/2014  . Hypertriglyceridemia 11/17/2014  . Drug-induced erectile dysfunction 11/13/2014  . Morbid obesity ([place]) 11/11/2014  . Major depressive disorder, recurrent ([place]) 11/11/2014  . Benign hypertension 11/11/2014  . Enlarged prostate 11/11/2014  . Skin exam, screening for cancer 11/11/2014   Past Medical History:  Diagnosis Date  . Anxiety   . Arthritis    back, hands, knees  . Colon polyp 05/09/2016   TUBULAR ADENOMAS   . Depression   . Diabetes mellitus without complication ([place])    type 2  . Ectatic abdominal aorta ([place]) 04/18/2016   2.9 cm on [place] 2016, unchanged 2017  . History of skin cancer    hand  . Hypertension   . Morbid obesity ([place]) 11/11/2014  . Obesity 11/11/2014  . Sleep apnea    cpap    Past Surgical History:  Procedure Laterality Date  . COLONOSCOPY WITH PROPOFOL N/A 05/09/2016   Procedure: COLONOSCOPY WITH PROPOFOL;  Surgeon: Lucilla Lame, MD;  Location: [facility] ENDOSCOPY;  Service: Endoscopy;  Laterality: N/A;  . HERNIA REPAIR    . JOINT REPLACEMENT     04-04-17 Dr. Gladstone Lighter   Right knee  . KNEE ARTHROSCOPY     several scopes done  . MEDIAL PARTIAL KNEE REPLACEMENT Left    2000s?  Marland Kitchen SKIN CANCER EXCISION  Feb 2017   removed from right hand    Current Facility-Administered Medications  Medication  Dose Route Frequency Provider Last Rate Last Dose  . [START ON 04/04/2017] ceFAZolin (ANCEF) 3 g in dextrose 5 % 50 mL IVPB  3 g Intravenous On Call to [place], MD       Current Outpatient Medications  Medication Sig Dispense Refill Last Dose  . aspirin EC 81 MG tablet Take 1 tablet (81 mg  total) by mouth daily. 30 tablet 11 Taking  . atorvastatin (LIPITOR) 10 MG tablet TAKE 1 TABLET BY MOUTH EVERY NIGHT AT BEDTIME (Patient taking differently: TAKE 10 MG BY MOUTH EVERY NIGHT AT BEDTIME) 30 tablet 6 Taking  . buPROPion (WELLBUTRIN SR) 150 MG 12 hr tablet TAKE 1 TABLET BY MOUTH TWICE A DAY (Patient taking differently: TAKE 150 MG BY MOUTH TWICE A DAY) 60 tablet 11 Taking  . Cholecalciferol (EQL VITAMIN D3) 2000 units CAPS Take 2,000 Units by mouth every other day.     . lamoTRIgine (LAMICTAL) 150 MG tablet Take 150 mg by mouth daily.    Taking  . lisinopril (PRINIVIL,ZESTRIL) 5 MG tablet TAKE 1/2 TABLET BY MOUTH DAILY. (Patient taking differently: TAKE 2.5 MG BY MOUTH DAILY.) 45 tablet 1 Taking  . magnesium oxide (MAG-OX) 400 MG tablet Take 400 mg by mouth daily.     . meloxicam (MOBIC) 15 MG tablet Take 15 mg by mouth once daily   Taking  . metFORMIN (GLUCOPHAGE) 500 MG tablet TAKE 1 TABLET BY MOUTH 2 TIMES DAILY WITH A MEAL. (Patient taking differently: TAKE 500 MG BY MOUTH 2 TIMES DAILY WITH A MEAL.) 60 tablet 6 Taking  . Multiple Vitamins-Minerals (MULTIVITAMIN ADULT PO) Take 1 tablet by mouth daily.   Taking  . sildenafil (REVATIO) 20 MG tablet Take 3 to 5 tablets two hours before intercouse on an empty stomach.  Do not take with nitrates. (Patient taking differently: Take 60-100 mg by mouth as needed (for intercourse). Do not take with nitrates.) 50 tablet 3 Taking  . Turmeric 500 MG CAPS Take 500 mg by mouth daily.    Taking  . oxyCODONE-acetaminophen (PERCOCET/ROXICET) 5-325 MG tablet TAKE 1 TABLET BY MOUTH EVERY SIX HOURS AS NEEDED FOR SEVERE PAIN 20 tablet 0   . tamsulosin (FLOMAX) 0.4 MG CAPS capsule Take 1 capsule (0.4 mg total) by mouth daily. (Patient not taking: Reported on 03/08/2017) 90 capsule 4 Not Taking at Unknown time   No Known Allergies  Social History   Tobacco Use  . Smoking status: Never Smoker  . Smokeless tobacco: Never Used  Substance Use Topics  .  Alcohol use: No    Alcohol/week: 0.0 oz    Family History  Problem Relation Age of Onset  . Alcohol abuse Mother   . Rheum arthritis Mother   . Stroke Maternal Grandmother   . Stroke Paternal Grandfather   . Dementia Paternal Grandmother   . Cancer Neg Hx   . Diabetes Neg Hx   . Heart disease Neg Hx   . Hypertension Neg Hx   . Kidney disease Neg Hx   . Prostate cancer Neg Hx      Review of Systems  Constitutional: Negative.   HENT: Negative.   Eyes: Negative.   Respiratory: Positive for shortness of breath. Negative for cough, hemoptysis, sputum production and wheezing.        SOB with exertion  Cardiovascular: Positive for orthopnea and leg swelling. Negative for chest pain, palpitations, claudication and PND.  Gastrointestinal: Negative.   Genitourinary: Negative.   Musculoskeletal: Positive for joint pain and myalgias.  Negative for back pain and falls.  Skin: Negative.   Neurological: Positive for tingling. Negative for dizziness, tremors, sensory change, speech change, focal weakness, seizures, loss of consciousness and headaches.  Endo/Heme/Allergies: Negative.   Psychiatric/Behavioral: Positive for depression. Negative for hallucinations, memory loss, substance abuse and suicidal ideas. The patient is not nervous/anxious and does not have insomnia.     Objective:  Physical Exam  Constitutional: He is oriented to person, place, and time. He appears well-developed. No distress.  Morbidly obese  HENT:  Head: Normocephalic and atraumatic.  Right Ear: External ear normal.  Left Ear: External ear normal.  Nose: Nose normal.  Mouth/Throat: Oropharynx is clear and moist.  Eyes: Conjunctivae and EOM are normal.  Neck: Normal range of motion. Neck supple.  Cardiovascular: Normal rate, regular rhythm, normal heart sounds and intact distal pulses.  No murmur heard. Respiratory: Effort normal and breath sounds normal. No respiratory distress. He has no wheezes.  GI: Soft.  Bowel sounds are normal. He exhibits no distension. There is no tenderness.  Musculoskeletal:       Right hip: Normal.       Left hip: Normal.       Right knee: He exhibits decreased range of motion and swelling. He exhibits no effusion and no erythema. Tenderness found. Medial joint line and lateral joint line tenderness noted.       Left knee: Normal.  Neurological: He is alert and oriented to person, place, and time. He has normal strength and normal reflexes. No sensory deficit.  Skin: No rash noted. He is not diaphoretic. No erythema.  Psychiatric: He has a normal mood and affect. His behavior is normal.    Vitals  Weight: 275 lb Height: 68in Body Surface Area: 2.34 m Body Mass Index: 41.81 kg/m  Pulse: 76 (Regular)  BP: 130/75 (Sitting, Left Arm, Standard)  Imaging Review Plain radiographs demonstrate severe degenerative joint disease of the right knee(s). The overall alignment ismild varus. The bone quality appears to be good for age and reported activity level.  Assessment/Plan:  End stage primary osteoarthritis, right knee   The patient history, physical examination, clinical judgment of the provider and imaging studies are consistent with end stage degenerative joint disease of the right knee(s) and total knee arthroplasty is deemed medically necessary. The treatment options including medical management, injection therapy arthroscopy and arthroplasty were discussed at length. The risks and benefits of total knee arthroplasty were presented and reviewed. The risks due to aseptic loosening, infection, stiffness, patella tracking problems, thromboembolic complications and other imponderables were discussed. The patient acknowledged the explanation, agreed to proceed with the plan and consent was signed. Patient is being admitted for inpatient treatment for surgery, pain control, PT, OT, prophylactic antibiotics, VTE prophylaxis, progressive ambulation and ADL's and discharge  planning. The patient is planning to be discharged home with home health services    PCP: Dr. Enid Derry Home with wife DME: may need walker and 3-n-1 Therapy Plans: HHPT then outpatient therapy at [facility] Other: no anesthesia concerns   Ardeen Jourdain, PA-C

## 2017-04-04 ENCOUNTER — Inpatient Hospital Stay (HOSPITAL_COMMUNITY): Payer: Medicare HMO | Admitting: Registered Nurse

## 2017-04-04 ENCOUNTER — Other Ambulatory Visit: Payer: Self-pay

## 2017-04-04 ENCOUNTER — Inpatient Hospital Stay (HOSPITAL_COMMUNITY)
Admission: RE | Admit: 2017-04-04 | Discharge: 2017-04-06 | DRG: 470 | Disposition: A | Discharge status: Home Health | Payer: Medicare HMO | Source: Ambulatory Visit | Attending: Orthopedic Surgery | Admitting: Orthopedic Surgery

## 2017-04-04 ENCOUNTER — Encounter (HOSPITAL_COMMUNITY): Payer: Self-pay

## 2017-04-04 ENCOUNTER — Encounter (HOSPITAL_COMMUNITY)
Admission: RE | Disposition: A | Discharge status: Home Health | Payer: Self-pay | Source: Ambulatory Visit | Attending: Orthopedic Surgery

## 2017-04-04 DIAGNOSIS — Z791 Long term (current) use of non-steroidal anti-inflammatories (NSAID): Secondary | ICD-10-CM | POA: Diagnosis not present

## 2017-04-04 DIAGNOSIS — Z8261 Family history of arthritis: Secondary | ICD-10-CM

## 2017-04-04 DIAGNOSIS — I119 Hypertensive heart disease without heart failure: Secondary | ICD-10-CM | POA: Diagnosis present

## 2017-04-04 DIAGNOSIS — M479 Spondylosis, unspecified: Secondary | ICD-10-CM | POA: Diagnosis present

## 2017-04-04 DIAGNOSIS — M1711 Unilateral primary osteoarthritis, right knee: Principal | ICD-10-CM | POA: Diagnosis present

## 2017-04-04 DIAGNOSIS — E781 Pure hyperglyceridemia: Secondary | ICD-10-CM | POA: Diagnosis not present

## 2017-04-04 DIAGNOSIS — E785 Hyperlipidemia, unspecified: Secondary | ICD-10-CM | POA: Diagnosis not present

## 2017-04-04 DIAGNOSIS — Z7982 Long term (current) use of aspirin: Secondary | ICD-10-CM

## 2017-04-04 DIAGNOSIS — M24561 Contracture, right knee: Secondary | ICD-10-CM | POA: Diagnosis not present

## 2017-04-04 DIAGNOSIS — Z6841 Body Mass Index (BMI) 40.0 and over, adult: Secondary | ICD-10-CM | POA: Diagnosis not present

## 2017-04-04 DIAGNOSIS — M19041 Primary osteoarthritis, right hand: Secondary | ICD-10-CM | POA: Diagnosis present

## 2017-04-04 DIAGNOSIS — M19042 Primary osteoarthritis, left hand: Secondary | ICD-10-CM | POA: Diagnosis present

## 2017-04-04 DIAGNOSIS — Z96652 Presence of left artificial knee joint: Secondary | ICD-10-CM | POA: Diagnosis present

## 2017-04-04 DIAGNOSIS — G8918 Other acute postprocedural pain: Secondary | ICD-10-CM | POA: Diagnosis not present

## 2017-04-04 DIAGNOSIS — Z7984 Long term (current) use of oral hypoglycemic drugs: Secondary | ICD-10-CM

## 2017-04-04 DIAGNOSIS — F329 Major depressive disorder, single episode, unspecified: Secondary | ICD-10-CM | POA: Diagnosis present

## 2017-04-04 DIAGNOSIS — I10 Essential (primary) hypertension: Secondary | ICD-10-CM | POA: Diagnosis not present

## 2017-04-04 DIAGNOSIS — R69 Illness, unspecified: Secondary | ICD-10-CM | POA: Diagnosis not present

## 2017-04-04 DIAGNOSIS — Z85828 Personal history of other malignant neoplasm of skin: Secondary | ICD-10-CM

## 2017-04-04 DIAGNOSIS — Z96651 Presence of right artificial knee joint: Secondary | ICD-10-CM

## 2017-04-04 DIAGNOSIS — G4733 Obstructive sleep apnea (adult) (pediatric): Secondary | ICD-10-CM | POA: Diagnosis present

## 2017-04-04 DIAGNOSIS — E119 Type 2 diabetes mellitus without complications: Secondary | ICD-10-CM

## 2017-04-04 DIAGNOSIS — E1151 Type 2 diabetes mellitus with diabetic peripheral angiopathy without gangrene: Secondary | ICD-10-CM | POA: Diagnosis not present

## 2017-04-04 DIAGNOSIS — F419 Anxiety disorder, unspecified: Secondary | ICD-10-CM | POA: Diagnosis present

## 2017-04-04 DIAGNOSIS — M25561 Pain in right knee: Secondary | ICD-10-CM | POA: Diagnosis present

## 2017-04-04 HISTORY — PX: TOTAL KNEE ARTHROPLASTY: SHX125

## 2017-04-04 LAB — GLUCOSE, CAPILLARY
GLUCOSE-CAPILLARY: 135 mg/dL — AB (ref 65–99)
GLUCOSE-CAPILLARY: 171 mg/dL — AB (ref 65–99)
Glucose-Capillary: 109 mg/dL — ABNORMAL HIGH (ref 65–99)
Glucose-Capillary: 171 mg/dL — ABNORMAL HIGH (ref 65–99)

## 2017-04-04 LAB — TYPE AND SCREEN
ABO/RH(D): O POS
Antibody Screen: NEGATIVE

## 2017-04-04 SURGERY — ARTHROPLASTY, KNEE, TOTAL
Anesthesia: Spinal | Site: Knee | Laterality: Right

## 2017-04-04 MED ORDER — ACETAMINOPHEN 325 MG PO TABS: 650.0000 mg | ORAL_TABLET | ORAL | Status: DC | PRN

## 2017-04-04 MED ORDER — HYDROMORPHONE HCL 1 MG/ML IJ SOLN: 0.2500 mg | INTRAMUSCULAR | Status: DC | PRN

## 2017-04-04 MED ORDER — BUPIVACAINE LIPOSOME 1.3 % IJ SUSP
INTRAMUSCULAR | Status: DC | PRN
  Administered 2017-04-04: 20 mL

## 2017-04-04 MED ORDER — RIVAROXABAN 10 MG PO TABS
10.0000 mg | ORAL_TABLET | Freq: Every day | ORAL | Status: DC
  Administered 2017-04-05 – 2017-04-06 (×2): 10 mg via ORAL
  Filled 2017-04-04 (×2): qty 1

## 2017-04-04 MED ORDER — LISINOPRIL 2.5 MG PO TABS
2.5000 mg | ORAL_TABLET | Freq: Every day | ORAL | Status: DC
  Administered 2017-04-04: 2.5 mg via ORAL
  Filled 2017-04-04 (×3): qty 1

## 2017-04-04 MED ORDER — FENTANYL CITRATE (PF) 100 MCG/2ML IJ SOLN
INTRAMUSCULAR | Status: AC
  Filled 2017-04-04: qty 2

## 2017-04-04 MED ORDER — MENTHOL 3 MG MT LOZG: 1.0000 | LOZENGE | OROMUCOSAL | Status: DC | PRN

## 2017-04-04 MED ORDER — LACTATED RINGERS IV SOLN
INTRAVENOUS | Status: DC
  Administered 2017-04-04: 23:00:00 via INTRAVENOUS

## 2017-04-04 MED ORDER — SODIUM CHLORIDE 0.9 % IR SOLN
Status: DC | PRN
  Administered 2017-04-04: 1000 mL

## 2017-04-04 MED ORDER — MEPERIDINE HCL 50 MG/ML IJ SOLN: 6.2500 mg | INTRAMUSCULAR | Status: DC | PRN

## 2017-04-04 MED ORDER — BUPIVACAINE IN DEXTROSE 0.75-8.25 % IT SOLN
INTRATHECAL | Status: DC | PRN
  Administered 2017-04-04: 2 mL via INTRATHECAL

## 2017-04-04 MED ORDER — ONDANSETRON HCL 4 MG/2ML IJ SOLN
INTRAMUSCULAR | Status: AC
  Filled 2017-04-04: qty 2

## 2017-04-04 MED ORDER — POLYETHYLENE GLYCOL 3350 17 G PO PACK: 17.0000 g | PACK | Freq: Every day | ORAL | Status: DC | PRN

## 2017-04-04 MED ORDER — BUPIVACAINE LIPOSOME 1.3 % IJ SUSP
20.0000 mL | Freq: Once | INTRAMUSCULAR | Status: DC
  Filled 2017-04-04: qty 20

## 2017-04-04 MED ORDER — ACETAMINOPHEN 650 MG RE SUPP: 650.0000 mg | RECTAL | Status: DC | PRN

## 2017-04-04 MED ORDER — STERILE WATER FOR IRRIGATION IR SOLN
Status: DC | PRN
  Administered 2017-04-04: 2000 mL

## 2017-04-04 MED ORDER — ONDANSETRON HCL 4 MG/2ML IJ SOLN: 4.0000 mg | Freq: Four times a day (QID) | INTRAMUSCULAR | Status: DC | PRN

## 2017-04-04 MED ORDER — HYDROMORPHONE HCL 1 MG/ML IJ SOLN
0.5000 mg | INTRAMUSCULAR | Status: DC | PRN
  Administered 2017-04-04: 0.5 mg via INTRAVENOUS
  Filled 2017-04-04: qty 0.5

## 2017-04-04 MED ORDER — SODIUM CHLORIDE 0.9 % IJ SOLN
INTRAMUSCULAR | Status: DC | PRN
  Administered 2017-04-04: 20 mL

## 2017-04-04 MED ORDER — FENTANYL CITRATE (PF) 100 MCG/2ML IJ SOLN
INTRAMUSCULAR | Status: AC
Start: 2017-04-04 — End: 2017-04-04
  Administered 2017-04-04: 50 ug via INTRAVENOUS
  Filled 2017-04-04: qty 2

## 2017-04-04 MED ORDER — PROPOFOL 10 MG/ML IV BOLUS
INTRAVENOUS | Status: AC
  Filled 2017-04-04: qty 20

## 2017-04-04 MED ORDER — LACTATED RINGERS IV SOLN: INTRAVENOUS | Status: DC

## 2017-04-04 MED ORDER — EPHEDRINE 5 MG/ML INJ
INTRAVENOUS | Status: AC
  Filled 2017-04-04: qty 10

## 2017-04-04 MED ORDER — SODIUM CHLORIDE 0.9 % IJ SOLN
INTRAMUSCULAR | Status: AC
  Filled 2017-04-04: qty 20

## 2017-04-04 MED ORDER — PROPOFOL 10 MG/ML IV BOLUS
INTRAVENOUS | Status: AC
  Filled 2017-04-04: qty 40

## 2017-04-04 MED ORDER — CELECOXIB 200 MG PO CAPS
200.0000 mg | ORAL_CAPSULE | Freq: Two times a day (BID) | ORAL | Status: DC
  Administered 2017-04-04 – 2017-04-06 (×4): 200 mg via ORAL
  Filled 2017-04-04 (×4): qty 1

## 2017-04-04 MED ORDER — SODIUM CHLORIDE 0.9 % IV SOLN
INTRAVENOUS | Status: AC
  Filled 2017-04-04: qty 500000

## 2017-04-04 MED ORDER — DEXAMETHASONE SODIUM PHOSPHATE 10 MG/ML IJ SOLN
INTRAMUSCULAR | Status: DC | PRN
  Administered 2017-04-04: 10 mg via INTRAVENOUS

## 2017-04-04 MED ORDER — SILDENAFIL CITRATE 20 MG PO TABS: 60.0000 mg | ORAL_TABLET | ORAL | Status: DC | PRN

## 2017-04-04 MED ORDER — CHLORHEXIDINE GLUCONATE 4 % EX LIQD: 60.0000 mL | Freq: Once | CUTANEOUS | Status: DC

## 2017-04-04 MED ORDER — LIDOCAINE 2% (20 MG/ML) 5 ML SYRINGE
INTRAMUSCULAR | Status: AC
  Filled 2017-04-04: qty 5

## 2017-04-04 MED ORDER — TAMSULOSIN HCL 0.4 MG PO CAPS
0.4000 mg | ORAL_CAPSULE | Freq: Every day | ORAL | Status: DC
  Administered 2017-04-04 – 2017-04-06 (×3): 0.4 mg via ORAL
  Filled 2017-04-04 (×3): qty 1

## 2017-04-04 MED ORDER — MIDAZOLAM HCL 2 MG/2ML IJ SOLN
INTRAMUSCULAR | Status: AC
  Administered 2017-04-04: 2 mg via INTRAVENOUS
  Filled 2017-04-04: qty 2

## 2017-04-04 MED ORDER — FENTANYL CITRATE (PF) 100 MCG/2ML IJ SOLN
50.0000 ug | INTRAMUSCULAR | Status: DC
  Administered 2017-04-04: 50 ug via INTRAVENOUS

## 2017-04-04 MED ORDER — ONDANSETRON HCL 4 MG/2ML IJ SOLN
INTRAMUSCULAR | Status: DC | PRN
  Administered 2017-04-04: 4 mg via INTRAVENOUS

## 2017-04-04 MED ORDER — ONDANSETRON HCL 4 MG PO TABS: 4.0000 mg | ORAL_TABLET | Freq: Four times a day (QID) | ORAL | Status: DC | PRN

## 2017-04-04 MED ORDER — TRANEXAMIC ACID 1000 MG/10ML IV SOLN
1000.0000 mg | INTRAVENOUS | Status: AC
  Administered 2017-04-04: 1000 mg via INTRAVENOUS
  Filled 2017-04-04: qty 1100

## 2017-04-04 MED ORDER — CEFAZOLIN SODIUM-DEXTROSE 1-4 GM/50ML-% IV SOLN
1.0000 g | Freq: Four times a day (QID) | INTRAVENOUS | Status: AC
  Administered 2017-04-04 (×2): 1 g via INTRAVENOUS
  Filled 2017-04-04 (×2): qty 50

## 2017-04-04 MED ORDER — INSULIN ASPART 100 UNIT/ML ~~LOC~~ SOLN
0.0000 [IU] | Freq: Three times a day (TID) | SUBCUTANEOUS | Status: DC
  Administered 2017-04-04: 2 [IU] via SUBCUTANEOUS
  Administered 2017-04-05 – 2017-04-06 (×3): 1 [IU] via SUBCUTANEOUS

## 2017-04-04 MED ORDER — BUPROPION HCL ER (SR) 150 MG PO TB12
150.0000 mg | ORAL_TABLET | Freq: Two times a day (BID) | ORAL | Status: DC
  Administered 2017-04-04 – 2017-04-06 (×4): 150 mg via ORAL
  Filled 2017-04-04 (×4): qty 1

## 2017-04-04 MED ORDER — LACTATED RINGERS IV SOLN
INTRAVENOUS | Status: DC
  Administered 2017-04-04: 09:00:00 via INTRAVENOUS

## 2017-04-04 MED ORDER — PHENOL 1.4 % MT LIQD: 1.0000 | OROMUCOSAL | Status: DC | PRN

## 2017-04-04 MED ORDER — PROPOFOL 500 MG/50ML IV EMUL
INTRAVENOUS | Status: DC | PRN
  Administered 2017-04-04: 20 ug/kg/min via INTRAVENOUS

## 2017-04-04 MED ORDER — SODIUM CHLORIDE 0.9 % IV SOLN
INTRAVENOUS | Status: DC | PRN
  Administered 2017-04-04: 500 mL

## 2017-04-04 MED ORDER — ALUM & MAG HYDROXIDE-SIMETH 200-200-20 MG/5ML PO SUSP: 30.0000 mL | ORAL | Status: DC | PRN

## 2017-04-04 MED ORDER — HEMOSTATIC AGENTS (NO CHARGE) OPTIME
TOPICAL | Status: DC | PRN
  Administered 2017-04-04: 1 via TOPICAL

## 2017-04-04 MED ORDER — PROMETHAZINE HCL 25 MG/ML IJ SOLN: 6.2500 mg | INTRAMUSCULAR | Status: DC | PRN

## 2017-04-04 MED ORDER — METHOCARBAMOL 1000 MG/10ML IJ SOLN
500.0000 mg | Freq: Four times a day (QID) | INTRAVENOUS | Status: DC | PRN
  Administered 2017-04-04: 500 mg via INTRAVENOUS
  Filled 2017-04-04: qty 550

## 2017-04-04 MED ORDER — BISACODYL 5 MG PO TBEC: 5.0000 mg | DELAYED_RELEASE_TABLET | Freq: Every day | ORAL | Status: DC | PRN

## 2017-04-04 MED ORDER — FLEET ENEMA 7-19 GM/118ML RE ENEM: 1.0000 | ENEMA | Freq: Once | RECTAL | Status: DC | PRN

## 2017-04-04 MED ORDER — HYDROCODONE-ACETAMINOPHEN 10-325 MG PO TABS
2.0000 | ORAL_TABLET | ORAL | Status: DC | PRN
  Administered 2017-04-04: 2 via ORAL
  Administered 2017-04-04 (×2): 1 via ORAL
  Administered 2017-04-05 – 2017-04-06 (×5): 2 via ORAL
  Filled 2017-04-04 (×7): qty 2

## 2017-04-04 MED ORDER — MIDAZOLAM HCL 2 MG/2ML IJ SOLN
1.0000 mg | INTRAMUSCULAR | Status: DC
  Administered 2017-04-04: 2 mg via INTRAVENOUS

## 2017-04-04 MED ORDER — ROPIVACAINE HCL 7.5 MG/ML IJ SOLN
INTRAMUSCULAR | Status: DC | PRN
  Administered 2017-04-04: 20 mL via PERINEURAL

## 2017-04-04 MED ORDER — METHOCARBAMOL 500 MG PO TABS
500.0000 mg | ORAL_TABLET | Freq: Four times a day (QID) | ORAL | Status: DC | PRN
  Administered 2017-04-05 – 2017-04-06 (×3): 500 mg via ORAL
  Filled 2017-04-04 (×4): qty 1

## 2017-04-04 MED ORDER — DEXAMETHASONE SODIUM PHOSPHATE 10 MG/ML IJ SOLN
INTRAMUSCULAR | Status: AC
  Filled 2017-04-04: qty 1

## 2017-04-04 MED ORDER — FENTANYL CITRATE (PF) 100 MCG/2ML IJ SOLN
INTRAMUSCULAR | Status: DC | PRN
  Administered 2017-04-04 (×2): 25 ug via INTRAVENOUS

## 2017-04-04 MED ORDER — EPHEDRINE SULFATE-NACL 50-0.9 MG/10ML-% IV SOSY
PREFILLED_SYRINGE | INTRAVENOUS | Status: DC | PRN
  Administered 2017-04-04: 5 mg via INTRAVENOUS

## 2017-04-04 MED ORDER — FERROUS SULFATE 325 (65 FE) MG PO TABS: 325.0000 mg | ORAL_TABLET | Freq: Three times a day (TID) | ORAL | Status: DC

## 2017-04-04 SURGICAL SUPPLY — 64 items
BAG DECANTER FOR FLEXI CONT (MISCELLANEOUS) ×2 IMPLANT
BAG ZIPLOCK 12X15 (MISCELLANEOUS) IMPLANT
BANDAGE ACE 4X5 VEL STRL LF (GAUZE/BANDAGES/DRESSINGS) ×2 IMPLANT
BANDAGE ACE 6X5 VEL STRL LF (GAUZE/BANDAGES/DRESSINGS) ×2 IMPLANT
BLADE SAG 18X100X1.27 (BLADE) ×2 IMPLANT
BLADE SAW SGTL 11.0X1.19X90.0M (BLADE) ×2 IMPLANT
BNDG CONFORM 4 STRL LF (GAUZE/BANDAGES/DRESSINGS) ×2 IMPLANT
BNDG CONFORM 6X.82 1P STRL (GAUZE/BANDAGES/DRESSINGS) ×2 IMPLANT
BNDG GAUZE ELAST 4 BULKY (GAUZE/BANDAGES/DRESSINGS) ×4 IMPLANT
BONE CEMENT GENTAMICIN (Cement) ×4 IMPLANT
CAP KNEE TOTAL 3 SIGMA ×2 IMPLANT
CEMENT BONE GENTAMICIN 40 (Cement) ×2 IMPLANT
COVER SURGICAL LIGHT HANDLE (MISCELLANEOUS) ×2 IMPLANT
CUFF TOURN SGL QUICK 34 (TOURNIQUET CUFF) ×1
CUFF TRNQT CYL 34X4X40X1 (TOURNIQUET CUFF) ×1 IMPLANT
DECANTER SPIKE VIAL GLASS SM (MISCELLANEOUS) ×2 IMPLANT
DRAPE INCISE IOBAN 66X45 STRL (DRAPES) IMPLANT
DRAPE U-SHAPE 47X51 STRL (DRAPES) ×2 IMPLANT
DRSG ADAPTIC 3X8 NADH LF (GAUZE/BANDAGES/DRESSINGS) ×2 IMPLANT
DRSG PAD ABDOMINAL 8X10 ST (GAUZE/BANDAGES/DRESSINGS) ×4 IMPLANT
DURAPREP 26ML APPLICATOR (WOUND CARE) ×2 IMPLANT
ELECT REM PT RETURN 15FT ADLT (MISCELLANEOUS) ×2 IMPLANT
EVACUATOR 1/8 PVC DRAIN (DRAIN) ×2 IMPLANT
FACESHIELD WRAPAROUND (MASK) ×2 IMPLANT
GAUZE SPONGE 4X4 12PLY STRL (GAUZE/BANDAGES/DRESSINGS) ×2 IMPLANT
GLOVE BIOGEL PI IND STRL 6.5 (GLOVE) ×1 IMPLANT
GLOVE BIOGEL PI IND STRL 8.5 (GLOVE) ×1 IMPLANT
GLOVE BIOGEL PI INDICATOR 6.5 (GLOVE) ×1
GLOVE BIOGEL PI INDICATOR 8.5 (GLOVE) ×1
GLOVE ECLIPSE 8.0 STRL XLNG CF (GLOVE) ×4 IMPLANT
GLOVE SURG SS PI 6.5 STRL IVOR (GLOVE) ×2 IMPLANT
GOWN STRL REUS W/TWL LRG LVL3 (GOWN DISPOSABLE) ×2 IMPLANT
GOWN STRL REUS W/TWL XL LVL3 (GOWN DISPOSABLE) ×2 IMPLANT
HANDPIECE INTERPULSE COAX TIP (DISPOSABLE) ×1
HEMOSTAT SPONGE AVITENE ULTRA (HEMOSTASIS) ×2 IMPLANT
IMMOBILIZER KNEE 20 (SOFTGOODS) ×2
IMMOBILIZER KNEE 20 THIGH 36 (SOFTGOODS) ×1 IMPLANT
MANIFOLD NEPTUNE II (INSTRUMENTS) ×2 IMPLANT
NEEDLE HYPO 21X1.5 SAFETY (NEEDLE) IMPLANT
NEEDLE HYPO 22GX1.5 SAFETY (NEEDLE) IMPLANT
NS IRRIG 1000ML POUR BTL (IV SOLUTION) IMPLANT
PACK TOTAL KNEE CUSTOM (KITS) ×2 IMPLANT
PAD ABD 8X10 STRL (GAUZE/BANDAGES/DRESSINGS) ×2 IMPLANT
PADDING CAST COTTON 6X4 STRL (CAST SUPPLIES) ×2 IMPLANT
PENCIL SMOKE EVAC W/HOLSTER (ELECTROSURGICAL) ×2 IMPLANT
POSITIONER SURGICAL ARM (MISCELLANEOUS) ×2 IMPLANT
SET HNDPC FAN SPRY TIP SCT (DISPOSABLE) ×1 IMPLANT
SET PAD KNEE POSITIONER (MISCELLANEOUS) ×2 IMPLANT
SPONGE LAP 18X18 X RAY DECT (DISPOSABLE) ×2 IMPLANT
STRIP CLOSURE SKIN 1/2X4 (GAUZE/BANDAGES/DRESSINGS) ×4 IMPLANT
SUT BONE WAX W31G (SUTURE) IMPLANT
SUT MNCRL AB 4-0 PS2 18 (SUTURE) ×4 IMPLANT
SUT VIC AB 1 CT1 27 (SUTURE) ×2
SUT VIC AB 1 CT1 27XBRD ANTBC (SUTURE) ×2 IMPLANT
SUT VIC AB 2-0 CT1 27 (SUTURE) ×3
SUT VIC AB 2-0 CT1 TAPERPNT 27 (SUTURE) ×3 IMPLANT
SUT VLOC 180 0 24IN GS25 (SUTURE) ×2 IMPLANT
SYR 20CC LL (SYRINGE) ×4 IMPLANT
TAPE STRIPS DRAPE STRL (GAUZE/BANDAGES/DRESSINGS) ×2 IMPLANT
TOWER CARTRIDGE SMART MIX (DISPOSABLE) ×2 IMPLANT
TRAY FOLEY W/METER SILVER 16FR (SET/KITS/TRAYS/PACK) ×2 IMPLANT
WATER STERILE IRR 1000ML POUR (IV SOLUTION) ×2 IMPLANT
WRAP KNEE MAXI GEL POST OP (GAUZE/BANDAGES/DRESSINGS) ×2 IMPLANT
YANKAUER SUCT BULB TIP 10FT TU (MISCELLANEOUS) ×2 IMPLANT

## 2017-04-04 NOTE — Anesthesia Preprocedure Evaluation (Addendum)
Anesthesia Evaluation  Patient identified by MRN, date of birth, ID band Patient awake    Reviewed: Allergy & Precautions, NPO status , Patient's Chart, lab work & pertinent test results  Airway Mallampati: III  TM Distance: >3 FB Neck ROM: Full    Dental  (+) Teeth Intact, Dental Advisory Given   Pulmonary sleep apnea and Continuous Positive Airway Pressure Ventilation ,    breath sounds clear to auscultation       Cardiovascular hypertension, Pt. on medications + Peripheral Vascular Disease   Rhythm:Regular Rate:Normal     Neuro/Psych PSYCHIATRIC DISORDERS Anxiety Depression  Neuromuscular disease    GI/Hepatic negative GI ROS, Neg liver ROS,   Endo/Other  diabetes, Type 2, Oral Hypoglycemic Agents  Renal/GU      Musculoskeletal  (+) Arthritis ,   Abdominal (+) + obese,   Peds  Hematology   Anesthesia Other Findings   Reproductive/Obstetrics                           Lab Results  Component Value Date   WBC 6.0 03/29/2017   HGB 14.8 03/29/2017   HCT 44.1 03/29/2017   MCV 96.7 03/29/2017   PLT 257 03/29/2017   EKG: normal sinus rhythm.  Anesthesia Physical Anesthesia Plan  ASA: III  Anesthesia Plan: Spinal   Post-op Pain Management:  Regional for Post-op pain   Induction: Intravenous  PONV Risk Score and Plan: 3 and Ondansetron, Dexamethasone and Midazolam  Airway Management Planned: Natural Airway and Nasal Cannula  Additional Equipment:   Intra-op Plan:   Post-operative Plan:   Informed Consent: I have reviewed the patients History and Physical, chart, labs and discussed the procedure including the risks, benefits and alternatives for the proposed anesthesia with the patient or authorized representative who has indicated his/her understanding and acceptance.   Dental advisory given  Plan Discussed with: CRNA  Anesthesia Plan Comments:        Anesthesia Quick  Evaluation

## 2017-04-04 NOTE — Progress Notes (Signed)
Pt states that he will self administer CPAP when ready for bed.  RT to monitor and assess as needed.  

## 2017-04-04 NOTE — Transfer of Care (Signed)
Immediate Anesthesia Transfer of Care Note  Patient: Duane Hughes  Procedure(s) Performed: RIGHT TOTAL KNEE ARTHROPLASTY (Right Knee)  Patient Location: PACU  Anesthesia Type:MAC and Spinal  Level of Consciousness: awake, alert , oriented and patient cooperative  Airway & Oxygen Therapy: Patient Spontanous Breathing and Patient connected to face mask oxygen  Post-op Assessment: Report given to RN and Post -op Vital signs reviewed and stable  Post vital signs: Reviewed and stable  Last Vitals:  Vitals:   04/04/17 1048 04/04/17 1049  BP:    Pulse: 66 66  Resp: 14 16  Temp:    SpO2: 95% 95%    Last Pain:  Vitals:   04/04/17 0843  TempSrc:   PainSc: 3       Patients Stated Pain Goal: 3 (41/28/78 6767)  Complications: No apparent anesthesia complications

## 2017-04-04 NOTE — Anesthesia Procedure Notes (Signed)
Spinal  Patient location during procedure: OR Start time: 04/04/2017 11:36 AM End time: 04/04/2017 11:38 AM Staffing Anesthesiologist: Effie Berkshire, MD Performed: anesthesiologist  Preanesthetic Checklist Completed: patient identified, site marked, surgical consent, pre-op evaluation, timeout performed, IV checked, risks and benefits discussed and monitors and equipment checked Spinal Block Patient position: sitting Prep: Betadine Patient monitoring: heart rate, continuous pulse ox, blood pressure and cardiac monitor Approach: midline Location: L4-5 Injection technique: single-shot Needle Needle type: Introducer and Pencan  Needle gauge: 24 G Needle length: 9 cm Additional Notes Negative paresthesia. Negative blood return. Positive free-flowing CSF. Expiration date of kit checked and confirmed. Patient tolerated procedure well, without complications.

## 2017-04-04 NOTE — Anesthesia Postprocedure Evaluation (Signed)
Anesthesia Post Note  Patient: Duane Hughes  Procedure(s) Performed: RIGHT TOTAL KNEE ARTHROPLASTY (Right Knee)     Patient location during evaluation: PACU Anesthesia Type: Spinal Level of consciousness: oriented and awake and alert Pain management: pain level controlled Vital Signs Assessment: post-procedure vital signs reviewed and stable Respiratory status: spontaneous breathing, respiratory function stable and patient connected to nasal cannula oxygen Cardiovascular status: blood pressure returned to baseline and stable Postop Assessment: no headache, no backache and no apparent nausea or vomiting Anesthetic complications: no    Last Vitals:  Vitals:   04/04/17 1600 04/04/17 1703  BP: (!) 150/80 134/74  Pulse: 73 70  Resp: 14 18  Temp: 36.6 C 36.7 C  SpO2: 97% 98%    Last Pain:  Vitals:   04/04/17 1703  TempSrc: Oral  PainSc:                  Effie Berkshire

## 2017-04-04 NOTE — Brief Op Note (Signed)
04/04/2017  1:26 PM  PATIENT:  Duane Hughes  66 y.o. male  PRE-OPERATIVE DIAGNOSIS:Primary   Osteoarthritis Right Knee with Flexion Contracture  POST-OPERATIVE DIAGNOSIS:Primary  Osteoarthritis Right Knee with Flexion Contracture  PROCEDURE:  Procedure(s): RIGHT TOTAL KNEE ARTHROPLASTY (Right) with release of Flexion Contractures  SURGEON:  Surgeon(s) and Role:    Latanya Maudlin, MD - Primary  PHYSICIAN ASSISTANT:Amber Alton PA   ASSISTANTS:Amber Waiohinu PA   ANESTHESIA:   spinal and Adductor Block.  EBL:  50 mL   BLOOD ADMINISTERED:none  DRAINS: (one) Hemovact drain(s) in the Right Knee with  Suction Open   LOCAL MEDICATIONS USED:  OTHER 20cc of Exparel with 20cc of Normal Saline.  SPECIMEN:  No Specimen  DISPOSITION OF SPECIMEN:  N/A  COUNTS:  YES  TOURNIQUET:  * Missing tourniquet times found for documented tourniquets in log: [id number] *  DICTATION: .Other Dictation: Dictation Number [id number]  PLAN OF CARE: Admit to inpatient   PATIENT DISPOSITION:  Stable in OR   Delay start of Pharmacological VTE agent (>24hrs) due to surgical blood loss or risk of bleeding: yes

## 2017-04-04 NOTE — Progress Notes (Signed)
Assisted Dr. Hollis with right, ultrasound guided, adductor canal block. Side rails up, monitors on throughout procedure. See vital signs in flow sheet. Tolerated Procedure well.  

## 2017-04-04 NOTE — Anesthesia Procedure Notes (Signed)
Anesthesia Regional Block: Adductor canal block   Pre-Anesthetic Checklist: ,, timeout performed, Correct Patient, Correct Site, Correct Laterality, Correct Procedure, Correct Position, site marked, Risks and benefits discussed,  Surgical consent,  Pre-op evaluation,  At surgeon's request and post-op pain management  Laterality: Right  Prep: chloraprep       Needles:  Injection technique: Single-shot  Needle Type: Echogenic Needle     Needle Length: 9cm  Needle Gauge: 21     Additional Needles:   Procedures:,,,, ultrasound used (permanent image in chart),,,,  Narrative:  Start time: 04/04/2017 10:25 AM End time: 04/04/2017 10:35 AM Injection made incrementally with aspirations every 5 mL.  Performed by: Personally  Anesthesiologist: Effie Berkshire, MD  Additional Notes: Patient tolerated the procedure well. Local anesthetic introduced in an incremental fashion under minimal resistance after negative aspirations. No paresthesias were elicited. After completion of the procedure, no acute issues were identified and patient continued to be monitored by RN.

## 2017-04-05 LAB — CBC
HCT: 38.6 % — ABNORMAL LOW (ref 39.0–52.0)
Hemoglobin: 12.8 g/dL — ABNORMAL LOW (ref 13.0–17.0)
MCH: 31.9 pg (ref 26.0–34.0)
MCHC: 33.2 g/dL (ref 30.0–36.0)
MCV: 96.3 fL (ref 78.0–100.0)
PLATELETS: 230 10*3/uL (ref 150–400)
RBC: 4.01 MIL/uL — AB (ref 4.22–5.81)
RDW: 12.3 % (ref 11.5–15.5)
WBC: 9.9 10*3/uL (ref 4.0–10.5)

## 2017-04-05 LAB — BASIC METABOLIC PANEL
ANION GAP: 7 (ref 5–15)
BUN: 13 mg/dL (ref 6–20)
CALCIUM: 8.7 mg/dL — AB (ref 8.9–10.3)
CO2: 25 mmol/L (ref 22–32)
CREATININE: 0.9 mg/dL (ref 0.61–1.24)
Chloride: 104 mmol/L (ref 101–111)
GFR calc Af Amer: >60 mL/min (ref >60)
GLUCOSE: 153 mg/dL — AB (ref 65–99)
Potassium: 4.1 mmol/L (ref 3.5–5.1)
Sodium: 136 mmol/L (ref 135–145)

## 2017-04-05 LAB — GLUCOSE, CAPILLARY
GLUCOSE-CAPILLARY: 124 mg/dL — AB (ref 65–99)
GLUCOSE-CAPILLARY: 147 mg/dL — AB (ref 65–99)
Glucose-Capillary: 134 mg/dL — ABNORMAL HIGH (ref 65–99)
Glucose-Capillary: 181 mg/dL — ABNORMAL HIGH (ref 65–99)

## 2017-04-05 NOTE — Progress Notes (Signed)
Occupational Therapy Treatment Patient Details Name: Duane Hughes MRN: [id number] DOB: November 28, 1950 Today's Date: 04/05/2017    History of present illness 66yo M s/p R TKA.   OT comments  Returned this afternoon to practice ADL transfers. Patient tolerated well with no dizziness. BP 114/64, HR 80 bpm, O2 97% on RA. Practiced 3 in 1 over toilet transfer and walk-in shower transfer. Wife to assist with LB self-care as she did prior to admission. Will check in with patient tomorrow for any questions. OT will follow.   Follow Up Recommendations  DC plan and follow up therapy as arranged by surgeon;Supervision/Assistance - 24 hour    Equipment Recommendations  3 in 1 bedside commode    Recommendations for Other Services      Precautions / Restrictions Precautions Precautions: Knee;Fall Required Braces or Orthoses: Knee Immobilizer - Right Restrictions Weight Bearing Restrictions: No Other Position/Activity Restrictions: WBAT       Mobility Bed Mobility           General comment: Patient up in recliner upon arrival; returned to recliner at end of session   Transfers Overall transfer level: Needs assistance Equipment used: Rolling walker (2 wheeled) Transfers: Sit to/from Stand Sit to Stand: Min guard         General transfer comment: verbal cues for UE and LE positioning    Balance                                           ADL either performed or assessed with clinical judgement   ADL Overall ADL's : Needs assistance/impaired Eating/Feeding: Independent;Sitting   Grooming: Min guard;Standing           Upper Body Dressing : Minimal assistance;Sitting Upper Body Dressing Details (indicate cue type and reason): don robe Lower Body Dressing: Moderate assistance;Sit to/from stand Lower Body Dressing Details (indicate cue type and reason): wife assisted with LB self-care PTA Toilet Transfer: Min guard;Ambulation;BSC;RW   Toileting-  Water quality scientist and Hygiene: Min guard;Sit to/from stand Toileting - Clothing Manipulation Details (indicate cue type and reason): pt also used urinal in bathroom while practicing ADL transfers Tub/ Shower Transfer: Walk-in shower;Minimal assistance;Ambulation;3 in 1;Rolling walker   Functional mobility during ADLs: Min guard;Minimal assistance;Rolling walker General ADL Comments: Returned later and practiced toilet and shower transfers. Educated that 3 in 1 can be used in walk-in shower if needed. Patient practiced toilet tranfer with 3 in 1 over toilet. Needed min cues for technique. Tolerated well. No episodes of dizziness.     Vision Baseline Vision/History: Wears glasses Patient Visual Report: No change from baseline     Perception     Praxis      Cognition Arousal/Alertness: Awake/alert Behavior During Therapy: WFL for tasks assessed/performed Overall Cognitive Status: Within Functional Limits for tasks assessed                                          Exercises    Shoulder Instructions       General Comments BP 114/64, HR 80 bpm, O2 97% on RA after activity    Pertinent Vitals/ Pain       Pain Assessment: 0-10 Pain Score: 4  Pain Location: R knee Pain Descriptors / Indicators: Aching;Sore Pain Intervention(s): Limited activity within patient's tolerance;Monitored during  session;Repositioned  Home Living Family/patient expects to be discharged to:: Private residence Living Arrangements: Spouse/significant other Available Help at Discharge: Family Type of Home: House Home Access: Stairs to enter Technical brewer of Steps: 3-5 Entrance Stairs-Rails: None Home Layout: One level     Bathroom Shower/Tub: Occupational psychologist: Standard(comfort height) Bathroom Accessibility: Yes How Accessible: Accessible via walker Home Equipment: [place] - 2 wheels          Prior Functioning/Environment Level of Independence: Needs  assistance  Gait / Transfers Assistance Needed: independent ADL's / Homemaking Assistance Needed: wife assisted with LB self-care PTA Communication / Swallowing Assistance Needed: none     Frequency  Min 2X/week        Progress Toward Goals  OT Goals(current goals can now be found in the care plan section)  Progress towards OT goals: Progressing toward goals  Acute Rehab OT Goals Patient Stated Goal: regain independence OT Goal Formulation: With patient Time For Goal Achievement: 04/19/17 Potential to Achieve Goals: Good ADL Goals Pt Will Transfer to Toilet: with supervision;ambulating;bedside commode Pt Will Perform Toileting - Clothing Manipulation and hygiene: with supervision;sit to/from stand Pt Will Perform Tub/Shower Transfer: Shower transfer;with supervision;ambulating;3 in 1;rolling walker  Plan Discharge plan remains appropriate    Co-evaluation                 AM-PAC PT "6 Clicks" Daily Activity     Outcome Measure   Help from another person eating meals?: None Help from another person taking care of personal grooming?: A Little Help from another person toileting, which includes using toliet, bedpan, or urinal?: A Lot Help from another person bathing (including washing, rinsing, drying)?: A Lot Help from another person to put on and taking off regular upper body clothing?: None Help from another person to put on and taking off regular lower body clothing?: A Lot 6 Click Score: 17    End of Session Equipment Utilized During Treatment: Rolling walker;Right knee immobilizer  OT Visit Diagnosis: Unsteadiness on feet (R26.81);Muscle weakness (generalized) (M62.81);Pain Pain - Right/Left: Right Pain - part of body: Knee   Activity Tolerance Patient tolerated treatment well   Patient Left in chair;with call bell/phone within reach;with family/visitor present   Nurse Communication Mobility status        Time: 9381-8299 OT Time Calculation (min):  21 min  Charges: OT General Charges $OT Visit: 1 Visit OT Treatments $Self Care/Home Management : 8-22 mins     Biran Mayberry A Kandas Oliveto 04/05/2017, 1:48 PM

## 2017-04-05 NOTE — Progress Notes (Signed)
   Subjective: 1 Day Post-Op Procedure(s) (LRB): RIGHT TOTAL KNEE ARTHROPLASTY (Right) Patient reports pain as mild.   Patient seen in rounds for Dr. Gladstone Lighter. Patient is well, and has had no acute complaints or problems. No issues overnight. Feels that pain is well managed. No SOB or chest pain. No dizziness or lightheadedness. Positive flatus. Catheter just removed.  We will start therapy today.  Plan is to go Home after hospital stay.  Objective: Vital signs in last 24 hours: Temp:  [97.6 F (36.4 C)-98.6 F (37 C)] 97.9 F (36.6 C) (12/06 0537) Pulse Rate:  [63-83] 78 (12/06 0537) Resp:  [10-25] 18 (12/06 0537) BP: (108-163)/(63-94) 108/63 (12/06 0537) SpO2:  [93 %-99 %] 95 % (12/06 0537) Weight:  [126.6 kg (279 lb)] 126.6 kg (279 lb) (12/05 1600)  Intake/Output from previous day:  Intake/Output Summary (Last 24 hours) at 04/05/2017 0743 Last data filed at 04/05/2017 6579 Gross per 24 hour  Intake 5681.67 ml  Output 1685 ml  Net 3996.67 ml     Labs: Recent Labs    04/05/17 0534  HGB 12.8*   Recent Labs    04/05/17 0534  WBC 9.9  RBC 4.01*  HCT 38.6*  PLT 230   Recent Labs    04/05/17 0534  NA 136  K 4.1  CL 104  CO2 25  BUN 13  CREATININE 0.90  GLUCOSE 153*  CALCIUM 8.7*    EXAM General - Patient is Alert and Oriented Extremity - Neurologically intact Intact pulses distally Dorsiflexion/Plantar flexion intact No cellulitis present Compartment soft Dressing - no drainage Motor Function - intact, moving foot and toes well on exam.  Hemovac pulled without difficulty.  Past Medical History:  Diagnosis Date  . Anxiety   . Arthritis    back, hands, knees  . Colon polyp 05/09/2016   TUBULAR ADENOMAS   . Depression   . Diabetes mellitus without complication ([place])    type 2  . Ectatic abdominal aorta ([place]) 04/18/2016   2.9 cm on [place] 2016, unchanged 2017  . History of skin cancer    hand  . Hypertension   . Morbid obesity ([place]) 11/11/2014  .  Obesity 11/11/2014  . Sleep apnea    cpap    Assessment/Plan: 1 Day Post-Op Procedure(s) (LRB): RIGHT TOTAL KNEE ARTHROPLASTY (Right) Active Problems:   Hx of total knee arthroplasty, right  Estimated body mass index is 42.42 kg/m as calculated from the following:   Height as of this encounter: 5\' 8"  (1.727 m).   Weight as of this encounter: 126.6 kg (279 lb). Advance diet Up with therapy D/C IV fluids once tolerating POs well and after therapy. May need bolus of fluid depending on BP response.   DVT Prophylaxis - Xarelto Weight-Bearing as tolerated   Alexavier will get up with therapy today. Will monitor BP. May require bolus. Plan for DC home tomorrow depending on progress.   Ardeen Jourdain, PA-C Orthopaedic Surgery 04/05/2017, 7:43 AM

## 2017-04-05 NOTE — Op Note (Signed)
NAMEMURREL, BERTRAM               ACCOUNT NO.:  [id number]  MEDICAL RECORD NO.:  [id number]  LOCATION:                                 FACILITY:  PHYSICIAN:  Kipp Brood. Caleb Decock, M.D.DATE OF BIRTH:  May 24, 1950  DATE OF PROCEDURE:  04/04/2017 DATE OF DISCHARGE:                              OPERATIVE REPORT   SURGEON:  Kipp Brood. Gladstone Lighter, M.D.  ASSISTANT:  Ardeen Jourdain, [place]  PREOPERATIVE DIAGNOSES: 1. Severe primary osteoarthritis of the right knee. 2. Severe flexion contractures, right knee.  POSTOPERATIVE DIAGNOSES: 1. Severe primary osteoarthritis of the right knee. 2. Severe flexion contractures, right knee.  OPERATION:  Right total knee arthroplasty utilizing the DePuy system, all three components were cemented and gentamicin was used in the cement.  The sizes used were as follows:  The femoral component was a size 5 right posterior cruciate sacrificing type.  The tibial tray was a size 5.  The insert was a size 5 polyethylene rotating platform, 10-mm thickness insert.  The patella button was a size 41 with 3 pegs.  DESCRIPTION OF PROCEDURE:  Under spinal anesthesia, routine orthopedic prep and draping of the right lower extremity was carried out.  Note, prior to coming in to the operating room, he had an adductor block. After this, sterile prep and draping that carried out of the right lower extremity, he had 3 g of IV Ancef and 1 g of tranexamic acid.  The appropriate time-out was carried out, also marked the appropriate right leg in the holding area.  After all this was done, I then exsanguinated the leg with an Esmarch and the tourniquet was elevated to 325 mmHg. Following that, the knee was placed in a DeMayo knee holder and flexed. An anterior approach of the knee was carried out.  Two flaps were created.  I then carried out a median parapatellar incision, reflected the patella laterally.  Following that, we did medial and lateral meniscectomies and excised the  anterior and posterior cruciate ligaments.  Note, the knee was extremely tight.  We first removed all the tibial spine from the tibia at this time.  We then measured the tibial tray to be a size 5.  We utilized the external guide, made our appropriate cut utilizing the medial side of the tibia as our guides. Prior to doing this, we made an initial drill hole in the intercondylar notch to the femur.  We then inserted the guide rod up the femur, we were well within the canal.  We then irrigated out the canal and then removed 12-mm thickness off the distal femur.  At this time, the femur was measured to be a size 5.  We did anterior, posterior and chamfering cuts for a size femoral component.  Following that, we then prepared the tibia as I mentioned.  We then did our keel cut out of the proximal tibia metaphysis in usual fashion.  We, prior to doing that, inserted our lamina spreaders, removed the posterior spurs and then our spacer block was inserted and we had a nice fit with the 10-mm thickness spacer block.  At this particular time, we removed the spacer block and then completed our keel  cut out of the tibial plateau.  We then did our notch cut out of the distal femur for a size right femoral component.  All trial components were inserted.  With the knee extended and the trial components in place, I did a resurfacing procedure on the patella for a size 41-mm patella.  Three drill holes were made in the articular surface of the patella.  We then inserted our trial button and had a nice fit.  We removed all trial components, thoroughly water picked out the knee, dried the knee out, cemented all three components in simultaneously with gentamicin in the cement.  After this was done, we then removed all loose pieces of cement, irrigated out the knee again. I then inserted my permanent rotating platform size 5, 10-mm thickness and reduced the knee and had a nice fit.  We had good extension  and flexion, had good medial and lateral stability.  We irrigated the knee out with antibiotics.  We inserted a Hemovac drain and closed the wound layers over the Hemovac drain in usual fashion.  Prior to closing the wound, he had 20 mL of Exparel mixed with 20 mL of normal saline.  He will be admitted for two nights.  He will be on the appropriate anticoagulant.          ______________________________ Kipp Brood. Gladstone Lighter, M.D.     RAG/MEDQ  D:  04/04/2017  T:  04/05/2017  Job:  [id number]

## 2017-04-05 NOTE — Progress Notes (Signed)
Occupational Therapy Evaluation Patient Details Name: Duane Hughes MRN: [id number] DOB: [birth year] Today's Date: 04/05/2017    History of Present Illness 66yo M s/p R TKA.   Clinical Impression   Patient presents to OT with decreased ADL independence and safety due to the deficits listed below. He will benefit from skilled OT to maximize function and to facilitate a safe discharge. OT will follow.    Follow Up Recommendations  DC plan and follow up therapy as arranged by surgeon;Supervision/Assistance - 24 hour    Equipment Recommendations  3 in 1 bedside commode    Recommendations for Other Services       Precautions / Restrictions Precautions Precautions: Knee;Fall Restrictions Weight Bearing Restrictions: No Other Position/Activity Restrictions: WBAT      Mobility Bed Mobility               General bed mobility comments: NT -- OOB in recliner upon arrival  Transfers                 General transfer comment: NT -- patient asked OT to return later    Balance                                           ADL either performed or assessed with clinical judgement   ADL                                         General ADL Comments: Patient requested OT return later today to practice ADL transfers because his lunch tray was delivered during this session. OT will return.      Vision Baseline Vision/History: Wears glasses Patient Visual Report: No change from baseline       Perception     Praxis      Pertinent Vitals/Pain Pain Assessment: 0-10 Pain Score: 3  Pain Location: R knee Pain Descriptors / Indicators: Aching;Sore Pain Intervention(s): Monitored during session;Limited activity within patient's tolerance     Hand Dominance     Extremity/Trunk Assessment Upper Extremity Assessment Upper Extremity Assessment: Overall WFL for tasks assessed   Lower Extremity Assessment Lower Extremity Assessment:  Defer to PT evaluation       Communication Communication Communication: No difficulties   Cognition Arousal/Alertness: Awake/alert Behavior During Therapy: WFL for tasks assessed/performed Overall Cognitive Status: Within Functional Limits for tasks assessed                                     General Comments       Exercises     Shoulder Instructions      Home Living Family/patient expects to be discharged to:: Private residence Living Arrangements: Spouse/significant other Available Help at Discharge: Family               Bathroom Shower/Tub: Walk-in Psychologist, prison and probation services: Standard(comfort height) Bathroom Accessibility: Yes How Accessible: Accessible via walker            Prior Functioning/Environment Level of Independence: Needs assistance  Gait / Transfers Assistance Needed: independent ADL's / Homemaking Assistance Needed: wife assisted with LB self-care PTA Communication / Swallowing Assistance Needed: none          OT Problem  List: Decreased strength;Decreased range of motion;Decreased activity tolerance;Decreased knowledge of use of DME or AE;Pain      OT Treatment/Interventions: Self-care/ADL training;DME and/or AE instruction;Therapeutic activities;Patient/family education    OT Goals(Current goals can be found in the care plan section) Acute Rehab OT Goals Patient Stated Goal: regain independence OT Goal Formulation: With patient Time For Goal Achievement: 04/19/17 Potential to Achieve Goals: Good ADL Goals Pt Will Transfer to Toilet: with supervision;ambulating;bedside commode Pt Will Perform Toileting - Clothing Manipulation and hygiene: with supervision;sit to/from stand Pt Will Perform Tub/Shower Transfer: Shower transfer;with supervision;ambulating;3 in 1;rolling walker  OT Frequency: Min 2X/week   Barriers to D/C:            Co-evaluation              AM-PAC PT "6 Clicks" Daily Activity     Outcome  Measure Help from another person eating meals?: None Help from another person taking care of personal grooming?: A Little Help from another person toileting, which includes using toliet, bedpan, or urinal?: A Lot Help from another person bathing (including washing, rinsing, drying)?: A Lot Help from another person to put on and taking off regular upper body clothing?: None Help from another person to put on and taking off regular lower body clothing?: A Lot 6 Click Score: 17   End of Session    Activity Tolerance: Patient tolerated treatment well Patient left: in chair;with call bell/phone within reach;with family/visitor present  OT Visit Diagnosis: Unsteadiness on feet (R26.81);Muscle weakness (generalized) (M62.81);Pain Pain - Right/Left: Right Pain - part of body: Knee                Time: 8416-6063 OT Time Calculation (min): 9 min Charges:  OT General Charges $OT Visit: 1 Visit OT Evaluation $OT Eval Low Complexity: 1 Low G-Codes:       Costas Sena A Zebedee Segundo 07-Apr-2017, 1:04 PM

## 2017-04-05 NOTE — Progress Notes (Signed)
Discharge planning, spoke with patient and spouse at bedside. Have chosen [facility] for [facility] PT. Contacted [facility] for referral. Has RW but needs a 3n1, contacted [facility] to deliver to room. [phone]

## 2017-04-05 NOTE — Progress Notes (Signed)
[facility] unable to provide [facility] services after doing insurance auth. States [facility] in network, contacted [facility] for referral. They will f/u with patient.

## 2017-04-05 NOTE — Evaluation (Signed)
Physical Therapy Evaluation Patient Details Name: Duane Hughes MRN: [id number] DOB: 10-12-1950 Today's Date: 04/05/2017   History of Present Illness  66yo M s/p R TKA.  Clinical Impression  Pt is s/p TKA resulting in the deficits listed below (see PT Problem List).  Pt will benefit from skilled PT to increase their independence and safety with mobility to allow discharge to the venue listed below.  Pt assisted with ambulating in hallway.  Pt reported dizziness upon sitting in recliner.  BP 81/60 mmHg, HR 65 bpm, SPo2 93% room air and RN notified.     Follow Up Recommendations Home health PT    Equipment Recommendations  None recommended by PT    Recommendations for Other Services       Precautions / Restrictions Precautions Precautions: Knee;Fall Required Braces or Orthoses: Knee Immobilizer - Right Restrictions Weight Bearing Restrictions: No Other Position/Activity Restrictions: WBAT      Mobility  Bed Mobility Overal bed mobility: Needs Assistance Bed Mobility: Supine to Sit     Supine to sit: Supervision     General bed mobility comments: verbal cues for technique  Transfers Overall transfer level: Needs assistance Equipment used: Rolling walker (2 wheeled) Transfers: Sit to/from Stand Sit to Stand: Min guard         General transfer comment: verbal cues for UE and LE positioning  Ambulation/Gait Ambulation/Gait assistance: Min assist Ambulation Distance (Feet): 80 Feet Assistive device: Rolling walker (2 wheeled) Gait Pattern/deviations: Step-to pattern;Decreased stance time - right;Antalgic     General Gait Details: verbal cues for sequence, RW positioning, step length, posture, only min assist for LOB with taking hands off RW to look at adjusting height otherwise min/guard for ambulation  Stairs            Wheelchair Mobility    Modified Rankin (Stroke Patients Only)       Balance                                              Pertinent Vitals/Pain Pain Assessment: 0-10 Pain Score: 4  Pain Location: R knee Pain Descriptors / Indicators: Aching;Sore Pain Intervention(s): Limited activity within patient's tolerance;Repositioned;Monitored during session    [place] expects to be discharged to:: Private residence Living Arrangements: Spouse/significant other Available Help at Discharge: Family Type of Home: House Home Access: Stairs to enter Entrance Stairs-Rails: None Technical brewer of Steps: 3-5 Home Layout: One level Home Equipment: Environmental consultant - 2 wheels      Prior Function Level of Independence: Needs assistance   Gait / Transfers Assistance Needed: independent  ADL's / Homemaking Assistance Needed: wife assisted with LB self-care PTA        Hand Dominance        Extremity/Trunk Assessment   Upper Extremity Assessment Upper Extremity Assessment: Overall WFL for tasks assessed    Lower Extremity Assessment Lower Extremity Assessment: RLE deficits/detail RLE Deficits / Details: unable to perform SLR, maintained KI, ROM TBA       Communication   Communication: No difficulties  Cognition Arousal/Alertness: Awake/alert Behavior During Therapy: WFL for tasks assessed/performed Overall Cognitive Status: Within Functional Limits for tasks assessed  General Comments      Exercises Total Joint Exercises Ankle Circles/Pumps: AROM;Both;10 reps Quad Sets: AROM;10 reps;Both   Assessment/Plan    PT Assessment Patient needs continued PT services  PT Problem List Decreased strength;Decreased mobility;Decreased range of motion;Decreased knowledge of use of DME;Decreased knowledge of precautions;Pain       PT Treatment Interventions Stair training;Gait training;Therapeutic exercise;Patient/family education;Functional mobility training;Therapeutic activities;DME instruction    PT Goals (Current goals  can be found in the Care Plan section)  Acute Rehab PT Goals Patient Stated Goal: regain independence PT Goal Formulation: With patient Time For Goal Achievement: 04/10/17 Potential to Achieve Goals: Good    Frequency 7X/week   Barriers to discharge        Co-evaluation               AM-PAC PT "6 Clicks" Daily Activity  Outcome Measure Difficulty turning over in bed (including adjusting bedclothes, sheets and blankets)?: None Difficulty moving from lying on back to sitting on the side of the bed? : A Little Difficulty sitting down on and standing up from a chair with arms (e.g., wheelchair, bedside commode, etc,.)?: A Little Help needed moving to and from a bed to chair (including a wheelchair)?: A Little Help needed walking in hospital room?: A Little Help needed climbing 3-5 steps with a railing? : A Lot 6 Click Score: 18    End of Session Equipment Utilized During Treatment: Gait belt Activity Tolerance: Other (comment)(drop in BP, RN notified) Patient left: in chair;with call bell/phone within reach;with family/visitor present;with chair alarm set Nurse Communication: Mobility status PT Visit Diagnosis: Other abnormalities of gait and mobility (R26.89)    Time: 7824-2353 PT Time Calculation (min) (ACUTE ONLY): 20 min   Charges:   PT Evaluation $PT Eval Low Complexity: 1 Low     PT G CodesCarmelia Bake, PT, DPT 04/05/2017 Pager: [phone]   York Ram E 04/05/2017, 1:22 PM

## 2017-04-05 NOTE — Progress Notes (Signed)
Physical Therapy Treatment Patient Details Name: Duane Hughes MRN: [id number] DOB: Aug 27, 1950 Today's Date: 04/05/2017    History of Present Illness 66yo M s/p R TKA.    PT Comments    Pt reports feeling better this afternoon and able to increase ambulation distance.  Pt also performed LE exercises.  Pt plans to d/c home tomorrow.  Follow Up Recommendations  Home health PT     Equipment Recommendations  None recommended by PT    Recommendations for Other Services       Precautions / Restrictions Precautions Precautions: Knee;Fall Required Braces or Orthoses: Knee Immobilizer - Right Restrictions Weight Bearing Restrictions: No Other Position/Activity Restrictions: WBAT    Mobility  Bed Mobility Overal bed mobility: Needs Assistance Bed Mobility: Sit to Supine     Supine to sit: Supervision Sit to supine: Supervision;HOB elevated   General bed mobility comments: used momentum to swing LEs onto bed  Transfers Overall transfer level: Needs assistance Equipment used: Rolling walker (2 wheeled) Transfers: Sit to/from Stand Sit to Stand: Min guard         General transfer comment: verbal cues for UE and LE positioning  Ambulation/Gait Ambulation/Gait assistance: Min guard Ambulation Distance (Feet): 120 Feet Assistive device: Rolling walker (2 wheeled) Gait Pattern/deviations: Step-to pattern;Decreased stance time - right;Antalgic     General Gait Details: verbal cues for sequence, RW positioning, step length, posture   Stairs            Wheelchair Mobility    Modified Rankin (Stroke Patients Only)       Balance                                            Cognition Arousal/Alertness: Awake/alert Behavior During Therapy: WFL for tasks assessed/performed Overall Cognitive Status: Within Functional Limits for tasks assessed                                        Exercises Total Joint Exercises Ankle  Circles/Pumps: AROM;Both;10 reps Quad Sets: AROM;10 reps;Both Short Arc Quad: AAROM;Right;10 reps Heel Slides: Right;AAROM;10 reps Hip ABduction/ADduction: AAROM;Right;10 reps Straight Leg Raises: AAROM;Right;10 reps Goniometric ROM: approx 45* AAROM R knee flexion    General Comments General comments (skin integrity, edema, etc.): BP 114/64, HR 80 bpm, O2 97% on RA after activity      Pertinent Vitals/Pain Pain Assessment: 0-10 Pain Score: 5  Pain Location: R knee Pain Descriptors / Indicators: Aching;Sore Pain Intervention(s): Limited activity within patient's tolerance;Repositioned;Monitored during session    [place] expects to be discharged to:: Private residence Living Arrangements: Spouse/significant other Available Help at Discharge: Family Type of Home: House Home Access: Stairs to enter Entrance Stairs-Rails: None Home Layout: One level Home Equipment: Environmental consultant - 2 wheels      Prior Function Level of Independence: Needs assistance  Gait / Transfers Assistance Needed: independent ADL's / Homemaking Assistance Needed: wife assisted with LB self-care PTA     PT Goals (current goals can now be found in the care plan section) Acute Rehab PT Goals Patient Stated Goal: regain independence PT Goal Formulation: With patient Time For Goal Achievement: 04/10/17 Potential to Achieve Goals: Good Progress towards PT goals: Progressing toward goals    Frequency    7X/week  PT Plan Current plan remains appropriate    Co-evaluation              AM-PAC PT "6 Clicks" Daily Activity  Outcome Measure  Difficulty turning over in bed (including adjusting bedclothes, sheets and blankets)?: None Difficulty moving from lying on back to sitting on the side of the bed? : A Little Difficulty sitting down on and standing up from a chair with arms (Hughes.g., wheelchair, bedside commode, etc,.)?: A Little Help needed moving to and from a bed to chair  (including a wheelchair)?: A Little Help needed walking in hospital room?: A Little Help needed climbing 3-5 steps with a railing? : A Little 6 Click Score: 19    End of Session Equipment Utilized During Treatment: Gait belt Activity Tolerance: Patient tolerated treatment well Patient left: with call bell/phone within reach;with family/visitor present;in bed Nurse Communication: Mobility status PT Visit Diagnosis: Other abnormalities of gait and mobility (R26.89)     Time: 8325-4982 PT Time Calculation (min) (ACUTE ONLY): 16 min  Charges:  $Therapeutic Exercise: 8-22 mins                    G Codes:      Duane Hughes, PT, DPT 04/05/2017 Pager: [phone]  Duane Hughes 04/05/2017, 3:26 PM

## 2017-04-05 NOTE — Progress Notes (Signed)
Pt prefers self placement of CPAP tonight.  RT to monitor and assess as needed.

## 2017-04-06 LAB — CBC
HEMATOCRIT: 36.2 % — AB (ref 39.0–52.0)
HEMOGLOBIN: 12.1 g/dL — AB (ref 13.0–17.0)
MCH: 32.2 pg (ref 26.0–34.0)
MCHC: 33.4 g/dL (ref 30.0–36.0)
MCV: 96.3 fL (ref 78.0–100.0)
Platelets: 225 10*3/uL (ref 150–400)
RBC: 3.76 MIL/uL — AB (ref 4.22–5.81)
RDW: 12.6 % (ref 11.5–15.5)
WBC: 8.9 10*3/uL (ref 4.0–10.5)

## 2017-04-06 LAB — GLUCOSE, CAPILLARY: GLUCOSE-CAPILLARY: 150 mg/dL — AB (ref 65–99)

## 2017-04-06 LAB — BASIC METABOLIC PANEL
ANION GAP: 8 (ref 5–15)
BUN: 14 mg/dL (ref 6–20)
CHLORIDE: 105 mmol/L (ref 101–111)
CO2: 25 mmol/L (ref 22–32)
CREATININE: 0.83 mg/dL (ref 0.61–1.24)
Calcium: 8.5 mg/dL — ABNORMAL LOW (ref 8.9–10.3)
GFR calc non Af Amer: >60 mL/min (ref >60)
Glucose, Bld: 139 mg/dL — ABNORMAL HIGH (ref 65–99)
POTASSIUM: 3.6 mmol/L (ref 3.5–5.1)
SODIUM: 138 mmol/L (ref 135–145)

## 2017-04-06 MED ORDER — ASPIRIN EC 325 MG PO TBEC: 325.0000 mg | DELAYED_RELEASE_TABLET | Freq: Two times a day (BID) | ORAL | 0 refills | Status: DC

## 2017-04-06 MED ORDER — OXYCODONE-ACETAMINOPHEN 5-325 MG PO TABS: 1.0000 | ORAL_TABLET | Freq: Four times a day (QID) | ORAL | 0 refills | Status: DC | PRN

## 2017-04-06 MED ORDER — METHOCARBAMOL 500 MG PO TABS: 500.0000 mg | ORAL_TABLET | Freq: Four times a day (QID) | ORAL | 1 refills | Status: DC | PRN

## 2017-04-06 NOTE — Discharge Instructions (Signed)

## 2017-04-06 NOTE — Progress Notes (Signed)
Physical Therapy Treatment Patient Details Name: Duane Hughes MRN: [id number] DOB: 02-May-1950 Today's Date: 04/06/2017    History of Present Illness 66yo M s/p R TKA.    PT Comments    Pt ambulated in hallway and practiced safe stair technique.  Pt's spouse assisted with holding RW for steps.  Pt also performed LE exercises.  Pt provided with handouts on HEP and stairs.  Pt feels ready for d/c home today.   Follow Up Recommendations  Home health PT     Equipment Recommendations  None recommended by PT    Recommendations for Other Services       Precautions / Restrictions Precautions Precautions: Knee;Fall Required Braces or Orthoses: Knee Immobilizer - Right Restrictions Other Position/Activity Restrictions: WBAT    Mobility  Bed Mobility               General bed mobility comments: pt up in recliner on arrival  Transfers Overall transfer level: Needs assistance Equipment used: Rolling walker (2 wheeled) Transfers: Sit to/from Stand Sit to Stand: Supervision         General transfer comment: verbal cues for UE and LE positioning  Ambulation/Gait Ambulation/Gait assistance: Min guard Ambulation Distance (Feet): 80 Feet Assistive device: Rolling walker (2 wheeled) Gait Pattern/deviations: Step-to pattern;Decreased stance time - right;Antalgic     General Gait Details: verbal cues for sequence, RW positioning, step length, posture   Stairs Stairs: Yes   Stair Management: Step to pattern;Backwards;With walker Number of Stairs: 2 General stair comments: verbal cues for sequence, safety, RW positioning, spouse assisted with RW, provided handout  Wheelchair Mobility    Modified Rankin (Stroke Patients Only)       Balance                                            Cognition Arousal/Alertness: Awake/alert Behavior During Therapy: WFL for tasks assessed/performed Overall Cognitive Status: Within Functional Limits for tasks  assessed                                        Exercises Total Joint Exercises Ankle Circles/Pumps: AROM;Both;10 reps Quad Sets: AROM;10 reps;Both Short Arc Quad: AAROM;Right;10 reps Heel Slides: Right;AAROM;10 reps Hip ABduction/ADduction: Right;10 reps;AROM Straight Leg Raises: AAROM;Right;10 reps    General Comments        Pertinent Vitals/Pain Pain Assessment: 0-10 Pain Score: 4  Pain Location: R knee Pain Descriptors / Indicators: Aching;Sore Pain Intervention(s): Limited activity within patient's tolerance;Repositioned;Premedicated before session;Monitored during session;Ice applied    Home Living                      Prior Function            PT Goals (current goals can now be found in the care plan section) Progress towards PT goals: Progressing toward goals    Frequency    7X/week      PT Plan Current plan remains appropriate    Co-evaluation              AM-PAC PT "6 Clicks" Daily Activity  Outcome Measure  Difficulty turning over in bed (including adjusting bedclothes, sheets and blankets)?: None Difficulty moving from lying on back to sitting on the side of the bed? : A Little Difficulty  sitting down on and standing up from a chair with arms (e.g., wheelchair, bedside commode, etc,.)?: A Little Help needed moving to and from a bed to chair (including a wheelchair)?: A Little Help needed walking in hospital room?: A Little Help needed climbing 3-5 steps with a railing? : A Little 6 Click Score: 19    End of Session Equipment Utilized During Treatment: Gait belt Activity Tolerance: Patient tolerated treatment well Patient left: with call bell/phone within reach;with family/visitor present;in chair Nurse Communication: Mobility status PT Visit Diagnosis: Other abnormalities of gait and mobility (R26.89)     Time: 6147-0929 PT Time Calculation (min) (ACUTE ONLY): 24 min  Charges:  $Gait Training: 8-22  mins $Therapeutic Exercise: 8-22 mins                    G Codes:      Carmelia Bake, PT, DPT 04/06/2017 Pager: [phone]   York Ram E 04/06/2017, 1:08 PM

## 2017-04-06 NOTE — Progress Notes (Signed)
   Subjective: 2 Days Post-Op Procedure(s) (LRB): RIGHT TOTAL KNEE ARTHROPLASTY (Right) Patient reports pain as moderate.   Patient seen in rounds with Dr. Wynelle Link. Patient is well, and has had no acute complaints or problems other than pain in the right knee and some frustration about pain. No SOB and chest pain. Voiding well. Positive flatus. Plan is to go Home after hospital stay.  Objective: Vital signs in last 24 hours: Temp:  [97.6 F (36.4 C)-98.1 F (36.7 C)] 98.1 F (36.7 C) (12/07 0451) Pulse Rate:  [75-105] 91 (12/07 0451) Resp:  [16-18] 16 (12/07 0451) BP: (104-155)/(59-76) 142/73 (12/07 0451) SpO2:  [92 %-95 %] 95 % (12/07 0451)  Intake/Output from previous day:  Intake/Output Summary (Last 24 hours) at 04/06/2017 0747 Last data filed at 04/06/2017 0451 Gross per 24 hour  Intake 1080 ml  Output 2250 ml  Net -1170 ml     Labs: Recent Labs    04/05/17 0534 04/06/17 0520  HGB 12.8* 12.1*   Recent Labs    04/05/17 0534 04/06/17 0520  WBC 9.9 8.9  RBC 4.01* 3.76*  HCT 38.6* 36.2*  PLT 230 225   Recent Labs    04/05/17 0534 04/06/17 0520  NA 136 138  K 4.1 3.6  CL 104 105  CO2 25 25  BUN 13 14  CREATININE 0.90 0.83  GLUCOSE 153* 139*  CALCIUM 8.7* 8.5*    EXAM General - Patient is Alert and Oriented; flat affect Extremity - Neurologically intact Intact pulses distally Dorsiflexion/Plantar flexion intact No cellulitis present Compartment soft Dressing/Incision - clean, dry, no drainage Motor Function - intact, moving foot and toes well on exam.   Past Medical History:  Diagnosis Date  . Anxiety   . Arthritis    back, hands, knees  . Colon polyp 05/09/2016   TUBULAR ADENOMAS   . Depression   . Diabetes mellitus without complication ([place])    type 2  . Ectatic abdominal aorta ([place]) 04/18/2016   2.9 cm on [place] 2016, unchanged 2017  . History of skin cancer    hand  . Hypertension   . Morbid obesity ([place]) 11/11/2014  . Obesity  11/11/2014  . Sleep apnea    cpap    Assessment/Plan: 2 Days Post-Op Procedure(s) (LRB): RIGHT TOTAL KNEE ARTHROPLASTY (Right) Active Problems:   Hx of total knee arthroplasty, right  Estimated body mass index is 42.42 kg/m as calculated from the following:   Height as of this encounter: 5\' 8"  (1.727 m).   Weight as of this encounter: 126.6 kg (279 lb). Advance diet Up with therapy Discharge home with home health  DVT Prophylaxis - Xarelto Weight-Bearing as tolerated   Will continue therapy this morning. Plan for DC home today. Will resume regular anti-depressants at home.   Ardeen Jourdain, PA-C Orthopaedic Surgery 04/06/2017, 7:47 AM

## 2017-04-07 DIAGNOSIS — E119 Type 2 diabetes mellitus without complications: Secondary | ICD-10-CM | POA: Diagnosis not present

## 2017-04-07 DIAGNOSIS — R69 Illness, unspecified: Secondary | ICD-10-CM | POA: Diagnosis not present

## 2017-04-07 DIAGNOSIS — E559 Vitamin D deficiency, unspecified: Secondary | ICD-10-CM | POA: Diagnosis not present

## 2017-04-07 DIAGNOSIS — I10 Essential (primary) hypertension: Secondary | ICD-10-CM | POA: Diagnosis not present

## 2017-04-07 DIAGNOSIS — Z96653 Presence of artificial knee joint, bilateral: Secondary | ICD-10-CM | POA: Diagnosis not present

## 2017-04-07 DIAGNOSIS — N138 Other obstructive and reflux uropathy: Secondary | ICD-10-CM | POA: Diagnosis not present

## 2017-04-07 DIAGNOSIS — M1990 Unspecified osteoarthritis, unspecified site: Secondary | ICD-10-CM | POA: Diagnosis not present

## 2017-04-07 DIAGNOSIS — N400 Enlarged prostate without lower urinary tract symptoms: Secondary | ICD-10-CM | POA: Diagnosis not present

## 2017-04-07 DIAGNOSIS — Z471 Aftercare following joint replacement surgery: Secondary | ICD-10-CM | POA: Diagnosis not present

## 2017-04-09 NOTE — Discharge Summary (Signed)
Physician Discharge Summary   Patient ID: Duane Hughes MRN: [id number] DOB/AGE: 1951-02-14 66 y.o.  Admit date: 04/04/2017 Discharge date: 04/06/2017  Primary Diagnosis: Primary osteoarthritis right knee  Admission Diagnoses:  Past Medical History:  Diagnosis Date  . Anxiety   . Arthritis    back, hands, knees  . Colon polyp 05/09/2016   TUBULAR ADENOMAS   . Depression   . Diabetes mellitus without complication ([place])    type 2  . Ectatic abdominal aorta ([place]) 04/18/2016   2.9 cm on [place] 2016, unchanged 2017  . History of skin cancer    hand  . Hypertension   . Morbid obesity ([place]) 11/11/2014  . Obesity 11/11/2014  . Sleep apnea    cpap   Discharge Diagnoses:   Active Problems:   Hx of total knee arthroplasty, right  Estimated body mass index is 42.42 kg/m as calculated from the following:   Height as of this encounter: '5\' 8"'$  (1.727 m).   Weight as of this encounter: 126.6 kg (279 lb).  Procedure:  Procedure(s) (LRB): RIGHT TOTAL KNEE ARTHROPLASTY (Right)   Consults: None  HPI: Duane Hughes, 66 y.o. male, has a history of pain and functional disability in the right knee due to arthritis and has failed non-surgical conservative treatments for greater than 12 weeks to includeNSAID's and/or analgesics, corticosteriod injections and activity modification.  Onset of symptoms was gradual, starting >10 years ago with gradually worsening course since that time. The patient noted prior procedures on the knee to include  arthroscopy and menisectomy on the right knee(s).  Patient currently rates pain in the right knee(s) at 8 out of 10 with activity. Patient has night pain, worsening of pain with activity and weight bearing, pain that interferes with activities of daily living, pain with passive range of motion, crepitus and joint swelling.  Patient has evidence of periarticular osteophytes and joint space narrowing by imaging studies. There is no active infection.    Laboratory  Data: Admission on 04/04/2017, Discharged on 04/06/2017  Component Date Value Ref Range Status  . Glucose-Capillary 04/04/2017 109* 65 - 99 mg/dL Final  . Glucose-Capillary 04/04/2017 135* 65 - 99 mg/dL Final  . Comment 1 04/04/2017 Notify RN   Final  . Comment 2 04/04/2017 Document in Chart   Final  . WBC 04/05/2017 9.9  4.0 - 10.5 K/uL Final  . RBC 04/05/2017 4.01* 4.22 - 5.81 MIL/uL Final  . Hemoglobin 04/05/2017 12.8* 13.0 - 17.0 g/dL Final  . HCT 04/05/2017 38.6* 39.0 - 52.0 % Final  . MCV 04/05/2017 96.3  78.0 - 100.0 fL Final  . MCH 04/05/2017 31.9  26.0 - 34.0 pg Final  . MCHC 04/05/2017 33.2  30.0 - 36.0 g/dL Final  . RDW 04/05/2017 12.3  11.5 - 15.5 % Final  . Platelets 04/05/2017 230  150 - 400 K/uL Final  . Sodium 04/05/2017 136  135 - 145 mmol/L Final  . Potassium 04/05/2017 4.1  3.5 - 5.1 mmol/L Final  . Chloride 04/05/2017 104  101 - 111 mmol/L Final  . CO2 04/05/2017 25  22 - 32 mmol/L Final  . Glucose, Bld 04/05/2017 153* 65 - 99 mg/dL Final  . BUN 04/05/2017 13  6 - 20 mg/dL Final  . Creatinine, Ser 04/05/2017 0.90  0.61 - 1.24 mg/dL Final  . Calcium 04/05/2017 8.7* 8.9 - 10.3 mg/dL Final  . GFR calc non Af Amer 04/05/2017 >60  >60 mL/min Final  . GFR calc Af Amer 04/05/2017 >60  >  60 mL/min Final   Comment: (NOTE) The eGFR has been calculated using the CKD EPI equation. This calculation has not been validated in all clinical situations. eGFR's persistently <60 mL/min signify possible Chronic Kidney Disease.   . Anion gap 04/05/2017 7  5 - 15 Final  . Glucose-Capillary 04/04/2017 171* 65 - 99 mg/dL Final  . Glucose-Capillary 04/04/2017 171* 65 - 99 mg/dL Final  . Glucose-Capillary 04/05/2017 124* 65 - 99 mg/dL Final  . Glucose-Capillary 04/05/2017 147* 65 - 99 mg/dL Final  . WBC 04/06/2017 8.9  4.0 - 10.5 K/uL Final  . RBC 04/06/2017 3.76* 4.22 - 5.81 MIL/uL Final  . Hemoglobin 04/06/2017 12.1* 13.0 - 17.0 g/dL Final  . HCT 04/06/2017 36.2* 39.0 - 52.0 % Final   . MCV 04/06/2017 96.3  78.0 - 100.0 fL Final  . MCH 04/06/2017 32.2  26.0 - 34.0 pg Final  . MCHC 04/06/2017 33.4  30.0 - 36.0 g/dL Final  . RDW 04/06/2017 12.6  11.5 - 15.5 % Final  . Platelets 04/06/2017 225  150 - 400 K/uL Final  . Sodium 04/06/2017 138  135 - 145 mmol/L Final  . Potassium 04/06/2017 3.6  3.5 - 5.1 mmol/L Final  . Chloride 04/06/2017 105  101 - 111 mmol/L Final  . CO2 04/06/2017 25  22 - 32 mmol/L Final  . Glucose, Bld 04/06/2017 139* 65 - 99 mg/dL Final  . BUN 04/06/2017 14  6 - 20 mg/dL Final  . Creatinine, Ser 04/06/2017 0.83  0.61 - 1.24 mg/dL Final  . Calcium 04/06/2017 8.5* 8.9 - 10.3 mg/dL Final  . GFR calc non Af Amer 04/06/2017 >60  >60 mL/min Final  . GFR calc Af Amer 04/06/2017 >60  >60 mL/min Final   Comment: (NOTE) The eGFR has been calculated using the CKD EPI equation. This calculation has not been validated in all clinical situations. eGFR's persistently <60 mL/min signify possible Chronic Kidney Disease.   . Anion gap 04/06/2017 8  5 - 15 Final  . Glucose-Capillary 04/05/2017 134* 65 - 99 mg/dL Final  . Glucose-Capillary 04/05/2017 181* 65 - 99 mg/dL Final  . Glucose-Capillary 04/06/2017 150* 65 - 99 mg/dL Final  Hospital Outpatient Visit on 03/29/2017  Component Date Value Ref Range Status  . aPTT 03/29/2017 29  24 - 36 seconds Final  . WBC 03/29/2017 6.0  4.0 - 10.5 K/uL Final  . RBC 03/29/2017 4.56  4.22 - 5.81 MIL/uL Final  . Hemoglobin 03/29/2017 14.8  13.0 - 17.0 g/dL Final  . HCT 03/29/2017 44.1  39.0 - 52.0 % Final  . MCV 03/29/2017 96.7  78.0 - 100.0 fL Final  . MCH 03/29/2017 32.5  26.0 - 34.0 pg Final  . MCHC 03/29/2017 33.6  30.0 - 36.0 g/dL Final  . RDW 03/29/2017 12.4  11.5 - 15.5 % Final  . Platelets 03/29/2017 257  150 - 400 K/uL Final  . Neutrophils Relative % 03/29/2017 65  % Final  . Neutro Abs 03/29/2017 3.9  1.7 - 7.7 K/uL Final  . Lymphocytes Relative 03/29/2017 23  % Final  . Lymphs Abs 03/29/2017 1.4  0.7 - 4.0  K/uL Final  . Monocytes Relative 03/29/2017 7  % Final  . Monocytes Absolute 03/29/2017 0.4  0.1 - 1.0 K/uL Final  . Eosinophils Relative 03/29/2017 4  % Final  . Eosinophils Absolute 03/29/2017 0.3  0.0 - 0.7 K/uL Final  . Basophils Relative 03/29/2017 1  % Final  . Basophils Absolute 03/29/2017 0.0  0.0 -  0.1 K/uL Final  . Prothrombin Time 03/29/2017 12.9  11.4 - 15.2 seconds Final  . INR 03/29/2017 0.98   Final  . ABO/RH(D) 03/29/2017 O POS   Final  . Antibody Screen 03/29/2017 NEG   Final  . Sample Expiration 03/29/2017 04/07/2017   Final  . Extend sample reason 03/29/2017 NO TRANSFUSIONS OR PREGNANCY IN THE PAST 3 MONTHS   Final  . Color, Urine 03/29/2017 YELLOW  YELLOW Final  . APPearance 03/29/2017 CLEAR  CLEAR Final  . Specific Gravity, Urine 03/29/2017 1.030  1.005 - 1.030 Final  . pH 03/29/2017 5.0  5.0 - 8.0 Final  . Glucose, UA 03/29/2017 NEGATIVE  NEGATIVE mg/dL Final  . Hgb urine dipstick 03/29/2017 NEGATIVE  NEGATIVE Final  . Bilirubin Urine 03/29/2017 NEGATIVE  NEGATIVE Final  . Ketones, ur 03/29/2017 NEGATIVE  NEGATIVE mg/dL Final  . Protein, ur 03/29/2017 NEGATIVE  NEGATIVE mg/dL Final  . Nitrite 03/29/2017 NEGATIVE  NEGATIVE Final  . Leukocytes, UA 03/29/2017 NEGATIVE  NEGATIVE Final  . MRSA, PCR 03/29/2017 NEGATIVE  NEGATIVE Final  . Staphylococcus aureus 03/29/2017 NEGATIVE  NEGATIVE Final   Comment: (NOTE) The Xpert SA Assay (FDA approved for NASAL specimens in patients 61 years of age and older), is one component of a comprehensive surveillance program. It is not intended to diagnose infection nor to guide or monitor treatment.   . ABO/RH(D) 03/29/2017 O POS   Final  Office Visit on 03/08/2017  Component Date Value Ref Range Status  . Hemoglobin A1C 03/08/2017 6.6   Final  . Cholesterol 03/09/2017 135  <200 mg/dL Final  . HDL 03/09/2017 37* >40 mg/dL Final  . Triglycerides 03/09/2017 159* <150 mg/dL Final  . LDL Cholesterol (Calc) 03/09/2017 74  mg/dL  (calc) Final   Comment: Reference range: <100 . Desirable range <100 mg/dL for primary prevention;   <70 mg/dL for patients with CHD or diabetic patients  with > or = 2 CHD risk factors. Marland Kitchen LDL-C is now calculated using the Martin-Hopkins  calculation, which is a validated novel method providing  better accuracy than the Friedewald equation in the  estimation of LDL-C.  Cresenciano Genre et al. Annamaria Helling. 0174;944(96): 2061-2068  (http://education.[employer].com/faq/FAQ164)   . Total CHOL/HDL Ratio 03/09/2017 3.6  <5.0 (calc) Final  . Non-HDL Cholesterol (Calc) 03/09/2017 98  <130 mg/dL (calc) Final   Comment: For patients with diabetes plus 1 major ASCVD risk  factor, treating to a non-HDL-C goal of <100 mg/dL  (LDL-C of <70 mg/dL) is considered a therapeutic  option.   . Magnesium 03/08/2017 2.2  1.5 - 2.5 mg/dL Final  . Glucose, Bld 03/08/2017 176* 65 - 139 mg/dL Final   Comment: .        Non-fasting reference interval .   . BUN 03/08/2017 16  7 - 25 mg/dL Final  . Creat 03/08/2017 0.85  0.70 - 1.25 mg/dL Final   Comment: For patients >48 years of age, the reference limit for Creatinine is approximately 13% higher for people identified as African-American. .   . GFR, Est Non African American 03/08/2017 90  > OR = 60 mL/min/1.34m Final  . GFR, Est African American 03/08/2017 104  > OR = 60 mL/min/1.761mFinal  . BUN/Creatinine Ratio 1175/91/6384OT APPLICABLE  6 - 22 (calc) Final  . Sodium 03/08/2017 138  135 - 146 mmol/L Final  . Potassium 03/08/2017 4.3  3.5 - 5.3 mmol/L Final  . Chloride 03/08/2017 105  98 - 110 mmol/L Final  . CO2 03/08/2017 26  20 - 32 mmol/L Final  . Calcium 03/08/2017 8.5* 8.6 - 10.3 mg/dL Final  . Total Protein 03/08/2017 5.7* 6.1 - 8.1 g/dL Final  . Albumin 03/08/2017 3.7  3.6 - 5.1 g/dL Final  . Globulin 03/08/2017 2.0  1.9 - 3.7 g/dL (calc) Final  . AG Ratio 03/08/2017 1.9  1.0 - 2.5 (calc) Final  . Total Bilirubin 03/08/2017 0.3  0.2 - 1.2 mg/dL  Final  . Alkaline phosphatase (APISO) 03/08/2017 47  40 - 115 U/L Final  . AST 03/08/2017 33  10 - 35 U/L Final  . ALT 03/08/2017 39  9 - 46 U/L Final  Office Visit on 03/01/2017  Component Date Value Ref Range Status  . Prostate Specific Ag, Serum 03/01/2017 0.2  0.0 - 4.0 ng/mL Final   Comment: Roche ECLIA methodology. According to the American Urological Association, Serum PSA should decrease and remain at undetectable levels after radical prostatectomy. The AUA defines biochemical recurrence as an initial PSA value 0.2 ng/mL or greater followed by a subsequent confirmatory PSA value 0.2 ng/mL or greater. Values obtained with different assay methods or kits cannot be used interchangeably. Results cannot be interpreted as absolute evidence of the presence or absence of malignant disease.       Hospital Course: Duane Hughes is a 66 y.o. who was admitted to [facility]. They were brought to the operating room on 04/04/2017 and underwent Procedure(s): RIGHT TOTAL KNEE ARTHROPLASTY.  Patient tolerated the procedure well and was later transferred to the recovery room and then to the orthopaedic floor for postoperative care.  They were given PO and IV analgesics for pain control following their surgery.  They were given 24 hours of postoperative antibiotics of  Anti-infectives (From admission, onward)   Start     Dose/Rate Route Frequency Ordered Stop   04/04/17 1800  ceFAZolin (ANCEF) IVPB 1 g/50 mL premix     1 g 100 mL/hr over 30 Minutes Intravenous Every 6 hours 04/04/17 1609 04/04/17 2350   04/04/17 1235  polymyxin B 500,000 Units, bacitracin 50,000 Units in sodium chloride 0.9 % 500 mL irrigation  Status:  Discontinued       As needed 04/04/17 1235 04/04/17 1355   04/04/17 0600  ceFAZolin (ANCEF) 3 g in dextrose 5 % 50 mL IVPB     3 g 130 mL/hr over 30 Minutes Intravenous On call to O.R. 04/03/17 1158 04/04/17 1159     and started on DVT prophylaxis in the form of  Xarelto.   PT and OT were ordered for total joint protocol.  Discharge planning consulted to help with postop disposition and equipment needs.  Patient had a fair night on the evening of surgery.  They started to get up OOB with therapy on day one. Had some issues with hypotension with therapy but responded with fluids. Hemovac drain was pulled without difficulty.  Continued to work with therapy into day two.  Dressing was changed on day two and the incision was clean and dry. The patient had progressed with therapy and meeting their goals.  Incision was healing well.  Patient was seen in rounds and was ready to go home.   Diet: Cardiac diet and diabetic diet Activity:WBAT Follow-up:in 2 weeks Disposition - Home Discharged Condition: stable   Discharge Instructions    Call MD / Call 911   Complete by:  As directed    If you experience chest pain or shortness of breath, CALL 911 and be transported to the  hospital emergency room.  If you develope a fever above 101 F, pus (white drainage) or increased drainage or redness at the wound, or calf pain, call your surgeon's office.   Constipation Prevention   Complete by:  As directed    Drink plenty of fluids.  Prune juice may be helpful.  You may use a stool softener, such as Colace (over the counter) 100 mg twice a day.  Use MiraLax (over the counter) for constipation as needed.   Diet - low sodium heart healthy   Complete by:  As directed    Diet Carb Modified   Complete by:  As directed    Discharge instructions   Complete by:  As directed    INSTRUCTIONS AFTER JOINT REPLACEMENT   Remove items at home which could result in a fall. This includes throw rugs or furniture in walking pathways ICE to the affected joint every three hours while awake for 30 minutes at a time, for at least the first 3-5 days, and then as needed for pain and swelling.  Continue to use ice for pain and swelling. You may notice swelling that will progress down to the foot  and ankle.  This is normal after surgery.  Elevate your leg when you are not up walking on it.   Continue to use the breathing machine you got in the hospital (incentive spirometer) which will help keep your temperature down.  It is common for your temperature to cycle up and down following surgery, especially at night when you are not up moving around and exerting yourself.  The breathing machine keeps your lungs expanded and your temperature down.   DIET:  As you were doing prior to hospitalization, we recommend a well-balanced diet.  DRESSING / WOUND CARE / SHOWERING  You may change your dressing every day with sterile gauze.  Please use good hand washing techniques before changing the dressing.  Do not use any lotions or creams on the incision until instructed by your surgeon.  ACTIVITY  Increase activity slowly as tolerated, but follow the weight bearing instructions below.   No driving for 6 weeks or until further direction given by your physician.  You cannot drive while taking narcotics.  No lifting or carrying greater than 10 lbs. until further directed by your surgeon. Avoid periods of inactivity such as sitting longer than an hour when not asleep. This helps prevent blood clots.  You may return to work once you are authorized by your doctor.     WEIGHT BEARING   Weight bearing as tolerated with assist device (walker, cane, etc) as directed, use it as long as suggested by your surgeon or therapist, typically at least 4-6 weeks.   EXERCISES  Results after joint replacement surgery are often greatly improved when you follow the exercise, range of motion and muscle strengthening exercises prescribed by your doctor. Safety measures are also important to protect the joint from further injury. Any time any of these exercises cause you to have increased pain or swelling, decrease what you are doing until you are comfortable again and then slowly increase them. If you have problems or  questions, call your caregiver or physical therapist for advice.   Rehabilitation is important following a joint replacement. After just a few days of immobilization, the muscles of the leg can become weakened and shrink (atrophy).  These exercises are designed to build up the tone and strength of the thigh and leg muscles and to improve motion. Often times  heat used for twenty to thirty minutes before working out will loosen up your tissues and help with improving the range of motion but do not use heat for the first two weeks following surgery (sometimes heat can increase post-operative swelling).   These exercises can be done on a training (exercise) mat, on the floor, on a table or on a bed. Use whatever works the best and is most comfortable for you.    Use music or television while you are exercising so that the exercises are a pleasant break in your day. This will make your life better with the exercises acting as a break in your routine that you can look forward to.   Perform all exercises about fifteen times, three times per day or as directed.  You should exercise both the operative leg and the other leg as well.  Exercises include:   Quad Sets - Tighten up the muscle on the front of the thigh (Quad) and hold for 5-10 seconds.   Straight Leg Raises - With your knee straight (if you were given a brace, keep it on), lift the leg to 60 degrees, hold for 3 seconds, and slowly lower the leg.  Perform this exercise against resistance later as your leg gets stronger.  Leg Slides: Lying on your back, slowly slide your foot toward your buttocks, bending your knee up off the floor (only go as far as is comfortable). Then slowly slide your foot back down until your leg is flat on the floor again.  Angel Wings: Lying on your back spread your legs to the side as far apart as you can without causing discomfort.  Hamstring Strength:  Lying on your back, push your heel against the floor with your leg straight  by tightening up the muscles of your buttocks.  Repeat, but this time bend your knee to a comfortable angle, and push your heel against the floor.  You may put a pillow under the heel to make it more comfortable if necessary.   A rehabilitation program following joint replacement surgery can speed recovery and prevent re-injury in the future due to weakened muscles. Contact your doctor or a physical therapist for more information on knee rehabilitation.    CONSTIPATION  Constipation is defined medically as fewer than three stools per week and severe constipation as less than one stool per week.  Even if you have a regular bowel pattern at home, your normal regimen is likely to be disrupted due to multiple reasons following surgery.  Combination of anesthesia, postoperative narcotics, change in appetite and fluid intake all can affect your bowels.   YOU MUST use at least one of the following options; they are listed in order of increasing strength to get the job done.  They are all available over the counter, and you may need to use some, POSSIBLY even all of these options:    Drink plenty of fluids (prune juice may be helpful) and high fiber foods Colace 100 mg by mouth twice a day  Senokot for constipation as directed and as needed Dulcolax (bisacodyl), take with full glass of water  Miralax (polyethylene glycol) once or twice a day as needed.  If you have tried all these things and are unable to have a bowel movement in the first 3-4 days after surgery call either your surgeon or your primary doctor.    If you experience loose stools or diarrhea, hold the medications until you stool forms back up.  If your symptoms  do not get better within 1 week or if they get worse, check with your doctor.  If you experience "the worst abdominal pain ever" or develop nausea or vomiting, please contact the office immediately for further recommendations for treatment.   ITCHING:  If you experience itching with  your medications, try taking only a single pain pill, or even half a pain pill at a time.  You can also use Benadryl over the counter for itching or also to help with sleep.   TED HOSE STOCKINGS:  Use stockings on both legs until for at least 2 weeks or as directed by physician office. They may be removed at night for sleeping.  MEDICATIONS:  See your medication summary on the "After Visit Summary" that nursing will review with you.  You may have some home medications which will be placed on hold until you complete the course of blood thinner medication.  It is important for you to complete the blood thinner medication as prescribed.  PRECAUTIONS:  If you experience chest pain or shortness of breath - call 911 immediately for transfer to the hospital emergency department.   If you develop a fever greater that 101 F, purulent drainage from wound, increased redness or drainage from wound, foul odor from the wound/dressing, or calf pain - CONTACT YOUR SURGEON.                                                   FOLLOW-UP APPOINTMENTS:  If you do not already have a post-op appointment, please call the office for an appointment to be seen by your surgeon.  Guidelines for how soon to be seen are listed in your "After Visit Summary", but are typically between 1-4 weeks after surgery.   MAKE SURE YOU:  Understand these instructions.  Get help right away if you are not doing well or get worse.    Thank you for letting us be a part of your medical care team.  It is a privilege we respect greatly.  We hope these instructions will help you stay on track for a fast and full recovery!   Increase activity slowly as tolerated   Complete by:  As directed      Allergies as of 04/06/2017   No Known Allergies     Medication List    STOP taking these medications   diphenhydramine-acetaminophen 25-500 MG Tabs tablet Commonly known as:  TYLENOL PM     TAKE these medications   aspirin EC 325 MG tablet Take  1 tablet (325 mg total) by mouth 2 (two) times daily. What changed:    medication strength  how much to take  when to take this   atorvastatin 10 MG tablet Commonly known as:  LIPITOR TAKE 1 TABLET BY MOUTH EVERY NIGHT AT BEDTIME What changed:    how much to take  how to take this  when to take this   buPROPion 150 MG 12 hr tablet Commonly known as:  WELLBUTRIN SR TAKE 1 TABLET BY MOUTH TWICE A DAY What changed:    how much to take  how to take this  when to take this   EQL VITAMIN D3 2000 units Caps Generic drug:  Cholecalciferol Take 2,000 Units by mouth every other day.   lamoTRIgine 150 MG tablet Commonly known as:  LAMICTAL Take 150 mg  by mouth daily.   lisinopril 5 MG tablet Commonly known as:  PRINIVIL,ZESTRIL TAKE 1/2 TABLET BY MOUTH DAILY. What changed:    how much to take  how to take this  when to take this   magnesium oxide 400 MG tablet Commonly known as:  MAG-OX Take 400 mg by mouth daily.   metFORMIN 500 MG tablet Commonly known as:  GLUCOPHAGE TAKE 1 TABLET BY MOUTH 2 TIMES DAILY WITH A MEAL. What changed:  See the new instructions.   methocarbamol 500 MG tablet Commonly known as:  ROBAXIN Take 1 tablet (500 mg total) by mouth every 6 (six) hours as needed for muscle spasms.   MOBIC 15 MG tablet Generic drug:  meloxicam Take 15 mg by mouth once daily   MULTIVITAMIN ADULT PO Take 1 tablet by mouth daily.   oxyCODONE-acetaminophen 5-325 MG tablet Commonly known as:  PERCOCET/ROXICET Take 1-2 tablets by mouth every 6 (six) hours as needed for severe pain. What changed:  See the new instructions.   sildenafil 20 MG tablet Commonly known as:  REVATIO Take 3 to 5 tablets two hours before intercouse on an empty stomach.  Do not take with nitrates. What changed:    how much to take  how to take this  when to take this  reasons to take this  additional instructions   tamsulosin 0.4 MG Caps capsule Commonly known as:   FLOMAX Take 1 capsule (0.4 mg total) by mouth daily.   Turmeric 500 MG Caps Take 500 mg by mouth daily.      [facility] Follow up.   Why:  bedside commode Contact information: [address] [address] [place] Follow up.   Specialty:  [place] Why:  physical therapy Contact information: [place] [address] [phone]        Latanya Maudlin, MD. Schedule an appointment as soon as possible for a visit in 2 week(s).   Specialty:  Orthopedic Surgery Contact information: [address] [place] [address] [phone]           Signed: Ardeen Jourdain, PA-C Orthopaedic Surgery 04/09/2017, 10:44 AM

## 2017-04-10 DIAGNOSIS — E119 Type 2 diabetes mellitus without complications: Secondary | ICD-10-CM | POA: Diagnosis not present

## 2017-04-10 DIAGNOSIS — R69 Illness, unspecified: Secondary | ICD-10-CM | POA: Diagnosis not present

## 2017-04-10 DIAGNOSIS — N138 Other obstructive and reflux uropathy: Secondary | ICD-10-CM | POA: Diagnosis not present

## 2017-04-10 DIAGNOSIS — Z471 Aftercare following joint replacement surgery: Secondary | ICD-10-CM | POA: Diagnosis not present

## 2017-04-10 DIAGNOSIS — Z96653 Presence of artificial knee joint, bilateral: Secondary | ICD-10-CM | POA: Diagnosis not present

## 2017-04-10 DIAGNOSIS — M1990 Unspecified osteoarthritis, unspecified site: Secondary | ICD-10-CM | POA: Diagnosis not present

## 2017-04-10 DIAGNOSIS — I10 Essential (primary) hypertension: Secondary | ICD-10-CM | POA: Diagnosis not present

## 2017-04-10 DIAGNOSIS — E559 Vitamin D deficiency, unspecified: Secondary | ICD-10-CM | POA: Diagnosis not present

## 2017-04-10 DIAGNOSIS — N400 Enlarged prostate without lower urinary tract symptoms: Secondary | ICD-10-CM | POA: Diagnosis not present

## 2017-04-11 DIAGNOSIS — R69 Illness, unspecified: Secondary | ICD-10-CM | POA: Diagnosis not present

## 2017-04-11 DIAGNOSIS — E559 Vitamin D deficiency, unspecified: Secondary | ICD-10-CM | POA: Diagnosis not present

## 2017-04-11 DIAGNOSIS — N400 Enlarged prostate without lower urinary tract symptoms: Secondary | ICD-10-CM | POA: Diagnosis not present

## 2017-04-11 DIAGNOSIS — I10 Essential (primary) hypertension: Secondary | ICD-10-CM | POA: Diagnosis not present

## 2017-04-11 DIAGNOSIS — N138 Other obstructive and reflux uropathy: Secondary | ICD-10-CM | POA: Diagnosis not present

## 2017-04-11 DIAGNOSIS — Z96653 Presence of artificial knee joint, bilateral: Secondary | ICD-10-CM | POA: Diagnosis not present

## 2017-04-11 DIAGNOSIS — E119 Type 2 diabetes mellitus without complications: Secondary | ICD-10-CM | POA: Diagnosis not present

## 2017-04-11 DIAGNOSIS — Z471 Aftercare following joint replacement surgery: Secondary | ICD-10-CM | POA: Diagnosis not present

## 2017-04-11 DIAGNOSIS — M1990 Unspecified osteoarthritis, unspecified site: Secondary | ICD-10-CM | POA: Diagnosis not present

## 2017-04-13 DIAGNOSIS — R69 Illness, unspecified: Secondary | ICD-10-CM | POA: Diagnosis not present

## 2017-04-13 DIAGNOSIS — E119 Type 2 diabetes mellitus without complications: Secondary | ICD-10-CM | POA: Diagnosis not present

## 2017-04-13 DIAGNOSIS — E559 Vitamin D deficiency, unspecified: Secondary | ICD-10-CM | POA: Diagnosis not present

## 2017-04-13 DIAGNOSIS — M1990 Unspecified osteoarthritis, unspecified site: Secondary | ICD-10-CM | POA: Diagnosis not present

## 2017-04-13 DIAGNOSIS — Z96653 Presence of artificial knee joint, bilateral: Secondary | ICD-10-CM | POA: Diagnosis not present

## 2017-04-13 DIAGNOSIS — I10 Essential (primary) hypertension: Secondary | ICD-10-CM | POA: Diagnosis not present

## 2017-04-13 DIAGNOSIS — N138 Other obstructive and reflux uropathy: Secondary | ICD-10-CM | POA: Diagnosis not present

## 2017-04-13 DIAGNOSIS — Z471 Aftercare following joint replacement surgery: Secondary | ICD-10-CM | POA: Diagnosis not present

## 2017-04-13 DIAGNOSIS — N400 Enlarged prostate without lower urinary tract symptoms: Secondary | ICD-10-CM | POA: Diagnosis not present

## 2017-04-16 DIAGNOSIS — Z96653 Presence of artificial knee joint, bilateral: Secondary | ICD-10-CM | POA: Diagnosis not present

## 2017-04-16 DIAGNOSIS — R69 Illness, unspecified: Secondary | ICD-10-CM | POA: Diagnosis not present

## 2017-04-16 DIAGNOSIS — N138 Other obstructive and reflux uropathy: Secondary | ICD-10-CM | POA: Diagnosis not present

## 2017-04-16 DIAGNOSIS — E559 Vitamin D deficiency, unspecified: Secondary | ICD-10-CM | POA: Diagnosis not present

## 2017-04-16 DIAGNOSIS — M1990 Unspecified osteoarthritis, unspecified site: Secondary | ICD-10-CM | POA: Diagnosis not present

## 2017-04-16 DIAGNOSIS — Z471 Aftercare following joint replacement surgery: Secondary | ICD-10-CM | POA: Diagnosis not present

## 2017-04-16 DIAGNOSIS — I10 Essential (primary) hypertension: Secondary | ICD-10-CM | POA: Diagnosis not present

## 2017-04-16 DIAGNOSIS — N400 Enlarged prostate without lower urinary tract symptoms: Secondary | ICD-10-CM | POA: Diagnosis not present

## 2017-04-16 DIAGNOSIS — E119 Type 2 diabetes mellitus without complications: Secondary | ICD-10-CM | POA: Diagnosis not present

## 2017-04-17 DIAGNOSIS — Z96651 Presence of right artificial knee joint: Secondary | ICD-10-CM | POA: Diagnosis not present

## 2017-04-17 DIAGNOSIS — Z471 Aftercare following joint replacement surgery: Secondary | ICD-10-CM | POA: Diagnosis not present

## 2017-04-18 DIAGNOSIS — Z471 Aftercare following joint replacement surgery: Secondary | ICD-10-CM | POA: Diagnosis not present

## 2017-04-18 DIAGNOSIS — M1990 Unspecified osteoarthritis, unspecified site: Secondary | ICD-10-CM | POA: Diagnosis not present

## 2017-04-18 DIAGNOSIS — E119 Type 2 diabetes mellitus without complications: Secondary | ICD-10-CM | POA: Diagnosis not present

## 2017-04-18 DIAGNOSIS — I10 Essential (primary) hypertension: Secondary | ICD-10-CM | POA: Diagnosis not present

## 2017-04-18 DIAGNOSIS — E559 Vitamin D deficiency, unspecified: Secondary | ICD-10-CM | POA: Diagnosis not present

## 2017-04-18 DIAGNOSIS — N400 Enlarged prostate without lower urinary tract symptoms: Secondary | ICD-10-CM | POA: Diagnosis not present

## 2017-04-18 DIAGNOSIS — N138 Other obstructive and reflux uropathy: Secondary | ICD-10-CM | POA: Diagnosis not present

## 2017-04-18 DIAGNOSIS — R69 Illness, unspecified: Secondary | ICD-10-CM | POA: Diagnosis not present

## 2017-04-18 DIAGNOSIS — Z96653 Presence of artificial knee joint, bilateral: Secondary | ICD-10-CM | POA: Diagnosis not present

## 2017-04-27 ENCOUNTER — Other Ambulatory Visit: Payer: Self-pay | Admitting: Family Medicine

## 2017-04-27 NOTE — Telephone Encounter (Signed)
Dec 2018 Cr 0.83; Rx approved

## 2017-04-30 DIAGNOSIS — Z96651 Presence of right artificial knee joint: Secondary | ICD-10-CM | POA: Diagnosis not present

## 2017-05-03 ENCOUNTER — Ambulatory Visit (INDEPENDENT_AMBULATORY_CARE_PROVIDER_SITE_OTHER): Payer: Medicare HMO | Admitting: Family Medicine

## 2017-05-03 ENCOUNTER — Encounter: Payer: Self-pay | Admitting: Family Medicine

## 2017-05-03 VITALS — BP 122/78 | HR 85 | Temp 97.9°F | Resp 14 | Wt 262.2 lb

## 2017-05-03 DIAGNOSIS — Z5181 Encounter for therapeutic drug level monitoring: Secondary | ICD-10-CM

## 2017-05-03 DIAGNOSIS — R590 Localized enlarged lymph nodes: Secondary | ICD-10-CM

## 2017-05-03 DIAGNOSIS — D649 Anemia, unspecified: Secondary | ICD-10-CM

## 2017-05-03 DIAGNOSIS — R634 Abnormal weight loss: Secondary | ICD-10-CM

## 2017-05-03 DIAGNOSIS — E559 Vitamin D deficiency, unspecified: Secondary | ICD-10-CM | POA: Diagnosis not present

## 2017-05-03 DIAGNOSIS — G2581 Restless legs syndrome: Secondary | ICD-10-CM

## 2017-05-03 DIAGNOSIS — R5383 Other fatigue: Secondary | ICD-10-CM

## 2017-05-03 NOTE — Patient Instructions (Addendum)
Please do the stool cards and return them at your earliest convenience Let's get labs today If your symptoms worsen, please either call or seek medical attention right away based on severity

## 2017-05-03 NOTE — Assessment & Plan Note (Signed)
Check labs 

## 2017-05-03 NOTE — Assessment & Plan Note (Signed)
No progression per patient

## 2017-05-03 NOTE — Progress Notes (Signed)
BP 122/78   Pulse 85   Temp 97.9 F (36.6 C) (Oral)   Resp 14   Wt 262 lb 3.2 oz (118.9 kg)   SpO2 97%   BMI 39.87 kg/m    Subjective:    Patient ID: Duane Hughes, male    DOB: Dec 24, 1950, 67 y.o.   MRN: [id number]  HPI: Duane Hughes is a 67 y.o. male  Chief Complaint  Patient presents with  . Fatigue    weakness  . Insomnia    patient states has trouble going to sleep and has restless legs    HPI He thinks he got a cold on Friday; felt sick and rough over the weekend; his therapist took it easy on him Fatigue started on Friday too; didn't feel like getting out of bed at all [facility] shopping and can't walk very long; probably because of the knee and the weakness He thought it was the oxycodone, but not taking any of that lately; last pain pill was an hour before PT on Monday (today is Thursday); just a few a week now For a few hours, he feels okay and then voom, right back down again He had a cough when he had the cold, but that was it; not coughing now No fevers Traveled to [place], but not out of the country Moving bowels okay; not sluggish or constipated; not every day, but working it out Not sure about blood loss in the hospital  FSBS were 134 to 181 in the hospital H/H dropped 14.8 to 12.1 after surgery Tired, no SHOB; no chest pain Lost weight, not intentional, then says "yes" No sick contacts When he got the cold, he took Dayquil and Nyquil for a few days and that stopped It never really turned green, just clear Lost 17 pounds in rehab Something is different in there, pointing to his belly; no real pain, something just off, a little queasy, then goes away; feed helps a little No dark stools or blood in stool Restless legs are driving him nuts He has had it periodically over the last few years; very spotty, then really got bad over the last few days Pain in left shoulder when sleeping, then rolls to right side, the left arm will start to ache; mostly like  a pulled muscle over a certain spot Not taking tamsulosin; little bit of prostate issue but worth better balance Vit D deficiency in July; taking 2,000 iu daily [phone] cell  Depression screen [facility] 2/9 05/03/2017 03/08/2017 02/20/2017 12/07/2016 11/16/2016  Decreased Interest 0 2 0 3 1  Down, Depressed, Hopeless 0 2 0 3 3  PHQ - 2 Score 0 4 0 6 4  Altered sleeping - 0 - 1 2  Tired, decreased energy - 3 - 3 3  Change in appetite - 0 - 0 1  Feeling bad or failure about yourself  - 0 - 1 1  Trouble concentrating - - - 1 1  Moving slowly or fidgety/restless - 0 - 0 0  Suicidal thoughts - 0 - 1 1  PHQ-9 Score - 7 - 13 13  Difficult doing work/chores - Not difficult at all - Somewhat difficult Somewhat difficult    Relevant past medical, surgical, family and social history reviewed Past Medical History:  Diagnosis Date  . Anxiety   . Arthritis    back, hands, knees  . Colon polyp 05/09/2016   TUBULAR ADENOMAS   . Depression   . Diabetes mellitus without complication ([place])  type 2  . Ectatic abdominal aorta ([place]) 04/18/2016   2.9 cm on [place] 2016, unchanged 2017  . History of skin cancer    hand  . Hypertension   . Morbid obesity ([place]) 11/11/2014  . Obesity 11/11/2014  . Sleep apnea    cpap   Past Surgical History:  Procedure Laterality Date  . COLONOSCOPY WITH PROPOFOL N/A 05/09/2016   Procedure: COLONOSCOPY WITH PROPOFOL;  Surgeon: Lucilla Lame, MD;  Location: [facility] ENDOSCOPY;  Service: Endoscopy;  Laterality: N/A;  . HERNIA REPAIR    . JOINT REPLACEMENT     04-04-17 Dr. Gladstone Lighter   Right knee  . KNEE ARTHROSCOPY     several scopes done  . MEDIAL PARTIAL KNEE REPLACEMENT Left    2000s?  Marland Kitchen SKIN CANCER EXCISION  Feb 2017   removed from right hand  . 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  Alcohol abuse Mother   . Rheum arthritis Mother   . Stroke Maternal Grandmother   . Stroke Paternal Grandfather   . Dementia Paternal Grandmother   . Cancer Neg Hx   . Diabetes Neg Hx   . Heart disease Neg Hx   . Hypertension Neg Hx   . Kidney disease Neg Hx   . Prostate cancer Neg Hx    Social History   Tobacco Use  . Smoking status: Never Smoker  . Smokeless tobacco: Never Used  Substance Use Topics  . Alcohol use: No    Alcohol/week: 0.0 oz  . Drug use: No    Interim medical history since last visit reviewed. Allergies and medications reviewed  Review of Systems Per HPI unless specifically indicated above     Objective:    BP 122/78   Pulse 85   Temp 97.9 F (36.6 C) (Oral)   Resp 14   Wt 262 lb 3.2 oz (118.9 kg)   SpO2 97%   BMI 39.87 kg/m   Wt Readings from Last 3 Encounters:  05/03/17 262 lb 3.2 oz (118.9 kg)  04/04/17 279 lb (126.6 kg)  03/29/17 279 lb (126.6 kg)    Physical Exam  Constitutional: He appears well-developed and well-nourished. No distress.  Weight loss noted  HENT:  Head: Normocephalic and atraumatic.  Eyes: EOM are normal. No scleral icterus.  Neck: No thyromegaly present.  Cardiovascular: Normal rate and regular rhythm.  Pulmonary/Chest: Effort normal and breath sounds normal.  Abdominal: Soft. Bowel sounds are normal. He exhibits no distension.  Musculoskeletal: He exhibits no edema.       Right knee: He exhibits no swelling, no effusion and no erythema.  Surgical scar over anterior RIGHT knee; no erythema  Neurological: Coordination normal.  Skin: Skin is warm and dry. No pallor.  Psychiatric: He has a normal mood and affect. His behavior is normal. Judgment and thought content normal.   Diabetic Foot Form - Detailed   Diabetic Foot Exam - detailed Diabetic Foot exam was performed with the following findings:  Yes 05/07/2017  1:23 PM  Visual Foot Exam completed.:  Yes  Pulse Foot Exam completed.:  Yes  Right Dorsalis Pedis:   Present Left Dorsalis Pedis:  Present  Sensory Foot Exam Completed.:  Yes Semmes-Weinstein Monofilament Test R Site 1-Great Toe:  Pos L Site 1-Great Toe:  Pos  Assessment & Plan:   Problem List Items Addressed This Visit      Immune and Lymphatic   Lymphadenopathy, axillary    No progression per patient      Relevant Orders   CBC with Differential/Platelet (Completed)   Fe+TIBC+Fer (Completed)     Other   Vitamin D deficiency    Check vit D      Relevant Orders   VITAMIN D 25 Hydroxy (Vit-D Deficiency, Fractures) (Completed)   Medication monitoring encounter    Check labs       Other Visit Diagnoses    Fatigue, unspecified type    -  Primary   Relevant Orders   Fe+TIBC+Fer (Completed)   Weight loss       Relevant Orders   CBC with Differential/Platelet (Completed)   Fe+TIBC+Fer (Completed)   COMPLETE METABOLIC PANEL WITH GFR (Completed)   TSH (Completed)   Restless leg syndrome       Relevant Orders   CBC with Differential/Platelet (Completed)   Fe+TIBC+Fer (Completed)   Anemia, unspecified type           Follow up plan: No Follow-up on file.  An after-visit summary was printed and given to the patient at [facility].  Please see the patient instructions which may contain other information and recommendations beyond what is mentioned above in the assessment and plan.  No orders of the defined types were placed in this encounter.   Orders Placed This Encounter  Procedures  . CBC with Differential/Platelet  . Fe+TIBC+Fer  . COMPLETE METABOLIC PANEL WITH GFR  . TSH  . VITAMIN D 25 Hydroxy (Vit-D Deficiency, Fractures)

## 2017-05-03 NOTE — Assessment & Plan Note (Signed)
Check vit D 

## 2017-05-04 ENCOUNTER — Other Ambulatory Visit: Payer: Self-pay | Admitting: Family Medicine

## 2017-05-04 DIAGNOSIS — I517 Cardiomegaly: Secondary | ICD-10-CM

## 2017-05-04 DIAGNOSIS — Z96651 Presence of right artificial knee joint: Secondary | ICD-10-CM | POA: Diagnosis not present

## 2017-05-04 DIAGNOSIS — R5383 Other fatigue: Secondary | ICD-10-CM

## 2017-05-04 LAB — COMPLETE METABOLIC PANEL WITH GFR
AG Ratio: 1.6 (calc) (ref 1.0–2.5)
ALBUMIN MSPROF: 4.1 g/dL (ref 3.6–5.1)
ALKALINE PHOSPHATASE (APISO): 86 U/L (ref 40–115)
ALT: 27 U/L (ref 9–46)
AST: 24 U/L (ref 10–35)
BUN: 17 mg/dL (ref 7–25)
CALCIUM: 9.2 mg/dL (ref 8.6–10.3)
CO2: 26 mmol/L (ref 20–32)
CREATININE: 0.98 mg/dL (ref 0.70–1.25)
Chloride: 102 mmol/L (ref 98–110)
GFR, EST NON AFRICAN AMERICAN: 79 mL/min/{1.73_m2} (ref >=60)
GFR, Est African American: 92 mL/min/{1.73_m2} (ref >=60)
GLOBULIN: 2.6 g/dL (ref 1.9–3.7)
GLUCOSE: 98 mg/dL (ref 65–99)
Potassium: 4.2 mmol/L (ref 3.5–5.3)
SODIUM: 138 mmol/L (ref 135–146)
TOTAL PROTEIN: 6.7 g/dL (ref 6.1–8.1)
Total Bilirubin: 0.3 mg/dL (ref 0.2–1.2)

## 2017-05-04 LAB — CBC WITH DIFFERENTIAL/PLATELET
BASOS PCT: 0.7 %
Basophils Absolute: 42 cells/uL (ref 0–200)
EOS ABS: 210 {cells}/uL (ref 15–500)
Eosinophils Relative: 3.5 %
HCT: 38.3 % — ABNORMAL LOW (ref 38.5–50.0)
HEMOGLOBIN: 13.3 g/dL (ref 13.2–17.1)
Lymphs Abs: 1290 cells/uL (ref 850–3900)
MCH: 31.6 pg (ref 27.0–33.0)
MCHC: 34.7 g/dL (ref 32.0–36.0)
MCV: 91 fL (ref 80.0–100.0)
MONOS PCT: 7.3 %
MPV: 9.1 fL (ref 7.5–12.5)
Neutro Abs: 4020 cells/uL (ref 1500–7800)
Neutrophils Relative %: 67 %
PLATELETS: 319 10*3/uL (ref 140–400)
RBC: 4.21 10*6/uL (ref 4.20–5.80)
RDW: 11.8 % (ref 11.0–15.0)
Total Lymphocyte: 21.5 %
WBC: 6 10*3/uL (ref 3.8–10.8)
WBCMIX: 438 {cells}/uL (ref 200–950)

## 2017-05-04 LAB — IRON,TIBC AND FERRITIN PANEL
%SAT: 14 % (calc) — ABNORMAL LOW (ref 15–60)
Ferritin: 106 ng/mL (ref 20–380)
Iron: 47 ug/dL — ABNORMAL LOW (ref 50–180)
TIBC: 342 ug/dL (ref 250–425)

## 2017-05-04 LAB — TSH: TSH: 1.18 m[IU]/L (ref 0.40–4.50)

## 2017-05-04 LAB — VITAMIN D 25 HYDROXY (VIT D DEFICIENCY, FRACTURES): Vit D, 25-Hydroxy: 32 ng/mL (ref 30–100)

## 2017-05-04 NOTE — Progress Notes (Signed)
Echo ordered again; one was ordered in November, but I don't see that it was done

## 2017-05-09 DIAGNOSIS — Z96651 Presence of right artificial knee joint: Secondary | ICD-10-CM | POA: Diagnosis not present

## 2017-05-11 ENCOUNTER — Telehealth: Payer: Self-pay | Admitting: Family Medicine

## 2017-05-11 DIAGNOSIS — Z96651 Presence of right artificial knee joint: Secondary | ICD-10-CM | POA: Diagnosis not present

## 2017-05-14 ENCOUNTER — Other Ambulatory Visit: Payer: Self-pay

## 2017-05-14 NOTE — Telephone Encounter (Signed)
Pt notified to contact ortho if still having pain. And the place for plasma is [facility] [place] phone [phone]

## 2017-05-14 NOTE — Telephone Encounter (Addendum)
Relation to pt: self  Call back number: [phone]  Pharmacy:  [id number] - [place], [place] [address] [phone] (Phone) [phone] (Fax)      Reason for call:  Patient checking on the status of oxyCODONE-acetaminophen (PERCOCET/ROXICET) 5-325 MG tablet refill, pharmacy sent request on 05/11/17, please advise

## 2017-05-14 NOTE — Telephone Encounter (Signed)
I called the plasma center, option 0 I asked what are their requirements Staff there will go over the medication I need to know what would keep him from donating if I'm to provide a letter giving permission to donate Diabetes with insulin, tattos or piercings within the last year, she couldn't really say HCP letter = Health Care Provider Patient should have that letter and can bring that to [place] I spoke with [facility], then she got someone else 9 years is the cut-off age, so he cannot donate ------------------------------- Please let patient know that he is older than their cut-off age (55) Thank you

## 2017-05-14 NOTE — Telephone Encounter (Signed)
Pt.notified

## 2017-05-14 NOTE — Telephone Encounter (Signed)
I don't prescribe oxycodone for him That was from his orthopaedist If he is still having pain post-operatively, we'll ask him to contact his orthopaedist Otherwise, we'll want him to see a pain clinic I was keeping him on the hydrocodone until he had surgery If he wants referral to pain clinic, document where he is continuing to have pain and enter referral please In regards to the plasma donation, I found that some centers have an age limit; please document where he is wanting to donate so I may check their individual requirements Thank you

## 2017-05-14 NOTE — Telephone Encounter (Signed)
Pt also needs something in writing that says he is ok to donate plasma

## 2017-05-15 DIAGNOSIS — Z96651 Presence of right artificial knee joint: Secondary | ICD-10-CM | POA: Diagnosis not present

## 2017-05-17 DIAGNOSIS — Z96651 Presence of right artificial knee joint: Secondary | ICD-10-CM | POA: Diagnosis not present

## 2017-05-22 DIAGNOSIS — Z96651 Presence of right artificial knee joint: Secondary | ICD-10-CM | POA: Diagnosis not present

## 2017-05-24 DIAGNOSIS — Z96651 Presence of right artificial knee joint: Secondary | ICD-10-CM | POA: Diagnosis not present

## 2017-05-29 ENCOUNTER — Other Ambulatory Visit: Payer: Self-pay

## 2017-05-29 ENCOUNTER — Encounter (HOSPITAL_COMMUNITY): Payer: Self-pay | Admitting: *Deleted

## 2017-05-29 DIAGNOSIS — Z96651 Presence of right artificial knee joint: Secondary | ICD-10-CM | POA: Diagnosis not present

## 2017-05-29 NOTE — Progress Notes (Signed)
Please place orders in Epic as patient is being scheduled for a pre-op appointment! Thank you! 

## 2017-05-31 ENCOUNTER — Encounter (HOSPITAL_COMMUNITY)
Admission: RE | Disposition: A | Discharge status: Home / Self Care | Payer: Self-pay | Source: Ambulatory Visit | Attending: Orthopedic Surgery

## 2017-05-31 ENCOUNTER — Ambulatory Visit (HOSPITAL_COMMUNITY): Payer: Medicare HMO | Admitting: Anesthesiology

## 2017-05-31 ENCOUNTER — Observation Stay (HOSPITAL_COMMUNITY)
Admission: RE | Admit: 2017-05-31 | Discharge: 2017-06-01 | Disposition: A | Discharge status: Home / Self Care | Payer: Medicare HMO | Source: Ambulatory Visit | Attending: Orthopedic Surgery | Admitting: Orthopedic Surgery

## 2017-05-31 ENCOUNTER — Other Ambulatory Visit: Payer: Self-pay

## 2017-05-31 ENCOUNTER — Encounter (HOSPITAL_COMMUNITY): Payer: Self-pay | Admitting: Emergency Medicine

## 2017-05-31 DIAGNOSIS — M19041 Primary osteoarthritis, right hand: Secondary | ICD-10-CM | POA: Insufficient documentation

## 2017-05-31 DIAGNOSIS — F419 Anxiety disorder, unspecified: Secondary | ICD-10-CM | POA: Diagnosis not present

## 2017-05-31 DIAGNOSIS — Z9889 Other specified postprocedural states: Secondary | ICD-10-CM | POA: Insufficient documentation

## 2017-05-31 DIAGNOSIS — Z86010 Personal history of colonic polyps: Secondary | ICD-10-CM | POA: Diagnosis not present

## 2017-05-31 DIAGNOSIS — Z7982 Long term (current) use of aspirin: Secondary | ICD-10-CM | POA: Diagnosis not present

## 2017-05-31 DIAGNOSIS — Z85828 Personal history of other malignant neoplasm of skin: Secondary | ICD-10-CM | POA: Insufficient documentation

## 2017-05-31 DIAGNOSIS — E119 Type 2 diabetes mellitus without complications: Secondary | ICD-10-CM

## 2017-05-31 DIAGNOSIS — Z7984 Long term (current) use of oral hypoglycemic drugs: Secondary | ICD-10-CM | POA: Insufficient documentation

## 2017-05-31 DIAGNOSIS — Z6838 Body mass index (BMI) 38.0-38.9, adult: Secondary | ICD-10-CM | POA: Diagnosis not present

## 2017-05-31 DIAGNOSIS — M24661 Ankylosis, right knee: Secondary | ICD-10-CM | POA: Diagnosis present

## 2017-05-31 DIAGNOSIS — I10 Essential (primary) hypertension: Secondary | ICD-10-CM | POA: Insufficient documentation

## 2017-05-31 DIAGNOSIS — Z8261 Family history of arthritis: Secondary | ICD-10-CM | POA: Diagnosis not present

## 2017-05-31 DIAGNOSIS — Z811 Family history of alcohol abuse and dependence: Secondary | ICD-10-CM | POA: Diagnosis not present

## 2017-05-31 DIAGNOSIS — G8918 Other acute postprocedural pain: Secondary | ICD-10-CM | POA: Diagnosis not present

## 2017-05-31 DIAGNOSIS — K5900 Constipation, unspecified: Secondary | ICD-10-CM | POA: Insufficient documentation

## 2017-05-31 DIAGNOSIS — F329 Major depressive disorder, single episode, unspecified: Secondary | ICD-10-CM | POA: Diagnosis not present

## 2017-05-31 DIAGNOSIS — G4730 Sleep apnea, unspecified: Secondary | ICD-10-CM | POA: Diagnosis not present

## 2017-05-31 DIAGNOSIS — M479 Spondylosis, unspecified: Secondary | ICD-10-CM | POA: Diagnosis not present

## 2017-05-31 DIAGNOSIS — E1151 Type 2 diabetes mellitus with diabetic peripheral angiopathy without gangrene: Secondary | ICD-10-CM | POA: Diagnosis not present

## 2017-05-31 DIAGNOSIS — M19042 Primary osteoarthritis, left hand: Secondary | ICD-10-CM | POA: Insufficient documentation

## 2017-05-31 DIAGNOSIS — Z96653 Presence of artificial knee joint, bilateral: Secondary | ICD-10-CM | POA: Insufficient documentation

## 2017-05-31 DIAGNOSIS — T8482XA Fibrosis due to internal orthopedic prosthetic devices, implants and grafts, initial encounter: Principal | ICD-10-CM | POA: Insufficient documentation

## 2017-05-31 DIAGNOSIS — Z79899 Other long term (current) drug therapy: Secondary | ICD-10-CM | POA: Insufficient documentation

## 2017-05-31 DIAGNOSIS — Z823 Family history of stroke: Secondary | ICD-10-CM | POA: Insufficient documentation

## 2017-05-31 DIAGNOSIS — R69 Illness, unspecified: Secondary | ICD-10-CM | POA: Diagnosis not present

## 2017-05-31 DIAGNOSIS — Y793 Surgical instruments, materials and orthopedic devices (including sutures) associated with adverse incidents: Secondary | ICD-10-CM | POA: Diagnosis not present

## 2017-05-31 DIAGNOSIS — Z818 Family history of other mental and behavioral disorders: Secondary | ICD-10-CM | POA: Diagnosis not present

## 2017-05-31 HISTORY — PX: KNEE CLOSED REDUCTION: SHX995

## 2017-05-31 LAB — GLUCOSE, CAPILLARY
GLUCOSE-CAPILLARY: 117 mg/dL — AB (ref 65–99)
GLUCOSE-CAPILLARY: 95 mg/dL (ref 65–99)
Glucose-Capillary: 98 mg/dL (ref 65–99)
Glucose-Capillary: 102 mg/dL — ABNORMAL HIGH (ref 65–99)

## 2017-05-31 LAB — HEMOGLOBIN A1C
Hgb A1c MFr Bld: 6.2 % — ABNORMAL HIGH (ref 4.8–5.6)
Mean Plasma Glucose: 131.24 mg/dL

## 2017-05-31 SURGERY — MANIPULATION, KNEE, CLOSED
Anesthesia: General | Site: Knee | Laterality: Right

## 2017-05-31 MED ORDER — POLYETHYLENE GLYCOL 3350 17 G PO PACK
17.0000 g | PACK | Freq: Every day | ORAL | Status: DC | PRN
  Administered 2017-06-01: 17 g via ORAL
  Filled 2017-05-31: qty 1

## 2017-05-31 MED ORDER — PROPOFOL 10 MG/ML IV BOLUS
INTRAVENOUS | Status: AC
  Filled 2017-05-31: qty 20

## 2017-05-31 MED ORDER — SODIUM CHLORIDE 0.9% FLUSH: 3.0000 mL | Freq: Two times a day (BID) | INTRAVENOUS | Status: DC

## 2017-05-31 MED ORDER — SILDENAFIL CITRATE 20 MG PO TABS: 40.0000 mg | ORAL_TABLET | ORAL | Status: DC | PRN

## 2017-05-31 MED ORDER — LIDOCAINE 2% (20 MG/ML) 5 ML SYRINGE
INTRAMUSCULAR | Status: DC | PRN
  Administered 2017-05-31: 100 mg via INTRAVENOUS

## 2017-05-31 MED ORDER — LIDOCAINE 2% (20 MG/ML) 5 ML SYRINGE
INTRAMUSCULAR | Status: AC
  Filled 2017-05-31: qty 5

## 2017-05-31 MED ORDER — BISACODYL 10 MG RE SUPP: 10.0000 mg | Freq: Every day | RECTAL | Status: DC | PRN

## 2017-05-31 MED ORDER — SODIUM CHLORIDE 0.9% FLUSH: 3.0000 mL | INTRAVENOUS | Status: DC | PRN

## 2017-05-31 MED ORDER — FENTANYL CITRATE (PF) 100 MCG/2ML IJ SOLN
100.0000 ug | Freq: Once | INTRAMUSCULAR | Status: AC
  Administered 2017-05-31: 50 ug via INTRAVENOUS

## 2017-05-31 MED ORDER — PHENYLEPHRINE 40 MCG/ML (10ML) SYRINGE FOR IV PUSH (FOR BLOOD PRESSURE SUPPORT)
PREFILLED_SYRINGE | INTRAVENOUS | Status: DC | PRN
  Administered 2017-05-31: 80 ug via INTRAVENOUS

## 2017-05-31 MED ORDER — MAGNESIUM OXIDE 400 (241.3 MG) MG PO TABS
400.0000 mg | ORAL_TABLET | Freq: Every day | ORAL | Status: DC
  Administered 2017-06-01: 400 mg via ORAL
  Filled 2017-05-31: qty 1

## 2017-05-31 MED ORDER — DIPHENHYDRAMINE-APAP (SLEEP) 25-500 MG PO TABS: 2.0000 | ORAL_TABLET | Freq: Every evening | ORAL | Status: DC | PRN

## 2017-05-31 MED ORDER — SODIUM CHLORIDE 0.9 % IV SOLN: INTRAVENOUS | Status: DC

## 2017-05-31 MED ORDER — ONDANSETRON HCL 4 MG PO TABS
4.0000 mg | ORAL_TABLET | Freq: Four times a day (QID) | ORAL | Status: DC | PRN
  Filled 2017-05-31: qty 1

## 2017-05-31 MED ORDER — METOCLOPRAMIDE HCL 5 MG/ML IJ SOLN: 5.0000 mg | Freq: Three times a day (TID) | INTRAMUSCULAR | Status: DC | PRN

## 2017-05-31 MED ORDER — ONDANSETRON HCL 4 MG/2ML IJ SOLN
INTRAMUSCULAR | Status: AC
  Filled 2017-05-31: qty 2

## 2017-05-31 MED ORDER — METFORMIN HCL 500 MG PO TABS
500.0000 mg | ORAL_TABLET | Freq: Every day | ORAL | Status: DC
  Administered 2017-06-01: 500 mg via ORAL
  Filled 2017-05-31: qty 1

## 2017-05-31 MED ORDER — HYDROMORPHONE HCL 1 MG/ML IJ SOLN: 0.2500 mg | INTRAMUSCULAR | Status: DC | PRN

## 2017-05-31 MED ORDER — ASPIRIN EC 325 MG PO TBEC
325.0000 mg | DELAYED_RELEASE_TABLET | Freq: Two times a day (BID) | ORAL | Status: DC
  Administered 2017-05-31 – 2017-06-01 (×2): 325 mg via ORAL
  Filled 2017-05-31 (×2): qty 1

## 2017-05-31 MED ORDER — SODIUM CHLORIDE 0.9 % IV SOLN: 250.0000 mL | INTRAVENOUS | Status: DC | PRN

## 2017-05-31 MED ORDER — MIDAZOLAM HCL 2 MG/2ML IJ SOLN
INTRAMUSCULAR | Status: AC
  Administered 2017-05-31: 2 mg via INTRAVENOUS
  Filled 2017-05-31: qty 2

## 2017-05-31 MED ORDER — VITAMIN D 1000 UNITS PO TABS
2000.0000 [IU] | ORAL_TABLET | Freq: Every day | ORAL | Status: DC
  Administered 2017-06-01: 2000 [IU] via ORAL
  Filled 2017-05-31: qty 2

## 2017-05-31 MED ORDER — HYDROCODONE-ACETAMINOPHEN 10-325 MG PO TABS: 1.0000 | ORAL_TABLET | ORAL | Status: DC | PRN

## 2017-05-31 MED ORDER — ROPIVACAINE HCL 5 MG/ML IJ SOLN
INTRAMUSCULAR | Status: DC | PRN
  Administered 2017-05-31: 30 mL via PERINEURAL

## 2017-05-31 MED ORDER — ASPIRIN 325 MG PO TABS
325.0000 mg | ORAL_TABLET | Freq: Every day | ORAL | Status: DC
  Filled 2017-05-31: qty 1

## 2017-05-31 MED ORDER — FLEET ENEMA 7-19 GM/118ML RE ENEM: 1.0000 | ENEMA | Freq: Once | RECTAL | Status: DC | PRN

## 2017-05-31 MED ORDER — OXYCODONE HCL 5 MG PO TABS: 5.0000 mg | ORAL_TABLET | Freq: Once | ORAL | Status: DC | PRN

## 2017-05-31 MED ORDER — DOCUSATE SODIUM 100 MG PO CAPS
100.0000 mg | ORAL_CAPSULE | Freq: Two times a day (BID) | ORAL | Status: DC
  Administered 2017-05-31 – 2017-06-01 (×2): 100 mg via ORAL
  Filled 2017-05-31 (×2): qty 1

## 2017-05-31 MED ORDER — BUPROPION HCL ER (SR) 150 MG PO TB12
150.0000 mg | ORAL_TABLET | Freq: Two times a day (BID) | ORAL | Status: DC
  Administered 2017-05-31 – 2017-06-01 (×2): 150 mg via ORAL
  Filled 2017-05-31 (×2): qty 1

## 2017-05-31 MED ORDER — TURMERIC 500 MG PO CAPS: 500.0000 mg | ORAL_CAPSULE | Freq: Every day | ORAL | Status: DC

## 2017-05-31 MED ORDER — HYDROCODONE-ACETAMINOPHEN 5-325 MG PO TABS
1.0000 | ORAL_TABLET | ORAL | Status: DC | PRN
  Administered 2017-05-31: 2 via ORAL
  Filled 2017-05-31: qty 2

## 2017-05-31 MED ORDER — ACETAMINOPHEN 325 MG PO TABS: 650.0000 mg | ORAL_TABLET | Freq: Four times a day (QID) | ORAL | Status: DC | PRN

## 2017-05-31 MED ORDER — LACTATED RINGERS IV SOLN
INTRAVENOUS | Status: DC
  Administered 2017-05-31 (×2): via INTRAVENOUS

## 2017-05-31 MED ORDER — DIPHENHYDRAMINE HCL 25 MG PO CAPS: 25.0000 mg | ORAL_CAPSULE | Freq: Every evening | ORAL | Status: DC | PRN

## 2017-05-31 MED ORDER — OXYCODONE-ACETAMINOPHEN 5-325 MG PO TABS: 1.0000 | ORAL_TABLET | Freq: Four times a day (QID) | ORAL | Status: DC | PRN

## 2017-05-31 MED ORDER — ACETAMINOPHEN 325 MG PO TABS: 650.0000 mg | ORAL_TABLET | ORAL | Status: DC | PRN

## 2017-05-31 MED ORDER — FENTANYL CITRATE (PF) 100 MCG/2ML IJ SOLN
INTRAMUSCULAR | Status: AC
  Administered 2017-05-31: 50 ug via INTRAVENOUS
  Filled 2017-05-31: qty 2

## 2017-05-31 MED ORDER — METHOCARBAMOL 1000 MG/10ML IJ SOLN
500.0000 mg | Freq: Four times a day (QID) | INTRAMUSCULAR | Status: DC | PRN
  Administered 2017-05-31: 500 mg via INTRAVENOUS
  Filled 2017-05-31: qty 550

## 2017-05-31 MED ORDER — PROMETHAZINE HCL 25 MG/ML IJ SOLN: 6.2500 mg | INTRAMUSCULAR | Status: DC | PRN

## 2017-05-31 MED ORDER — METHOCARBAMOL 500 MG PO TABS
500.0000 mg | ORAL_TABLET | Freq: Four times a day (QID) | ORAL | Status: DC | PRN
Start: 2017-05-31 — End: 2017-06-01

## 2017-05-31 MED ORDER — METOCLOPRAMIDE HCL 5 MG PO TABS: 5.0000 mg | ORAL_TABLET | Freq: Three times a day (TID) | ORAL | Status: DC | PRN

## 2017-05-31 MED ORDER — PHENYLEPHRINE 40 MCG/ML (10ML) SYRINGE FOR IV PUSH (FOR BLOOD PRESSURE SUPPORT)
PREFILLED_SYRINGE | INTRAVENOUS | Status: AC
  Filled 2017-05-31: qty 10

## 2017-05-31 MED ORDER — MIDAZOLAM HCL 2 MG/2ML IJ SOLN
2.0000 mg | Freq: Once | INTRAMUSCULAR | Status: AC
  Administered 2017-05-31: 2 mg via INTRAVENOUS

## 2017-05-31 MED ORDER — PROPOFOL 10 MG/ML IV BOLUS
INTRAVENOUS | Status: DC | PRN
  Administered 2017-05-31: 200 mg via INTRAVENOUS

## 2017-05-31 MED ORDER — OXYCODONE HCL 5 MG/5ML PO SOLN
5.0000 mg | Freq: Once | ORAL | Status: DC | PRN
  Filled 2017-05-31: qty 5

## 2017-05-31 MED ORDER — ACETAMINOPHEN 650 MG RE SUPP: 650.0000 mg | RECTAL | Status: DC | PRN

## 2017-05-31 MED ORDER — LISINOPRIL 5 MG PO TABS
2.5000 mg | ORAL_TABLET | Freq: Every day | ORAL | Status: DC
  Administered 2017-06-01: 2.5 mg via ORAL
  Filled 2017-05-31: qty 1

## 2017-05-31 MED ORDER — ONDANSETRON HCL 4 MG/2ML IJ SOLN: 4.0000 mg | Freq: Four times a day (QID) | INTRAMUSCULAR | Status: DC | PRN

## 2017-05-31 MED ORDER — ATORVASTATIN CALCIUM 10 MG PO TABS
10.0000 mg | ORAL_TABLET | Freq: Every day | ORAL | Status: DC
  Administered 2017-05-31: 10 mg via ORAL
  Filled 2017-05-31: qty 1

## 2017-05-31 MED ORDER — ASPIRIN 325 MG PO TABS: 325.0000 mg | ORAL_TABLET | Freq: Two times a day (BID) | ORAL | Status: DC

## 2017-05-31 MED ORDER — LAMOTRIGINE 100 MG PO TABS
150.0000 mg | ORAL_TABLET | Freq: Every day | ORAL | Status: DC
  Administered 2017-06-01: 150 mg via ORAL
  Filled 2017-05-31: qty 1

## 2017-05-31 MED ORDER — ACETAMINOPHEN 650 MG RE SUPP
650.0000 mg | Freq: Four times a day (QID) | RECTAL | Status: DC | PRN
  Filled 2017-05-31: qty 1

## 2017-05-31 MED ORDER — INSULIN ASPART 100 UNIT/ML ~~LOC~~ SOLN: 0.0000 [IU] | Freq: Three times a day (TID) | SUBCUTANEOUS | Status: DC

## 2017-05-31 MED ORDER — ONDANSETRON HCL 4 MG/2ML IJ SOLN
INTRAMUSCULAR | Status: DC | PRN
  Administered 2017-05-31: 4 mg via INTRAVENOUS

## 2017-05-31 SURGICAL SUPPLY — 3 items
DRSG KUZMA FLUFF (GAUZE/BANDAGES/DRESSINGS) ×2 IMPLANT
PADDING CAST COTTON 6X4 STRL (CAST SUPPLIES) ×2 IMPLANT
WRAP KNEE MAXI GEL POST OP (GAUZE/BANDAGES/DRESSINGS) ×2 IMPLANT

## 2017-05-31 NOTE — Anesthesia Preprocedure Evaluation (Signed)
Anesthesia Evaluation  Patient identified by MRN, date of birth, ID band Patient awake    Reviewed: Allergy & Precautions, NPO status , Patient's Chart, lab work & pertinent test results  Airway Mallampati: III  TM Distance: >3 FB Neck ROM: Full    Dental  (+) Teeth Intact, Dental Advisory Given   Pulmonary sleep apnea and Continuous Positive Airway Pressure Ventilation ,    breath sounds clear to auscultation       Cardiovascular hypertension, Pt. on medications + Peripheral Vascular Disease   Rhythm:Regular Rate:Normal     Neuro/Psych PSYCHIATRIC DISORDERS Anxiety Depression  Neuromuscular disease    GI/Hepatic negative GI ROS, Neg liver ROS,   Endo/Other  diabetes, Type 2, Oral Hypoglycemic Agents  Renal/GU      Musculoskeletal  (+) Arthritis ,   Abdominal (+) + obese,   Peds  Hematology   Anesthesia Other Findings   Reproductive/Obstetrics                             Lab Results  Component Value Date   WBC 6.0 05/03/2017   HGB 13.3 05/03/2017   HCT 38.3 (L) 05/03/2017   MCV 91.0 05/03/2017   PLT 319 05/03/2017   EKG: normal sinus rhythm.  Anesthesia Physical  Anesthesia Plan  ASA: III  Anesthesia Plan: General   Post-op Pain Management:  Regional for Post-op pain   Induction: Intravenous  PONV Risk Score and Plan: 2 and Ondansetron, Midazolam and Treatment may vary due to age or medical condition  Airway Management Planned: Mask  Additional Equipment:   Intra-op Plan:   Post-operative Plan:   Informed Consent: I have reviewed the patients History and Physical, chart, labs and discussed the procedure including the risks, benefits and alternatives for the proposed anesthesia with the patient or authorized representative who has indicated his/her understanding and acceptance.   Dental advisory given  Plan Discussed with: CRNA  Anesthesia Plan Comments:          Anesthesia Quick Evaluation

## 2017-05-31 NOTE — Anesthesia Procedure Notes (Signed)
Anesthesia Regional Block: Femoral nerve block   Pre-Anesthetic Checklist: ,, timeout performed, Correct Patient, Correct Site, Correct Laterality, Correct Procedure, Correct Position, site marked, Risks and benefits discussed,  Surgical consent,  Pre-op evaluation,  At surgeon's request and post-op pain management  Laterality: Right  Prep: chloraprep       Needles:  Injection technique: Single-shot  Needle Type: Stimiplex     Needle Length: 9cm  Needle Gauge: 21     Additional Needles:   Procedures:,,,, ultrasound used (permanent image in chart),,,,  Narrative:  Start time: 05/31/2017 12:20 PM End time: 05/31/2017 12:25 PM Injection made incrementally with aspirations every 5 mL.  Performed by: Personally  Anesthesiologist: Lynda Rainwater, MD

## 2017-05-31 NOTE — Progress Notes (Signed)
Assisted Dr. Miller with right, ultrasound guided, femoral block. Side rails up, monitors on throughout procedure. See vital signs in flow sheet. Tolerated Procedure well. °

## 2017-05-31 NOTE — Anesthesia Procedure Notes (Signed)
Procedure Name: LMA Insertion Date/Time: 05/31/2017 12:43 PM Performed by: Lind Covert, CRNA Pre-anesthesia Checklist: Patient identified, Emergency Drugs available, Suction available, Patient being monitored and Timeout performed Patient Re-evaluated:Patient Re-evaluated prior to induction Oxygen Delivery Method: Circle system utilized Preoxygenation: Pre-oxygenation with 100% oxygen Induction Type: IV induction LMA: LMA inserted LMA Size: 5.0 Tube type: Oral Number of attempts: 1 Placement Confirmation: positive ETCO2 and breath sounds checked- equal and bilateral Tube secured with: Tape Dental Injury: Teeth and Oropharynx as per pre-operative assessment

## 2017-05-31 NOTE — Evaluation (Signed)
Physical Therapy Evaluation Patient Details Name: Duane Hughes MRN: [id number] DOB: 1950/09/15 Today's Date: 05/31/2017   History of Present Illness  67yo M s/p R TKA in 12/18, noew S/P manipulation with femoral nerve block with  ongoing quad weakness  Clinical Impression  Patient presents s/p right TKA manipulation, received a femoral nerve block which remains effective, therefore the quad strength is very limited. The right knee buckles with KI in place and secured tightly. The patient is high risk for fall due to poor quad strength. The patient at this time is unable to ambulate safely and negotiate steps. RN to notify dr. Gladstone Lighter of concerns. Pt admitted with above diagnosis. Pt currently with functional limitations due to the deficits listed below (see PT Problem List).  Pt will benefit from skilled PT to increase their independence and safety with mobility to allow discharge to the venue listed below.       Follow Up Recommendations DC plan and follow up therapy as arranged by surgeon    Equipment Recommendations  None recommended by PT    Recommendations for Other Services       Precautions / Restrictions Precautions Precautions: Fall;Knee Required Braces or Orthoses: Knee Immobilizer - Right Knee Immobilizer - Right: Discontinue once straight leg raise with < 10 degree lag      Mobility  Bed Mobility               General bed mobility comments: in recliner  Transfers Overall transfer level: Needs assistance Equipment used: Rolling walker (2 wheeled) Transfers: Sit to/from Stand Sit to Stand: Mod assist         General transfer comment: cues for safety and for steady assist for standing with KI  on the right, noted knee buckling.  Ambulation/Gait Ambulation/Gait assistance: Mod assist;+2 safety/equipment Ambulation Distance (Feet): 5 Feet Assistive device: Rolling walker (2 wheeled) Gait Pattern/deviations: Step-to pattern     General Gait Details:  right knee  buckling with KI in place. Patient very nervous about attempting  ambulation. Provided manual  support at the right knee during attempts to take steps. Unable to block the knee enough to prevent buckling.  Stairs Stairs: Yes       General stair comments: unable to attempt due to significant right knee buckling with level surface attempts.   Wheelchair Mobility    Modified Rankin (Stroke Patients Only)       Balance Overall balance assessment: Needs assistance Sitting-balance support: Feet supported;No upper extremity supported Sitting balance-Leahy Scale: Good     Standing balance support: During functional activity;Bilateral upper extremity supported Standing balance-Leahy Scale: Poor Standing balance comment: due to right knee buckling                             Pertinent Vitals/Pain Pain Assessment: No/denies pain    Home Living Family/patient expects to be discharged to:: Private residence Living Arrangements: Spouse/significant other Available Help at Discharge: Family Type of Home: House Home Access: Stairs to enter Entrance Stairs-Rails: None Entrance Stairs-Number of Steps: 3-5 Home Layout: One level Home Equipment: Environmental consultant - 2 wheels      Prior Function     Gait / Transfers Assistance Needed: independent  ADL's / Homemaking Assistance Needed: wife assisted with LB self-care PTA        Hand Dominance        Extremity/Trunk Assessment   Upper Extremity Assessment Upper Extremity Assessment: Overall WFL for tasks assessed  Lower Extremity Assessment Lower Extremity Assessment: RLE deficits/detail RLE Deficits / Details: attempts for SLR with significant lag, knee cuckles even with KI placed very tight. Sensation impaired about the lower thigh and knee    Cervical / Trunk Assessment Cervical / Trunk Assessment: Normal  Communication   Communication: No difficulties  Cognition Arousal/Alertness: Awake/alert Behavior  During Therapy: WFL for tasks assessed/performed Overall Cognitive Status: Within Functional Limits for tasks assessed                                        General Comments      Exercises     Assessment/Plan    PT Assessment Patient needs continued PT services  PT Problem List Decreased strength;Decreased knowledge of use of DME;Decreased activity tolerance;Decreased safety awareness;Decreased knowledge of precautions;Decreased mobility;Decreased range of motion;Decreased balance       PT Treatment Interventions DME instruction;Gait training;Stair training;Functional mobility training;Therapeutic activities;Therapeutic exercise;Patient/family education    PT Goals (Current goals can be found in the Care Plan section)  Acute Rehab PT Goals Patient Stated Goal: to walk, get my  strength back and the ROM PT Goal Formulation: With patient/family Time For Goal Achievement: 06/02/17 Potential to Achieve Goals: Good    Frequency 7X/week   Barriers to discharge        Co-evaluation               AM-PAC PT "6 Clicks" Daily Activity  Outcome Measure Difficulty turning over in bed (including adjusting bedclothes, sheets and blankets)?: A Little Difficulty moving from lying on back to sitting on the side of the bed? : A Little Difficulty sitting down on and standing up from a chair with arms (e.g., wheelchair, bedside commode, etc,.)?: Unable Help needed moving to and from a bed to chair (including a wheelchair)?: Total Help needed walking in hospital room?: Total Help needed climbing 3-5 steps with a railing? : Total 6 Click Score: 10    End of Session Equipment Utilized During Treatment: Gait belt Activity Tolerance: Patient tolerated treatment well Patient left: in chair;with nursing/sitter in room;with call bell/phone within reach Nurse Communication: Mobility status PT Visit Diagnosis: Unsteadiness on feet (R26.81);Difficulty in walking, not  elsewhere classified (R26.2)    Time: 6644-0347 PT Time Calculation (min) (ACUTE ONLY): 40 min   Charges:   PT Evaluation $PT Eval Moderate Complexity: 1 Mod PT Treatments $Gait Training: 23-37 mins   PT G CodesTresa Endo PT [phone]  Claretha Cooper 05/31/2017, 4:14 PM

## 2017-05-31 NOTE — Discharge Instructions (Addendum)
Call Dr Charlestine Night office to schedule an appointment to see him in one week.  Patient to be taught by therapy how to use walker.  Patient to continue to take his Aspirin 325 mg twice a day starting this afternoon when he arrives home.

## 2017-05-31 NOTE — Progress Notes (Signed)
When nurses were tranferring patient from stretcher to chair, pt was unable to bear any weight on right leg; pt able to pivot on left leg into chair

## 2017-05-31 NOTE — Anesthesia Postprocedure Evaluation (Signed)
Anesthesia Post Note  Patient: Duane Hughes  Procedure(s) Performed: CLOSED MANIPULATION RIGHT KNEE (Right Knee)     Patient location during evaluation: PACU Anesthesia Type: General Level of consciousness: awake and alert Pain management: pain level controlled Vital Signs Assessment: post-procedure vital signs reviewed and stable Respiratory status: spontaneous breathing, nonlabored ventilation, respiratory function stable and patient connected to nasal cannula oxygen Cardiovascular status: blood pressure returned to baseline and stable Postop Assessment: no apparent nausea or vomiting Anesthetic complications: no    Last Vitals:  Vitals:   05/31/17 1234 05/31/17 1309  BP: (!) 151/87   Pulse:    Resp:    Temp:  (!) 36.4 C  SpO2:      Last Pain:  Vitals:   05/31/17 1309  TempSrc:   PainSc: [place]

## 2017-05-31 NOTE — Interval H&P Note (Signed)
History and Physical Interval Note:  05/31/2017 12:41 PM  Duane Hughes  has presented today for surgery, with the diagnosis of Arthrofibosis Right Total Knee  The various methods of treatment have been discussed with the patient and family. After consideration of risks, benefits and other options for treatment, the patient has consented to  Procedure(s) with comments: CLOSED MANIPULATION RIGHT KNEE (Right) - Femoral Block as a surgical intervention .  The patient's history has been reviewed, patient examined, no change in status, stable for surgery.  I have reviewed the patient's chart and labs.  Questions were answered to the patient's satisfaction.     Latanya Maudlin

## 2017-05-31 NOTE — H&P (Signed)
Duane Hughes is an 67 y.o. male.   Chief Complaint: Limited motion of Right Total Knee HPI: Prior Right Total Knee with limited flexion.  Past Medical History:  Diagnosis Date  . Anxiety   . Arthritis    back, hands, knees  . Colon polyp 05/09/2016   TUBULAR ADENOMAS   . Depression   . Diabetes mellitus without complication ([place])    type 2  . Ectatic abdominal aorta ([place]) 04/18/2016   2.9 cm on [place] 2016, unchanged 2017  . History of skin cancer    hand  . Hypertension   . Morbid obesity ([place]) 11/11/2014  . Obesity 11/11/2014  . Sleep apnea    cpap    Past Surgical History:  Procedure Laterality Date  . COLONOSCOPY WITH PROPOFOL N/A 05/09/2016   Procedure: COLONOSCOPY WITH PROPOFOL;  Surgeon: Lucilla Lame, MD;  Location: [facility];  Service: Endoscopy;  Laterality: N/A;  . HERNIA REPAIR    . JOINT REPLACEMENT     04-04-17 Dr. Gladstone Lighter   Right knee  . KNEE ARTHROSCOPY     several scopes done  . MEDIAL PARTIAL KNEE REPLACEMENT Left    2000s?  Marland Kitchen SKIN CANCER EXCISION  Feb 2017   removed from right hand  . TOTAL KNEE ARTHROPLASTY Right 04/04/2017   Procedure: RIGHT TOTAL KNEE ARTHROPLASTY;  Surgeon: Latanya Maudlin, MD;  Location: [facility];  Service: Orthopedics;  Laterality: Right;    Family History  Problem Relation Age of Onset  . Alcohol abuse Mother   . Rheum arthritis Mother   . Stroke Maternal Grandmother   . Stroke Paternal Grandfather   . Dementia Paternal Grandmother   . Cancer Neg Hx   . Diabetes Neg Hx   . Heart disease Neg Hx   . Hypertension Neg Hx   . Kidney disease Neg Hx   . Prostate cancer Neg Hx    Social History:  reports that  has never smoked. he has never used smokeless tobacco. He reports that he does not drink alcohol or use drugs.  Allergies: No Known Allergies  Medications Prior to Admission  Medication Sig Dispense Refill  . aspirin EC 81 MG tablet Take 81 mg by mouth daily.    Marland Kitchen atorvastatin (LIPITOR) 10 MG tablet TAKE 1 TABLET BY  MOUTH EVERY NIGHT AT BEDTIME (Patient taking differently: TAKE 10 MG BY MOUTH EVERY NIGHT AT BEDTIME) 30 tablet 6  . buPROPion (WELLBUTRIN SR) 150 MG 12 hr tablet TAKE 1 TABLET BY MOUTH TWICE A DAY (Patient taking differently: TAKE 150 MG BY MOUTH TWICE A DAY) 60 tablet 11  . Cholecalciferol (EQL VITAMIN D3) 2000 units CAPS Take 2,000 Units by mouth daily.     . diphenhydramine-acetaminophen (TYLENOL PM) 25-500 MG TABS tablet Take 2 tablets by mouth at bedtime as needed (sleep).    Marland Kitchen lamoTRIgine (LAMICTAL) 150 MG tablet Take 150 mg by mouth daily.     Marland Kitchen lisinopril (PRINIVIL,ZESTRIL) 5 MG tablet TAKE 1/2 TABLET BY MOUTH DAILY. (Patient taking differently: TAKE 2.5 MG BY MOUTH DAILY.) 45 tablet 1  . magnesium oxide (MAG-OX) 400 MG tablet Take 400 mg by mouth daily.    Marland Kitchen MELATONIN GUMMIES PO Take by mouth at bedtime as needed.    . metFORMIN (GLUCOPHAGE) 500 MG tablet TAKE 1 TABLET BY MOUTH 2 TIMES DAILY WITH A MEAL. 60 tablet 6  . Multiple Vitamins-Minerals (MULTIVITAMIN ADULT PO) Take 1 tablet by mouth daily.    Marland Kitchen oxyCODONE-acetaminophen (PERCOCET/ROXICET) 5-325 MG tablet Take  1-2 tablets by mouth every 6 (six) hours as needed for severe pain. (Patient taking differently: Take 1 tablet by mouth every 6 (six) hours as needed for severe pain. ) 60 tablet 0  . sildenafil (REVATIO) 20 MG tablet Take 3 to 5 tablets two hours before intercouse on an empty stomach.  Do not take with nitrates. (Patient taking differently: Take 40 mg by mouth as needed (for intercourse). Do not take with nitrates.) 50 tablet 3  . Turmeric 500 MG CAPS Take 500 mg by mouth daily.     Marland Kitchen aspirin EC 325 MG tablet Take 1 tablet (325 mg total) by mouth 2 (two) times daily. (Patient taking differently: Take 325 mg by mouth daily. ) 30 tablet 0    Results for orders placed or performed during the hospital encounter of 05/31/17 (from the past 48 hour(s))  Glucose, capillary     Status: Abnormal   Collection Time: 05/31/17 11:06 AM   Result Value Ref Range   Glucose-Capillary 117 (H) 65 - 99 mg/dL   No results found.  Review of Systems  Constitutional: Negative.   HENT: Negative.   Eyes: Negative.   Respiratory: Negative.   Cardiovascular: Negative.   Gastrointestinal: Negative.   Genitourinary: Negative.   Musculoskeletal: Positive for joint pain.  Skin: Negative.   Neurological: Negative.   Endo/Heme/Allergies: Negative.   Psychiatric/Behavioral: Negative.     Blood pressure (!) 151/87, pulse 71, temperature 97.8 F (36.6 C), temperature source Oral, resp. rate 14, height 5\' 9"  (1.753 m), weight 117.9 kg (260 lb), SpO2 96 %. Physical Exam  Constitutional: He appears well-developed.  HENT:  Head: Normocephalic.  Eyes: Pupils are equal, round, and reactive to light.  Neck: Normal range of motion.  Cardiovascular: Normal rate.  Respiratory: Effort normal.  GI: Soft.  Musculoskeletal:  Contracture of Right Total Knee  Neurological: He is alert.  Skin: Skin is warm.  Psychiatric: He has a normal mood and affect.     Assessment/Plan Gentle closed manipulation of Right Total Knee.  Latanya Maudlin, MD 05/31/2017, 12:38 PM

## 2017-05-31 NOTE — Anesthesia Postprocedure Evaluation (Signed)
Anesthesia Post Note  Patient: Duane Hughes  Procedure(s) Performed: CLOSED MANIPULATION RIGHT KNEE (Right Knee)     Patient location during evaluation: PACU Anesthesia Type: General and Regional Level of consciousness: awake and alert Pain management: pain level controlled Vital Signs Assessment: post-procedure vital signs reviewed and stable Respiratory status: spontaneous breathing, nonlabored ventilation, respiratory function stable and patient connected to nasal cannula oxygen Cardiovascular status: blood pressure returned to baseline and stable Postop Assessment: no apparent nausea or vomiting Anesthetic complications: no    Last Vitals:  Vitals:   05/31/17 1415 05/31/17 1425  BP: (!) 165/86 (!) 165/86  Pulse: 66 66  Resp: 11 10  Temp:  36.5 C  SpO2: 93% 94%    Last Pain:  Vitals:   05/31/17 1309  TempSrc:   PainSc: [place]

## 2017-05-31 NOTE — Op Note (Signed)
NAMEMAAZ, SPIERING               ACCOUNT NO.:  [id number]  MEDICAL RECORD NO.:  [id number]  LOCATION:                                 FACILITY:  PHYSICIAN:  Kipp Brood. Ketina Mars, M.D.DATE OF BIRTH:  [birth year]  DATE OF PROCEDURE:  05/31/2017 DATE OF DISCHARGE:                              OPERATIVE REPORT   SURGEON:  Kipp Brood. Gladstone Lighter, M.D.  ASSISTANT:  Nurse.  PREOPERATIVE DIAGNOSIS:  Arthrofibrosis postop right total knee arthroplasty.  POSTOPERATIVE DIAGNOSIS:  Arthrofibrosis postop right total knee arthroplasty.  OPERATION:  Closed manipulation of a right total knee contracture.  PROCEDURE:  Under general anesthesia, routine time-out was carried out, also marked the appropriate right knee in the holding area.  At this time, under general anesthesia, a very gentle repetitive closed manipulation of his right total knee was carried out.  Great care was taken not to rupture the quadriceps tendon.  I started out at about 85 degrees of flexion.  We were able to flex him back to about 115 degrees to 120 degrees.  Note, I did examine his opposite left knee in the holding area.  His left knee that he never had surgery on was at the most flexed 90 degrees.  The procedure was a gentle manipulation as I mentioned.  I then extend his leg, exert some force down onto the patella to see if we could extend his knee somewhat more.  We then went through multiple gradual close manipulations of his knee.  There were no signs of any injuries to the knee.  Photographs were taken to show to the patient.  There were no injections into this total knee after the surgery.  At this time, the patient was released to the recovery room.          ______________________________ Kipp Brood Gladstone Lighter, M.D.     RAG/MEDQ  D:  05/31/2017  T:  05/31/2017  Job:  [id number]

## 2017-05-31 NOTE — Brief Op Note (Signed)
05/31/2017  1:08 PM  PATIENT:  Duane Hughes  67 y.o. male  PRE-OPERATIVE DIAGNOSIS:  Arthrofibosis Right Total Knee  POST-OPERATIVE DIAGNOSIS:  Arthrofibosis Right Total Knee  PROCEDURE:  Procedure(s) with comments: CLOSED MANIPULATION RIGHT KNEE (Right) - Femoral Block  SURGEON:  Surgeon(s) and Role:    Latanya Maudlin, MD - Primary   ASSISTANTS: Nurse   ANESTHESIA:   general  EBL:  none   BLOOD ADMINISTERED:No Specimen  DRAINS: none   LOCAL MEDICATIONS USED:  NONE  SPECIMEN:  No Specimen  DISPOSITION OF SPECIMEN:  N/A  COUNTS:  YES  TOURNIQUET:  * No tourniquets in log *  DICTATION: .Other Dictation: Dictation Number [id number]  PLAN OF CARE: Discharge to home after PACU  PATIENT DISPOSITION:  PACU - hemodynamically stable.   Delay start of Pharmacological VTE agent (>24hrs) due to surgical blood loss or risk of bleeding: yes

## 2017-05-31 NOTE — Transfer of Care (Signed)
Immediate Anesthesia Transfer of Care Note  Patient: COUNCIL MUNGUIA  Procedure(s) Performed: CLOSED MANIPULATION RIGHT KNEE (Right Knee)  Patient Location: PACU  Anesthesia Type:General  Level of Consciousness: sedated  Airway & Oxygen Therapy: Patient Spontanous Breathing and Patient connected to face mask oxygen  Post-op Assessment: Report given to RN and Post -op Vital signs reviewed and stable  Post vital signs: Reviewed and stable  Last Vitals:  Vitals:   05/31/17 1233 05/31/17 1234  BP:  (!) 151/87  Pulse: 71   Resp: 14   Temp:    SpO2: 96%     Last Pain:  Vitals:   05/31/17 1139  TempSrc:   PainSc: 2       Patients Stated Pain Goal: 4 (05/09/30 3557)  Complications: No apparent anesthesia complications

## 2017-05-31 NOTE — Addendum Note (Signed)
Addendum  created 05/31/17 1439 by Barnet Glasgow, MD   Sign clinical note

## 2017-06-01 ENCOUNTER — Encounter (HOSPITAL_COMMUNITY): Payer: Self-pay | Admitting: Orthopedic Surgery

## 2017-06-01 DIAGNOSIS — T8482XA Fibrosis due to internal orthopedic prosthetic devices, implants and grafts, initial encounter: Secondary | ICD-10-CM | POA: Diagnosis not present

## 2017-06-01 LAB — GLUCOSE, CAPILLARY: Glucose-Capillary: 106 mg/dL — ABNORMAL HIGH (ref 65–99)

## 2017-06-01 MED ORDER — METHOCARBAMOL 500 MG PO TABS: 500.0000 mg | ORAL_TABLET | Freq: Four times a day (QID) | ORAL | 0 refills | Status: DC | PRN

## 2017-06-01 MED ORDER — OXYCODONE-ACETAMINOPHEN 5-325 MG PO TABS: 1.0000 | ORAL_TABLET | Freq: Four times a day (QID) | ORAL | 0 refills | Status: DC | PRN

## 2017-06-01 NOTE — Progress Notes (Signed)
Physical Therapy Treatment Patient Details Name: Duane Hughes MRN: [id number] DOB: 25-Oct-1950 Today's Date: 06/01/2017    History of Present Illness 67yo M s/p R TKA in 12/18, noew S/P manipulation with femoral nerve block with  ongoing quad weakness    PT Comments    Quads active. Reviewed various strategies to stretch quads and instructed in  Theraband resistive knee flexion. Ready for DC.   Follow Up Recommendations  DC plan and follow up therapy as arranged by surgeon     Equipment Recommendations  None recommended by PT    Recommendations for Other Services       Precautions / Restrictions Precautions Precautions: Fall;Knee    Mobility  Bed Mobility Overal bed mobility: Modified Independent                Transfers Overall transfer level: Needs assistance Equipment used: Rolling walker (2 wheeled) Transfers: Sit to/from Stand Sit to Stand: Supervision         General transfer comment: no issues with right knee buckling  Ambulation/Gait Ambulation/Gait assistance: Supervision Ambulation Distance (Feet): 100 Feet Assistive device: Rolling walker (2 wheeled) Gait Pattern/deviations: Step-to pattern;Step-through pattern         Stairs Stairs: Yes   Stair Management: Backwards;With walker Number of Stairs: 2 General stair comments: wife present   Wheelchair Mobility    Modified [place] (Stroke Patients Only)       Balance                                            Cognition Arousal/Alertness: Awake/alert                                            Exercises Total Joint Exercises Knee Flexion: AROM;Seated;Right;10 reps Goniometric ROM: 10-105 right knee flexion    General Comments        Pertinent Vitals/Pain Pain Assessment: 0-10 Pain Score: 2  Pain Descriptors / Indicators: Tightness Pain Intervention(s): Monitored during session    Home Living                      Prior  Function            PT Goals (current goals can now be found in the care plan section) Progress towards PT goals: Progressing toward goals    Frequency    7X/week      PT Plan Current plan remains appropriate    Co-evaluation              AM-PAC PT "6 Clicks" Daily Activity  Outcome Measure  Difficulty turning over in bed (including adjusting bedclothes, sheets and blankets)?: None Difficulty moving from lying on back to sitting on the side of the bed? : None Difficulty sitting down on and standing up from a chair with arms (e.g., wheelchair, bedside commode, etc,.)?: None Help needed moving to and from a bed to chair (including a wheelchair)?: A Little Help needed walking in hospital room?: A Little Help needed climbing 3-5 steps with a railing? : A Little 6 Click Score: 21    End of Session   Activity Tolerance: Patient tolerated treatment well Patient left: in chair;with family/visitor present Nurse Communication: Mobility status PT Visit Diagnosis: Unsteadiness on feet (R26.81);Difficulty  in walking, not elsewhere classified (R26.2)     Time: 9012-2241 PT Time Calculation (min) (ACUTE ONLY): 27 min  Charges:  $Gait Training: 8-22 mins $Therapeutic Exercise: 8-22 mins                    G Codes:         Claretha Cooper 06/01/2017, 1:58 PM

## 2017-06-01 NOTE — Progress Notes (Signed)
   Subjective: 1 Day Post-Op Procedure(s) (LRB): CLOSED MANIPULATION RIGHT KNEE (Right) Patient reports pain as mild.   Patient seen in rounds for Dr. Gladstone Lighter Patient is well, and has had no acute complaints or problems. No issues overnight. Reports that his outpatient therapy appointment was supposed to be at 10am this morning. Voiding well. Positive flatus.    Objective: Vital signs in last 24 hours: Temp:  [97.5 F (36.4 C)-98.9 F (37.2 C)] 98.5 F (36.9 C) (02/01 0525) Pulse Rate:  [64-79] 78 (02/01 0525) Resp:  [0-19] 18 (02/01 0525) BP: (130-169)/(61-97) 141/87 (02/01 0525) SpO2:  [93 %-100 %] 94 % (02/01 0525) Weight:  [117.9 kg (260 lb)] 117.9 kg (260 lb) (01/31 1108)  Intake/Output from previous day:  Intake/Output Summary (Last 24 hours) at 06/01/2017 0733 Last data filed at 06/01/2017 0350 Gross per 24 hour  Intake 1670 ml  Output 2775 ml  Net -1105 ml     EXAM General - Patient is Alert and Oriented Extremity - Neurologically intact Intact pulses distally Dorsiflexion/Plantar flexion intact No cellulitis present Compartment soft Motor Function - intact, moving foot and toes well on exam.   Past Medical History:  Diagnosis Date  . Anxiety   . Arthritis    back, hands, knees  . Colon polyp 05/09/2016   TUBULAR ADENOMAS   . Depression   . Diabetes mellitus without complication ([place])    type 2  . Ectatic abdominal aorta ([place]) 04/18/2016   2.9 cm on [place] 2016, unchanged 2017  . History of skin cancer    hand  . Hypertension   . Morbid obesity ([place]) 11/11/2014  . Obesity 11/11/2014  . Sleep apnea    cpap    Assessment/Plan: 1 Day Post-Op Procedure(s) (LRB): CLOSED MANIPULATION RIGHT KNEE (Right) Active Problems:   Arthrofibrosis of knee joint, right  Estimated body mass index is 38.4 kg/m as calculated from the following:   Height as of this encounter: 5\' 9"  (1.753 m).   Weight as of this encounter: 117.9 kg (260 lb). Advance diet Up with  therapy D/C IV fluids Weight-Bearing as tolerated  DC home today with CPM and plan to start outpatient therapy this afternoon or Monday depending on availability. Follow up in office in 1 week.   Ardeen Jourdain, PA-C Orthopaedic Surgery 06/01/2017, 7:33 AM

## 2017-06-01 NOTE — Progress Notes (Signed)
Reviewed AVS and answered all questions for wife and patient.   Discharged without complaint.

## 2017-06-04 DIAGNOSIS — Z96651 Presence of right artificial knee joint: Secondary | ICD-10-CM | POA: Diagnosis not present

## 2017-06-04 NOTE — Discharge Summary (Signed)
Physician Discharge Summary   Patient ID: Duane Hughes MRN: [id number] DOB/AGE: 11-19-1950 67 y.o.  Admit date: 05/31/2017 Discharge date: 06/01/2017  Primary Diagnosis: Arthrofibrosis right total knee   Admission Diagnoses:  Past Medical History:  Diagnosis Date  . Anxiety   . Arthritis    back, hands, knees  . Colon polyp 05/09/2016   TUBULAR ADENOMAS   . Depression   . Diabetes mellitus without complication ([place])    type 2  . Ectatic abdominal aorta ([place]) 04/18/2016   2.9 cm on [place] 2016, unchanged 2017  . History of skin cancer    hand  . Hypertension   . Morbid obesity ([place]) 11/11/2014  . Obesity 11/11/2014  . Sleep apnea    cpap   Discharge Diagnoses:   Active Problems:   Arthrofibrosis of knee joint, right  Estimated body mass index is 38.4 kg/m as calculated from the following:   Height as of this encounter: 5\' 9"  (1.753 m).   Weight as of this encounter: 117.9 kg (260 lb).  Procedure:  Procedure(s) (LRB): CLOSED MANIPULATION RIGHT KNEE (Right)   Consults: None  HPI: The patient is a 67 year old male who has been followed post-operatively for his right total knee. Unfortunately, despite continued therapy, he did not achieve adequate ROM of his right knee, specifically, flexion. It was decided between the patient and Dr. Gladstone Lighter that he would undergo a closed manipulation of his right total knee in an attempt to gain some increased flexion.    Laboratory Data: Admission on 05/31/2017, Discharged on 06/01/2017  Component Date Value Ref Range Status  . Hgb A1c MFr Bld 05/31/2017 6.2* 4.8 - 5.6 % Final   Comment: (NOTE) Pre diabetes:          5.7%-6.4% Diabetes:              >6.4% Glycemic control for   <7.0% adults with diabetes   . Mean Plasma Glucose 05/31/2017 131.24  mg/dL Final   Performed at [facility], [place] [address] [place], [place] [address]  . Glucose-Capillary 05/31/2017 117* 65 - 99 mg/dL Final  . Glucose-Capillary 05/31/2017 98  65  - 99 mg/dL Final  . Comment 1 05/31/2017 Notify RN   Final  . Comment 2 05/31/2017 Document in Chart   Final  . Glucose-Capillary 05/31/2017 95  65 - 99 mg/dL Final  . Glucose-Capillary 05/31/2017 102* 65 - 99 mg/dL Final  . Glucose-Capillary 06/01/2017 106* 65 - 99 mg/dL Final  Office Visit on 05/03/2017  Component Date Value Ref Range Status  . WBC 05/03/2017 6.0  3.8 - 10.8 Thousand/uL Final  . RBC 05/03/2017 4.21  4.20 - 5.80 Million/uL Final  . Hemoglobin 05/03/2017 13.3  13.2 - 17.1 g/dL Final  . HCT 05/03/2017 38.3* 38.5 - 50.0 % Final  . MCV 05/03/2017 91.0  80.0 - 100.0 fL Final  . MCH 05/03/2017 31.6  27.0 - 33.0 pg Final  . MCHC 05/03/2017 34.7  32.0 - 36.0 g/dL Final  . RDW 05/03/2017 11.8  11.0 - 15.0 % Final  . Platelets 05/03/2017 319  140 - 400 Thousand/uL Final  . MPV 05/03/2017 9.1  7.5 - 12.5 fL Final  . Neutro Abs 05/03/2017 4,020  1,500 - 7,800 cells/uL Final  . Lymphs Abs 05/03/2017 1,290  850 - 3,900 cells/uL Final  . WBC mixed population 05/03/2017 438  200 - 950 cells/uL Final  . Eosinophils Absolute 05/03/2017 210  15 - 500 cells/uL Final  .  Basophils Absolute 05/03/2017 42  0 - 200 cells/uL Final  . Neutrophils Relative % 05/03/2017 67  % Final  . Total Lymphocyte 05/03/2017 21.5  % Final  . Monocytes Relative 05/03/2017 7.3  % Final  . Eosinophils Relative 05/03/2017 3.5  % Final  . Basophils Relative 05/03/2017 0.7  % Final  . Iron 05/03/2017 47* 50 - 180 mcg/dL Final  . TIBC 05/03/2017 342  250 - 425 mcg/dL (calc) Final  . %SAT 05/03/2017 14* 15 - 60 % (calc) Final  . Ferritin 05/03/2017 106  20 - 380 ng/mL Final  . Glucose, Bld 05/03/2017 98  65 - 99 mg/dL Final   Comment: .            Fasting reference interval .   . BUN 05/03/2017 17  7 - 25 mg/dL Final  . Creat 05/03/2017 0.98  0.70 - 1.25 mg/dL Final   Comment: For patients >54 years of age, the reference limit for Creatinine is approximately 13% higher for people identified as  African-American. .   . GFR, Est Non African American 05/03/2017 79  > OR = 60 mL/min/1.47m2 Final  . GFR, Est African American 05/03/2017 92  > OR = 60 mL/min/1.8m2 Final  . BUN/Creatinine Ratio 34/19/6222 NOT APPLICABLE  6 - 22 (calc) Final  . Sodium 05/03/2017 138  135 - 146 mmol/L Final  . Potassium 05/03/2017 4.2  3.5 - 5.3 mmol/L Final  . Chloride 05/03/2017 102  98 - 110 mmol/L Final  . CO2 05/03/2017 26  20 - 32 mmol/L Final  . Calcium 05/03/2017 9.2  8.6 - 10.3 mg/dL Final  . Total Protein 05/03/2017 6.7  6.1 - 8.1 g/dL Final  . Albumin 05/03/2017 4.1  3.6 - 5.1 g/dL Final  . Globulin 05/03/2017 2.6  1.9 - 3.7 g/dL (calc) Final  . AG Ratio 05/03/2017 1.6  1.0 - 2.5 (calc) Final  . Total Bilirubin 05/03/2017 0.3  0.2 - 1.2 mg/dL Final  . Alkaline phosphatase (APISO) 05/03/2017 86  40 - 115 U/L Final  . AST 05/03/2017 24  10 - 35 U/L Final  . ALT 05/03/2017 27  9 - 46 U/L Final  . TSH 05/03/2017 1.18  0.40 - 4.50 mIU/L Final  . Vit D, 25-Hydroxy 05/03/2017 32  30 - 100 ng/mL Final   Comment: Vitamin D Status         25-OH Vitamin D: . Deficiency:                    <20 ng/mL Insufficiency:             20 - 29 ng/mL Optimal:                 > or = 30 ng/mL . For 25-OH Vitamin D testing on patients on  D2-supplementation and patients for whom quantitation  of D2 and D3 fractions is required, the QuestAssureD(TM) 25-OH VIT D, (D2,D3), LC/MS/MS is recommended: order  code [id number] (patients >28yrs). . For more information on this test, go to: http://education.questdiagnostics.com/faq/FAQ163 (This link is being provided for  informational/educational purposes only.)      EKG: Orders placed or performed in visit on 03/08/17  . EKG 12-Lead     Hospital Course: Duane Hughes is a 67 y.o. who was admitted to [facility]. They were brought to the operating room on 05/31/2017 and underwent Procedure(s): CLOSED MANIPULATION RIGHT KNEE.  Patient tolerated the procedure  well and was later transferred  to the recovery room and then to the orthopaedic floor for postoperative care.  They were given PO and IV analgesics for pain control following their surgery.  They were given 24 hours of postoperative antibiotics of  Anti-infectives (From admission, onward)   None     and started on DVT prophylaxis in the form of Aspirin.   PT was ordered. Plan was for the patient to go home same day as the procedure but he was unable to get up with therapy due to the block.  Discharge planning consulted to help with postop disposition and equipment needs.  Patient had a good night on the evening of surgery.  They started to get up OOB with therapy on day one. Patient was seen in rounds and was ready to go home.   Diet: Cardiac diet and Diabetic diet Activity:WBAT Follow-up:in 7 days Disposition - Home Discharged Condition: stable   Discharge Instructions    Call MD / Call 911   Complete by:  As directed    If you experience chest pain or shortness of breath, CALL 911 and be transported to the hospital emergency room.  If you develope a fever above 101 F, pus (white drainage) or increased drainage or redness at the wound, or calf pain, call your surgeon's office.   Constipation Prevention   Complete by:  As directed    Drink plenty of fluids.  Prune juice may be helpful.  You may use a stool softener, such as Colace (over the counter) 100 mg twice a day.  Use MiraLax (over the counter) for constipation as needed.   Diet - low sodium heart healthy   Complete by:  As directed    Diet Carb Modified   Complete by:  As directed    Increase activity slowly as tolerated   Complete by:  As directed      Allergies as of 06/01/2017   No Known Allergies     Medication List    TAKE these medications   aspirin EC 325 MG tablet Take 1 tablet (325 mg total) by mouth 2 (two) times daily. What changed:    when to take this  Another medication with the same name was removed.  Continue taking this medication, and follow the directions you see here.   atorvastatin 10 MG tablet Commonly known as:  LIPITOR TAKE 1 TABLET BY MOUTH EVERY NIGHT AT BEDTIME What changed:    how much to take  how to take this  when to take this   buPROPion 150 MG 12 hr tablet Commonly known as:  WELLBUTRIN SR TAKE 1 TABLET BY MOUTH TWICE A DAY What changed:    how much to take  how to take this  when to take this   diphenhydramine-acetaminophen 25-500 MG Tabs tablet Commonly known as:  TYLENOL PM Take 2 tablets by mouth at bedtime as needed (sleep).   EQL VITAMIN D3 2000 units Caps Generic drug:  Cholecalciferol Take 2,000 Units by mouth daily.   lamoTRIgine 150 MG tablet Commonly known as:  LAMICTAL Take 150 mg by mouth daily.   lisinopril 5 MG tablet Commonly known as:  PRINIVIL,ZESTRIL TAKE 1/2 TABLET BY MOUTH DAILY. What changed:    how much to take  how to take this  when to take this   magnesium oxide 400 MG tablet Commonly known as:  MAG-OX Take 400 mg by mouth daily.   MELATONIN GUMMIES PO Take by mouth at bedtime as needed.   metFORMIN 500  MG tablet Commonly known as:  GLUCOPHAGE TAKE 1 TABLET BY MOUTH 2 TIMES DAILY WITH A MEAL.   methocarbamol 500 MG tablet Commonly known as:  ROBAXIN Take 1 tablet (500 mg total) by mouth every 6 (six) hours as needed for muscle spasms.   MULTIVITAMIN ADULT PO Take 1 tablet by mouth daily.   oxyCODONE-acetaminophen 5-325 MG tablet Commonly known as:  PERCOCET/ROXICET Take 1 tablet by mouth every 6 (six) hours as needed for severe pain.   sildenafil 20 MG tablet Commonly known as:  REVATIO Take 3 to 5 tablets two hours before intercouse on an empty stomach.  Do not take with nitrates. What changed:    how much to take  how to take this  when to take this  reasons to take this  additional instructions   Turmeric 500 MG Caps Take 500 mg by mouth daily.      Follow-up Information     Latanya Maudlin, MD. Schedule an appointment as soon as possible for a visit in 1 week(s).   Specialty:  Orthopedic Surgery Contact information: [address] [place] [address] [phone]           Signed: Ardeen Jourdain, PA-C Orthopaedic Surgery 06/04/2017, 7:11 AM

## 2017-06-06 DIAGNOSIS — Z96651 Presence of right artificial knee joint: Secondary | ICD-10-CM | POA: Diagnosis not present

## 2017-06-17 NOTE — Telephone Encounter (Signed)
Routed to office manager on Nov 30, 2016  Patient's needs have been met by now; signing off on unsigned [place] On From To Message Type   11/30/2016 9:46 AM Lada, Satira Anis, MD Mock, Andria Frames   Created by   Mychart, Generic on 11/29/2016 07:24 PM

## 2017-06-17 NOTE — Telephone Encounter (Signed)
Message routed to colleague in Sept 2018 Patient's need appears to be met Closing out this old Mychart message

## 2017-06-17 NOTE — Progress Notes (Signed)
Signing off on old note

## 2017-06-17 NOTE — Progress Notes (Signed)
Closing out old note

## 2017-06-17 NOTE — Telephone Encounter (Signed)
I found an open MyChart message Routed to colleague Rx was indeed refilled by specialist Closing out note since patient's need was met

## 2017-06-18 DIAGNOSIS — Z96651 Presence of right artificial knee joint: Secondary | ICD-10-CM | POA: Diagnosis not present

## 2017-06-20 DIAGNOSIS — Z96651 Presence of right artificial knee joint: Secondary | ICD-10-CM | POA: Diagnosis not present

## 2017-06-25 DIAGNOSIS — Z96651 Presence of right artificial knee joint: Secondary | ICD-10-CM | POA: Diagnosis not present

## 2017-06-26 DIAGNOSIS — R69 Illness, unspecified: Secondary | ICD-10-CM | POA: Diagnosis not present

## 2017-07-05 DIAGNOSIS — M25562 Pain in left knee: Secondary | ICD-10-CM | POA: Diagnosis not present

## 2017-07-05 DIAGNOSIS — M1712 Unilateral primary osteoarthritis, left knee: Secondary | ICD-10-CM | POA: Diagnosis not present

## 2017-07-17 DIAGNOSIS — R69 Illness, unspecified: Secondary | ICD-10-CM | POA: Diagnosis not present

## 2017-07-23 ENCOUNTER — Other Ambulatory Visit: Payer: Self-pay | Admitting: Family Medicine

## 2017-07-24 NOTE — Telephone Encounter (Signed)
Request received for bupropion He should be getting his medicines for mood and depression from his psychiatrist please

## 2017-07-25 DIAGNOSIS — Z96651 Presence of right artificial knee joint: Secondary | ICD-10-CM | POA: Diagnosis not present

## 2017-08-02 ENCOUNTER — Telehealth: Payer: Self-pay | Admitting: Family Medicine

## 2017-08-02 DIAGNOSIS — R69 Illness, unspecified: Secondary | ICD-10-CM | POA: Diagnosis not present

## 2017-08-02 NOTE — Telephone Encounter (Signed)
Pt notified that there were no TDAPs recorded in our system. Pt verbalized understanding.

## 2017-08-02 NOTE — Telephone Encounter (Signed)
Copied from [place] [id number]. Topic: Quick Communication - See Telephone Encounter >> Aug 02, 2017 10:38 AM Synthia Innocent wrote: CRM for notification. See Telephone encounter for: 08/02/17. Patient would like to know how many tetanus injections he has had. Please advise

## 2017-08-13 ENCOUNTER — Other Ambulatory Visit: Payer: Self-pay | Admitting: Family Medicine

## 2017-10-29 DIAGNOSIS — G629 Polyneuropathy, unspecified: Secondary | ICD-10-CM | POA: Insufficient documentation

## 2017-11-12 ENCOUNTER — Telehealth: Payer: Self-pay

## 2017-11-12 MED ORDER — METFORMIN HCL 500 MG PO TABS: 500.0000 mg | ORAL_TABLET | Freq: Two times a day (BID) | ORAL | 0 refills | Status: DC

## 2017-11-12 NOTE — Telephone Encounter (Signed)
Lab Results  Component Value Date   HGBA1C 6.2 (H) 05/31/2017   Please contact patient With his diabetes, we should be seeing him every six months Last appointment with me was May 03, 2017; please schedule first available Thank you

## 2017-11-13 ENCOUNTER — Other Ambulatory Visit: Payer: Self-pay | Admitting: Family Medicine

## 2017-11-13 DIAGNOSIS — R69 Illness, unspecified: Secondary | ICD-10-CM | POA: Diagnosis not present

## 2017-11-13 NOTE — Telephone Encounter (Signed)
Spoke with pt and appt made for 11-27-17

## 2017-11-14 ENCOUNTER — Other Ambulatory Visit: Payer: Self-pay | Admitting: Family Medicine

## 2017-11-26 DIAGNOSIS — H40033 Anatomical narrow angle, bilateral: Secondary | ICD-10-CM | POA: Diagnosis not present

## 2017-11-26 DIAGNOSIS — E119 Type 2 diabetes mellitus without complications: Secondary | ICD-10-CM | POA: Diagnosis not present

## 2017-11-26 DIAGNOSIS — H524 Presbyopia: Secondary | ICD-10-CM | POA: Diagnosis not present

## 2017-11-26 LAB — HM DIABETES EYE EXAM

## 2017-11-27 ENCOUNTER — Ambulatory Visit (INDEPENDENT_AMBULATORY_CARE_PROVIDER_SITE_OTHER): Payer: Medicare HMO | Admitting: Family Medicine

## 2017-11-27 ENCOUNTER — Encounter: Payer: Self-pay | Admitting: Family Medicine

## 2017-11-27 VITALS — BP 126/64 | HR 98 | Temp 98.2°F | Resp 12 | Ht 69.0 in | Wt 253.2 lb

## 2017-11-27 DIAGNOSIS — E119 Type 2 diabetes mellitus without complications: Secondary | ICD-10-CM | POA: Diagnosis not present

## 2017-11-27 DIAGNOSIS — G8929 Other chronic pain: Secondary | ICD-10-CM

## 2017-11-27 DIAGNOSIS — E785 Hyperlipidemia, unspecified: Secondary | ICD-10-CM

## 2017-11-27 DIAGNOSIS — M25552 Pain in left hip: Secondary | ICD-10-CM

## 2017-11-27 DIAGNOSIS — Z5181 Encounter for therapeutic drug level monitoring: Secondary | ICD-10-CM | POA: Diagnosis not present

## 2017-11-27 DIAGNOSIS — I10 Essential (primary) hypertension: Secondary | ICD-10-CM | POA: Diagnosis not present

## 2017-11-27 DIAGNOSIS — M25512 Pain in left shoulder: Secondary | ICD-10-CM | POA: Diagnosis not present

## 2017-11-27 DIAGNOSIS — I77811 Abdominal aortic ectasia: Secondary | ICD-10-CM

## 2017-11-27 MED ORDER — ATORVASTATIN CALCIUM 10 MG PO TABS: 10.0000 mg | ORAL_TABLET | Freq: Every day | ORAL | 3 refills | Status: DC

## 2017-11-27 NOTE — Assessment & Plan Note (Signed)
Praise given for weight loss 

## 2017-11-27 NOTE — Assessment & Plan Note (Signed)
Limit saturated fats; continue statin; goal LDL is less than 70 

## 2017-11-27 NOTE — Assessment & Plan Note (Signed)
Well-controlled today; limit salt 

## 2017-11-27 NOTE — Patient Instructions (Addendum)
Try to limit saturated fats in your diet (bologna, hot dogs, barbeque, cheeseburgers, hamburgers, steak, bacon, sausage, cheese, etc.) and get more fresh fruits, vegetables, and whole grains  If you need something else for aches or pains, try to use Tylenol (acetaminophen) instead of non-steroidals (which include Aleve, ibuprofen, Advil, Motrin, and naproxen)  Try turmeric as a natural anti-inflammatory (for pain and arthritis). It comes in capsules where you buy aspirin and fish oil, but also as a spice where you buy pepper and garlic powder. Try topical agents like Biofreeze  We'll get labs If you have not heard anything from my staff in a week about any orders/referrals/studies from today, please contact us here to follow-up [phone]  Check out the information at familydoctor.org entitled "Nutrition for Weight Loss: What You Need to Know about Fad Diets" Try to lose between 1-2 pounds per week by taking in fewer calories and burning off more calories You can succeed by limiting portions, limiting foods dense in calories and fat, becoming more active, and drinking 8 glasses of water a day (64 ounces) Don't skip meals, especially breakfast, as skipping meals may alter your metabolism Do not use over-the-counter weight loss pills or gimmicks that claim rapid weight loss A healthy BMI (or body mass index) is between 18.5 and 24.9 You can calculate your ideal BMI at the [place] website ClubMon[employer].fr   Obesity, Adult Obesity is the condition of having too much total body fat. Being overweight or obese means that your weight is greater than what is considered healthy for your body size. Obesity is determined by a measurement called BMI. BMI is an estimate of body fat and is calculated from height and weight. For adults, a BMI of 30 or higher is considered obese. Obesity can eventually lead to other health concerns and major illnesses,  including:  Stroke.  Coronary artery disease (CAD).  Type 2 diabetes.  Some types of cancer, including cancers of the colon, breast, uterus, and gallbladder.  Osteoarthritis.  High blood pressure (hypertension).  High cholesterol.  Sleep apnea.  Gallbladder stones.  Infertility problems.  What are the causes? The main cause of obesity is taking in (consuming) more calories than your body uses for energy. Other factors that contribute to this condition may include:  Being born with genes that make you more likely to become obese.  Having a medical condition that causes obesity. These conditions include: ? Hypothyroidism. ? Polycystic ovarian syndrome (PCOS). ? Binge-eating disorder. ? Cushing syndrome.  Taking certain medicines, such as steroids, antidepressants, and seizure medicines.  Not being physically active (sedentary lifestyle).  Living where there are limited places to exercise safely or buy healthy foods.  Not getting enough sleep.  What increases the risk? The following factors may increase your risk of this condition:  Having a family history of obesity.  Being a woman of African-American descent.  Being a man of Hispanic descent.  What are the signs or symptoms? Having excessive body fat is the main symptom of this condition. How is this diagnosed? This condition may be diagnosed based on:  Your symptoms.  Your medical history.  A physical exam. Your health care provider may measure: ? Your BMI. If you are an adult with a BMI between 25 and less than 30, you are considered overweight. If you are an adult with a BMI of 30 or higher, you are considered obese. ? The distances around your hips and your waist (circumferences). These may be compared to each other  to help diagnose your condition. ? Your skinfold thickness. Your health care provider may gently pinch a fold of your skin and measure it.  How is this treated? Treatment for this  condition often includes changing your lifestyle. Treatment may include some or all of the following:  Dietary changes. Work with your health care provider and a dietitian to set a weight-loss goal that is healthy and reasonable for you. Dietary changes may include eating: ? Smaller portions. A portion size is the amount of a particular food that is healthy for you to eat at one time. This varies from person to person. ? Low-calorie or low-fat options. ? More whole grains, fruits, and vegetables.  Regular physical activity. This may include aerobic activity (cardio) and strength training.  Medicine to help you lose weight. Your health care provider may prescribe medicine if you are unable to lose 1 pound a week after 6 weeks of eating more healthily and doing more physical activity.  Surgery. Surgical options may include gastric banding and gastric bypass. Surgery may be done if: ? Other treatments have not helped to improve your condition. ? You have a BMI of 40 or higher. ? You have life-threatening health problems related to obesity.  Follow these instructions at home:  Eating and drinking   Follow recommendations from your health care provider about what you eat and drink. Your health care provider may advise you to: ? Limit fast foods, sweets, and processed snack foods. ? Choose low-fat options, such as low-fat milk instead of whole milk. ? Eat 5 or more servings of fruits or vegetables every day. ? Eat at home more often. This gives you more control over what you eat. ? Choose healthy foods when you eat out. ? Learn what a healthy portion size is. ? Keep low-fat snacks on hand. ? Avoid sugary drinks, such as soda, fruit juice, iced tea sweetened with sugar, and flavored milk. ? Eat a healthy breakfast.  Drink enough water to keep your urine clear or pale yellow.  Do not go without eating for long periods of time (do not fast) or follow a fad diet. Fasting and fad diets can be  unhealthy and even dangerous. Physical Activity  Exercise regularly, as told by your health care provider. Ask your health care provider what types of exercise are safe for you and how often you should exercise.  Warm up and stretch before being active.  Cool down and stretch after being active.  Rest between periods of activity. Lifestyle  Limit the time that you spend in front of your TV, computer, or video game system.  Find ways to reward yourself that do not involve food.  Limit alcohol intake to no more than 1 drink a day for nonpregnant women and 2 drinks a day for men. One drink equals 12 oz of beer, 5 oz of wine, or 1 oz of hard liquor. General instructions  Keep a weight loss journal to keep track of the food you eat and how much you exercise you get.  Take over-the-counter and prescription medicines only as told by your health care provider.  Take vitamins and supplements only as told by your health care provider.  Consider joining a support group. Your health care provider may be able to recommend a support group.  Keep all follow-up visits as told by your health care provider. This is important. Contact a health care provider if:  You are unable to meet your weight loss goal after 6  weeks of dietary and lifestyle changes. This information is not intended to replace advice given to you by your health care provider. Make sure you discuss any questions you have with your health care provider. Document Released: 05/25/2004 Document Revised: 09/20/2015 Document Reviewed: 02/03/2015 Elsevier Interactive Patient Education  2018 [employer].

## 2017-11-27 NOTE — Progress Notes (Signed)
BP 126/64   Pulse 98   Temp 98.2 F (36.8 C) (Oral)   Resp 12   Ht 5\' 9"  (1.753 m)   Wt 253 lb 3.2 oz (114.9 kg)   SpO2 94%   BMI 37.39 kg/m    Subjective:    Patient ID: Duane Hughes, male    DOB: [birth year], 67 y.o.   MRN: [id number]  HPI: Duane Hughes is a 67 y.o. male  Chief Complaint  Patient presents with  . Follow-up  . Medication Refill  . Shoulder Pain    left  . Hip Pain    left    HPI  Type 2 diabetes; not checking FSBS with my blessing Lab Results  Component Value Date   HGBA1C 6.2 (H) 05/31/2017    HTN;  Shoulder pain and hip pain (left side); has ortho in [facility] Left shoulder; "it sucks"; fell on the golf course in [place], this summer; started to rain, traveling, in tennis shoes, usually in spike shoes, on a little incline and it's wet, he goes back to hit the shot and went down so fast on the left shoulder; hurt like heck; just hurts to move certain ways; can abduct the left arm only if thumb up; hurts and wakes him up; no cracking at time of injury; doing turmeric; using naproxen; off of oxycodone now  Left hip pain for 3 days; could hardly walk when it started; played golf, doesn't remember doing anything; getting better each day; walked on sand  Limitation in the left knee, may do replacement later this fall; hx of total replacement RIGHT knee and hx of partial replacement LEFt knee  High cholesterol; atorvastatin 10 mg; liberal diet; does not eat eggs often Lab Results  Component Value Date   CHOL 135 03/09/2017   HDL 37 (L) 03/09/2017   LDLCALC 74 03/09/2017   TRIG 159 (H) 03/09/2017   CHOLHDL 3.6 03/09/2017   Obesity; he was 248 pounds at the start of vacation; expected weight loss over the last several months  ED; using PDE5 inhibitor when needed; aware of nitrate danger when discussed today  Depression screen [facility] 2/9 11/27/2017 05/03/2017 03/08/2017 02/20/2017 12/07/2016  Decreased Interest - 0 2 0 3  Down, Depressed, Hopeless  3 0 2 0 3  PHQ - 2 Score 3 0 4 0 6  Altered sleeping 1 - 0 - 1  Tired, decreased energy 1 - 3 - 3  Change in appetite 0 - 0 - 0  Feeling bad or failure about yourself  1 - 0 - 1  Trouble concentrating 0 - - - 1  Moving slowly or fidgety/restless 0 - 0 - 0  Suicidal thoughts 0 - 0 - 1  PHQ-9 Score 6 - 7 - 13  Difficult doing work/chores Not difficult at all - Not difficult at all - Somewhat difficult    Relevant past medical, surgical, family and social history reviewed Past Medical History:  Diagnosis Date  . Anxiety   . Arthritis    back, hands, knees  . Colon polyp 05/09/2016   TUBULAR ADENOMAS   . Depression   . Diabetes mellitus without complication ([place])    type 2  . Ectatic abdominal aorta ([place]) 04/18/2016   2.9 cm on [place] 2016, unchanged 2017  . History of skin cancer    hand  . Hypertension   . Morbid obesity ([place]) 11/11/2014  . Obesity 11/11/2014  . Sleep apnea    cpap   Past  Surgical History:  Procedure Laterality Date  . COLONOSCOPY WITH PROPOFOL N/A 05/09/2016   Procedure: COLONOSCOPY WITH PROPOFOL;  Surgeon: Lucilla Lame, MD;  Location: [facility] ENDOSCOPY;  Service: Endoscopy;  Laterality: N/A;  . HERNIA REPAIR    . JOINT REPLACEMENT     04-04-17 Dr. Gladstone Lighter   Right knee  . KNEE ARTHROSCOPY     several scopes done  . KNEE CLOSED REDUCTION Right 05/31/2017   Procedure: CLOSED MANIPULATION RIGHT KNEE;  Surgeon: Latanya Maudlin, MD;  Location: [facility];  Service: Orthopedics;  Laterality: Right;  Femoral Block  . MEDIAL PARTIAL KNEE REPLACEMENT Left    2000s?  Marland Kitchen SKIN CANCER EXCISION  Feb 2017   removed from right hand  . 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 Alcohol abuse Mother   . Rheum arthritis Mother   . Stroke Maternal Grandmother   . Stroke Paternal Grandfather   .  Dementia Paternal Grandmother   . Cancer Neg Hx   . Diabetes Neg Hx   . Heart disease Neg Hx   . Hypertension Neg Hx   . Kidney disease Neg Hx   . Prostate cancer Neg Hx    Social History   Tobacco Use  . Smoking status: Never Smoker  . Smokeless tobacco: Never Used  Substance Use Topics  . Alcohol use: No    Alcohol/week: 0.0 oz  . Drug use: No    Interim medical history since last visit reviewed. Allergies and medications reviewed  Review of Systems Per HPI unless specifically indicated above     Objective:    BP 126/64   Pulse 98   Temp 98.2 F (36.8 C) (Oral)   Resp 12   Ht 5\' 9"  (1.753 m)   Wt 253 lb 3.2 oz (114.9 kg)   SpO2 94%   BMI 37.39 kg/m   Wt Readings from Last 3 Encounters:  11/27/17 253 lb 3.2 oz (114.9 kg)  05/31/17 260 lb (117.9 kg)  05/03/17 262 lb 3.2 oz (118.9 kg)    Physical Exam  Constitutional: He appears well-developed and well-nourished. No distress.  HENT:  Head: Normocephalic and atraumatic.  Eyes: EOM are normal. No scleral icterus.  Neck: No thyromegaly present.  Cardiovascular: Normal rate and regular rhythm.  Pulmonary/Chest: Effort normal and breath sounds normal.  Abdominal: Soft. Bowel sounds are normal. He exhibits no distension.  Musculoskeletal: He exhibits no edema.       Left shoulder: He exhibits decreased range of motion. He exhibits no deformity.  Neurological: Coordination normal.  Skin: Skin is warm and dry. No pallor.  Psychiatric: He has a normal mood and affect. His behavior is normal. Judgment and thought content normal.   Diabetic Foot Form - Detailed   Diabetic Foot Exam - detailed Diabetic Foot exam was performed with the following findings:  Yes 11/27/2017  3:09 PM  Visual Foot Exam completed.:  Yes  Pulse Foot Exam completed.:  Yes  Right Dorsalis Pedis:  Present Left Dorsalis Pedis:  Present  Sensory Foot Exam Completed.:  Yes Semmes-Weinstein Monofilament Test R Site 1-Great Toe:  Pos L Site  1-Great Toe:  Pos        Results for orders placed or performed in visit on 11/27/17  HM DIABETES EYE EXAM  Result Value Ref Range   HM Diabetic  Eye Exam No Retinopathy No Retinopathy      Assessment & Plan:   Problem List Items Addressed This Visit      Cardiovascular and Mediastinum   Ectatic abdominal aorta (HCC) (Chronic)    Goal is good BP control; LDL under 70; weight management; diabetes control      Relevant Medications   aspirin EC 81 MG tablet   atorvastatin (LIPITOR) 10 MG tablet   Benign hypertension (Chronic)    Well-controlled today; limit salt      Relevant Medications   aspirin EC 81 MG tablet   atorvastatin (LIPITOR) 10 MG tablet     Endocrine   Diabetes mellitus without complication (HCC) - Primary (Chronic)    Check A1c and urine microalbumin:Cr; praise given for weight loss      Relevant Medications   aspirin EC 81 MG tablet   atorvastatin (LIPITOR) 10 MG tablet   Other Relevant Orders   Hemoglobin A1C   Urine Microalbumin w/creat. ratio     Other   Medication monitoring encounter   Relevant Orders   COMPLETE METABOLIC PANEL WITH GFR   CBC with Differential/Platelet   Morbid obesity ([place])    Praise given for weight loss      Dyslipidemia    Limit saturated fats; continue statin; goal LDL is less than 70      Relevant Medications   atorvastatin (LIPITOR) 10 MG tablet   Other Relevant Orders   Lipid panel    Other Visit Diagnoses    Chronic left shoulder pain       will avoid meloxicam since he is on lithium and there is a drug-drug interaction with those two   Relevant Medications   aspirin EC 81 MG tablet   Other Relevant Orders   Ambulatory referral to Orthopedic Surgery   Acute hip pain, left       will avoid meloxicam since he is on lithium and there is a drug-drug interaction with those two   Relevant Orders   Ambulatory referral to Orthopedic Surgery       Follow up plan: Return in about 3 months (around 02/27/2018)  for follow-up visit with Dr. Sanda Klein.  An after-visit summary was printed and given to the patient at [place].  Please see the patient instructions which may contain other information and recommendations beyond what is mentioned above in the assessment and plan.  Meds ordered this encounter  Medications  . atorvastatin (LIPITOR) 10 MG tablet    Sig: Take 1 tablet (10 mg total) by mouth at bedtime.    Dispense:  90 tablet    Refill:  3    Maximum Refills Reached    Orders Placed This Encounter  Procedures  . Hemoglobin A1C  . Urine Microalbumin w/creat. ratio  . COMPLETE METABOLIC PANEL WITH GFR  . Lipid panel  . CBC with Differential/Platelet  . Ambulatory referral to Orthopedic Surgery

## 2017-11-27 NOTE — Assessment & Plan Note (Signed)
Goal is good BP control; LDL under 70; weight management; diabetes control

## 2017-11-27 NOTE — Assessment & Plan Note (Signed)
Check A1c and urine microalbumin:Cr; praise given for weight loss

## 2017-11-28 LAB — COMPLETE METABOLIC PANEL WITH GFR
AG Ratio: 2 (calc) (ref 1.0–2.5)
ALT: 27 U/L (ref 9–46)
AST: 22 U/L (ref 10–35)
Albumin: 4.1 g/dL (ref 3.6–5.1)
Alkaline phosphatase (APISO): 54 U/L (ref 40–115)
BUN: 19 mg/dL (ref 7–25)
CALCIUM: 9 mg/dL (ref 8.6–10.3)
CHLORIDE: 105 mmol/L (ref 98–110)
CO2: 25 mmol/L (ref 20–32)
CREATININE: 1.07 mg/dL (ref 0.70–1.25)
GFR, EST AFRICAN AMERICAN: 82 mL/min/{1.73_m2} (ref >=60)
GFR, EST NON AFRICAN AMERICAN: 71 mL/min/{1.73_m2} (ref >=60)
GLUCOSE: 105 mg/dL (ref 65–139)
Globulin: 2.1 g/dL (calc) (ref 1.9–3.7)
Potassium: 4.5 mmol/L (ref 3.5–5.3)
Sodium: 139 mmol/L (ref 135–146)
TOTAL PROTEIN: 6.2 g/dL (ref 6.1–8.1)
Total Bilirubin: 0.4 mg/dL (ref 0.2–1.2)

## 2017-11-28 LAB — MICROALBUMIN / CREATININE URINE RATIO
CREATININE, URINE: 138 mg/dL (ref 20–320)
MICROALB UR: 0.6 mg/dL
MICROALB/CREAT RATIO: 4 ug/mg{creat} (ref <30)

## 2017-11-28 LAB — CBC WITH DIFFERENTIAL/PLATELET
Basophils Absolute: 58 cells/uL (ref 0–200)
Basophils Relative: 0.8 %
EOS PCT: 5.5 %
Eosinophils Absolute: 396 cells/uL (ref 15–500)
HEMATOCRIT: 45.4 % (ref 38.5–50.0)
HEMOGLOBIN: 15.4 g/dL (ref 13.2–17.1)
Lymphs Abs: 1318 cells/uL (ref 850–3900)
MCH: 32 pg (ref 27.0–33.0)
MCHC: 33.9 g/dL (ref 32.0–36.0)
MCV: 94.4 fL (ref 80.0–100.0)
MPV: 9 fL (ref 7.5–12.5)
Monocytes Relative: 6.8 %
NEUTROS PCT: 68.6 %
Neutro Abs: 4939 cells/uL (ref 1500–7800)
Platelets: 271 10*3/uL (ref 140–400)
RBC: 4.81 10*6/uL (ref 4.20–5.80)
RDW: 12 % (ref 11.0–15.0)
Total Lymphocyte: 18.3 %
WBC mixed population: 490 cells/uL (ref 200–950)
WBC: 7.2 10*3/uL (ref 3.8–10.8)

## 2017-11-28 LAB — HEMOGLOBIN A1C
EAG (MMOL/L): 7.1 (calc)
HEMOGLOBIN A1C: 6.1 %{Hb} — AB (ref <5.7)
MEAN PLASMA GLUCOSE: 128 (calc)

## 2017-11-28 LAB — LIPID PANEL
CHOL/HDL RATIO: 3.7 (calc) (ref <5.0)
Cholesterol: 137 mg/dL (ref <200)
HDL: 37 mg/dL — ABNORMAL LOW (ref >40)
LDL CHOLESTEROL (CALC): 63 mg/dL
NON-HDL CHOLESTEROL (CALC): 100 mg/dL (ref <130)
TRIGLYCERIDES: 279 mg/dL — AB (ref <150)

## 2017-11-29 DIAGNOSIS — M25512 Pain in left shoulder: Secondary | ICD-10-CM | POA: Diagnosis not present

## 2017-11-29 DIAGNOSIS — M7062 Trochanteric bursitis, left hip: Secondary | ICD-10-CM | POA: Diagnosis not present

## 2017-12-10 ENCOUNTER — Other Ambulatory Visit: Payer: Self-pay | Admitting: Family Medicine

## 2017-12-10 DIAGNOSIS — M25512 Pain in left shoulder: Secondary | ICD-10-CM | POA: Diagnosis not present

## 2017-12-10 NOTE — Telephone Encounter (Signed)
Last Cr reviewed; Rx approved for metformin x 6 months

## 2017-12-28 DIAGNOSIS — R69 Illness, unspecified: Secondary | ICD-10-CM | POA: Diagnosis not present

## 2018-01-08 DIAGNOSIS — R69 Illness, unspecified: Secondary | ICD-10-CM | POA: Diagnosis not present

## 2018-01-19 ENCOUNTER — Encounter: Payer: Self-pay | Admitting: *Deleted

## 2018-01-28 ENCOUNTER — Other Ambulatory Visit: Payer: Self-pay | Admitting: Family Medicine

## 2018-02-05 ENCOUNTER — Ambulatory Visit: Payer: Medicare HMO | Admitting: Psychiatry

## 2018-02-05 ENCOUNTER — Encounter: Payer: Self-pay | Admitting: Psychiatry

## 2018-02-05 VITALS — BP 101/60 | HR 77 | Ht 69.0 in

## 2018-02-05 DIAGNOSIS — F329 Major depressive disorder, single episode, unspecified: Secondary | ICD-10-CM | POA: Diagnosis not present

## 2018-02-05 DIAGNOSIS — F32A Depression, unspecified: Secondary | ICD-10-CM

## 2018-02-05 DIAGNOSIS — R69 Illness, unspecified: Secondary | ICD-10-CM | POA: Diagnosis not present

## 2018-02-05 MED ORDER — PRAMIPEXOLE DIHYDROCHLORIDE 0.5 MG PO TABS: 0.5000 mg | ORAL_TABLET | Freq: Two times a day (BID) | ORAL | 1 refills | Status: DC

## 2018-02-05 MED ORDER — BUPROPION HCL ER (SR) 150 MG PO TB12: ORAL_TABLET | ORAL | 11 refills | Status: DC

## 2018-02-05 NOTE — Progress Notes (Signed)
Duane Hughes [id number] [birth year] 67 y.o.  Subjective:   Patient ID:  Duane Hughes is a 67 y.o. (DOB 02-26-1951) male.  Chief Complaint:  Chief Complaint  Patient presents with  . Follow-up    HPI Duane Hughes presents to the office today for follow-up of depression. He reports that his mood is "better...less anger... Less frustration" compared to last visit. He reports that his wife has commented that he is more involved in activities and using humor more. He indicates some improvement in mood. Reports that he does not sleep much due to work schedule and awakens at 5-6 am most days through the week. Denies difficulty falling or staying asleep when his schedule allows. Describes energy as "not bad" and has been napping less. Reports some improvement in motivation and taking care of things more quickly. No change in appetite. Reports that his concentration is "not great." He reports that he can focus when tasks require this. Denies passive death wishes. Denies SI.   Medications: I have reviewed the patient's current medications.  Allergies: No Known Allergies  Past Medical History:  Diagnosis Date  . Anxiety   . Arthritis    back, hands, knees  . Colon polyp 05/09/2016   TUBULAR ADENOMAS   . Depression   . Diabetes mellitus without complication ([place])    type 2  . Ectatic abdominal aorta ([place]) 04/18/2016   2.9 cm on [place] 2016, unchanged 2017  . History of skin cancer    hand  . Hypertension   . Morbid obesity ([place]) 11/11/2014  . Obesity 11/11/2014  . Sleep apnea    cpap    Past Surgical History:  Procedure Laterality Date  . COLONOSCOPY WITH PROPOFOL N/A 05/09/2016   Procedure: COLONOSCOPY WITH PROPOFOL;  Surgeon: Lucilla Lame, MD;  Location: [facility] END[facility];  Service: Endoscopy;  Laterality: N/A;  . HERNIA REPAIR    . JOINT REPLACEMENT     04-04-17 Dr. Gladstone Lighter   Right knee  . KNEE ARTHROSCOPY     several scopes done  . KNEE CLOSED REDUCTION Right 05/31/2017    Procedure: CLOSED MANIPULATION RIGHT KNEE;  Surgeon: Latanya Maudlin, MD;  [facility];  Service: Orthopedics;  Laterality: Right;  Femoral Block  . MEDIAL PARTIAL KNEE REPLACEMENT Left    2000s?  Marland Kitchen SKIN CANCER EXCISION  Feb 2017   removed from right hand  . TOTAL KNEE ARTHROPLASTY Right 04/04/2017   Procedure: RIGHT TOTAL KNEE ARTHROPLASTY;  Surgeon: Latanya Maudlin, MD;  Location: [facility];  Service: Orthopedics;  Laterality: Right;    Family History  Problem Relation Age of Onset  . Alcohol abuse Mother   . Rheum arthritis Mother   . Stroke Maternal Grandmother   . Stroke Paternal Grandfather   . Dementia Paternal Grandmother   . Cancer Neg Hx   . Diabetes Neg Hx   . Heart disease Neg Hx   . Hypertension Neg Hx   . Kidney disease Neg Hx   . Prostate cancer Neg Hx     Social History   Socioeconomic History  . Marital status: Married    Spouse name: Not on file  . Number of children: Not on file  . Years of education: Not on file  . Highest education level: Not on file  Occupational History  . Not on file  Social Needs  . Financial resource strain: Not on file  . Food insecurity:    Worry: Not on file    Inability: Not on file  .  Transportation needs:    Medical: Not on file    Non-medical: Not on file  Tobacco Use  . Smoking status: Never Smoker  . Smokeless tobacco: Never Used  Substance and Sexual Activity  . Alcohol use: No    Alcohol/week: 0.0 standard drinks  . Drug use: No  . Sexual activity: Yes    Partners: Female  Lifestyle  . Physical activity:    Days per week: Not on file    Minutes per session: Not on file  . Stress: Not on file  Relationships  . Social connections:    Talks on phone: Not on file    Gets together: Not on file    Attends religious service: Not on file    Active member of club or organization: Not on file    Attends meetings of clubs or organizations: Not on file    Relationship status: Not on file  . Intimate partner  violence:    Fear of current or ex partner: Not on file    Emotionally abused: Not on file    Physically abused: Not on file    Forced sexual activity: Not on file  Other Topics Concern  . Not on file  Social History Narrative  . Not on file    Past Medical History, Surgical history, Social history, and Family history were reviewed and updated as appropriate.   Please see review of systems for further details on the patient's review from today.   Review of Systems:  Review of Systems  Gastrointestinal: Negative for constipation, diarrhea and vomiting.  Musculoskeletal:       Reports that knee replacement is healing well. Reports other knee has improved as well.   Neurological: Positive for tremors. Negative for dizziness.       Reports difficulty with balance    Objective:   Physical Exam:  BP 101/60   Pulse 77   Ht 5\' 9"  (1.753 m)   BMI 37.39 kg/m   Physical Exam  Constitutional: He is oriented to person, place, and time. He appears well-developed. No distress.  Musculoskeletal: He exhibits no deformity.  Neurological: He is alert and oriented to person, place, and time. Coordination normal.  Psychiatric: His speech is normal and behavior is normal. Judgment and thought content normal. His mood appears not anxious. His affect is not angry, not blunt, not labile and not inappropriate. Cognition and memory are normal. He does not exhibit a depressed mood. He expresses no homicidal and no suicidal ideation. He expresses no suicidal plans and no homicidal plans.  Mood and affect present as mildly irritable.     Lab Review:     Component Value Date/Time   NA 139 11/27/2017 1419   NA 139 05/12/2015 1537   K 4.5 11/27/2017 1419   CL 105 11/27/2017 1419   CO2 25 11/27/2017 1419   GLUCOSE 105 11/27/2017 1419   BUN 19 11/27/2017 1419   BUN 15 05/12/2015 1537   CREATININE 1.07 11/27/2017 1419   CALCIUM 9.0 11/27/2017 1419   PROT 6.2 11/27/2017 1419   PROT 6.2 03/19/2015  0825   ALBUMIN 4.1 09/19/2016 1400   ALBUMIN 4.0 03/19/2015 0825   AST 22 11/27/2017 1419   ALT 27 11/27/2017 1419   ALKPHOS 46 09/19/2016 1400   BILITOT 0.4 11/27/2017 1419   BILITOT 0.5 03/19/2015 0825   GFRNONAA 71 11/27/2017 1419   GFRAA 82 11/27/2017 1419       Component Value Date/Time   WBC 7.2 11/27/2017  1419   RBC 4.81 11/27/2017 1419   HGB 15.4 11/27/2017 1419   HGB 14.8 06/20/2016 1458   HCT 45.4 11/27/2017 1419   HCT 43.6 06/20/2016 1458   PLT 271 11/27/2017 1419   PLT 260 06/20/2016 1458   MCV 94.4 11/27/2017 1419   MCV 93 06/20/2016 1458   MCH 32.0 11/27/2017 1419   MCHC 33.9 11/27/2017 1419   RDW 12.0 11/27/2017 1419   RDW 13.3 06/20/2016 1458   LYMPHSABS 1,318 11/27/2017 1419   LYMPHSABS 2.0 06/20/2016 1458   MONOABS 0.4 03/29/2017 1343   EOSABS 396 11/27/2017 1419   EOSABS 0.4 06/20/2016 1458   BASOSABS 58 11/27/2017 1419   BASOSABS 0.1 06/20/2016 1458     Assessment: Plan:   Patient seen for 30 minutes and greater than 50% of visit spent counseling patient regarding treatment options.  Will increase Pramipexole to 0.5 mg po BID to improve residual depressive signs and symptoms. Will D/C Lithium since patient has tolerated dose reductions without any difficulty and denies any current suicidal thoughts.  Also patient started to report tremor and ataxia around the time that lithium was initiated.  Will continue Wellbutrin SR. Chronic depressive disorder - Plan: pramipexole (MIRAPEX) 0.5 MG tablet, buPROPion (WELLBUTRIN SR) 150 MG 12 hr tablet  Please see After Visit Summary for patient specific instructions.  Future Appointments  Date Time Provider [place]  02/25/2018  3:30 PM [facility] None  03/04/2018  3:30 PM McGowan, Larene Beach A, PA-C [facility] None  04/11/2018  4:00 PM Thayer Headings, PMHNP [facility] None  05/30/2018  2:00 PM Lada, Satira Anis, MD [place] [facility]    No orders of the defined types were placed in this encounter.      -------------------------------

## 2018-02-11 ENCOUNTER — Encounter: Payer: Self-pay | Admitting: Emergency Medicine

## 2018-02-19 ENCOUNTER — Ambulatory Visit: Payer: Medicare HMO | Admitting: Psychiatry

## 2018-02-20 ENCOUNTER — Other Ambulatory Visit: Payer: Self-pay | Admitting: Family Medicine

## 2018-02-20 DIAGNOSIS — N138 Other obstructive and reflux uropathy: Secondary | ICD-10-CM

## 2018-02-20 DIAGNOSIS — N401 Enlarged prostate with lower urinary tract symptoms: Principal | ICD-10-CM

## 2018-02-22 ENCOUNTER — Ambulatory Visit: Payer: Medicare HMO

## 2018-02-25 ENCOUNTER — Other Ambulatory Visit: Payer: Medicare HMO

## 2018-02-25 DIAGNOSIS — N401 Enlarged prostate with lower urinary tract symptoms: Secondary | ICD-10-CM | POA: Diagnosis not present

## 2018-02-25 DIAGNOSIS — N138 Other obstructive and reflux uropathy: Secondary | ICD-10-CM | POA: Diagnosis not present

## 2018-02-26 LAB — PSA: Prostate Specific Ag, Serum: 0.2 ng/mL (ref 0.0–4.0)

## 2018-02-28 DIAGNOSIS — R69 Illness, unspecified: Secondary | ICD-10-CM | POA: Diagnosis not present

## 2018-03-04 ENCOUNTER — Ambulatory Visit: Payer: Medicare HMO | Admitting: Urology

## 2018-03-04 ENCOUNTER — Encounter: Payer: Self-pay | Admitting: Urology

## 2018-03-04 VITALS — BP 132/80 | Ht 68.0 in | Wt 250.2 lb

## 2018-03-04 DIAGNOSIS — N401 Enlarged prostate with lower urinary tract symptoms: Secondary | ICD-10-CM | POA: Diagnosis not present

## 2018-03-04 DIAGNOSIS — N138 Other obstructive and reflux uropathy: Secondary | ICD-10-CM

## 2018-03-04 DIAGNOSIS — N529 Male erectile dysfunction, unspecified: Secondary | ICD-10-CM | POA: Diagnosis not present

## 2018-03-04 MED ORDER — SILDENAFIL CITRATE 20 MG PO TABS: ORAL_TABLET | ORAL | 3 refills | Status: DC

## 2018-03-04 NOTE — Progress Notes (Signed)
3:41 PM   Duane Hughes [birth year] [id number]  Referring provider: Arnetha Courser, MD [address] [place], [place] [address]  Chief Complaint  Patient presents with  . Establish Care    HPI: Patient is a 67 year old Caucasian male who presents today for 12 month follow-up for erectile dysfunction and BPH with lower urinary tract symptoms.    Erectile dysfunction His SHIM score is 24,  which is no erectile dysfunction.   His previous SHIM score was 18.  His libido is preserved.  His risk factors for ED are age, BPH, DM, HTN, HLD, sleep apnea, depression, antidepressants, pain medication and blood pressure medications. He denies any painful erections or curvatures with his erections.   He has tried PDE5-inhibitors in the past with success and continues them now.   Cool Name 03/04/18 1524         SHIM: Over the last 6 months:   How do you rate your confidence that you could get and keep an erection?  High     When you had erections with sexual stimulation, how often were your erections hard enough for penetration (entering your partner)?  Almost Always or Always     During sexual intercourse, how often were you able to maintain your erection after you had penetrated (entered) your partner?  Almost Always or Always     During sexual intercourse, how difficult was it to maintain your erection to completion of intercourse?  Not Difficult     When you attempted sexual intercourse, how often was it satisfactory for you?  Almost Always or Always       SHIM Total Score   SHIM  24        Score: 1-7 Severe ED 8-11 Moderate ED 12-16 Mild-Moderate ED 17-21 Mild ED 22-25 No ED  BPH WITH LUTS His IPSS score today is 12, which is moderate lower urinary tract symptomatology. He is mostly satisfied with his quality life due to his urinary symptoms.  His previous IPSS score was 4/1.  His previous PVR is 15 mL.    His urinary complaints consist of mild urinary  leakage. He denies any dysuria, hematuria or suprapubic pain.  He currently taking tamsulosin 0.4 mg.  He also denies any recent fevers, chills, nausea or vomiting.  He does not have a family history of PCa.  He is having balance issue at this time and is wondering if his tamsulosin is contributing factor.  He is not having dizziness. IPSS    Row Name 03/04/18 1500         International Prostate Symptom Score   How often have you had the sensation of not emptying your bladder?  More than half the time     How often have you had to urinate less than every two hours?  Less than half the time     How often have you found you stopped and started again several times when you urinated?  Less than 1 in 5 times     How often have you found it difficult to postpone urination?  Less than 1 in 5 times     How often have you had a weak urinary stream?  About half the time     How often have you had to strain to start urination?  Not at All     How many times did you typically get up at night to urinate?  1 Time  Total IPSS Score  12       Quality of Life due to urinary symptoms   If you were to spend the rest of your life with your urinary condition just the way it is now how would you feel about that?  Mostly Satisfied        Score:  1-7 Mild 8-19 Moderate 20-35 Severe   PMH: Past Medical History:  Diagnosis Date  . Anxiety   . Arthritis    back, hands, knees  . Colon polyp 05/09/2016   TUBULAR ADENOMAS   . Depression   . Diabetes mellitus without complication ([place])    type 2  . Ectatic abdominal aorta ([place]) 04/18/2016   2.9 cm on [place] 2016, unchanged 2017  . History of skin cancer    hand  . Hypertension   . Morbid obesity ([place]) 11/11/2014  . Obesity 11/11/2014  . Sleep apnea    cpap    Surgical History: Past Surgical History:  Procedure Laterality Date  . COLONOSCOPY WITH PROPOFOL N/A 05/09/2016   Procedure: COLONOSCOPY WITH PROPOFOL;  Surgeon: Lucilla Lame, MD;  Location:  [facility] ENDOSCOPY;  Service: Endoscopy;  Laterality: N/A;  . HERNIA REPAIR    . JOINT REPLACEMENT     04-04-17 Dr. Gladstone Lighter   Right knee  . KNEE ARTHROSCOPY     several scopes done  . KNEE CLOSED REDUCTION Right 05/31/2017   Procedure: CLOSED MANIPULATION RIGHT KNEE;  Surgeon: Latanya Maudlin, MD;  Location: [facility] OR[facility];  Service: Orthopedics;  Laterality: Right;  Femoral Block  . MEDIAL PARTIAL KNEE REPLACEMENT Left    2000s?  Marland Kitchen SKIN CANCER EXCISION  Feb 2017   removed from right hand  . TOTAL KNEE ARTHROPLASTY Right 04/04/2017   Procedure: RIGHT TOTAL KNEE ARTHROPLASTY;  Surgeon: Latanya Maudlin, MD;  Location: [facility];  Service: Orthopedics;  Laterality: Right;    Home Medications:  Allergies as of 03/04/2018   No Known Allergies     Medication List        Accurate as of 03/04/18  3:41 PM. Always use your most recent med list.          aspirin EC 81 MG tablet Take 81 mg by mouth daily.   atorvastatin 10 MG tablet Commonly known as:  LIPITOR Take 1 tablet (10 mg total) by mouth at bedtime.   buPROPion 150 MG 12 hr tablet Commonly known as:  WELLBUTRIN SR TAKE 150 MG BY MOUTH TWICE A DAY   diphenhydramine-acetaminophen 25-500 MG Tabs tablet Commonly known as:  TYLENOL PM Take 2 tablets by mouth at bedtime as needed (sleep).   EQL VITAMIN D3 2000 units Caps Generic drug:  Cholecalciferol Take 2,000 Units by mouth daily.   lamoTRIgine 200 MG tablet Commonly known as:  LAMICTAL Take 200 mg by mouth daily.   lisinopril 5 MG tablet Commonly known as:  PRINIVIL,ZESTRIL TAKE 0.5 TABLETS (2.5 MG TOTAL) BY MOUTH DAILY.   lithium carbonate 150 MG capsule Take 150 mg by mouth at bedtime.   magnesium oxide 400 MG tablet Commonly known as:  MAG-OX Take 400 mg by mouth daily.   MELATONIN GUMMIES PO Take by mouth at bedtime as needed.   metFORMIN 500 MG tablet Commonly known as:  GLUCOPHAGE TAKE 1 TABLET BY MOUTH 2 TIMES DAILY WITH A MEAL.   MULTIVITAMIN ADULT PO Take 1  tablet by mouth daily.   naproxen 500 MG tablet Commonly known as:  NAPROSYN Take 500 mg by mouth daily.   pramipexole  0.5 MG tablet Commonly known as:  MIRAPEX Take 1 tablet (0.5 mg total) by mouth 2 (two) times daily.   sildenafil 20 MG tablet Commonly known as:  REVATIO Take 3 to 5 tablets two hours before intercouse on an empty stomach.  Do not take with nitrates.   Turmeric 500 MG Caps Take 500 mg by mouth daily.       Allergies: No Known Allergies  Family History: Family History  Problem Relation Age of Onset  . Alcohol abuse Mother   . Rheum arthritis Mother   . Stroke Maternal Grandmother   . Stroke Paternal Grandfather   . Dementia Paternal Grandmother   . Cancer Neg Hx   . Diabetes Neg Hx   . Heart disease Neg Hx   . Hypertension Neg Hx   . Kidney disease Neg Hx   . Prostate cancer Neg Hx     Social History:  reports that he has never smoked. He has never used smokeless tobacco. He reports that he does not drink alcohol or use drugs.  ROS: UROLOGY Frequent Urination?: No Hard to postpone urination?: No Burning/pain with urination?: No Get up at night to urinate?: No Leakage of urine?: No Urine stream starts and stops?: No Trouble starting stream?: No Do you have to strain to urinate?: No Blood in urine?: No Urinary tract infection?: No Sexually transmitted disease?: No Injury to kidneys or bladder?: No Painful intercourse?: No Weak stream?: No Erection problems?: No Penile pain?: No  Gastrointestinal Nausea?: No Indigestion/heartburn?: No Diarrhea?: No Constipation?: No  Constitutional Fever: No Night sweats?: No Weight loss?: No Fatigue?: No  Skin Skin rash/lesions?: No Itching?: No  Eyes Blurred vision?: No Double vision?: No  Ears/Nose/Throat Sore throat?: No Sinus problems?: No  Hematologic/Lymphatic Swollen glands?: No Easy bruising?: No  Cardiovascular Leg swelling?: No Chest pain?: No  Respiratory Cough?:  No Shortness of breath?: No  Endocrine Excessive thirst?: No  Musculoskeletal Back pain?: No Joint pain?: No  Neurological Headaches?: No Dizziness?: No  Psychologic Depression?: No Anxiety?: No  Physical Exam: BP 132/80 (BP Location: Left Arm, Patient Position: Sitting, Cuff Size: Large)   Ht 5\' 8"  (1.727 m)   Wt 250 lb 3.2 oz (113.5 kg)   BMI 38.04 kg/m   Constitutional: Well nourished. Alert and oriented, No acute distress. HEENT: [place] AT, moist mucus membranes. Trachea midline, no masses. Cardiovascular: No clubbing, cyanosis, or edema. Respiratory: Normal respiratory effort, no increased work of breathing. GI: Abdomen is soft, non tender, non distended, no abdominal masses. Liver and spleen not palpable.  No hernias appreciated.  Stool sample for occult testing is not indicated.   GU: No CVA tenderness.  No bladder fullness or masses.  Patient with circumcised phallus.  Urethral meatus is patent.  No penile discharge. No penile lesions or rashes. Scrotum without lesions, cysts, rashes and/or edema.  Testicles are located scrotally bilaterally. No masses are appreciated in the testicles. Left and right epididymis are normal. Rectal: Patient with  normal sphincter tone. Anus and perineum without scarring or rashes. No rectal masses are appreciated. Prostate is approximately 60 + grams,could only palpate the apex and midportion of gland, no nodules are appreciated. . Skin: No rashes, bruises or suspicious lesions. Lymph: No cervical or inguinal adenopathy. Neurologic: Grossly intact, no focal deficits, moving all 4 extremities. Psychiatric: Normal mood and affect.  Laboratory Data: Lab Results  Component Value Date   WBC 7.2 11/27/2017   HGB 15.4 11/27/2017   HCT 45.4 11/27/2017  MCV 94.4 11/27/2017   PLT 271 11/27/2017    Lab Results  Component Value Date   CREATININE 1.07 11/27/2017    PSA History  0.8 ng/mL on 11/12/2014  0.8 ng/mL on 03/19/2015  1.0 ng/mL  on 02/28/2016  0.2 ng/mL on 01/2017   0.2 ng/mL on 02/25/2018 I have reviewed the labs.    Lab Results  Component Value Date   HGBA1C 6.1 (H) 11/27/2017    Lab Results  Component Value Date   TSH 1.18 05/03/2017    Lab Results  Component Value Date   AST 22 11/27/2017   Lab Results  Component Value Date   ALT 27 11/27/2017   I have reviewed the labs.  Assessment & Plan:    1. Erectile dysfunction SHIM score is 24, it is improving having success with PDE5i; refill given  RTC in 12 months for repeat SHIM score and exam   2. BPH with LUTS IPSS score is 12/2, it is worse Continue conservative management, avoiding bladder irritants and timed voiding's Discussed cysto to evaluate for BOO; patient defers at this time  RTC in 12 months for IPSS, PSA and exam   Return in about 1 year (around 03/05/2019) for IPSS, PSA and exam.  These notes generated with voice recognition software. I apologize for typographical errors.  Zara Council, PA-C  [facility] [address] [place], [place] [address] [phone]

## 2018-03-11 ENCOUNTER — Telehealth: Payer: Self-pay | Admitting: Family Medicine

## 2018-03-11 NOTE — Telephone Encounter (Signed)
Copied from [place] [id number]. Topic: Quick Communication - See Telephone Encounter >> Mar 11, 2018  5:12 PM Rutherford Nail, [place] wrote: CRM for notification. See Telephone encounter for: 03/11/18. Janett Billow with [employer] and was checking the status of the fax being received regarding CPAP supplies. Please advise. Would like a call back once this has been received.  CB#: [phone]

## 2018-03-12 NOTE — Telephone Encounter (Signed)
Called back notified he would need to get through specialist, I believe he either see's ENT or Pulmonologist

## 2018-03-22 DIAGNOSIS — G4733 Obstructive sleep apnea (adult) (pediatric): Secondary | ICD-10-CM | POA: Diagnosis not present

## 2018-03-25 ENCOUNTER — Ambulatory Visit: Payer: Medicare HMO | Admitting: Psychiatry

## 2018-04-09 ENCOUNTER — Other Ambulatory Visit: Payer: Self-pay

## 2018-04-09 ENCOUNTER — Telehealth: Payer: Self-pay | Admitting: Psychiatry

## 2018-04-09 DIAGNOSIS — F32A Depression, unspecified: Secondary | ICD-10-CM

## 2018-04-09 DIAGNOSIS — F329 Major depressive disorder, single episode, unspecified: Secondary | ICD-10-CM

## 2018-04-09 MED ORDER — PRAMIPEXOLE DIHYDROCHLORIDE 0.5 MG PO TABS: 0.5000 mg | ORAL_TABLET | Freq: Two times a day (BID) | ORAL | 1 refills | Status: DC

## 2018-04-09 MED ORDER — LAMOTRIGINE 200 MG PO TABS: 200.0000 mg | ORAL_TABLET | Freq: Every day | ORAL | 1 refills | Status: DC

## 2018-04-09 NOTE — Telephone Encounter (Signed)
Patient had to cancel his upcoming appt. So rescheduled to 05/13/18.  He needs refillls on his pramipexole 0.5mg  and lamictal to get to appt.  Send to [facility] in [place].

## 2018-04-09 NOTE — Telephone Encounter (Signed)
Refills sent to pharmacy. 

## 2018-04-11 ENCOUNTER — Ambulatory Visit: Payer: Medicare HMO | Admitting: Psychiatry

## 2018-04-11 DIAGNOSIS — D485 Neoplasm of uncertain behavior of skin: Secondary | ICD-10-CM | POA: Diagnosis not present

## 2018-04-11 DIAGNOSIS — D1800 Hemangioma unspecified site: Secondary | ICD-10-CM | POA: Diagnosis not present

## 2018-04-11 DIAGNOSIS — D225 Melanocytic nevi of trunk: Secondary | ICD-10-CM | POA: Diagnosis not present

## 2018-04-11 DIAGNOSIS — L570 Actinic keratosis: Secondary | ICD-10-CM | POA: Diagnosis not present

## 2018-04-11 DIAGNOSIS — D2260 Melanocytic nevi of unspecified upper limb, including shoulder: Secondary | ICD-10-CM | POA: Diagnosis not present

## 2018-04-11 DIAGNOSIS — D0461 Carcinoma in situ of skin of right upper limb, including shoulder: Secondary | ICD-10-CM | POA: Diagnosis not present

## 2018-04-11 DIAGNOSIS — L812 Freckles: Secondary | ICD-10-CM | POA: Diagnosis not present

## 2018-04-11 DIAGNOSIS — Z1283 Encounter for screening for malignant neoplasm of skin: Secondary | ICD-10-CM | POA: Diagnosis not present

## 2018-04-11 DIAGNOSIS — L578 Other skin changes due to chronic exposure to nonionizing radiation: Secondary | ICD-10-CM | POA: Diagnosis not present

## 2018-04-11 DIAGNOSIS — L821 Other seborrheic keratosis: Secondary | ICD-10-CM | POA: Diagnosis not present

## 2018-04-11 DIAGNOSIS — Z85828 Personal history of other malignant neoplasm of skin: Secondary | ICD-10-CM | POA: Diagnosis not present

## 2018-04-12 ENCOUNTER — Telehealth: Payer: Self-pay | Admitting: Family Medicine

## 2018-04-12 NOTE — Telephone Encounter (Signed)
Copied from [place] [id number]. Topic: General - Other >> Apr 12, 2018 12:59 PM Keene Breath wrote: Reason for CRM: Patient called to request that his wife become a new patient of Dr. Sanda Klein.  Please call patient back if this is a possibility  CB# [phone]

## 2018-04-12 NOTE — Telephone Encounter (Signed)
appt made for Feb. 2020

## 2018-04-18 ENCOUNTER — Telehealth: Payer: Self-pay | Admitting: Family Medicine

## 2018-04-18 ENCOUNTER — Ambulatory Visit (INDEPENDENT_AMBULATORY_CARE_PROVIDER_SITE_OTHER): Payer: Medicare HMO

## 2018-04-18 VITALS — BP 104/64 | HR 94 | Temp 98.2°F | Ht 68.0 in | Wt 257.9 lb

## 2018-04-18 DIAGNOSIS — Z0000 Encounter for general adult medical examination without abnormal findings: Secondary | ICD-10-CM

## 2018-04-18 NOTE — Telephone Encounter (Signed)
Pt seen in office today for annual medicare wellness visit. Pt c/o pain in knees and pain in muscle around hip. He takes 1-2 oxycodone tablets per month and only has 2 left. Pt requesting rx for oxycodone for pain. Oxycodone-apap 5-325mg  last prescribed 06/01/17. Advised pt he will need appt but I would notify Dr. Sanda Klein of his request. Pt scheduled for follow up next month.

## 2018-04-18 NOTE — Progress Notes (Signed)
Subjective:   Duane Hughes is a 67 y.o. male who presents for Medicare Annual/Subsequent preventive examination.  Review of Systems:   Cardiac Risk Factors include: advanced age (>67men, >38 women);diabetes mellitus;male gender;hypertension;dyslipidemia;obesity (BMI >30kg/m2)     Objective:    Vitals: BP 104/64 (BP Location: Left Arm, Patient Position: Sitting, Cuff Size: Large)   Pulse 94   Temp 98.2 F (36.8 C) (Oral)   Ht 5\' 8"  (1.727 m)   Wt 257 lb 14.4 oz (117 kg)   BMI 39.21 kg/m   Body mass index is 39.21 kg/m.  Advanced Directives 04/18/2018 05/31/2017 05/31/2017 05/29/2017 04/04/2017 04/04/2017 03/29/2017  Does Patient Have a Medical Advance Directive? Yes Yes Yes Yes Yes Yes Yes  Type of Paramedic of [place];Living will Living will;Healthcare Power of Attorney Living will;Healthcare Power of Attorney Living will;Healthcare Power of [place];Living will [place];Living will [place];Living will  Does patient want to make changes to medical advance directive? - No - Patient declined No - Patient declined - No - Patient declined - No - Patient declined  Copy of [place] in Chart? Yes - validated most recent copy scanned in chart (See row information) Yes Yes Yes No - copy requested No - copy requested No - copy requested  Would patient like information on creating a medical advance directive? - - - - - - -    Tobacco Social History   Tobacco Use  Smoking Status Never Smoker  Smokeless Tobacco Never Used     Counseling given: Not Answered   Clinical Intake:  Pre-visit preparation completed: Yes  Pain : 0-10 Pain Score: 3  Pain Type: Chronic pain Pain Location: Knee Pain Orientation: Right, Left Pain Descriptors / Indicators: Aching Pain Onset: More than a month ago Pain Frequency: Constant Pain Relieving Factors: naproxen 500mg  daily  Pain  Relieving Factors: naproxen 500mg  daily  Nutritional Status: BMI > 30  Obese Diabetes: Yes CBG done?: No Did pt. bring in CBG monitor from home?: No   Nutrition Risk Assessment:  Has the patient had any N/V/D within the last 2 months?  No  Does the patient have any non-healing wounds?  No  Has the patient had any unintentional weight loss or weight gain?  No   Diabetes:  Is the patient diabetic?  Yes  If diabetic, was a CBG obtained today?  No  Did the patient bring in their glucometer from home?  No  How often do you monitor your CBG's? Pt does not check blood sugar.   Financial Strains and Diabetes Management:  Are you having any financial strains with the device, your supplies or your medication? No .  Does the patient want to be seen by Chronic Care Management for management of their diabetes?  No  Would the patient like to be referred to a Nutritionist or for Diabetic Management?  No   Diabetic Exams:  Diabetic Eye Exam: Completed 11/26/17 negative retinopathy.   Diabetic Foot Exam: Completed 11/27/17.   How often do you need to have someone help you when you read instructions, pamphlets, or other written materials from your doctor or pharmacy?: 1 - Never What is the last grade level you completed in school?: associate's degree  Interpreter Needed?: No  Information entered by :: Clemetine Marker LPN  Past Medical History:  Diagnosis Date  . Anxiety   . Arthritis    back, hands, knees  . Colon  polyp 05/09/2016   TUBULAR ADENOMAS   . Depression   . Diabetes mellitus without complication ([place])    type 2  . Ectatic abdominal aorta ([place]) 04/18/2016   2.9 cm on [place] 2016, unchanged 2017  . History of skin cancer    hand  . Hyperlipidemia   . Hypertension   . Morbid obesity ([place]) 11/11/2014  . Obesity 11/11/2014  . Sleep apnea    cpap   Past Surgical History:  Procedure Laterality Date  . COLONOSCOPY WITH PROPOFOL N/A 05/09/2016   Procedure: COLONOSCOPY WITH PROPOFOL;   Surgeon: Lucilla Lame, MD;  Location: [facility] ENDOSCOPY;  Service: Endoscopy;  Laterality: N/A;  . HERNIA REPAIR    . JOINT REPLACEMENT     04-04-17 Dr. Gladstone Lighter   Right knee  . KNEE ARTHROSCOPY     several scopes done  . KNEE CLOSED REDUCTION Right 05/31/2017   Procedure: CLOSED MANIPULATION RIGHT KNEE;  Surgeon: Latanya Maudlin, MD;  Location: [facility];  Service: Orthopedics;  Laterality: Right;  Femoral Block  . MEDIAL PARTIAL KNEE REPLACEMENT Left    2000s?  Marland Kitchen SKIN CANCER EXCISION  Feb 2017   removed from right hand  . 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 Alcohol abuse Mother   . Rheum arthritis Mother   . Stroke Maternal Grandmother   . Stroke Paternal Grandfather   . Dementia Paternal Grandmother   . Cancer Neg Hx   . Diabetes Neg Hx   . Heart disease Neg Hx   . Hypertension Neg Hx   . Kidney disease Neg Hx   . Prostate cancer Neg Hx    Social History   Socioeconomic History  . Marital status: Married    Spouse name: Not on file  . Number of children: 6  . Years of education: Not on file  . Highest education level: Associate degree: academic program  Occupational History    Comment: part time  Social Needs  . Financial resource strain: Not hard at all  . Food insecurity:    Worry: Never true    Inability: Never true  . Transportation needs:    Medical: No    Non-medical: No  Tobacco Use  . Smoking status: Never Smoker  . Smokeless tobacco: Never Used  Substance and Sexual Activity  . Alcohol use: No    Alcohol/week: 0.0 standard drinks  . Drug use: No  . Sexual activity: Yes    Partners: Female  Lifestyle  . Physical activity:    Days per week: 3 days    Minutes per session: 60 min  . Stress: Not at all  Relationships  . Social connections:    Talks on phone: More than  three times a week    Gets together: Three times a week    Attends religious service: More than 4 times per year    Active member of club or organization: Yes    Attends meetings of clubs or organizations: 1 to 4 times per year    Relationship status: Married  Other Topics Concern  . Not on file  Social History Narrative  . Not on file    Outpatient Encounter Medications as of 04/18/2018  Medication Sig  . aspirin EC 81 MG tablet Take 81 mg by mouth daily.  Marland Kitchen atorvastatin (LIPITOR) 10 MG  tablet Take 1 tablet (10 mg total) by mouth at bedtime.  Marland Kitchen buPROPion (WELLBUTRIN SR) 150 MG 12 hr tablet TAKE 150 MG BY MOUTH TWICE A DAY  . Cholecalciferol (EQL VITAMIN D3) 2000 units CAPS Take 2,000 Units by mouth daily.   . diphenhydramine-acetaminophen (TYLENOL PM) 25-500 MG TABS tablet Take 2 tablets by mouth at bedtime as needed (sleep).  Marland Kitchen lamoTRIgine (LAMICTAL) 200 MG tablet Take 1 tablet (200 mg total) by mouth daily.  Marland Kitchen lisinopril (PRINIVIL,ZESTRIL) 5 MG tablet TAKE 0.5 TABLETS (2.5 MG TOTAL) BY MOUTH DAILY.  . metFORMIN (GLUCOPHAGE) 500 MG tablet TAKE 1 TABLET BY MOUTH 2 TIMES DAILY WITH A MEAL.  . Multiple Vitamins-Minerals (MULTIVITAMIN ADULT PO) Take 1 tablet by mouth daily.  . naproxen (NAPROSYN) 500 MG tablet Take 500 mg by mouth daily.  . pramipexole (MIRAPEX) 0.5 MG tablet Take 1 tablet (0.5 mg total) by mouth 2 (two) times daily.  . sildenafil (REVATIO) 20 MG tablet Take 3 to 5 tablets two hours before intercouse on an empty stomach.  Do not take with nitrates.  . Turmeric 500 MG CAPS Take 500 mg by mouth daily.   Marland Kitchen lithium carbonate 150 MG capsule Take 150 mg by mouth at bedtime.   . magnesium oxide (MAG-OX) 400 MG tablet Take 400 mg by mouth daily.  Marland Kitchen MELATONIN GUMMIES PO Take by mouth at bedtime as needed.   No facility-administered encounter medications on file as of 04/18/2018.     Activities of Daily Living In your present state of health, do you have any difficulty  performing the following activities: 04/18/2018 11/27/2017  Hearing? N Y  Comment wears hearing aids -  Vision? N N  Comment wears glasses -  Difficulty concentrating or making decisions? N N  Walking or climbing stairs? N N  Dressing or bathing? N N  Doing errands, shopping? N N  Preparing Food and eating ? N -  Using the Toilet? N -  In the past six months, have you accidently leaked urine? N -  Do you have problems with loss of bowel control? N -  Managing your Medications? N -  Managing your Finances? N -  Housekeeping or managing your Housekeeping? N -  Some recent data might be hidden    Patient Care Team: Lada, Satira Anis, MD as PCP - General (Family Medicine) Ralene Bathe, MD (Dermatology) Laneta Simmers as Physician Assistant (Urology) Bary Castilla, Forest Gleason, MD (General Surgery) Latanya Maudlin, MD as Consulting Physician (Orthopedic Surgery)   Assessment:   This is a routine wellness examination for Duane Hughes.  Exercise Activities and Dietary recommendations Current Exercise Habits: Home exercise routine, Type of exercise: Other - see comments(golf), Time (Minutes): 60, Frequency (Times/Week): 3, Weekly Exercise (Minutes/Week): 180, Intensity: Mild, Exercise limited by: orthopedic condition(s)  Goals    . Weight (lb) < 250 lb (113.4 kg)     Pt would like to lose 10 lbs over the next year       Fall Risk Fall Risk  04/18/2018 11/27/2017 05/03/2017 03/08/2017 02/20/2017  Falls in the past year? 0 No No No Yes  Comment - - - - -  Number falls in past yr: 0 - - - 1  Injury with Fall? - - - - No  Follow up - - - - Education provided;Falls prevention discussed   FALL RISK PREVENTION PERTAINING TO THE HOME:  Any stairs in or around the home WITH handrails? Yes  Home free of loose throw rugs  in walkways, pet beds, electrical cords, etc? Yes  Adequate lighting in your home to reduce risk of falls? Yes   ASSISTIVE DEVICES UTILIZED TO PREVENT FALLS:  Life  alert? No  Use of a cane, walker or w/c? No  Grab bars in the bathroom? No  Shower chair or bench in shower? No  Elevated toilet seat or a handicapped toilet? No   DME ORDERS:  DME order needed?  No   TIMED UP AND GO:  Was the test performed? Yes .  Length of time to ambulate 10 feet: 6 sec.   GAIT:  Appearance of gait: Gait stead-fast and without the use of an assistive device.  Education: Fall risk prevention has been discussed.  Intervention(s) required? No   Depression Screen PHQ 2/9 Scores 04/18/2018 11/27/2017 05/03/2017 03/08/2017  PHQ - 2 Score 0 3 0 4  PHQ- 9 Score 0 6 - 7    Cognitive Function     6CIT Screen 04/18/2018  What Year? 0 points  What month? 0 points  What time? 0 points  Count back from 20 0 points  Months in reverse 0 points  Repeat phrase 0 points  Total Score 0    Immunization History  Administered Date(s) Administered  . Influenza, High Dose Seasonal PF 02/24/2016  . Influenza,inj,Quad PF,6+ Mos 03/18/2015, 02/28/2018  . Pneumococcal Conjugate-13 12/17/2014  . Pneumococcal Polysaccharide-23 04/05/2016  . Zoster 05/01/2009    Qualifies for Shingles Vaccine? Yes  Zostavax completed 2011. Due for Shingrix. Education has been provided regarding the importance of this vaccine. Pt has been advised to call insurance company to determine out of pocket expense. Advised may also receive vaccine at local pharmacy or Health Dept. Verbalized acceptance and understanding.  Tdap: Up to date per patient. Received at [facility] Pt to get records.  Flu Vaccine: Up to date   Pneumococcal Vaccine: Up to date   Screening Tests Health Maintenance  Topic Date Due  . HEMOGLOBIN A1C  05/30/2018  . OPHTHALMOLOGY EXAM  11/27/2018  . FOOT EXAM  11/28/2018  . COLONOSCOPY  05/09/2021  . TETANUS/TDAP  03/01/2028  . INFLUENZA VACCINE  Completed  . Hepatitis C Screening  Completed  . PNA vac Low Risk Adult  Completed   Cancer Screenings:  Colorectal  Screening: Completed 05/09/16. Repeat every 5 years;   Lung Cancer Screening: (Low Dose CT Chest recommended if Age 93-80 years, 30 pack-year currently smoking OR have quit w/in 15years.) does not qualify.   Additional Screening:  Hepatitis C Screening: does qualify; Completed 09/19/16  Vision Screening: Recommended annual ophthalmology exams for early detection of glaucoma and other disorders of the eye. Is the patient up to date with their annual eye exam?  Yes  Who is the provider or what is the name of the office in which the pt attends annual eye exams? Dr. Joya [facility] Dental Screening: Recommended annual dental exams for proper oral hygiene  Community Resource Referral:  CRR required this visit?  No       Plan:    I have personally reviewed and addressed the Medicare Annual Wellness questionnaire and have noted the following in the patient's chart:  A. Medical and social history B. Use of alcohol, tobacco or illicit drugs  C. Current medications and supplements D. Functional ability and status E.  Nutritional status F.  Physical activity G. Advance directives H. List of other physicians I.  Hospitalizations, surgeries, and ER visits in previous 12 months J.  Vitals K. Screenings such  as hearing and vision if needed, cognitive and depression L. Referrals and appointments   In addition, I have reviewed and discussed with patient certain preventive protocols, quality metrics, and best practice recommendations. A written personalized care plan for preventive services as well as general preventive health recommendations were provided to patient.   Signed,  Clemetine Marker, LPN Nurse Health Advisor   Nurse Notes: pt c/o pain in knees and muscle in right hip area. Refill request encounter sent for oxycodone. Pt scheduled for follow up next month.

## 2018-04-18 NOTE — Patient Instructions (Signed)
Duane Hughes , Thank you for taking time to come for your Medicare Wellness Visit. I appreciate your ongoing commitment to your health goals. Please review the following plan we discussed and let me know if I can assist you in the future.   Screening recommendations/referrals: Colonoscopy: done 1/9/418 repeat in 2023 Recommended yearly ophthalmology/optometry visit for glaucoma screening and checkup Recommended yearly dental visit for hygiene and checkup  Vaccinations: Influenza vaccine: done 02/28/18 Pneumococcal vaccine: done 04/05/16 Tdap vaccine: done at [facility]. Please have them fax [place] a record of this vaccine.  Shingles vaccine: Shingrix discussed. Please contact your pharmacy for coverage information.    Conditions/risks identified: Recommend increasing physical activity.   Next appointment: Please follow up in one year for your Medicare Annual Wellness visit.    Preventive Care 66 Years and Older, Male Preventive care refers to lifestyle choices and visits with your health care provider that can promote health and wellness. What does preventive care include?  A yearly physical exam. This is also called an annual well check.  Dental exams once or twice a year.  Routine eye exams. Ask your health care provider how often you should have your eyes checked.  Personal lifestyle choices, including:  Daily care of your teeth and gums.  Regular physical activity.  Eating a healthy diet.  Avoiding tobacco and drug use.  Limiting alcohol use.  Practicing safe sex.  Taking low doses of aspirin every day.  Taking vitamin and mineral supplements as recommended by your health care provider. What happens during an annual well check? The services and screenings done by your health care provider during your annual well check will depend on your age, overall health, lifestyle risk factors, and family history of disease. Counseling  Your health care provider may ask you questions  about your:  Alcohol use.  Tobacco use.  Drug use.  Emotional well-being.  Home and relationship well-being.  Sexual activity.  Eating habits.  History of falls.  Memory and ability to understand (cognition).  Work and work Statistician. Screening  You may have the following tests or measurements:  Height, weight, and BMI.  Blood pressure.  Lipid and cholesterol levels. These may be checked every 5 years, or more frequently if you are over 15 years old.  Skin check.  Lung cancer screening. You may have this screening every year starting at age 78 if you have a 30-pack-year history of smoking and currently smoke or have quit within the past 15 years.  Fecal occult blood test (FOBT) of the stool. You may have this test every year starting at age 37.  Flexible sigmoidoscopy or colonoscopy. You may have a sigmoidoscopy every 5 years or a colonoscopy every 10 years starting at age 53.  Prostate cancer screening. Recommendations will vary depending on your family history and other risks.  Hepatitis C blood test.  Hepatitis B blood test.  Sexually transmitted disease (STD) testing.  Diabetes screening. This is done by checking your blood sugar (glucose) after you have not eaten for a while (fasting). You may have this done every 1-3 years.  Abdominal aortic aneurysm (AAA) screening. You may need this if you are a current or former smoker.  Osteoporosis. You may be screened starting at age 92 if you are at high risk. Talk with your health care provider about your test results, treatment options, and if necessary, the need for more tests. Vaccines  Your health care provider may recommend certain vaccines, such as:  Influenza vaccine. This  is recommended every year.  Tetanus, diphtheria, and acellular pertussis (Tdap, Td) vaccine. You may need a Td booster every 10 years.  Zoster vaccine. You may need this after age 23.  Pneumococcal 13-valent conjugate (PCV13)  vaccine. One dose is recommended after age 79.  Pneumococcal polysaccharide (PPSV23) vaccine. One dose is recommended after age 48. Talk to your health care provider about which screenings and vaccines you need and how often you need them. This information is not intended to replace advice given to you by your health care provider. Make sure you discuss any questions you have with your health care provider. Document Released: 05/14/2015 Document Revised: 01/05/2016 Document Reviewed: 02/16/2015 Elsevier Interactive Patient Education  2017 [place] Prevention in the Home Falls can cause injuries. They can happen to people of all ages. There are many things you can do to make your home safe and to help prevent falls. What can I do on the outside of my home?  Regularly fix the edges of walkways and driveways and fix any cracks.  Remove anything that might make you trip as you walk through a door, such as a raised step or threshold.  Trim any bushes or trees on the path to your home.  Use bright outdoor lighting.  Clear any walking paths of anything that might make someone trip, such as rocks or tools.  Regularly check to see if handrails are loose or broken. Make sure that both sides of any steps have handrails.  Any raised decks and porches should have guardrails on the edges.  Have any leaves, snow, or ice cleared regularly.  Use sand or salt on walking paths during winter.  Clean up any spills in your garage right away. This includes oil or grease spills. What can I do in the bathroom?  Use night lights.  Install grab bars by the toilet and in the tub and shower. Do not use towel bars as grab bars.  Use non-skid mats or decals in the tub or shower.  If you need to sit down in the shower, use a plastic, non-slip stool.  Keep the floor dry. Clean up any water that spills on the floor as soon as it happens.  Remove soap buildup in the tub or shower  regularly.  Attach bath mats securely with double-sided non-slip rug tape.  Do not have throw rugs and other things on the floor that can make you trip. What can I do in the bedroom?  Use night lights.  Make sure that you have a light by your bed that is easy to reach.  Do not use any sheets or blankets that are too big for your bed. They should not hang down onto the floor.  Have a firm chair that has side arms. You can use this for support while you get dressed.  Do not have throw rugs and other things on the floor that can make you trip. What can I do in the kitchen?  Clean up any spills right away.  Avoid walking on wet floors.  Keep items that you use a lot in easy-to-reach places.  If you need to reach something above you, use a strong step stool that has a grab bar.  Keep electrical cords out of the way.  Do not use floor polish or wax that makes floors slippery. If you must use wax, use non-skid floor wax.  Do not have throw rugs and other things on the floor that can make you  trip. What can I do with my stairs?  Do not leave any items on the stairs.  Make sure that there are handrails on both sides of the stairs and use them. Fix handrails that are broken or loose. Make sure that handrails are as long as the stairways.  Check any carpeting to make sure that it is firmly attached to the stairs. Fix any carpet that is loose or worn.  Avoid having throw rugs at the top or bottom of the stairs. If you do have throw rugs, attach them to the floor with carpet tape.  Make sure that you have a light switch at the top of the stairs and the bottom of the stairs. If you do not have them, ask someone to add them for you. What else can I do to help prevent falls?  Wear shoes that:  Do not have high heels.  Have rubber bottoms.  Are comfortable and fit you well.  Are closed at the toe. Do not wear sandals.  If you use a stepladder:  Make sure that it is fully  opened. Do not climb a closed stepladder.  Make sure that both sides of the stepladder are locked into place.  Ask someone to hold it for you, if possible.  Clearly mark and make sure that you can see:  Any grab bars or handrails.  First and last steps.  Where the edge of each step is.  Use tools that help you move around (mobility aids) if they are needed. These include:  Canes.  Walkers.  Scooters.  Crutches.  Turn on the lights when you go into a dark area. Replace any light bulbs as soon as they burn out.  Set up your furniture so you have a clear path. Avoid moving your furniture around.  If any of your floors are uneven, fix them.  If there are any pets around you, be aware of where they are.  Review your medicines with your doctor. Some medicines can make you feel dizzy. This can increase your chance of falling. Ask your doctor what other things that you can do to help prevent falls. This information is not intended to replace advice given to you by your health care provider. Make sure you discuss any questions you have with your health care provider. Document Released: 02/11/2009 Document Revised: 09/23/2015 Document Reviewed: 05/22/2014 Elsevier Interactive Patient Education  2017 [employer].

## 2018-04-18 NOTE — Telephone Encounter (Signed)
I'm so very sorry, but he'll need to be seen for this medicine OR he can contact his orthopaedist It's been more than three months since his last visit with me

## 2018-04-19 NOTE — Telephone Encounter (Signed)
Pt notified already has appt in jan.

## 2018-04-30 ENCOUNTER — Ambulatory Visit: Payer: Medicare HMO | Admitting: Psychiatry

## 2018-05-13 ENCOUNTER — Encounter: Payer: Self-pay | Admitting: Psychiatry

## 2018-05-13 ENCOUNTER — Ambulatory Visit: Payer: Medicare HMO | Admitting: Psychiatry

## 2018-05-13 DIAGNOSIS — R69 Illness, unspecified: Secondary | ICD-10-CM | POA: Diagnosis not present

## 2018-05-13 DIAGNOSIS — F329 Major depressive disorder, single episode, unspecified: Secondary | ICD-10-CM | POA: Diagnosis not present

## 2018-05-13 DIAGNOSIS — F32A Depression, unspecified: Secondary | ICD-10-CM

## 2018-05-13 MED ORDER — BUPROPION HCL ER (SR) 150 MG PO TB12: 150.0000 mg | ORAL_TABLET | Freq: Two times a day (BID) | ORAL | 2 refills | Status: DC

## 2018-05-13 MED ORDER — PRAMIPEXOLE DIHYDROCHLORIDE 0.5 MG PO TABS: 0.5000 mg | ORAL_TABLET | Freq: Two times a day (BID) | ORAL | 2 refills | Status: DC

## 2018-05-13 MED ORDER — LAMOTRIGINE 200 MG PO TABS: 200.0000 mg | ORAL_TABLET | Freq: Every day | ORAL | 2 refills | Status: DC

## 2018-05-13 NOTE — Progress Notes (Signed)
Duane Hughes [id number] 24-Jun-1950 68 y.o.  Subjective:   Patient ID:  Duane Hughes is a 68 y.o. (DOB [birth year]) male.  Chief Complaint:  Chief Complaint  Patient presents with  . Follow-up    h/o Depression    HPI Duane Hughes presents to the office today for follow-up of depression and anxiety. He reports that he is doing well overall. He reports that he had a "good holiday and didn't have the down that I sometimes do after." Denies any recent depression. He reports "I'm working on things more, and I think I am keeping occupied more" and that this has helped his mood. Reports that he has been working towards some goals. Reports some occasional irritability, "but not bad." reports that he is not as easily frustrated in certain situations, such as playing golf or driving in traffic. Sleeping well. He reports that he intentionally lost weight and then gained 10 lbs over the holidays. He reports that his energy and motivation have improved and he is not sitting as much. Reports that he has been getting out of the house more and working on some projects around the house. Denies impaired concentration. Denies SI .  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   Going on a trip in late January/early February and is looking forward to this.   Past Psychiatric Medication Trials: Pristiq-ineffective Effexor-effective for 2 years then no longer effective Bupropion-taken for years Cymbalta-helped for 2 years but no longer effective Abilify-not effective at 5 mg and increased emotional lability at 10 mg Lamictal Lithium Pramipexole- effective for mood signs and symptoms   Review of Systems:  Review of Systems  Musculoskeletal: Negative for gait problem.  Neurological:       Reports that tremor has significantly decreased. Reports that he has decreased amount of Monster energy drinks.   Psychiatric/Behavioral:       Please refer to HPI    Medications: I have reviewed the patient's current medications.  Current Outpatient Medications  Medication Sig Dispense Refill  . aspirin EC 81 MG tablet Take 81 mg by mouth daily.    Marland Kitchen atorvastatin (LIPITOR) 10 MG tablet Take 1 tablet (10 mg total) by mouth at bedtime. 90 tablet 3  . buPROPion (WELLBUTRIN SR) 150 MG 12 hr tablet Take 1 tablet (150 mg total) by mouth 2 (two) times daily. TAKE 150 MG BY MOUTH TWICE A DAY 180 tablet 2  . Cholecalciferol (EQL VITAMIN D3) 2000 units CAPS Take 2,000 Units by mouth daily.     . diphenhydramine-acetaminophen (TYLENOL PM) 25-500 MG TABS tablet Take 2 tablets by mouth at bedtime as needed (sleep).    Marland Kitchen lamoTRIgine (LAMICTAL) 200 MG tablet Take 1 tablet (200 mg total) by mouth daily. 90 tablet 2  . metFORMIN (GLUCOPHAGE) 500 MG tablet TAKE 1 TABLET BY MOUTH 2 TIMES DAILY WITH A MEAL. 60 tablet 5  . Multiple Vitamins-Minerals (MULTIVITAMIN ADULT PO) Take 1 tablet by mouth daily.    . naproxen (NAPROSYN) 500 MG tablet Take 500 mg by mouth daily.    . pramipexole (MIRAPEX) 0.5 MG tablet Take 1 tablet (0.5 mg total) by mouth 2 (two) times daily. 180 tablet 2  . Turmeric 500 MG CAPS Take 500 mg by mouth daily.     Marland Kitchen lisinopril (PRINIVIL,ZESTRIL) 5 MG tablet TAKE 0.5 TABLETS (2.5 MG TOTAL) BY MOUTH DAILY. 45 tablet 1  .  magnesium oxide (MAG-OX) 400 MG tablet Take 400 mg by mouth daily.    Marland Kitchen MELATONIN GUMMIES PO Take by mouth at bedtime as needed.    . sildenafil (REVATIO) 20 MG tablet Take 3 to 5 tablets two hours before intercouse on an empty stomach.  Do not take with nitrates. 50 tablet 3   No current facility-administered medications for this visit.     Medication Side Effects: None  Allergies: No Known Allergies  Past Medical History:  Diagnosis Date  . Anxiety   . Arthritis    back, hands, knees  . Colon polyp 05/09/2016   TUBULAR ADENOMAS   . Depression   . Diabetes mellitus  without complication ([place])    type 2  . Ectatic abdominal aorta ([place]) 04/18/2016   2.9 cm on [place] 2016, unchanged 2017  . History of skin cancer    hand  . Hyperlipidemia   . Hypertension   . Morbid obesity ([place]) 11/11/2014  . Obesity 11/11/2014  . Sleep apnea    cpap    Family History  Problem Relation Age of Onset  . Alcohol abuse Mother   . Rheum arthritis Mother   . Stroke Maternal Grandmother   . Stroke Paternal Grandfather   . Dementia Paternal Grandmother   . Cancer Neg Hx   . Diabetes Neg Hx   . Heart disease Neg Hx   . Hypertension Neg Hx   . Kidney disease Neg Hx   . Prostate cancer Neg Hx     Social History   Socioeconomic History  . Marital status: Married    Spouse name: Not on file  . Number of children: 6  . Years of education: Not on file  . Highest education level: Associate degree: academic program  Occupational History    Comment: part time  Social Needs  . Financial resource strain: Not hard at all  . Food insecurity:    Worry: Never true    Inability: Never true  . Transportation needs:    Medical: No    Non-medical: No  Tobacco Use  . Smoking status: Never Smoker  . Smokeless tobacco: Never Used  Substance and Sexual Activity  . Alcohol use: No    Alcohol/week: 0.0 standard drinks  . Drug use: No  . Sexual activity: Yes    Partners: Female  Lifestyle  . Physical activity:    Days per week: 3 days    Minutes per session: 60 min  . Stress: Not at all  Relationships  . Social connections:    Talks on phone: More than three times a week    Gets together: Three times a week    Attends religious service: More than 4 times per year    Active member of club or organization: Yes    Attends meetings of clubs or organizations: 1 to 4 times per year    Relationship status: Married  . Intimate partner violence:    Fear of current or ex partner: No    Emotionally abused: No    Physically abused: No    Forced sexual activity: No  Other  Topics Concern  . Not on file  Social History Narrative  . Not on file    Past Medical History, Surgical history, Social history, and Family history were reviewed and updated as appropriate.   Please see review of systems for further details on the patient's review from today.   Objective:   Physical Exam:  There were no vitals taken  for this visit.  Physical Exam Constitutional:      General: He is not in acute distress.    Appearance: He is well-developed.  Musculoskeletal:        General: No deformity.  Neurological:     Mental Status: He is alert and oriented to person, place, and time.     Coordination: Coordination normal.  Psychiatric:        Mood and Affect: Mood is not anxious or depressed. Affect is not labile, blunt, angry or inappropriate.        Speech: Speech normal.        Behavior: Behavior normal.        Thought Content: Thought content normal. Thought content does not include homicidal or suicidal ideation. Thought content does not include homicidal or suicidal plan.        Judgment: Judgment normal.     Comments: Insight intact. No auditory or visual hallucinations. No delusions.      Lab Review:     Component Value Date/Time   NA 139 11/27/2017 1419   NA 139 05/12/2015 1537   K 4.5 11/27/2017 1419   CL 105 11/27/2017 1419   CO2 25 11/27/2017 1419   GLUCOSE 105 11/27/2017 1419   BUN 19 11/27/2017 1419   BUN 15 05/12/2015 1537   CREATININE 1.07 11/27/2017 1419   CALCIUM 9.0 11/27/2017 1419   PROT 6.2 11/27/2017 1419   PROT 6.2 03/19/2015 0825   ALBUMIN 4.1 09/19/2016 1400   ALBUMIN 4.0 03/19/2015 0825   AST 22 11/27/2017 1419   ALT 27 11/27/2017 1419   ALKPHOS 46 09/19/2016 1400   BILITOT 0.4 11/27/2017 1419   BILITOT 0.5 03/19/2015 0825   GFRNONAA 71 11/27/2017 1419   GFRAA 82 11/27/2017 1419       Component Value Date/Time   WBC 7.2 11/27/2017 1419   RBC 4.81 11/27/2017 1419   HGB 15.4 11/27/2017 1419   HGB 14.8 06/20/2016 1458    HCT 45.4 11/27/2017 1419   HCT 43.6 06/20/2016 1458   PLT 271 11/27/2017 1419   PLT 260 06/20/2016 1458   MCV 94.4 11/27/2017 1419   MCV 93 06/20/2016 1458   MCH 32.0 11/27/2017 1419   MCHC 33.9 11/27/2017 1419   RDW 12.0 11/27/2017 1419   RDW 13.3 06/20/2016 1458   LYMPHSABS 1,318 11/27/2017 1419   LYMPHSABS 2.0 06/20/2016 1458   MONOABS 0.4 03/29/2017 1343   EOSABS 396 11/27/2017 1419   EOSABS 0.4 06/20/2016 1458   BASOSABS 58 11/27/2017 1419   BASOSABS 0.1 06/20/2016 1458    No results found for: POCLITH, LITHIUM   No results found for: PHENYTOIN, PHENOBARB, VALPROATE, CBMZ   .res Assessment: Plan:   Continue Wellbutrin SR 150 mg twice daily for depression Continue Lamictal 200 mg daily for depression. Continue pramipexole 0.5 mg twice daily for depression. Chronic depressive disorder - Plan: buPROPion (WELLBUTRIN SR) 150 MG 12 hr tablet, lamoTRIgine (LAMICTAL) 200 MG tablet, pramipexole (MIRAPEX) 0.5 MG tablet  Please see After Visit Summary for patient specific instructions.  Future Appointments  Date Time Provider [place]  05/30/2018  2:00 PM Arnetha Courser, MD [facility]  11/04/2018  2:45 PM Thayer Headings, PMHNP [facility] None  03/03/2019  3:30 PM [facility] None  03/06/2019  3:45 PM McGowan, Larene Beach A, PA-C [facility] None  04/22/2019  2:10 PM [place] ADVISOR [facility]    No orders of the defined types were placed in this encounter.     -------------------------------

## 2018-05-14 DIAGNOSIS — D0461 Carcinoma in situ of skin of right upper limb, including shoulder: Secondary | ICD-10-CM | POA: Diagnosis not present

## 2018-05-30 ENCOUNTER — Ambulatory Visit (INDEPENDENT_AMBULATORY_CARE_PROVIDER_SITE_OTHER): Payer: Medicare HMO | Admitting: Family Medicine

## 2018-05-30 ENCOUNTER — Encounter: Payer: Self-pay | Admitting: Family Medicine

## 2018-05-30 VITALS — BP 126/72 | HR 95 | Temp 98.7°F | Ht 68.0 in | Wt 261.7 lb

## 2018-05-30 DIAGNOSIS — I10 Essential (primary) hypertension: Secondary | ICD-10-CM | POA: Diagnosis not present

## 2018-05-30 DIAGNOSIS — G4733 Obstructive sleep apnea (adult) (pediatric): Secondary | ICD-10-CM

## 2018-05-30 DIAGNOSIS — N400 Enlarged prostate without lower urinary tract symptoms: Secondary | ICD-10-CM | POA: Diagnosis not present

## 2018-05-30 DIAGNOSIS — E1169 Type 2 diabetes mellitus with other specified complication: Secondary | ICD-10-CM | POA: Diagnosis not present

## 2018-05-30 DIAGNOSIS — E785 Hyperlipidemia, unspecified: Secondary | ICD-10-CM | POA: Diagnosis not present

## 2018-05-30 DIAGNOSIS — I77811 Abdominal aortic ectasia: Secondary | ICD-10-CM

## 2018-05-30 DIAGNOSIS — Z5181 Encounter for therapeutic drug level monitoring: Secondary | ICD-10-CM

## 2018-05-30 DIAGNOSIS — E669 Obesity, unspecified: Secondary | ICD-10-CM | POA: Diagnosis not present

## 2018-05-30 DIAGNOSIS — R69 Illness, unspecified: Secondary | ICD-10-CM | POA: Diagnosis not present

## 2018-05-30 DIAGNOSIS — F331 Major depressive disorder, recurrent, moderate: Secondary | ICD-10-CM

## 2018-05-30 DIAGNOSIS — E781 Pure hyperglyceridemia: Secondary | ICD-10-CM

## 2018-05-30 MED ORDER — OXYCODONE-ACETAMINOPHEN 5-325 MG PO TABS: 1.0000 | ORAL_TABLET | Freq: Four times a day (QID) | ORAL | 0 refills | Status: DC | PRN

## 2018-05-30 NOTE — Patient Instructions (Addendum)
Try to limit saturated fats in your diet (bologna, hot dogs, barbeque, cheeseburgers, hamburgers, steak, bacon, sausage, cheese, etc.) and get more fresh fruits, vegetables, and whole grains  Check out the information at familydoctor.org entitled "Nutrition for Weight Loss: What You Need to Know about Fad Diets" Try to lose between 1-2 pounds per week by taking in fewer calories and burning off more calories You can succeed by limiting portions, limiting foods dense in calories and fat, becoming more active, and drinking 8 glasses of water a day (64 ounces) Don't skip meals, especially breakfast, as skipping meals may alter your metabolism Do not use over-the-counter weight loss pills or gimmicks that claim rapid weight loss A healthy BMI (or body mass index) is between 18.5 and 24.9 You can calculate your ideal BMI at the [place] website ClubMonetize.fr   Obesity, Adult Obesity is the condition of having too much total body fat. Being overweight or obese means that your weight is greater than what is considered healthy for your body size. Obesity is determined by a measurement called BMI. BMI is an estimate of body fat and is calculated from height and weight. For adults, a BMI of 30 or higher is considered obese. Obesity can eventually lead to other health concerns and major illnesses, including:  Stroke.  Coronary artery disease (CAD).  Type 2 diabetes.  Some types of cancer, including cancers of the colon, breast, uterus, and gallbladder.  Osteoarthritis.  High blood pressure (hypertension).  High cholesterol.  Sleep apnea.  Gallbladder stones.  Infertility problems. What are the causes? The main cause of obesity is taking in (consuming) more calories than your body uses for energy. Other factors that contribute to this condition may include:  Being born with genes that make you more likely to become obese.  Having a medical  condition that causes obesity. These conditions include: ? Hypothyroidism. ? Polycystic ovarian syndrome (PCOS). ? Binge-eating disorder. ? Cushing syndrome.  Taking certain medicines, such as steroids, antidepressants, and seizure medicines.  Not being physically active (sedentary lifestyle).  Living where there are limited places to exercise safely or buy healthy foods.  Not getting enough sleep. What increases the risk? The following factors may increase your risk of this condition:  Having a family history of obesity.  Being a woman of African-American descent.  Being a man of Hispanic descent. What are the signs or symptoms? Having excessive body fat is the main symptom of this condition. How is this diagnosed? This condition may be diagnosed based on:  Your symptoms.  Your medical history.  A physical exam. Your health care provider may measure: ? Your BMI. If you are an adult with a BMI between 25 and less than 30, you are considered overweight. If you are an adult with a BMI of 30 or higher, you are considered obese. ? The distances around your hips and your waist (circumferences). These may be compared to each other to help diagnose your condition. ? Your skinfold thickness. Your health care provider may gently pinch a fold of your skin and measure it. How is this treated? Treatment for this condition often includes changing your lifestyle. Treatment may include some or all of the following:  Dietary changes. Work with your health care provider and a dietitian to set a weight-loss goal that is healthy and reasonable for you. Dietary changes may include eating: ? Smaller portions. A portion size is the amount of a particular food that is healthy for you to eat at one  varies from person to person. ? Low-calorie or low-fat options. ? More whole grains, fruits, and vegetables.  Regular physical activity. This may include aerobic activity (cardio) and strength  training.  Medicine to help you lose weight. Your health care provider may prescribe medicine if you are unable to lose 1 pound a week after 6 weeks of eating more healthily and doing more physical activity.  Surgery. Surgical options may include gastric banding and gastric bypass. Surgery may be done if: ? Other treatments have not helped to improve your condition. ? You have a BMI of 40 or higher. ? You have life-threatening health problems related to obesity. Follow these instructions at home:  Eating and drinking   Follow recommendations from your health care provider about what you eat and drink. Your health care provider may advise you to: ? Limit fast foods, sweets, and processed snack foods. ? Choose low-fat options, such as low-fat milk instead of whole milk. ? Eat 5 or more servings of fruits or vegetables every day. ? Eat at home more often. This gives you more control over what you eat. ? Choose healthy foods when you eat out. ? Learn what a healthy portion size is. ? Keep low-fat snacks on hand. ? Avoid sugary drinks, such as soda, fruit juice, iced tea sweetened with sugar, and flavored milk. ? Eat a healthy breakfast.  Drink enough water to keep your urine clear or pale yellow.  Do not go without eating for long periods of time (do not fast) or follow a fad diet. Fasting and fad diets can be unhealthy and even dangerous. Physical Activity  Exercise regularly, as told by your health care provider. Ask your health care provider what types of exercise are safe for you and how often you should exercise.  Warm up and stretch before being active.  Cool down and stretch after being active.  Rest between periods of activity. Lifestyle  Limit the time that you spend in front of your TV, computer, or video game system.  Find ways to reward yourself that do not involve food.  Limit alcohol intake to no more than 1 drink a day for nonpregnant women and 2 drinks a day for  men. One drink equals 12 oz of beer, 5 oz of wine, or 1 oz of hard liquor. General instructions  Keep a weight loss journal to keep track of the food you eat and how much you exercise you get.  Take over-the-counter and prescription medicines only as told by your health care provider.  Take vitamins and supplements only as told by your health care provider.  Consider joining a support group. Your health care provider may be able to recommend a support group.  Keep all follow-up visits as told by your health care provider. This is important. Contact a health care provider if:  You are unable to meet your weight loss goal after 6 weeks of dietary and lifestyle changes. This information is not intended to replace advice given to you by your health care provider. Make sure you discuss any questions you have with your health care provider. Document Released: 05/25/2004 Document Revised: 09/20/2015 Document Reviewed: 02/03/2015 Elsevier Interactive Patient Education  2019 Elsevier Inc.  Preventing Unhealthy Weight Gain, Adult Staying at a healthy weight is important to your overall health. When fat builds up in your body, you may become overweight or obese. Being overweight or obese increases your risk of developing certain health problems, such as heart disease, diabetes, sleeping problems,   joint problems, and some types of cancer. Unhealthy weight gain is often the result of making unhealthy food choices or not getting enough exercise. You can make changes to your lifestyle to prevent obesity and stay as healthy as possible. What nutrition changes can be made?   Eat only as much as your body needs. To do this: ? Pay attention to signs that you are hungry or full. Stop eating as soon as you feel full. ? If you feel hungry, try drinking water first before eating. Drink enough water so your urine is clear or pale yellow. ? Eat smaller portions. Pay attention to portion sizes when eating  out. ? Look at serving sizes on food labels. Most foods contain more than one serving per container. ? Eat the recommended number of calories for your gender and activity level. For most active people, a daily total of 2,000 calories is appropriate. If you are trying to lose weight or are not very active, you may need to eat fewer calories. Talk with your health care provider or a diet and nutrition specialist (dietitian) about how many calories you need each day.  Choose healthy foods, such as: ? Fruits and vegetables. At each meal, try to fill at least half of your plate with fruits and vegetables. ? Whole grains, such as whole-wheat bread, brown rice, and quinoa. ? Lean meats, such as chicken or fish. ? Other healthy proteins, such as beans, eggs, or tofu. ? Healthy fats, such as nuts, seeds, fatty fish, and olive oil. ? Low-fat or fat-free dairy products.  Check food labels, and avoid food and drinks that: ? Are high in calories. ? Have added sugar. ? Are high in sodium. ? Have saturated fats or trans fats.  Cook foods in healthier ways, such as by baking, broiling, or grilling.  Make a meal plan for the week, and shop with a grocery list to help you stay on track with your purchases. Try to avoid going to the grocery store when you are hungry.  When grocery shopping, try to shop around the outside of the store first, where the fresh foods are. Doing this helps you to avoid prepackaged foods, which can be high in sugar, salt (sodium), and fat. What lifestyle changes can be made?   Exercise for 30 or more minutes on 5 or more days each week. Exercising may include brisk walking, yard work, biking, running, swimming, and team sports like basketball and soccer. Ask your health care provider which exercises are safe for you.  Do muscle-strengthening activities, such as lifting weights or using resistance bands, on 2 or more days a week.  Do not use any products that contain nicotine or  tobacco, such as cigarettes and e-cigarettes. If you need help quitting, ask your health care provider.  Limit alcohol intake to no more than 1 drink a day for nonpregnant women and 2 drinks a day for men. One drink equals 12 oz of beer, 5 oz of wine, or 1 oz of hard liquor.  Try to get 7-9 hours of sleep each night. What other changes can be made?  Keep a food and activity journal to keep track of: ? What you ate and how many calories you had. Remember to count the calories in sauces, dressings, and side dishes. ? Whether you were active, and what exercises you did. ? Your calorie, weight, and activity goals.  Check your weight regularly. Track any changes. If you notice you have gained weight, make changes   make changes to your diet or activity routine.  Avoid taking weight-loss medicines or supplements. Talk to your health care provider before starting any new medicine or supplement.  Talk to your health care provider before trying any new diet or exercise plan. Why are these changes important? Eating healthy, staying active, and having healthy habits can help you to prevent obesity. Those changes also:  Help you manage stress and emotions.  Help you connect with friends and family.  Improve your self-esteem.  Improve your sleep.  Prevent long-term health problems. What can happen if changes are not made? Being obese or overweight can cause you to develop joint or bone problems, which can make it hard for you to stay active or do activities you enjoy. Being obese or overweight also puts stress on your heart and lungs and can lead to health problems like diabetes, heart disease, and some cancers. Where to find more information Talk with your health care provider or a dietitian about healthy eating and healthy lifestyle choices. You may also find information from:  U.S. Department of Agriculture, MyPlate: FormerBoss.no  American Heart Association: www.heart.org  Centers for Disease  Control and Prevention: http://www.wolf.info/ Summary  Staying at a healthy weight is important to your overall health. It helps you to prevent certain diseases and health problems, such as heart disease, diabetes, joint problems, sleep disorders, and some types of cancer.  Being obese or overweight can cause you to develop joint or bone problems, which can make it hard for you to stay active or do activities you enjoy.  You can prevent unhealthy weight gain by eating a healthy diet, exercising regularly, not smoking, limiting alcohol, and getting enough sleep.  Talk with your health care provider or a dietitian for guidance about healthy eating and healthy lifestyle choices. This information is not intended to replace advice given to you by your health care provider. Make sure you discuss any questions you have with your health care provider. Document Released: 04/18/2016 Document Revised: 01/26/2017 Document Reviewed: 05/24/2016 Elsevier Interactive Patient Education  2019 [employer].  Return in 3 months for pain medicine refill if needed

## 2018-05-30 NOTE — Assessment & Plan Note (Signed)
Well-controlled, limit salt

## 2018-05-30 NOTE — Assessment & Plan Note (Signed)
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

## 2018-05-30 NOTE — Progress Notes (Signed)
BP 126/72   Pulse 95   Temp 98.7 F (37.1 C)   Ht 5\' 8"  (1.727 m)   Wt 261 lb 11.2 oz (118.7 kg)   SpO2 93%   BMI 39.79 kg/m    Subjective:    Patient ID: Duane Hughes, male    DOB: [birth year], 68 y.o.   MRN: [id number]  HPI: Duane Hughes is a 68 y.o. male  Chief Complaint  Patient presents with  . Follow-up    HPI Here for f/u  Arthritis in the left knee, s/p partial knee arthroplasty; s/p total on the right He has used oxycodone for severe pain, only takes it when really bad; has only used thirty pills in one year He does not drink at all He has 500 mg naproxen, but he only takes it in the morning and not BID Has the nerve issue in the leeft knee; something pinched and had severe pain; to the foot and up to the butt, happened once, gone away, pinched (?)  He got down to 249 pounds; one son weighed 50 pounds and the other son weighed 150 pounds and patient hit 250 pounds way back when; he was so happy to get under the 250 mark; holidays came around; weight back up; gets weighed twice a week at plasma donation Typical holiday stuff, "so much stuff"; to lose weight, he does best cutting out everything [facility] [employer]; golfing 2-3 x a week, cart He does not eat much at home; eats out mostly in the evening; salads at [employer]; regular salad dressing; we looked at nutrition content on line  Type 2 diabetes No new problems with feet; using cream for tinea No dry mouth, no blurred vision Things have been really good Last eye exam July 2019; no problems  Lab Results  Component Value Date   HGBA1C 6.1 (H) 11/27/2017    Urologist is monitoring prostate; last PSA 0.2 No hx of prostate cancer  Mood is doing well; seeing psychiatrist  High cholesterol; working on weight loss; doing better with sweets (except for the holidays); righ tnow, no donuts, no candy  Lab Results  Component Value Date   CHOL 137 11/27/2017   HDL 37 (L) 11/27/2017   LDLCALC 63  11/27/2017   TRIG 279 (H) 11/27/2017   CHOLHDL 3.7 11/27/2017   OSA; using machine "always"  Depression screen [facility] 2/9 05/30/2018 04/18/2018 11/27/2017 05/03/2017 03/08/2017  Decreased Interest 0 0 - 0 2  Down, Depressed, Hopeless 0 0 3 0 2  PHQ - 2 Score 0 0 3 0 4  Altered sleeping 0 0 1 - 0  Tired, decreased energy 0 0 1 - 3  Change in appetite 0 0 0 - 0  Feeling bad or failure about yourself  0 0 1 - 0  Trouble concentrating 0 0 0 - -  Moving slowly or fidgety/restless 0 0 0 - 0  Suicidal thoughts 0 0 0 - 0  PHQ-9 Score 0 0 6 - 7  Difficult doing work/chores Not difficult at all Not difficult at all Not difficult at all - Not difficult at all  Some recent data might be hidden   Fall Risk  05/30/2018 04/18/2018 11/27/2017 05/03/2017 03/08/2017  Falls in the past year? 0 0 No No No  Comment - - - - -  Number falls in past yr: - 0 - - -  Injury with Fall? - - - - -  Follow up - - - - -  Relevant past medical, surgical, family and social history reviewed Past Medical History:  Diagnosis Date  . Anxiety   . Arthritis    back, hands, knees  . Colon polyp 05/09/2016   TUBULAR ADENOMAS   . Depression   . Diabetes mellitus without complication ([place])    type 2  . Ectatic abdominal aorta ([place]) 04/18/2016   2.9 cm on [place] 2016, unchanged 2017  . History of skin cancer    hand  . Hyperlipidemia   . Hypertension   . Morbid obesity ([place]) 11/11/2014  . Obesity 11/11/2014  . Sleep apnea    cpap   Past Surgical History:  Procedure Laterality Date  . COLONOSCOPY WITH PROPOFOL N/A 05/09/2016   Procedure: COLONOSCOPY WITH PROPOFOL;  Surgeon: Lucilla Lame, MD;  Location: [facility] ENDOSCOPY;  Service: Endoscopy;  Laterality: N/A;  . HERNIA REPAIR    . JOINT REPLACEMENT     04-04-17 Dr. Gladstone Lighter   Right knee  . KNEE ARTHROSCOPY     several scopes done  . KNEE CLOSED REDUCTION Right 05/31/2017   Procedure: CLOSED MANIPULATION RIGHT KNEE;  Surgeon: Latanya Maudlin, MD;  Location: [facility];  Service:  Orthopedics;  Laterality: Right;  Femoral Block  . MEDIAL PARTIAL KNEE REPLACEMENT Left    2000s?  Marland Kitchen SKIN CANCER EXCISION  Feb 2017   removed from right hand  . 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 Alcohol abuse Mother   . Rheum arthritis Mother   . Stroke Maternal Grandmother   . Stroke Paternal Grandfather   . Dementia Paternal Grandmother   . Cancer Neg Hx   . Diabetes Neg Hx   . Heart disease Neg Hx   . Hypertension Neg Hx   . Kidney disease Neg Hx   . Prostate cancer Neg Hx    Social History   Tobacco Use  . Smoking status: Never Smoker  . Smokeless tobacco: Never Used  Substance Use Topics  . Alcohol use: No    Alcohol/week: 0.0 standard drinks  . Drug use: No     [facility]  AUDIT-C Score  0      Interim medical history since last visit reviewed. Allergies and medications reviewed  Review of Systems Per HPI unless specifically indicated above     Objective:    BP 126/72   Pulse 95   Temp 98.7 F (37.1 C)   Ht 5\' 8"  (1.727 m)   Wt 261 lb 11.2 oz (118.7 kg)   SpO2 93%   BMI 39.79 kg/m   Wt Readings from Last 3 Encounters:  05/30/18 261 lb 11.2 oz (118.7 kg)  04/18/18 257 lb 14.4 oz (117 kg)  03/04/18 250 lb 3.2 oz (113.5 kg)    Physical Exam Constitutional:      General: He is not in acute distress.    Appearance: He is well-developed. He is obese.  HENT:     Head: Normocephalic and atraumatic.  Eyes:     General: No scleral icterus. Neck:     Thyroid: No thyromegaly.  Cardiovascular:     Rate and Rhythm: Normal rate and regular rhythm.  Pulmonary:     Effort: Pulmonary effort is normal.     Breath sounds: Normal breath sounds.  Abdominal:  General: Bowel sounds are normal. There is no  distension.     Palpations: Abdomen is soft.  Musculoskeletal:     Left knee: He exhibits deformity. He exhibits no swelling.     Comments: Crepitus with active flexion/ext of the LEFT knee  Skin:    General: Skin is warm and dry.     Coloration: Skin is not pale.  Neurological:     Mental Status: He is alert.     Coordination: Coordination normal.  Psychiatric:        Mood and Affect: Mood normal. Mood is not anxious or depressed.        Behavior: Behavior normal.        Thought Content: Thought content normal.        Judgment: Judgment normal.    Diabetic Foot Form - Detailed   Diabetic Foot Exam - detailed Diabetic Foot exam was performed with the following findings:  Yes 05/30/2018  3:03 PM  Visual Foot Exam completed.:  Yes  Pulse Foot Exam completed.:  Yes  Right Dorsalis Pedis:  Present Left Dorsalis Pedis:  Present  Sensory Foot Exam Completed.:  Yes Semmes-Weinstein Monofilament Test R Site 1-Great Toe:  Pos L Site 1-Great Toe:  Pos        Results for orders placed or performed in visit on 02/25/18  PSA  Result Value Ref Range   Prostate Specific Ag, Serum 0.2 0.0 - 4.0 ng/mL      Assessment & Plan:   Problem List Items Addressed This Visit      Cardiovascular and Mediastinum   Ectatic abdominal aorta ([place]) (Chronic)    Due for [place] in Dec 2022; weight loss and lipid management      Benign hypertension (Chronic)    Well-controlled, limit salt        Respiratory   Obstructive sleep apnea    Using CPAP nightly        Endocrine   Diabetes mellitus type 2 in obese ([place]) - Primary    Foot exam by MD; check A1c today, urine microalb:Cr normal within the last year; eye exam UTD; encouraged healthy eating, walking, etc.      Relevant Orders   Lipid panel   Hemoglobin A1c     Other   Morbid obesity ([place])    Working on this; politely declined another nutrition referral; encouragement given      Medication monitoring encounter    Check renal and  hepatic function      Relevant Orders   COMPLETE METABOLIC PANEL WITH GFR   Major depressive disorder, recurrent ([place])    Doing well, continue meds, seeing psych      Hypertriglyceridemia    Fatty fish once a week or fish oil capsules BID      Relevant Orders   Lipid panel   Enlarged prostate    Managed by urologist      Dyslipidemia    LDL is at goal; continue statin          Follow up plan: Return in about 6 months (around 11/28/2018) for follow-up visit with Dr. Sanda Klein; Medicare Wellness visits here with [place] [facility].  An after-visit summary was printed and given to the patient at [facility].  Please see the patient instructions which may contain other information and recommendations beyond what is mentioned above in the assessment and plan.  Meds ordered this encounter  Medications  . oxyCODONE-acetaminophen (PERCOCET/ROXICET) 5-325 MG tablet    Sig: Take 1 tablet by mouth  every 6 (six) hours as needed for severe pain.    Dispense:  20 tablet    Refill:  0    PMP Aware reviewed, chronic pain; no red flags, I am now managing his pain    Orders Placed This Encounter  Procedures  . Lipid panel  . Hemoglobin A1c  . COMPLETE METABOLIC PANEL WITH GFR

## 2018-05-30 NOTE — Assessment & Plan Note (Signed)
Fatty fish once a week or fish oil capsules BID

## 2018-05-30 NOTE — Assessment & Plan Note (Signed)
LDL is at goal; continue statin

## 2018-05-30 NOTE — Assessment & Plan Note (Signed)
Doing well, continue meds, seeing psych

## 2018-05-30 NOTE — Assessment & Plan Note (Signed)
Foot exam by MD; check A1c today, urine microalb:Cr normal within the last year; eye exam UTD; encouraged healthy eating, walking, etc.

## 2018-05-30 NOTE — Assessment & Plan Note (Signed)
Due for [place] in Dec 2022; weight loss and lipid management

## 2018-05-30 NOTE — Assessment & Plan Note (Signed)
Managed by urologist 

## 2018-05-30 NOTE — Assessment & Plan Note (Signed)
Check renal and hepatic function

## 2018-05-30 NOTE — Assessment & Plan Note (Signed)
Using CPAP nightly 

## 2018-05-31 LAB — COMPLETE METABOLIC PANEL WITH GFR
AG Ratio: 1.7 (calc) (ref 1.0–2.5)
ALT: 35 U/L (ref 9–46)
AST: 44 U/L — ABNORMAL HIGH (ref 10–35)
Albumin: 3.7 g/dL (ref 3.6–5.1)
Alkaline phosphatase (APISO): 47 U/L (ref 40–115)
BUN: 17 mg/dL (ref 7–25)
CO2: 24 mmol/L (ref 20–32)
Calcium: 9 mg/dL (ref 8.6–10.3)
Chloride: 108 mmol/L (ref 98–110)
Creat: 0.94 mg/dL (ref 0.70–1.25)
GFR, EST AFRICAN AMERICAN: 96 mL/min/{1.73_m2} (ref >=60)
GFR, Est Non African American: 83 mL/min/{1.73_m2} (ref >=60)
Globulin: 2.2 g/dL (calc) (ref 1.9–3.7)
Glucose, Bld: 112 mg/dL (ref 65–139)
Potassium: 4 mmol/L (ref 3.5–5.3)
Sodium: 143 mmol/L (ref 135–146)
TOTAL PROTEIN: 5.9 g/dL — AB (ref 6.1–8.1)
Total Bilirubin: 0.4 mg/dL (ref 0.2–1.2)

## 2018-05-31 LAB — LIPID PANEL
CHOL/HDL RATIO: 3.6 (calc) (ref <5.0)
Cholesterol: 132 mg/dL (ref <200)
HDL: 37 mg/dL — ABNORMAL LOW (ref >40)
LDL CHOLESTEROL (CALC): 71 mg/dL
Non-HDL Cholesterol (Calc): 95 mg/dL (calc) (ref <130)
TRIGLYCERIDES: 159 mg/dL — AB (ref <150)

## 2018-05-31 LAB — HEMOGLOBIN A1C
Hgb A1c MFr Bld: 6.2 % of total Hgb — ABNORMAL HIGH (ref <5.7)
Mean Plasma Glucose: 131 (calc)
eAG (mmol/L): 7.3 (calc)

## 2018-06-04 ENCOUNTER — Other Ambulatory Visit: Payer: Self-pay | Admitting: Family Medicine

## 2018-06-04 DIAGNOSIS — R748 Abnormal levels of other serum enzymes: Secondary | ICD-10-CM

## 2018-06-04 NOTE — Progress Notes (Signed)
Order liver US, mildly elev lft but may be fatty liver

## 2018-06-11 ENCOUNTER — Other Ambulatory Visit: Payer: Self-pay | Admitting: Family Medicine

## 2018-06-11 ENCOUNTER — Ambulatory Visit
Admission: RE | Admit: 2018-06-11 | Discharge: 2018-06-11 | Disposition: A | Discharge status: Home / Self Care | Payer: Medicare HMO | Source: Ambulatory Visit | Attending: Family Medicine | Admitting: Family Medicine

## 2018-06-11 ENCOUNTER — Encounter: Payer: Self-pay | Admitting: Family Medicine

## 2018-06-11 DIAGNOSIS — R748 Abnormal levels of other serum enzymes: Secondary | ICD-10-CM | POA: Diagnosis not present

## 2018-06-11 DIAGNOSIS — K7689 Other specified diseases of liver: Secondary | ICD-10-CM | POA: Diagnosis not present

## 2018-06-18 DIAGNOSIS — M25562 Pain in left knee: Secondary | ICD-10-CM | POA: Insufficient documentation

## 2018-06-20 DIAGNOSIS — M1712 Unilateral primary osteoarthritis, left knee: Secondary | ICD-10-CM | POA: Diagnosis not present

## 2018-06-20 DIAGNOSIS — M25562 Pain in left knee: Secondary | ICD-10-CM | POA: Diagnosis not present

## 2018-06-24 ENCOUNTER — Other Ambulatory Visit: Payer: Self-pay | Admitting: Family Medicine

## 2018-06-25 NOTE — Telephone Encounter (Signed)
Lab Results  Component Value Date   CREATININE 0.94 05/30/2018

## 2018-07-15 ENCOUNTER — Ambulatory Visit: Payer: Medicare HMO | Admitting: Family Medicine

## 2018-07-15 ENCOUNTER — Ambulatory Visit (INDEPENDENT_AMBULATORY_CARE_PROVIDER_SITE_OTHER): Payer: Medicare HMO | Admitting: Nurse Practitioner

## 2018-07-15 ENCOUNTER — Encounter: Payer: Self-pay | Admitting: Nurse Practitioner

## 2018-07-15 ENCOUNTER — Other Ambulatory Visit: Payer: Self-pay

## 2018-07-15 ENCOUNTER — Telehealth: Payer: Self-pay | Admitting: Nurse Practitioner

## 2018-07-15 VITALS — BP 132/72 | HR 97 | Temp 97.7°F | Resp 14 | Ht 68.0 in | Wt 258.3 lb

## 2018-07-15 DIAGNOSIS — G4733 Obstructive sleep apnea (adult) (pediatric): Secondary | ICD-10-CM

## 2018-07-15 DIAGNOSIS — E669 Obesity, unspecified: Secondary | ICD-10-CM

## 2018-07-15 DIAGNOSIS — I77811 Abdominal aortic ectasia: Secondary | ICD-10-CM | POA: Diagnosis not present

## 2018-07-15 DIAGNOSIS — E785 Hyperlipidemia, unspecified: Secondary | ICD-10-CM | POA: Diagnosis not present

## 2018-07-15 DIAGNOSIS — Z96651 Presence of right artificial knee joint: Secondary | ICD-10-CM | POA: Diagnosis not present

## 2018-07-15 DIAGNOSIS — I10 Essential (primary) hypertension: Secondary | ICD-10-CM | POA: Diagnosis not present

## 2018-07-15 DIAGNOSIS — I517 Cardiomegaly: Secondary | ICD-10-CM

## 2018-07-15 DIAGNOSIS — R5383 Other fatigue: Secondary | ICD-10-CM

## 2018-07-15 DIAGNOSIS — E1169 Type 2 diabetes mellitus with other specified complication: Secondary | ICD-10-CM | POA: Diagnosis not present

## 2018-07-15 NOTE — Telephone Encounter (Signed)
Will you see why his ECHO has not been completed, ordered by Dr. Sanda Klein in 05/2017 and have it scheduled if it has not been completed.

## 2018-07-15 NOTE — Progress Notes (Signed)
Name: DIONTAE ROUTE   MRN: [id number]    DOB: 12/18/1950   Date:07/15/2018       Progress Note  Subjective  Chief Complaint  Chief Complaint  Patient presents with  . surgical clearance    knee replacement right    HPI  Hypertension rx lisinopril 5mg  daily, no issues with this no chest pain, blurry vision, dizziness.  BP Readings from Last 3 Encounters:  07/15/18 132/72  05/30/18 126/72  04/18/18 104/64   Obstructive Sleep Apnea Wears CPAP 100% of the time, no issues with this.   Diabetes Mellitus  rx metformin 500mg  BID no issues with this medication.  Lab Results  Component Value Date   HGBA1C 6.2 (H) 05/30/2018    Dyslipidemia Takes atorvastatin 10mg  nightly Lab Results  Component Value Date   CHOL 132 05/30/2018   HDL 37 (L) 05/30/2018   LDLCALC 71 05/30/2018   TRIG 159 (H) 05/30/2018   CHOLHDL 3.6 05/30/2018   Fatty Liver Disease Noted in ultrasound of abdomen after mildly elevated liver enzymes. Recommended diet and exercise.  Wt Readings from Last 3 Encounters:  07/15/18 258 lb 4.8 oz (117.2 kg)  05/30/18 261 lb 11.2 oz (118.7 kg)  04/18/18 257 lb 14.4 oz (117 kg)     Patient presents for surgical clearance for total knee replacement of left knee. He had TKR of right knee 04/04/2017- without any complications. Plan for April 2nd with [facility] Dr.   Patient has no known history of moderate or greater valvular stenonis or regurgitation, cardiac implantable electronic decide, pulmonary hypertension, congenital heart diease or severe systemic disease. Of note he does have bilateral atrial enlargement- ECHO ordered in 2019- do not see results; no murmur noted, and patient is asymptomatic. He notes he had an ultrasound of his heart in the past without concern.   RCRI: 0    PHQ2/9: Depression screen [facility] 2/9 07/15/2018 05/30/2018 04/18/2018 11/27/2017 05/03/2017  Decreased Interest 0 0 0 - 0  Down, Depressed, Hopeless 0 0 0 3 0  PHQ - 2 Score 0 0 0 3 0   Altered sleeping 0 0 0 1 -  Tired, decreased energy 0 0 0 1 -  Change in appetite 0 0 0 0 -  Feeling bad or failure about yourself  0 0 0 1 -  Trouble concentrating 0 0 0 0 -  Moving slowly or fidgety/restless 0 0 0 0 -  Suicidal thoughts 0 0 0 0 -  PHQ-9 Score 0 0 0 6 -  Difficult doing work/chores Not difficult at all Not difficult at all Not difficult at all Not difficult at all -  Some recent data might be hidden     PHQ reviewed. Negative  Patient Active Problem List   Diagnosis Date Noted  . Arthrofibrosis of knee joint, right 05/31/2017  . Hx of total knee arthroplasty, right 04/04/2017  . LAE (left atrial enlargement) 03/08/2017  . Right atrial enlargement 03/08/2017  . Right hip pain 12/04/2016  . Vitamin D deficiency 11/21/2016  . Physical symptoms without known medical cause 11/21/2016  . Hx of hypogonadism 11/17/2016  . Lymphadenopathy 06/20/2016  . Lymphadenopathy, axillary 06/06/2016  . Mass of right axilla 05/22/2016  . Benign neoplasm of ascending colon   . Ectatic abdominal aorta ([place]) 04/18/2016  . Bilateral low back pain with bilateral sciatica 03/26/2016  . 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 Erectile dysfunction of organic origin 09/05/2015  . Dyslipidemia 06/22/2015  . Bruxism (teeth  grinding) 04/15/2015  . Diabetes mellitus type 2 in obese ([place]) 03/19/2015  . Medication monitoring encounter 03/19/2015  . Knee pain, bilateral 03/18/2015  . Obstructive sleep apnea 03/18/2015  . Controlled substance agreement signed 03/18/2015  . Preventative health care 12/19/2014  . Special screening for malignant neoplasms, colon 12/19/2014  . Colon cancer screening 12/17/2014  . Hypertriglyceridemia 11/17/2014  . Drug-induced erectile dysfunction 11/13/2014  . Morbid obesity ([place]) 11/11/2014  . Major depressive disorder, recurrent ([place]) 11/11/2014  . Benign hypertension 11/11/2014  . Enlarged prostate 11/11/2014  . Skin exam,  screening for cancer 11/11/2014    Past Medical History:  Diagnosis Date  . Anxiety   . Arthritis    back, hands, knees  . Colon polyp 05/09/2016   TUBULAR ADENOMAS   . Depression   . Diabetes mellitus without complication ([place])    type 2  . Ectatic abdominal aorta ([place]) 04/18/2016   2.9 cm on [place] 2016, unchanged 2017  . History of skin cancer    hand  . Hyperlipidemia   . Hypertension   . Morbid obesity ([place]) 11/11/2014  . Obesity 11/11/2014  . Sleep apnea    cpap    Past Surgical History:  Procedure Laterality Date  . COLONOSCOPY WITH PROPOFOL N/A 05/09/2016   Procedure: COLONOSCOPY WITH PROPOFOL;  Surgeon: Lucilla Lame, MD;  Location: [facility] ENDOSCOPY;  Service: Endoscopy;  Laterality: N/A;  . HERNIA REPAIR    . JOINT REPLACEMENT     04-04-17 Dr. Gladstone Lighter   Right knee  . KNEE ARTHROSCOPY     several scopes done  . KNEE CLOSED REDUCTION Right 05/31/2017   Procedure: CLOSED MANIPULATION RIGHT KNEE;  Surgeon: Latanya Maudlin, MD;  Location: [facility];  Service: Orthopedics;  Laterality: Right;  Femoral Block  . MEDIAL PARTIAL KNEE REPLACEMENT Left    2000s?  Marland Kitchen SKIN CANCER EXCISION  Feb 2017   removed from right hand  . TOTAL KNEE ARTHROPLASTY Right 04/04/2017   Procedure: RIGHT TOTAL KNEE ARTHROPLASTY;  Surgeon: Latanya Maudlin, MD;  Location: [facility];  Service: Orthopedics;  Laterality: Right;    Social History   Tobacco Use  . Smoking status: Never Smoker  . Smokeless tobacco: Never Used  Substance Use Topics  . Alcohol use: No    Alcohol/week: 0.0 standard drinks     Current Outpatient Medications:  .  aspirin EC 81 MG tablet, Take 81 mg by mouth daily., Disp: , Rfl:  .  atorvastatin (LIPITOR) 10 MG tablet, Take 1 tablet (10 mg total) by mouth at bedtime., Disp: 90 tablet, Rfl: 3 .  buPROPion (WELLBUTRIN SR) 150 MG 12 hr tablet, Take 1 tablet (150 mg total) by mouth 2 (two) times daily. TAKE 150 MG BY MOUTH TWICE A DAY, Disp: 180 tablet, Rfl: 2 .   diphenhydramine-acetaminophen (TYLENOL PM) 25-500 MG TABS tablet, Take 2 tablets by mouth at bedtime as needed (sleep)., Disp: , Rfl:  .  lamoTRIgine (LAMICTAL) 200 MG tablet, Take 1 tablet (200 mg total) by mouth daily., Disp: 90 tablet, Rfl: 2 .  lisinopril (PRINIVIL,ZESTRIL) 5 MG tablet, TAKE 0.5 TABLETS (2.5 MG TOTAL) BY MOUTH DAILY., Disp: 45 tablet, Rfl: 1 .  metFORMIN (GLUCOPHAGE) 500 MG tablet, TAKE 1 TABLET BY MOUTH 2 TIMES DAILY WITH A MEAL., Disp: 60 tablet, Rfl: 5 .  Multiple Vitamins-Minerals (MULTIVITAMIN ADULT PO), Take 1 tablet by mouth daily., Disp: , Rfl:  .  naproxen (NAPROSYN) 500 MG tablet, Take 500 mg by mouth as needed. , Disp: , Rfl:  .  oxyCODONE-acetaminophen (PERCOCET/ROXICET) 5-325 MG tablet, Take 1 tablet by mouth every 6 (six) hours as needed for severe pain., Disp: 20 tablet, Rfl: 0 .  pramipexole (MIRAPEX) 0.5 MG tablet, Take 1 tablet (0.5 mg total) by mouth 2 (two) times daily., Disp: 180 tablet, Rfl: 2 .  sildenafil (REVATIO) 20 MG tablet, Take 3 to 5 tablets two hours before intercouse on an empty stomach.  Do not take with nitrates., Disp: 50 tablet, Rfl: 3 .  Turmeric 500 MG CAPS, Take 500 mg by mouth daily. , Disp: , Rfl:  .  Cholecalciferol (EQL VITAMIN D3) 2000 units CAPS, Take 2,000 Units by mouth daily. , Disp: , Rfl:  .  magnesium oxide (MAG-OX) 400 MG tablet, Take 400 mg by mouth daily., Disp: , Rfl:  .  MELATONIN GUMMIES PO, Take by mouth at bedtime as needed., Disp: , Rfl:   No Known Allergies  Review of Systems  Constitutional: Negative for chills, fever and malaise/fatigue.  Respiratory: Negative for cough and shortness of breath.   Cardiovascular: Negative for chest pain, palpitations and leg swelling.  Gastrointestinal: Negative for abdominal pain.  Musculoskeletal: Negative for joint pain and myalgias.  Skin: Negative for rash.  Neurological: Negative for dizziness and headaches.  Psychiatric/Behavioral: The patient is not nervous/anxious and  does not have insomnia.      No other specific complaints in a complete review of systems (except as listed in HPI above).  Objective  Vitals:   07/15/18 1617  BP: 132/72  Pulse: 97  Resp: 14  Temp: 97.7 F (36.5 C)  TempSrc: Oral  SpO2: 93%  Weight: 258 lb 4.8 oz (117.2 kg)  Height: 5\' 8"  (1.727 m)   Body mass index is 39.27 kg/m.  Nursing Note and Vital Signs reviewed.  Physical Exam Vitals signs reviewed.  Constitutional:      Appearance: He is well-developed.  HENT:     Head: Normocephalic and atraumatic.  Neck:     Musculoskeletal: Normal range of motion and neck supple.     Vascular: No carotid bruit.  Cardiovascular:     Rate and Rhythm: Normal rate and regular rhythm.  No extrasystoles are present.    Pulses: Normal pulses.     Heart sounds: S1 normal and S2 normal. No murmur. No systolic murmur. No diastolic murmur. No S3 or S4 sounds.   Pulmonary:     Effort: Pulmonary effort is normal.     Breath sounds: Normal breath sounds.  Abdominal:     General: Bowel sounds are normal.     Palpations: Abdomen is soft.     Tenderness: There is no abdominal tenderness.  Musculoskeletal: Normal range of motion.     Right lower leg: 1+ Pitting Edema present.     Left lower leg: 1+ Pitting Edema present.  Skin:    General: Skin is warm and dry.     Capillary Refill: Capillary refill takes less than 2 seconds.  Neurological:     Mental Status: He is alert and oriented to person, place, and time.     GCS: GCS eye subscore is 4. GCS verbal subscore is 5. GCS motor subscore is 6.     Sensory: No sensory deficit.  Psychiatric:        Speech: Speech normal.        Behavior: Behavior normal.        Thought Content: Thought content normal.        Judgment: Judgment normal.      No  results found for this or any previous visit (from the past 77 hour(s)).  Assessment & Plan  1. Diabetes mellitus type 2 in obese (HCC) Stable, continue current course of treatment.    2. Benign hypertension Stable, continue current course of treatment.   3. Dyslipidemia Stable, continue current course of treatment.   4. Ectatic abdominal aorta ([place]) Stable [place] in 2016 and 2017 next recommended 2022  5. Obstructive sleep apnea Stable, continue current course of treatment.   6. LAE (left atrial enlargement) ECHO ordered in 2019; patient states he recalls this being completed, unable to find results; Note sent to CMA to look into ECHO order   7. Right atrial enlargement ECHO ordered in 2019; patient states he recalls this being completed, unable to find results; Note sent to CMA to look into ECHO order   8. Hx of total knee arthroplasty, right Stable, no issues with procedure, positive results  9. Morbid obesity ([place]) Has been eating healthier and has lost weight since previous visit.    Chronic disease managed well stable to total knee replacement surgery

## 2018-07-16 ENCOUNTER — Other Ambulatory Visit: Payer: Self-pay

## 2018-07-16 ENCOUNTER — Telehealth: Payer: Self-pay

## 2018-07-16 ENCOUNTER — Other Ambulatory Visit: Payer: Self-pay | Admitting: Family Medicine

## 2018-07-16 DIAGNOSIS — R5383 Other fatigue: Secondary | ICD-10-CM

## 2018-07-16 NOTE — Telephone Encounter (Signed)
I called this patient to inform him that he has been scheduled to have his echocardiogram on Wednesday, August 14, 2018 at 10am, but he informed me that he is having Total Knee Replacement on 08/01/2018 and may not be ready.   I gave him the number to that dept [phone]) and ask him to give them a call.

## 2018-07-16 NOTE — Addendum Note (Signed)
Addended by: Saunders Glance A on: 07/16/2018 07:37 AM   Modules accepted: Orders

## 2018-07-16 NOTE — Telephone Encounter (Signed)
Lab Results  Component Value Date   CREATININE 0.94 05/30/2018   Lab Results  Component Value Date   K 4.0 05/30/2018

## 2018-07-16 NOTE — Telephone Encounter (Signed)
Those orders are not seen by anyone unless you go under the procedures tab. They have to be manually printed off and faxed to the cardiopulmonary dept. They will schedule it and contact the patient.  I will print it off and fax it today.

## 2018-07-22 DIAGNOSIS — Z0100 Encounter for examination of eyes and vision without abnormal findings: Secondary | ICD-10-CM | POA: Diagnosis not present

## 2018-08-01 ENCOUNTER — Encounter (HOSPITAL_COMMUNITY): Payer: Self-pay

## 2018-08-01 ENCOUNTER — Ambulatory Visit (HOSPITAL_COMMUNITY): Admit: 2018-08-01 | Payer: Medicare HMO | Admitting: Orthopedic Surgery

## 2018-08-01 SURGERY — ARTHROPLASTY, KNEE, TOTAL
Anesthesia: Spinal | Laterality: Left

## 2018-08-07 DIAGNOSIS — L309 Dermatitis, unspecified: Secondary | ICD-10-CM | POA: Diagnosis not present

## 2018-08-07 DIAGNOSIS — L219 Seborrheic dermatitis, unspecified: Secondary | ICD-10-CM | POA: Diagnosis not present

## 2018-08-07 DIAGNOSIS — Z85828 Personal history of other malignant neoplasm of skin: Secondary | ICD-10-CM | POA: Diagnosis not present

## 2018-08-14 ENCOUNTER — Ambulatory Visit: Payer: Medicare HMO

## 2018-09-06 ENCOUNTER — Telehealth: Payer: Self-pay

## 2018-09-06 NOTE — Telephone Encounter (Signed)
If surgery is still on patient will need in office visit

## 2018-09-06 NOTE — Telephone Encounter (Signed)
Does this need to be a in office visit?

## 2018-09-06 NOTE — Telephone Encounter (Signed)
Yes - in office 

## 2018-09-06 NOTE — Telephone Encounter (Signed)
Copied from [place] [id number]. Topic: General - Other >> Sep 06, 2018 11:05 AM Percell Belt A wrote: Reason for CRM:  pt called and is now getting the left one replaced and his surgery is on 09/24/2018.  He needs another surgical clearance for his left knee.  Will he need anotht  Best number [phone]

## 2018-09-10 ENCOUNTER — Encounter: Payer: Self-pay | Admitting: Nurse Practitioner

## 2018-09-10 ENCOUNTER — Other Ambulatory Visit: Payer: Self-pay

## 2018-09-10 ENCOUNTER — Ambulatory Visit (INDEPENDENT_AMBULATORY_CARE_PROVIDER_SITE_OTHER): Payer: Medicare HMO | Admitting: Nurse Practitioner

## 2018-09-10 VITALS — BP 130/68 | HR 96 | Temp 98.0°F | Resp 12 | Ht 68.0 in | Wt 259.5 lb

## 2018-09-10 DIAGNOSIS — Z01818 Encounter for other preprocedural examination: Secondary | ICD-10-CM | POA: Diagnosis not present

## 2018-09-10 DIAGNOSIS — I77811 Abdominal aortic ectasia: Secondary | ICD-10-CM | POA: Diagnosis not present

## 2018-09-10 DIAGNOSIS — E1169 Type 2 diabetes mellitus with other specified complication: Secondary | ICD-10-CM | POA: Diagnosis not present

## 2018-09-10 DIAGNOSIS — E669 Obesity, unspecified: Secondary | ICD-10-CM

## 2018-09-10 DIAGNOSIS — I10 Essential (primary) hypertension: Secondary | ICD-10-CM

## 2018-09-10 DIAGNOSIS — G4733 Obstructive sleep apnea (adult) (pediatric): Secondary | ICD-10-CM | POA: Diagnosis not present

## 2018-09-10 DIAGNOSIS — E785 Hyperlipidemia, unspecified: Secondary | ICD-10-CM | POA: Diagnosis not present

## 2018-09-10 DIAGNOSIS — F331 Major depressive disorder, recurrent, moderate: Secondary | ICD-10-CM

## 2018-09-10 DIAGNOSIS — R69 Illness, unspecified: Secondary | ICD-10-CM | POA: Diagnosis not present

## 2018-09-10 NOTE — Progress Notes (Signed)
Name: Duane Hughes   MRN: [id number]    DOB: [birth year]   Date:09/10/2018       Progress Note  Subjective  Chief Complaint  Chief Complaint  Patient presents with  . surgical clearance    HPI  Patient presents for surgical clearance for total knee arthroplasty (left). Had right knee completed in 04/04/2017 without issues.   Patient has no known history of moderate or greater valvular stenonis or regurgitation, cardiac implantable electronic decide, pulmonary hypertension, congenital heart diease or severe systemic disease.   Patient has ECHO scheduled 09/20/2018- no murmur noted this was recommended non-urgent by PCP from 2017. Surgery is scheduled 09/24/2018.   Patient is able to complete >4MET of activity without cardiac limitations. Examples include: climb a flight of stairs- but states can be limited by pain; moderate recreational activity- golfing 2-3 times a week, maybe 3 hours at time.  RCRI 0 points.    Hypertension rx lisinopril 5mg  daily, no issues with this no chest pain, blurry vision, dizziness.  BP Readings from Last 3 Encounters:  09/10/18 130/68  07/15/18 132/72  05/30/18 126/72   OSA Self reported CPAP compliance 100%  DM Taking metformin and ACEi for nephroprotection. Denies polydipsia, polyuria. Polyphagia.  Lab Results  Component Value Date   HGBA1C 6.2 (H) 05/30/2018   Ectatic abdominal aorta 2.9 cm on [place] 2016, unchanged 2017. Due for [place] in Dec 2022. Denies lightheadedness, dizziness, paresthesias, palpitations or back pain.  Takes atorvastatin Lab Results  Component Value Date   CHOL 132 05/30/2018   HDL 37 (L) 05/30/2018   LDLCALC 71 05/30/2018   TRIG 159 (H) 05/30/2018   CHOLHDL 3.6 05/30/2018    MDD Treated with wellbutrin and lamictal and followed by specialty, well controlled on these medications.   PHQ2/9: Depression screen [facility] 2/9 07/15/2018 05/30/2018 04/18/2018 11/27/2017 05/03/2017  Decreased Interest 0 0 0 - 0  Down, Depressed,  Hopeless 0 0 0 3 0  PHQ - 2 Score 0 0 0 3 0  Altered sleeping 0 0 0 1 -  Tired, decreased energy 0 0 0 1 -  Change in appetite 0 0 0 0 -  Feeling bad or failure about yourself  0 0 0 1 -  Trouble concentrating 0 0 0 0 -  Moving slowly or fidgety/restless 0 0 0 0 -  Suicidal thoughts 0 0 0 0 -  PHQ-9 Score 0 0 0 6 -  Difficult doing work/chores Not difficult at all Not difficult at all Not difficult at all Not difficult at all -  Some recent data might be hidden     PHQ reviewed. Negative  Patient Active Problem List   Diagnosis Date Noted  . Arthrofibrosis of knee joint, right 05/31/2017  . Hx of total knee arthroplasty, right 04/04/2017  . LAE (left atrial enlargement) 03/08/2017  . Right atrial enlargement 03/08/2017  . Right hip pain 12/04/2016  . Vitamin D deficiency 11/21/2016  . Physical symptoms without known medical cause 11/21/2016  . Hx of hypogonadism 11/17/2016  . Lymphadenopathy 06/20/2016  . Lymphadenopathy, axillary 06/06/2016  . Mass of right axilla 05/22/2016  . Benign neoplasm of ascending colon   . Ectatic abdominal aorta ([place]) 04/18/2016  . Bilateral low back pain with bilateral sciatica 03/26/2016  . 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 Erectile dysfunction of organic origin 09/05/2015  . Dyslipidemia 06/22/2015  . Bruxism (teeth grinding) 04/15/2015  . Diabetes mellitus type 2 in obese ([place]) 03/19/2015  . Medication  monitoring encounter 03/19/2015  . Knee pain, bilateral 03/18/2015  . Obstructive sleep apnea 03/18/2015  . Controlled substance agreement signed 03/18/2015  . Preventative health care 12/19/2014  . Special screening for malignant neoplasms, colon 12/19/2014  . Colon cancer screening 12/17/2014  . Hypertriglyceridemia 11/17/2014  . Drug-induced erectile dysfunction 11/13/2014  . Morbid obesity ([place]) 11/11/2014  . Major depressive disorder, recurrent ([place]) 11/11/2014  . Benign hypertension 11/11/2014  .  Enlarged prostate 11/11/2014  . Skin exam, screening for cancer 11/11/2014    Past Medical History:  Diagnosis Date  . Anxiety   . Arthritis    back, hands, knees  . Colon polyp 05/09/2016   TUBULAR ADENOMAS   . Depression   . Diabetes mellitus without complication ([place])    type 2  . Ectatic abdominal aorta ([place]) 04/18/2016   2.9 cm on [place] 2016, unchanged 2017  . History of skin cancer    hand  . Hyperlipidemia   . Hypertension   . Morbid obesity ([place]) 11/11/2014  . Obesity 11/11/2014  . Sleep apnea    cpap    Past Surgical History:  Procedure Laterality Date  . COLONOSCOPY WITH PROPOFOL N/A 05/09/2016   Procedure: COLONOSCOPY WITH PROPOFOL;  Surgeon: Lucilla Lame, MD;  Location: [facility] ENDOSCOPY;  Service: Endoscopy;  Laterality: N/A;  . HERNIA REPAIR    . JOINT REPLACEMENT     04-04-17 Dr. Gladstone Lighter   Right knee  . KNEE ARTHROSCOPY     several scopes done  . KNEE CLOSED REDUCTION Right 05/31/2017   Procedure: CLOSED MANIPULATION RIGHT KNEE;  Surgeon: Latanya Maudlin, MD;  Location: [facility];  Service: Orthopedics;  Laterality: Right;  Femoral Block  . MEDIAL PARTIAL KNEE REPLACEMENT Left    2000s?  Marland Kitchen SKIN CANCER EXCISION  Feb 2017   removed from right hand  . TOTAL KNEE ARTHROPLASTY Right 04/04/2017   Procedure: RIGHT TOTAL KNEE ARTHROPLASTY;  Surgeon: Latanya Maudlin, MD;  Location: [facility];  Service: Orthopedics;  Laterality: Right;    Social History   Tobacco Use  . Smoking status: Never Smoker  . Smokeless tobacco: Never Used  Substance Use Topics  . Alcohol use: No    Alcohol/week: 0.0 standard drinks     Current Outpatient Medications:  .  aspirin EC 81 MG tablet, Take 81 mg by mouth daily., Disp: , Rfl:  .  atorvastatin (LIPITOR) 10 MG tablet, Take 1 tablet (10 mg total) by mouth at bedtime., Disp: 90 tablet, Rfl: 3 .  buPROPion (WELLBUTRIN SR) 150 MG 12 hr tablet, Take 1 tablet (150 mg total) by mouth 2 (two) times daily. TAKE 150 MG BY MOUTH TWICE A DAY, Disp:  180 tablet, Rfl: 2 .  diphenhydramine-acetaminophen (TYLENOL PM) 25-500 MG TABS tablet, Take 2 tablets by mouth at bedtime as needed (sleep)., Disp: , Rfl:  .  lamoTRIgine (LAMICTAL) 200 MG tablet, Take 1 tablet (200 mg total) by mouth daily., Disp: 90 tablet, Rfl: 2 .  lisinopril (PRINIVIL,ZESTRIL) 5 MG tablet, TAKE 0.5 TABLETS (2.5 MG TOTAL) BY MOUTH DAILY., Disp: 45 tablet, Rfl: 1 .  metFORMIN (GLUCOPHAGE) 500 MG tablet, TAKE 1 TABLET BY MOUTH 2 TIMES DAILY WITH A MEAL., Disp: 60 tablet, Rfl: 5 .  Multiple Vitamins-Minerals (MULTIVITAMIN ADULT PO), Take 1 tablet by mouth daily., Disp: , Rfl:  .  naproxen (NAPROSYN) 500 MG tablet, Take 500 mg by mouth as needed. , Disp: , Rfl:  .  oxyCODONE-acetaminophen (PERCOCET/ROXICET) 5-325 MG tablet, Take 1 tablet by mouth every 6 (  six) hours as needed for severe pain., Disp: 20 tablet, Rfl: 0 .  pramipexole (MIRAPEX) 0.5 MG tablet, Take 1 tablet (0.5 mg total) by mouth 2 (two) times daily., Disp: 180 tablet, Rfl: 2 .  sildenafil (REVATIO) 20 MG tablet, Take 3 to 5 tablets two hours before intercouse on an empty stomach.  Do not take with nitrates., Disp: 50 tablet, Rfl: 3 .  Turmeric 500 MG CAPS, Take 500 mg by mouth daily. , Disp: , Rfl:  .  Cholecalciferol (EQL VITAMIN D3) 2000 units CAPS, Take 2,000 Units by mouth daily. , Disp: , Rfl:  .  magnesium oxide (MAG-OX) 400 MG tablet, Take 400 mg by mouth daily., Disp: , Rfl:  .  MELATONIN GUMMIES PO, Take by mouth at bedtime as needed., Disp: , Rfl:   No Known Allergies  Review of Systems  Constitutional: Negative for chills, fever and malaise/fatigue.  HENT: Negative for congestion, sinus pain and sore throat.   Eyes: Negative for blurred vision.  Respiratory: Negative for cough and shortness of breath.   Cardiovascular: Negative for chest pain, palpitations and leg swelling.  Gastrointestinal: Negative for abdominal pain, blood in stool, constipation, diarrhea and nausea.  Genitourinary: Negative for  dysuria and hematuria.  Musculoskeletal: Positive for joint pain (left knee pain). Negative for falls.  Skin: Negative for rash.  Neurological: Negative for dizziness and headaches.  Endo/Heme/Allergies: Negative for polydipsia. Bruises/bleeds easily (takes 81mg  asa).  Psychiatric/Behavioral: The patient is not nervous/anxious and does not have insomnia.       No other specific complaints in a complete review of systems (except as listed in HPI above).  Objective  Vitals:   09/10/18 1016  BP: 130/68  Pulse: 96  Resp: 12  Temp: 98 F (36.7 C)  TempSrc: Oral  SpO2: 97%  Weight: 259 lb 8 oz (117.7 kg)  Height: 5\' 8"  (1.727 m)     Body mass index is 39.46 kg/m.  Nursing Note and Vital Signs reviewed.  Physical Exam Vitals signs reviewed.  Constitutional:      Appearance: He is well-developed.  HENT:     Head: Normocephalic and atraumatic.  Eyes:     Extraocular Movements: Extraocular movements intact.     Conjunctiva/sclera: Conjunctivae normal.     Pupils: Pupils are equal, round, and reactive to light.  Neck:     Musculoskeletal: Normal range of motion and neck supple.     Vascular: No carotid bruit.  Cardiovascular:     Pulses: Normal pulses.     Heart sounds: Normal heart sounds.  Pulmonary:     Effort: Pulmonary effort is normal.     Breath sounds: Normal breath sounds.  Abdominal:     General: Bowel sounds are normal.     Palpations: Abdomen is soft.     Tenderness: There is no abdominal tenderness.  Skin:    General: Skin is warm and dry.     Capillary Refill: Capillary refill takes less than 2 seconds.  Neurological:     Mental Status: He is alert and oriented to person, place, and time.     GCS: GCS eye subscore is 4. GCS verbal subscore is 5. GCS motor subscore is 6.     Sensory: No sensory deficit.  Psychiatric:        Mood and Affect: Mood normal.        Speech: Speech normal.        Behavior: Behavior normal.        No results  found  for this or any previous visit (from the past 48 hour(s)).  Assessment & Plan 1. Benign hypertension Well controlled, continue medication.   2. Ectatic abdominal aorta (HCC) Asymptomatic, lipid and BP well controlled, routine [place] due in Dec 2022  3. Obstructive sleep apnea Stable, compliant with CPAP  4. Diabetes mellitus type 2 in obese [facility]) Well controlled.   5. Moderate episode of recurrent major depressive disorder (HCC) Well controlled on medications   6. Dyslipidemia Lipids at goal.   EKG: sinus rhythm without ectopy, PR interval 206 ms- borderline 1st degree block. Patient is cleared for surgery from primary care standpoint- if ECHO or pre-operative blood work shows abnormalities would recommend re-evaluation.

## 2018-09-11 NOTE — H&P (Signed)
TOTAL KNEE REVISION ADMISSION H&P  Patient is being admitted for left conversion of UKR to total knee arthroplasty.  Subjective:  Chief Complaint:  Left knee OA with failed UKR.  HPI: Duane Hughes, 68 y.o. male, has a history of pain and functional disability in the left knee due to arthritis and failed previous arthroplasty and patient has failed non-surgical conservative treatments for greater than 12 weeks to include NSAID's and/or analgesics, corticosteriod injections and activity modification. The indications for the revision of the total knee arthroplasty are fracture or mechanical failure of one or components and progressive OA. Onset of symptoms was gradual starting >10 years ago with gradually worsening course since that time.  Prior procedures on the left knee(s) include unicompartmental arthroplasty.  Patient currently rates pain in the left knee(s) at 8 out of 10 with activity. There is worsening of pain with activity and weight bearing, pain that interferes with activities of daily living, pain with passive range of motion, crepitus and joint swelling.  Patient has evidence of periarticular osteophytes and joint space narrowing by imaging studies. This condition presents safety issues increasing the risk of falls. This patient has had failure of unicompartmental arthroplasty.  There is no current active infection.  Risks, benefits and expectations were discussed with the patient.  Risks including but not limited to the risk of anesthesia, blood clots, nerve damage, blood vessel damage, failure of the prosthesis, infection and up to and including death.  Patient understand the risks, benefits and expectations and wishes to proceed with surgery.   PCP: Arnetha Courser, MD  D/C Plans:       Home  Post-op Meds:       No Rx given   Tranexamic Acid:      To be given - IV   Decadron:      Is to be given  FYI:      ASA  Oxycodone (tolerated well after previous surgeries)  CPAP  DME:    Pt equipment arranged  PT:   OPPT arranged  Pharmacy:   [place], [place]   Patient Active Problem List   Diagnosis Date Noted  . Arthrofibrosis of knee joint, right 05/31/2017  . Hx of total knee arthroplasty, right 04/04/2017  . LAE (left atrial enlargement) 03/08/2017  . Right atrial enlargement 03/08/2017  . Right hip pain 12/04/2016  . Vitamin D deficiency 11/21/2016  . Physical symptoms without known medical cause 11/21/2016  . Hx of hypogonadism 11/17/2016  . Lymphadenopathy 06/20/2016  . Lymphadenopathy, axillary 06/06/2016  . Mass of right axilla 05/22/2016  . Benign neoplasm of ascending colon   . Ectatic abdominal aorta ([place]) 04/18/2016  . Bilateral low back pain with bilateral sciatica 03/26/2016  . 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 Erectile dysfunction of organic origin 09/05/2015  . Dyslipidemia 06/22/2015  . Bruxism (teeth grinding) 04/15/2015  . Diabetes mellitus type 2 in obese ([place]) 03/19/2015  . Medication monitoring encounter 03/19/2015  . Knee pain, bilateral 03/18/2015  . Obstructive sleep apnea 03/18/2015  . Controlled substance agreement signed 03/18/2015  . Preventative health care 12/19/2014  . Special screening for malignant neoplasms, colon 12/19/2014  . Colon cancer screening 12/17/2014  . Hypertriglyceridemia 11/17/2014  . Drug-induced erectile dysfunction 11/13/2014  . Morbid obesity ([place]) 11/11/2014  . Major depressive disorder, recurrent ([place]) 11/11/2014  . Benign hypertension 11/11/2014  . Enlarged prostate 11/11/2014  . Skin exam, screening for cancer 11/11/2014   Past Medical History:  Diagnosis  Date  . Anxiety   . Arthritis    back, hands, knees  . Colon polyp 05/09/2016   TUBULAR ADENOMAS   . Depression   . Diabetes mellitus without complication ([place])    type 2  . Ectatic abdominal aorta ([place]) 04/18/2016   2.9 cm on [place] 2016, unchanged 2017  . History of skin cancer    hand  .  Hyperlipidemia   . Hypertension   . Morbid obesity ([place]) 11/11/2014  . Obesity 11/11/2014  . Sleep apnea    cpap    Past Surgical History:  Procedure Laterality Date  . COLONOSCOPY WITH PROPOFOL N/A 05/09/2016   Procedure: COLONOSCOPY WITH PROPOFOL;  Surgeon: Lucilla Lame, MD;  Location: [facility];  Service: Endoscopy;  Laterality: N/A;  . HERNIA REPAIR    . JOINT REPLACEMENT     04-04-17 Dr. Gladstone Lighter   Right knee  . KNEE ARTHROSCOPY     several scopes done  . KNEE CLOSED REDUCTION Right 05/31/2017   Procedure: CLOSED MANIPULATION RIGHT KNEE;  Surgeon: Latanya Maudlin, MD;  Location: [facility];  Service: Orthopedics;  Laterality: Right;  Femoral Block  . MEDIAL PARTIAL KNEE REPLACEMENT Left    2000s?  Marland Kitchen SKIN CANCER EXCISION  Feb 2017   removed from right hand  . TOTAL KNEE ARTHROPLASTY Right 04/04/2017   Procedure: RIGHT TOTAL KNEE ARTHROPLASTY;  Surgeon: Latanya Maudlin, MD;  Location: [facility];  Service: Orthopedics;  Laterality: Right;    No current facility-administered medications for this encounter.    Current Outpatient Medications  Medication Sig Dispense Refill Last Dose  . aspirin EC 81 MG tablet Take 81 mg by mouth daily.   Taking  . atorvastatin (LIPITOR) 10 MG tablet Take 1 tablet (10 mg total) by mouth at bedtime. 90 tablet 3 Taking  . buPROPion (WELLBUTRIN SR) 150 MG 12 hr tablet Take 1 tablet (150 mg total) by mouth 2 (two) times daily. TAKE 150 MG BY MOUTH TWICE A DAY 180 tablet 2 Taking  . Cholecalciferol (EQL VITAMIN D3) 2000 units CAPS Take 2,000 Units by mouth daily.    Not Taking  . diphenhydramine-acetaminophen (TYLENOL PM) 25-500 MG TABS tablet Take 2 tablets by mouth at bedtime as needed (sleep).   Taking  . lamoTRIgine (LAMICTAL) 200 MG tablet Take 1 tablet (200 mg total) by mouth daily. 90 tablet 2 Taking  . lisinopril (PRINIVIL,ZESTRIL) 5 MG tablet TAKE 0.5 TABLETS (2.5 MG TOTAL) BY MOUTH DAILY. 45 tablet 1 Taking  . magnesium oxide (MAG-OX) 400 MG tablet Take  400 mg by mouth daily.   Not Taking  . MELATONIN GUMMIES PO Take by mouth at bedtime as needed.   Not Taking  . metFORMIN (GLUCOPHAGE) 500 MG tablet TAKE 1 TABLET BY MOUTH 2 TIMES DAILY WITH A MEAL. 60 tablet 5 Taking  . Multiple Vitamins-Minerals (MULTIVITAMIN ADULT PO) Take 1 tablet by mouth daily.   Taking  . naproxen (NAPROSYN) 500 MG tablet Take 500 mg by mouth as needed.    Taking  . oxyCODONE-acetaminophen (PERCOCET/ROXICET) 5-325 MG tablet Take 1 tablet by mouth every 6 (six) hours as needed for severe pain. 20 tablet 0 Taking  . pramipexole (MIRAPEX) 0.5 MG tablet Take 1 tablet (0.5 mg total) by mouth 2 (two) times daily. 180 tablet 2 Taking  . sildenafil (REVATIO) 20 MG tablet Take 3 to 5 tablets two hours before intercouse on an empty stomach.  Do not take with nitrates. 50 tablet 3 Taking  . Turmeric 500 MG CAPS  Take 500 mg by mouth daily.    Taking   No Known Allergies   Social History   Tobacco Use  . Smoking status: Never Smoker  . Smokeless tobacco: Never Used  Substance Use Topics  . Alcohol use: No    Alcohol/week: 0.0 standard drinks    Family History  Problem Relation Age of Onset  . Alcohol abuse Mother   . Rheum arthritis Mother   . Stroke Maternal Grandmother   . Stroke Paternal Grandfather   . Dementia Paternal Grandmother   . Cancer Neg Hx   . Diabetes Neg Hx   . Heart disease Neg Hx   . Hypertension Neg Hx   . Kidney disease Neg Hx   . Prostate cancer Neg Hx      Review of Systems  Constitutional: Positive for malaise/fatigue.  HENT: Negative.   Eyes: Negative.   Respiratory: Positive for shortness of breath (with exertion).   Cardiovascular: Negative.   Gastrointestinal: Positive for heartburn.  Genitourinary: Negative.   Musculoskeletal: Positive for back pain and joint pain.  Skin: Negative.   Neurological: Negative.   Endo/Heme/Allergies: Negative.   Psychiatric/Behavioral: Positive for depression.     Objective:  Physical Exam   Constitutional: He is oriented to person, place, and time. He appears well-developed.  HENT:  Head: Normocephalic.  Eyes: Pupils are equal, round, and reactive to light.  Neck: Neck supple. No JVD present. No tracheal deviation present. No thyromegaly present.  Cardiovascular: Normal rate, regular rhythm and intact distal pulses.  Respiratory: Effort normal and breath sounds normal. No respiratory distress. He has no wheezes.  GI: Soft. There is no abdominal tenderness. There is no guarding.  Musculoskeletal:     Left knee: He exhibits decreased range of motion, swelling and bony tenderness. He exhibits no ecchymosis, no deformity, no laceration (healed previous incision) and no erythema. Tenderness found.  Lymphadenopathy:    He has no cervical adenopathy.  Neurological: He is alert and oriented to person, place, and time.  Skin: Skin is warm and dry.  Psychiatric: He has a normal mood and affect.    Vital signs in last 24 hours: Temp:  [98 F (36.7 C)] 98 F (36.7 C) (05/12 1016) Pulse Rate:  [96] 96 (05/12 1016) Resp:  [12] 12 (05/12 1016) BP: (130)/(68) 130/68 (05/12 1016) SpO2:  [97 %] 97 % (05/12 1016) Weight:  [117.7 kg] 117.7 kg (05/12 1016)  Labs:  Estimated body mass index is 39.46 kg/m as calculated from the following:   Height as of 09/10/18: 5\' 8"  (1.727 m).   Weight as of 09/10/18: 117.7 kg.  Imaging Review Plain radiographs demonstrate severe degenerative joint disease of the left knee.  The bone quality appears to be good for age and reported activity level. There is a lateral UKR.    Assessment/Plan:  End stage arthritis, left knee with failed previous lateral UKR.   The patient history, physical examination, clinical judgment of the provider and imaging studies are consistent with end stage degenerative joint disease of the left knee, previous UKR. Revision total knee arthroplasty is deemed medically necessary. The treatment options including medical  management, injection therapy, arthroscopy and revision arthroplasty were discussed at length. The risks and benefits of revision total knee arthroplasty were presented and reviewed. The risks due to aseptic loosening, infection, stiffness, patella tracking problems, thromboembolic complications and other imponderables were discussed. The patient acknowledged the explanation, agreed to proceed with the plan and consent was signed. Patient is being admitted  for inpatient treatment for surgery, pain control, PT, OT, prophylactic antibiotics, VTE prophylaxis, progressive ambulation and ADL's and discharge planning.The patient is planning to be discharged home.     [place] Pugh [place] Minehart   PA-C  09/11/2018, 9:45 AM

## 2018-09-16 NOTE — Patient Instructions (Addendum)
Duane Hughes   Your procedure is scheduled on: 09/24/18   Report to [facility] Main  Entrance  Report to admitting at 10:00 AM   YOU NEED TO HAVE A COVID 19 TEST ON Fri. 5/22  At 12:40 pm_______, THIS TEST MUST BE DONE BEFORE SURGERY, COME TO [facility] ENTRANCE BETWEEN THE HOURS OF 900 AM AND 300 PM ON YOUR COVID TEST DATE.   Call this number if you have problems the morning of surgery [phone]    Remember: Do not eat food or drink liquids :After Midnight.  BRUSH YOUR TEETH MORNING OF SURGERY AND RINSE YOUR MOUTH OUT, NO CHEWING GUM CANDY OR MINTS.   Do not eat food After Midnight. YOU MAY HAVE CLEAR LIQUIDS FROM MIDNIGHT UNTIL 4:30AM . At 4:30AM Please finish the prescribed Pre-Surgery Gatorade drink. Nothing by mouth after you finish the Gatorade drink !  Meds you can take  Day of surgery:  Wellbutrin, Lamotrigine,Pramipexole. Do not take diabetic meds am of surgery  CLEAR LIQUID DIET   Foods Allowed                                                                     Foods Excluded  Coffee and tea, regular and decaf                             liquids that you cannot  Plain Jell-O in any flavor                                             see through such as: Fruit ices (not with fruit pulp)                                     milk, soups, orange juice  Iced Popsicles                                    All solid food Carbonated beverages, regular and diet                                    Cranberry, grape and apple juices Sports drinks like Gatorade Lightly seasoned clear broth or consume(fat free) Sugar, honey syrup  Sample Menu Breakfast                                Lunch                                     Supper Cranberry juice  Beef broth                            Chicken broth Jell-O                                     Grape juice                           Apple juice Coffee or tea                         Jell-O                                      Popsicle                                                Coffee or tea                        Coffee or tea  _____________________________________________________________________  [facility] - Preparing for Surgery Before surgery, you can play an important role.  Because skin is not sterile, your skin needs to be as free of germs as possible.  You can reduce the number of germs on your skin by washing with CHG (chlorahexidine gluconate) soap before surgery.  CHG is an antiseptic cleaner which kills germs and bonds with the skin to continue killing germs even after washing. Please DO NOT use if you have an allergy to CHG or antibacterial soaps.  If your skin becomes reddened/irritated stop using the CHG and inform your nurse when you arrive at Short Stay. Do not shave (including legs and underarms) for at least 48 hours prior to the first CHG shower.  You may shave your face/neck. Please follow these instructions carefully:  1.  Shower with CHG Soap the night before surgery and the  morning of Surgery.  2.  If you choose to wash your hair, wash your hair first as usual with your  normal  shampoo.  3.  After you shampoo, rinse your hair and body thoroughly to remove the  shampoo.                           4.  Use CHG as you would any other liquid soap.  You can apply chg directly  to the skin and wash                       Gently with a scrungie or clean washcloth.  5.  Apply the CHG Soap to your body ONLY FROM THE NECK DOWN.   Do not use on face/ open                           Wound or open sores. Avoid contact with eyes, ears mouth and genitals (private parts).                       Wash face,  Genitals (private parts) with your normal soap.             6.  Wash thoroughly, paying special attention to the area where your surgery  will be performed.  7.  Thoroughly rinse your body with warm water from the neck down.  8.  DO NOT shower/wash  with your normal soap after using and rinsing off  the CHG Soap.                9.  Pat yourself dry with a clean towel.            10.  Wear clean pajamas.            11.  Place clean sheets on your bed the night of your first shower and do not  sleep with pets. Day of Surgery : Do not apply any lotions/deodorants the morning of surgery.  Please wear clean clothes to the hospital/surgery center.  FAILURE TO FOLLOW THESE INSTRUCTIONS MAY RESULT IN THE CANCELLATION OF YOUR SURGERY PATIENT SIGNATURE_________________________________  NURSE SIGNATURE__________________________________  ________________________________________________________________________   Duane Hughes  An incentive spirometer is a tool that can help keep your lungs clear and active. This tool measures how well you are filling your lungs with each breath. Taking long deep breaths may help reverse or decrease the chance of developing breathing (pulmonary) problems (especially infection) following:  A long period of time when you are unable to move or be active. BEFORE THE PROCEDURE   If the spirometer includes an indicator to show your best effort, your nurse or respiratory therapist will set it to a desired goal.  If possible, sit up straight or lean slightly forward. Try not to slouch.  Hold the incentive spirometer in an upright position. INSTRUCTIONS FOR USE  1. Sit on the edge of your bed if possible, or sit up as far as you can in bed or on a chair. 2. Hold the incentive spirometer in an upright position. 3. Breathe out normally. 4. Place the mouthpiece in your mouth and seal your lips tightly around it. 5. Breathe in slowly and as deeply as possible, raising the piston or the ball toward the top of the column. 6. Hold your breath for 3-5 seconds or for as long as possible. Allow the piston or ball to fall to the bottom of the column. 7. Remove the mouthpiece from your mouth and breathe out  normally. 8. Rest for a few seconds and repeat Steps 1 through 7 at least 10 times every 1-2 hours when you are awake. Take your time and take a few normal breaths between deep breaths. 9. The spirometer may include an indicator to show your best effort. Use the indicator as a goal to work toward during each repetition. 10. After each set of 10 deep breaths, practice coughing to be sure your lungs are clear. If you have an incision (the cut made at the time of surgery), support your incision when coughing by placing a pillow or rolled up towels firmly against it. Once you are able to get out of bed, walk around indoors and cough well. You may stop using the incentive spirometer when instructed by your caregiver.  RISKS AND COMPLICATIONS  Take your time so you do not get dizzy or light-headed.  If you are in pain, you may need to take or ask for pain medication before doing incentive spirometry. It is harder to take a deep breath if you are having pain. AFTER USE  Rest and breathe slowly and easily.  It can be helpful to keep track of a log of your progress. Your caregiver can provide you with a simple table to help with this. If you are using the spirometer at home, follow these instructions: [place] IF:   You are having difficultly using the spirometer.  You have trouble using the spirometer as often as instructed.  Your pain medication is not giving enough relief while using the spirometer.  You develop fever of 100.5 F (38.1 C) or higher. SEEK IMMEDIATE MEDICAL CARE IF:   You cough up bloody sputum that had not been present before.  You develop fever of 102 F (38.9 C) or greater.  You develop worsening pain at or near the incision site. MAKE SURE YOU:   Understand these instructions.  Will watch your condition.  Will get help right away if you are not doing well or get worse. Document Released: 08/28/2006 Document Revised: 07/10/2011 Document Reviewed:  10/29/2006 ExitCare Patient Information 2014 ExitCare, [place].   ________________________________________________________________________   Take these medicines the morning of surgery with A SIP OF WATER:  DO NOT TAKE ANY DIABETIC MEDICATIONS DAY OF YOUR SURGERY                               You may not have any metal on your body including hair pins and              piercings  Do not wear jewelry, make-up, lotions, powders or perfumes, deodorant             Do not wear nail polish.  Do not shave  48 hours prior to surgery.              Men may shave face and neck.   Do not bring valuables to the hospital. [place].  Contacts, dentures or bridgework may not be worn into surgery.  Leave suitcase in the car. After surgery it may be brought to your room.     Patients discharged the day of surgery will not be allowed to drive home. IF YOU ARE HAVING SURGERY AND GOING HOME THE SAME DAY, YOU MUST HAVE AN ADULT TO DRIVE YOU HOME AND BE WITH YOU FOR 24 HOURS. YOU MAY GO HOME BY TAXI OR UBER OR ORTHERWISE, BUT AN ADULT MUST ACCOMPANY YOU HOME AND STAY WITH YOU FOR 24 HOURS.  Name and phone number of your driver:  Special Instructions: N/A              Please read over the following fact sheets you were given: _____________________________________________________________________

## 2018-09-16 NOTE — Progress Notes (Signed)
SPOKE W/  _Patient     SCREENING SYMPTOMS OF COVID 19:   COUGH--no  RUNNY NOSE--- no  SORE THROAT---no  NASAL CONGESTION----no  SNEEZING----no  SHORTNESS OF BREATH---no  DIFFICULTY BREATHING--no-  TEMP >100.0 -----no  UNEXPLAINED BODY ACHES---no---  CHILLS ------no--   HEADACHES -no--------  LOSS OF SMELL/ TASTE ----no----    HAVE YOU OR ANY FAMILY MEMBER TRAVELLED PAST 14 DAYS OUT OF THE   COUNTY---yes for work STATE----no COUNTRY---- no HAVE YOU OR ANY FAMILY MEMBER BEEN EXPOSED TO ANYONE WITH COVID 19?  no

## 2018-09-17 ENCOUNTER — Encounter (HOSPITAL_COMMUNITY)
Admission: RE | Admit: 2018-09-17 | Discharge: 2018-09-17 | Disposition: A | Discharge status: Home / Self Care | Payer: Medicare HMO | Source: Ambulatory Visit | Attending: Orthopedic Surgery | Admitting: Orthopedic Surgery

## 2018-09-17 ENCOUNTER — Encounter (HOSPITAL_COMMUNITY): Payer: Self-pay

## 2018-09-17 ENCOUNTER — Other Ambulatory Visit: Payer: Self-pay

## 2018-09-17 DIAGNOSIS — M1712 Unilateral primary osteoarthritis, left knee: Secondary | ICD-10-CM | POA: Diagnosis not present

## 2018-09-17 DIAGNOSIS — Z1159 Encounter for screening for other viral diseases: Secondary | ICD-10-CM | POA: Insufficient documentation

## 2018-09-17 DIAGNOSIS — Z01812 Encounter for preprocedural laboratory examination: Secondary | ICD-10-CM | POA: Diagnosis not present

## 2018-09-17 HISTORY — DX: Personal history of other diseases of the digestive system: Z87.19

## 2018-09-17 LAB — CBC
HCT: 47.2 % (ref 39.0–52.0)
Hemoglobin: 15.5 g/dL (ref 13.0–17.0)
MCH: 32.2 pg (ref 26.0–34.0)
MCHC: 32.8 g/dL (ref 30.0–36.0)
MCV: 97.9 fL (ref 80.0–100.0)
Platelets: 221 10*3/uL (ref 150–400)
RBC: 4.82 MIL/uL (ref 4.22–5.81)
RDW: 12.5 % (ref 11.5–15.5)
WBC: 6 10*3/uL (ref 4.0–10.5)
nRBC: 0 % (ref 0.0–0.2)

## 2018-09-17 LAB — BASIC METABOLIC PANEL
Anion gap: 6 (ref 5–15)
BUN: 15 mg/dL (ref 8–23)
CO2: 25 mmol/L (ref 22–32)
Calcium: 8.5 mg/dL — ABNORMAL LOW (ref 8.9–10.3)
Chloride: 110 mmol/L (ref 98–111)
Creatinine, Ser: 0.85 mg/dL (ref 0.61–1.24)
GFR calc Af Amer: >60 mL/min (ref >60)
GFR calc non Af Amer: >60 mL/min (ref >60)
Glucose, Bld: 160 mg/dL — ABNORMAL HIGH (ref 70–99)
Potassium: 4.1 mmol/L (ref 3.5–5.1)
Sodium: 141 mmol/L (ref 135–145)

## 2018-09-17 LAB — GLUCOSE, CAPILLARY: Glucose-Capillary: 147 mg/dL — ABNORMAL HIGH (ref 70–99)

## 2018-09-17 LAB — SURGICAL PCR SCREEN
MRSA, PCR: NEGATIVE
Staphylococcus aureus: NEGATIVE

## 2018-09-17 NOTE — Progress Notes (Signed)
  Vincente Liberty PA   PCP:Dr. Sanda Klein  CARDIOLOGIST:none  INFO IN Epic:Labs, EKG  INFO ON CHART:  BLOOD THINNERS AND LAST DOSES:ASA stopped 09/17/18 Per Dr. Alvan Dame ____________________________________  PATIENT SYMPTOMS AT TIME OF PREOP:no   Pt instructed to bring C-PAP mask and tubing on DOS

## 2018-09-20 ENCOUNTER — Other Ambulatory Visit: Payer: Self-pay

## 2018-09-20 ENCOUNTER — Other Ambulatory Visit (HOSPITAL_COMMUNITY)
Admission: RE | Admit: 2018-09-20 | Discharge: 2018-09-20 | Disposition: A | Discharge status: Home / Self Care | Payer: Medicare HMO | Source: Ambulatory Visit | Attending: Orthopedic Surgery | Admitting: Orthopedic Surgery

## 2018-09-20 ENCOUNTER — Ambulatory Visit
Admission: RE | Admit: 2018-09-20 | Discharge: 2018-09-20 | Disposition: A | Discharge status: Home / Self Care | Payer: Medicare HMO | Source: Ambulatory Visit | Attending: Nurse Practitioner | Admitting: Nurse Practitioner

## 2018-09-20 DIAGNOSIS — E119 Type 2 diabetes mellitus without complications: Secondary | ICD-10-CM | POA: Diagnosis not present

## 2018-09-20 DIAGNOSIS — I10 Essential (primary) hypertension: Secondary | ICD-10-CM | POA: Insufficient documentation

## 2018-09-20 DIAGNOSIS — R5383 Other fatigue: Secondary | ICD-10-CM | POA: Diagnosis not present

## 2018-09-20 DIAGNOSIS — I77819 Aortic ectasia, unspecified site: Secondary | ICD-10-CM | POA: Diagnosis not present

## 2018-09-20 DIAGNOSIS — E785 Hyperlipidemia, unspecified: Secondary | ICD-10-CM | POA: Diagnosis not present

## 2018-09-20 DIAGNOSIS — I77810 Thoracic aortic ectasia: Secondary | ICD-10-CM | POA: Diagnosis not present

## 2018-09-20 DIAGNOSIS — Z01812 Encounter for preprocedural laboratory examination: Secondary | ICD-10-CM | POA: Diagnosis not present

## 2018-09-20 NOTE — Progress Notes (Signed)
Anesthesia Chart Review   Case:  [id number] Date/Time:  09/24/18 1226   Procedure:  TOTAL KNEE ARTHROPLASTY (Left ) - 70 mins   Anesthesia type:  Spinal   Pre-op diagnosis:  Left knee osteoarthritis, failed lateral unicompartmental   Location:  [facility]   Surgeon:  Paralee Cancel, MD      DISCUSSION: 68 yo never smoker with h/o depression, HLD, sleep apnea w/CPAP, DM II, HTN, anxiety, left knee OA, failed lateral unicompartmental scheduled for above procedure 09/24/18 with Dr. Paralee Cancel.   Pt seen by Suezanne Cheshire, NP 09/10/18.  Per her note, "Patient is able to complete >4MET of activity without cardiac limitations. Examples include:climb a flight of stairs- but states can be limited by pain; moderate recreational activity- golfing 2-3 times a week, maybe 3 hours at time. RCRI 0 points."  Echo completed 09/20/2018, EF 60-65%, left ventricle with normal systolic function.   Pt can proceed with planned procedure barring acute status change.  VS: BP (!) 159/75   Pulse 80   Temp 37 C (Oral)   Resp 16   Ht 5\' 8"  (1.727 m)   Wt 120.2 kg   SpO2 97%   BMI 40.29 kg/m   PROVIDERS: Lada, Satira Anis, MD is PCP    LABS: Labs reviewed: Acceptable for surgery. (all labs ordered are listed, but only abnormal results are displayed)  Labs Reviewed  GLUCOSE, CAPILLARY - Abnormal; Notable for the following components:      Result Value   Glucose-Capillary 147 (*)    All other components within normal limits  BASIC METABOLIC PANEL - Abnormal; Notable for the following components:   Glucose, Bld 160 (*)    Calcium 8.5 (*)    All other components within normal limits  SURGICAL PCR SCREEN  CBC  TYPE AND SCREEN     IMAGES:   EKG: 09/10/2018 Rate 78 bpm Sinus rhythm with borderline 1st degree AV block  Leftward axis Poor r wave progression-probable normal variant   CV: Echo 09/20/2018 IMPRESSIONS    1. The left ventricle has normal systolic function with an  ejection fraction of 60-65%. The cavity size was normal. Left ventricular diastolic Doppler parameters are consistent with impaired relaxation.  2. The right ventricle has normal systolic function. The cavity was normal. There is no increase in right ventricular wall thickness.Unable to estimate RVSP  3. There is dilatation of the aortic root. 3.6 cm. Ascending aorta appearts mldly dilated, though not measured.  FINDINGS  Left Ventricle: The left ventricle has normal systolic function, with an ejection fraction of 60-65%. The cavity size was normal. There is no increase in left ventricular wall thickness. Left ventricular diastolic Doppler parameters are consistent with  impaired relaxation.  Stress Test 07/02/15  There was no ST segment deviation noted during stress.  No T wave inversion was noted during stress.  The study is normal.  This is a low risk study.  The left ventricular ejection fraction is normal (55-65%). Past Medical History:  Diagnosis Date  . Anxiety    Dr.[facility] . well controlled  . Arthritis    back, hands, knees  . Colon polyp 05/09/2016   TUBULAR ADENOMAS   . Depression   . Diabetes mellitus without complication ([place])    type 2  . Ectatic abdominal aorta ([place]) 04/18/2016   2.9 cm on [place] 2016, unchanged 2017  . History of hiatal hernia    20 years ago  . History of skin  cancer    hand  . Hyperlipidemia   . Hypertension 2015   Dr. Sanda Klein  . Morbid obesity ([place]) 11/11/2014  . Obesity 11/11/2014  . Sleep apnea    cpap    Past Surgical History:  Procedure Laterality Date  . COLONOSCOPY WITH PROPOFOL N/A 05/09/2016   Procedure: COLONOSCOPY WITH PROPOFOL;  Surgeon: Lucilla Lame, MD;  Location: [facility] ENDOSCOPY;  Service: Endoscopy;  Laterality: N/A;  . HERNIA REPAIR     Hiatal  . JOINT REPLACEMENT     04-04-17 Dr. Gladstone Lighter   Right knee  . KNEE ARTHROSCOPY     several scopes done  . KNEE CLOSED REDUCTION Right 05/31/2017   Procedure: CLOSED MANIPULATION  RIGHT KNEE;  Surgeon: Latanya Maudlin, MD;  Location: [facility];  Service: Orthopedics;  Laterality: Right;  Femoral Block  . MEDIAL PARTIAL KNEE REPLACEMENT Left    2000s?  Marland Kitchen SKIN CANCER EXCISION  Feb 2017   removed from right hand  . TOTAL KNEE ARTHROPLASTY Right 04/04/2017   Procedure: RIGHT TOTAL KNEE ARTHROPLASTY;  Surgeon: Latanya Maudlin, MD;  Location: [facility];  Service: Orthopedics;  Laterality: Right;    MEDICATIONS: . aspirin EC 81 MG tablet  . atorvastatin (LIPITOR) 10 MG tablet  . buPROPion (WELLBUTRIN SR) 150 MG 12 hr tablet  . diphenhydramine-acetaminophen (TYLENOL PM) 25-500 MG TABS tablet  . hydrocortisone 2.5 % cream  . ketoconazole (NIZORAL) 2 % cream  . lamoTRIgine (LAMICTAL) 200 MG tablet  . lisinopril (PRINIVIL,ZESTRIL) 5 MG tablet  . metFORMIN (GLUCOPHAGE) 500 MG tablet  . Multiple Vitamins-Minerals (MULTIVITAMIN ADULT PO)  . naproxen (NAPROSYN) 500 MG tablet  . Omega-3 Fatty Acids (FISH OIL) 1200 MG CAPS  . oxyCODONE-acetaminophen (PERCOCET/ROXICET) 5-325 MG tablet  . pramipexole (MIRAPEX) 0.5 MG tablet  . sildenafil (REVATIO) 20 MG tablet  . triamcinolone cream (KENALOG) 0.1 %  . Turmeric 500 MG CAPS   No current facility-administered medications for this encounter.     Maia Plan [facility] Pre-Surgical Testing [phone] 09/20/18 3:41 PM

## 2018-09-20 NOTE — Progress Notes (Signed)
*  PRELIMINARY RESULTS* Echocardiogram 2D Echocardiogram has been performed.  Clarinda 09/20/2018, 10:30 AM

## 2018-09-20 NOTE — Anesthesia Preprocedure Evaluation (Addendum)
Anesthesia Evaluation  Patient identified by MRN, date of birth, ID band Patient awake    Reviewed: Allergy & Precautions, NPO status , Patient's Chart, lab work & pertinent test results  Airway Mallampati: III  TM Distance: >3 FB Neck ROM: Full    Dental no notable dental hx. (+) Teeth Intact, Dental Advisory Given   Pulmonary sleep apnea and Continuous Positive Airway Pressure Ventilation ,    Pulmonary exam normal breath sounds clear to auscultation       Cardiovascular hypertension, negative cardio ROS Normal cardiovascular exam Rhythm:Regular Rate:Normal  EKG: 09/10/2018 Rate 78 bpm Sinus rhythm with borderline 1st degree AV block  Leftward axis Poor r wave progression-probable normal variant   CV: Echo 09/20/2018 1. The left ventricle has normal systolic function with an ejection fraction of 60-65%. The cavity size was normal. Left ventricular diastolic Doppler parameters are consistent with impaired relaxation. 2. The right ventricle has normal systolic function. The cavity was normal. There is no increase in right ventricular wall thickness.Unable to estimate RVSP 3. There is dilatation of the aortic root. 3.6 cm. Ascending aorta appearts mldly dilated, though not measured. FINDINGS Left Ventricle: The left ventricle has normal systolic function, with an ejection fraction of 60-65%. The cavity size was normal. There is no increase in left ventricular wall thickness. Left ventricular diastolic Doppler parameters are consistent with impaired relaxation.  Stress Test 07/02/15 There was no ST segment deviation noted during stress. No T wave inversion was noted during stress. The study is normal. This is a low risk study. The left ventricular ejection fraction is normal (55-65%).   Neuro/Psych PSYCHIATRIC DISORDERS Anxiety Depression negative neurological ROS     GI/Hepatic Neg liver ROS, hiatal hernia,    Endo/Other  diabetes, Type 2, Oral Hypoglycemic AgentsMorbid obesity  Renal/GU negative Renal ROS  negative genitourinary   Musculoskeletal  (+) Arthritis , Osteoarthritis,    Abdominal   Peds negative pediatric ROS (+)  Hematology negative hematology ROS (+)   Anesthesia Other Findings   Reproductive/Obstetrics negative OB ROS                           Anesthesia Physical Anesthesia Plan  ASA: III  Anesthesia Plan: Spinal and Regional   Post-op Pain Management:  Regional for Post-op pain   Induction:   PONV Risk Score and Plan: 1 and Treatment may vary due to age or medical condition  Airway Management Planned: Natural Airway  Additional Equipment:   Intra-op Plan:   Post-operative Plan:   Informed Consent: I have reviewed the patients History and Physical, chart, labs and discussed the procedure including the risks, benefits and alternatives for the proposed anesthesia with the patient or authorized representative who has indicated his/her understanding and acceptance.     Dental advisory given  Plan Discussed with: CRNA  Anesthesia Plan Comments:       Anesthesia Quick Evaluation

## 2018-09-21 LAB — NOVEL CORONAVIRUS, NAA (HOSP ORDER, SEND-OUT TO REF LAB; TAT 18-24 HRS): SARS-CoV-2, NAA: NOT DETECTED

## 2018-09-23 MED ORDER — DEXTROSE 5 % IV SOLN
3.0000 g | INTRAVENOUS | Status: AC
  Administered 2018-09-24: 3 g via INTRAVENOUS
  Filled 2018-09-23: qty 3

## 2018-09-24 ENCOUNTER — Ambulatory Visit (HOSPITAL_COMMUNITY): Payer: Medicare HMO | Admitting: Certified Registered"

## 2018-09-24 ENCOUNTER — Observation Stay (HOSPITAL_COMMUNITY)
Admission: RE | Admit: 2018-09-24 | Discharge: 2018-09-25 | Disposition: A | Discharge status: Home / Self Care | Payer: Medicare HMO | Attending: Orthopedic Surgery | Admitting: Orthopedic Surgery

## 2018-09-24 ENCOUNTER — Encounter (HOSPITAL_COMMUNITY)
Admission: RE | Disposition: A | Discharge status: Home / Self Care | Payer: Self-pay | Source: Home / Self Care | Attending: Orthopedic Surgery

## 2018-09-24 ENCOUNTER — Ambulatory Visit (HOSPITAL_COMMUNITY): Payer: Medicare HMO | Admitting: Physician Assistant

## 2018-09-24 ENCOUNTER — Other Ambulatory Visit: Payer: Self-pay | Admitting: Nurse Practitioner

## 2018-09-24 ENCOUNTER — Encounter (HOSPITAL_COMMUNITY): Payer: Self-pay | Admitting: *Deleted

## 2018-09-24 ENCOUNTER — Other Ambulatory Visit: Payer: Self-pay

## 2018-09-24 DIAGNOSIS — Z7982 Long term (current) use of aspirin: Secondary | ICD-10-CM | POA: Diagnosis not present

## 2018-09-24 DIAGNOSIS — Z7984 Long term (current) use of oral hypoglycemic drugs: Secondary | ICD-10-CM | POA: Insufficient documentation

## 2018-09-24 DIAGNOSIS — Z6841 Body Mass Index (BMI) 40.0 and over, adult: Secondary | ICD-10-CM | POA: Insufficient documentation

## 2018-09-24 DIAGNOSIS — I714 Abdominal aortic aneurysm, without rupture, unspecified: Secondary | ICD-10-CM | POA: Insufficient documentation

## 2018-09-24 DIAGNOSIS — Z96653 Presence of artificial knee joint, bilateral: Secondary | ICD-10-CM | POA: Insufficient documentation

## 2018-09-24 DIAGNOSIS — M1712 Unilateral primary osteoarthritis, left knee: Secondary | ICD-10-CM | POA: Diagnosis not present

## 2018-09-24 DIAGNOSIS — Z79899 Other long term (current) drug therapy: Secondary | ICD-10-CM | POA: Insufficient documentation

## 2018-09-24 DIAGNOSIS — G4730 Sleep apnea, unspecified: Secondary | ICD-10-CM | POA: Diagnosis not present

## 2018-09-24 DIAGNOSIS — I10 Essential (primary) hypertension: Secondary | ICD-10-CM | POA: Insufficient documentation

## 2018-09-24 DIAGNOSIS — G8918 Other acute postprocedural pain: Secondary | ICD-10-CM | POA: Diagnosis not present

## 2018-09-24 DIAGNOSIS — T84093A Other mechanical complication of internal left knee prosthesis, initial encounter: Secondary | ICD-10-CM | POA: Diagnosis not present

## 2018-09-24 DIAGNOSIS — N401 Enlarged prostate with lower urinary tract symptoms: Secondary | ICD-10-CM | POA: Diagnosis not present

## 2018-09-24 DIAGNOSIS — I77819 Aortic ectasia, unspecified site: Secondary | ICD-10-CM | POA: Insufficient documentation

## 2018-09-24 DIAGNOSIS — E118 Type 2 diabetes mellitus with unspecified complications: Secondary | ICD-10-CM | POA: Insufficient documentation

## 2018-09-24 DIAGNOSIS — N138 Other obstructive and reflux uropathy: Secondary | ICD-10-CM | POA: Insufficient documentation

## 2018-09-24 DIAGNOSIS — M19049 Primary osteoarthritis, unspecified hand: Secondary | ICD-10-CM | POA: Insufficient documentation

## 2018-09-24 DIAGNOSIS — F419 Anxiety disorder, unspecified: Secondary | ICD-10-CM | POA: Insufficient documentation

## 2018-09-24 DIAGNOSIS — M479 Spondylosis, unspecified: Secondary | ICD-10-CM | POA: Insufficient documentation

## 2018-09-24 DIAGNOSIS — Z96652 Presence of left artificial knee joint: Secondary | ICD-10-CM

## 2018-09-24 DIAGNOSIS — F329 Major depressive disorder, single episode, unspecified: Secondary | ICD-10-CM | POA: Insufficient documentation

## 2018-09-24 DIAGNOSIS — E785 Hyperlipidemia, unspecified: Secondary | ICD-10-CM | POA: Diagnosis not present

## 2018-09-24 HISTORY — PX: TOTAL KNEE ARTHROPLASTY: SHX125

## 2018-09-24 LAB — GLUCOSE, CAPILLARY
Glucose-Capillary: 121 mg/dL — ABNORMAL HIGH (ref 70–99)
Glucose-Capillary: 151 mg/dL — ABNORMAL HIGH (ref 70–99)
Glucose-Capillary: 134 mg/dL — ABNORMAL HIGH (ref 70–99)

## 2018-09-24 LAB — TYPE AND SCREEN
ABO/RH(D): O POS
Antibody Screen: NEGATIVE

## 2018-09-24 SURGERY — ARTHROPLASTY, KNEE, TOTAL
Anesthesia: Regional | Laterality: Left

## 2018-09-24 MED ORDER — ASPIRIN 81 MG PO CHEW: 81.0000 mg | CHEWABLE_TABLET | Freq: Two times a day (BID) | ORAL | 0 refills | Status: AC

## 2018-09-24 MED ORDER — BUPROPION HCL ER (SR) 150 MG PO TB12
150.0000 mg | ORAL_TABLET | Freq: Two times a day (BID) | ORAL | Status: DC
  Administered 2018-09-24 – 2018-09-25 (×2): 150 mg via ORAL
  Filled 2018-09-24 (×2): qty 1

## 2018-09-24 MED ORDER — SODIUM CHLORIDE (PF) 0.9 % IJ SOLN
INTRAMUSCULAR | Status: DC | PRN
  Administered 2018-09-24: 30 mL

## 2018-09-24 MED ORDER — FERROUS SULFATE 325 (65 FE) MG PO TABS
325.0000 mg | ORAL_TABLET | Freq: Two times a day (BID) | ORAL | Status: DC
  Filled 2018-09-24: qty 1

## 2018-09-24 MED ORDER — ONDANSETRON HCL 4 MG/2ML IJ SOLN: 4.0000 mg | Freq: Four times a day (QID) | INTRAMUSCULAR | Status: DC | PRN

## 2018-09-24 MED ORDER — TRANEXAMIC ACID-NACL 1000-0.7 MG/100ML-% IV SOLN
1000.0000 mg | INTRAVENOUS | Status: AC
  Administered 2018-09-24: 1000 mg via INTRAVENOUS

## 2018-09-24 MED ORDER — SODIUM CHLORIDE 0.9 % IV SOLN
INTRAVENOUS | Status: DC
  Administered 2018-09-24: 18:00:00 via INTRAVENOUS

## 2018-09-24 MED ORDER — METHOCARBAMOL 500 MG IVPB - SIMPLE MED
INTRAVENOUS | Status: AC
  Filled 2018-09-24: qty 50

## 2018-09-24 MED ORDER — POLYETHYLENE GLYCOL 3350 17 G PO PACK
17.0000 g | PACK | Freq: Two times a day (BID) | ORAL | Status: DC
  Administered 2018-09-24 – 2018-09-25 (×2): 17 g via ORAL
  Filled 2018-09-24 (×2): qty 1

## 2018-09-24 MED ORDER — STERILE WATER FOR IRRIGATION IR SOLN
Status: DC | PRN
  Administered 2018-09-24: 2000 mL

## 2018-09-24 MED ORDER — FENTANYL CITRATE (PF) 100 MCG/2ML IJ SOLN
25.0000 ug | INTRAMUSCULAR | Status: DC | PRN
  Administered 2018-09-24 (×3): 50 ug via INTRAVENOUS

## 2018-09-24 MED ORDER — SUGAMMADEX SODIUM 200 MG/2ML IV SOLN
INTRAVENOUS | Status: DC | PRN
  Administered 2018-09-24: 250 mg via INTRAVENOUS

## 2018-09-24 MED ORDER — CEFAZOLIN SODIUM-DEXTROSE 2-4 GM/100ML-% IV SOLN
2.0000 g | Freq: Four times a day (QID) | INTRAVENOUS | Status: AC
  Administered 2018-09-24 – 2018-09-25 (×2): 2 g via INTRAVENOUS
  Filled 2018-09-24 (×2): qty 100

## 2018-09-24 MED ORDER — OXYCODONE HCL 5 MG PO TABS
5.0000 mg | ORAL_TABLET | ORAL | Status: DC | PRN
  Administered 2018-09-25: 5 mg via ORAL
  Filled 2018-09-24: qty 1

## 2018-09-24 MED ORDER — ASPIRIN 81 MG PO CHEW
81.0000 mg | CHEWABLE_TABLET | Freq: Two times a day (BID) | ORAL | Status: DC
  Administered 2018-09-24 – 2018-09-25 (×2): 81 mg via ORAL
  Filled 2018-09-24 (×2): qty 1

## 2018-09-24 MED ORDER — 0.9 % SODIUM CHLORIDE (POUR BTL) OPTIME
TOPICAL | Status: DC | PRN
  Administered 2018-09-24: 1000 mL

## 2018-09-24 MED ORDER — OXYCODONE HCL 5 MG PO TABS: 5.0000 mg | ORAL_TABLET | ORAL | 0 refills | Status: DC | PRN

## 2018-09-24 MED ORDER — METHOCARBAMOL 500 MG PO TABS: 500.0000 mg | ORAL_TABLET | Freq: Four times a day (QID) | ORAL | 0 refills | Status: DC | PRN

## 2018-09-24 MED ORDER — LACTATED RINGERS IV SOLN
INTRAVENOUS | Status: DC
  Administered 2018-09-24: 11:00:00 via INTRAVENOUS
  Administered 2018-09-24: 1000 mL via INTRAVENOUS

## 2018-09-24 MED ORDER — ONDANSETRON HCL 4 MG PO TABS: 4.0000 mg | ORAL_TABLET | Freq: Four times a day (QID) | ORAL | Status: DC | PRN

## 2018-09-24 MED ORDER — HYDROMORPHONE HCL 1 MG/ML IJ SOLN
0.5000 mg | INTRAMUSCULAR | Status: DC | PRN
  Administered 2018-09-24: 1 mg via INTRAVENOUS
  Filled 2018-09-24: qty 1

## 2018-09-24 MED ORDER — ALUM & MAG HYDROXIDE-SIMETH 200-200-20 MG/5ML PO SUSP: 15.0000 mL | ORAL | Status: DC | PRN

## 2018-09-24 MED ORDER — DEXAMETHASONE SODIUM PHOSPHATE 10 MG/ML IJ SOLN
10.0000 mg | Freq: Once | INTRAMUSCULAR | Status: AC
  Administered 2018-09-24: 4 mg via INTRAVENOUS

## 2018-09-24 MED ORDER — ROCURONIUM BROMIDE 10 MG/ML (PF) SYRINGE
PREFILLED_SYRINGE | INTRAVENOUS | Status: DC | PRN
  Administered 2018-09-24 (×2): 10 mg via INTRAVENOUS
  Administered 2018-09-24: 40 mg via INTRAVENOUS

## 2018-09-24 MED ORDER — METOCLOPRAMIDE HCL 5 MG/ML IJ SOLN: 5.0000 mg | Freq: Three times a day (TID) | INTRAMUSCULAR | Status: DC | PRN

## 2018-09-24 MED ORDER — OXYCODONE HCL 5 MG PO TABS
10.0000 mg | ORAL_TABLET | ORAL | Status: DC | PRN
  Administered 2018-09-25: 10 mg via ORAL
  Filled 2018-09-24: qty 2

## 2018-09-24 MED ORDER — CLONIDINE HCL (ANALGESIA) 100 MCG/ML EP SOLN
EPIDURAL | Status: DC | PRN
  Administered 2018-09-24: 100 ug

## 2018-09-24 MED ORDER — METFORMIN HCL 500 MG PO TABS
500.0000 mg | ORAL_TABLET | Freq: Two times a day (BID) | ORAL | Status: DC
  Administered 2018-09-24 – 2018-09-25 (×2): 500 mg via ORAL
  Filled 2018-09-24 (×2): qty 1

## 2018-09-24 MED ORDER — HYDROMORPHONE HCL 1 MG/ML IJ SOLN
INTRAMUSCULAR | Status: DC | PRN
  Administered 2018-09-24 (×2): 0.5 mg via INTRAVENOUS

## 2018-09-24 MED ORDER — POLYETHYLENE GLYCOL 3350 17 G PO PACK: 17.0000 g | PACK | Freq: Two times a day (BID) | ORAL | 0 refills | Status: DC

## 2018-09-24 MED ORDER — ACETAMINOPHEN 500 MG PO TABS
1000.0000 mg | ORAL_TABLET | Freq: Four times a day (QID) | ORAL | Status: DC
  Administered 2018-09-24 – 2018-09-25 (×3): 1000 mg via ORAL
  Filled 2018-09-24 (×4): qty 2

## 2018-09-24 MED ORDER — PROPOFOL 10 MG/ML IV BOLUS
INTRAVENOUS | Status: AC
  Filled 2018-09-24: qty 60

## 2018-09-24 MED ORDER — BUPIVACAINE HCL (PF) 0.5 % IJ SOLN
INTRAMUSCULAR | Status: DC | PRN
  Administered 2018-09-24: 20 mL via PERINEURAL

## 2018-09-24 MED ORDER — SUCCINYLCHOLINE CHLORIDE 200 MG/10ML IV SOSY
PREFILLED_SYRINGE | INTRAVENOUS | Status: DC | PRN
  Administered 2018-09-24: 200 mg via INTRAVENOUS

## 2018-09-24 MED ORDER — SODIUM CHLORIDE 0.9 % IR SOLN
Status: DC | PRN
  Administered 2018-09-24: 1000 mL

## 2018-09-24 MED ORDER — EPHEDRINE SULFATE-NACL 50-0.9 MG/10ML-% IV SOSY
PREFILLED_SYRINGE | INTRAVENOUS | Status: DC | PRN
  Administered 2018-09-24: 5 mg via INTRAVENOUS

## 2018-09-24 MED ORDER — CHLORHEXIDINE GLUCONATE 4 % EX LIQD
60.0000 mL | Freq: Once | CUTANEOUS | Status: AC
  Administered 2018-09-24: 4 via TOPICAL

## 2018-09-24 MED ORDER — TRANEXAMIC ACID-NACL 1000-0.7 MG/100ML-% IV SOLN
1000.0000 mg | Freq: Once | INTRAVENOUS | Status: AC
  Administered 2018-09-24: 1000 mg via INTRAVENOUS
  Filled 2018-09-24: qty 100

## 2018-09-24 MED ORDER — MIDAZOLAM HCL 2 MG/2ML IJ SOLN
1.0000 mg | INTRAMUSCULAR | Status: DC
  Administered 2018-09-24: 13:00:00 1 mg via INTRAVENOUS

## 2018-09-24 MED ORDER — HYDROMORPHONE HCL 2 MG/ML IJ SOLN
INTRAMUSCULAR | Status: AC
  Filled 2018-09-24: qty 1

## 2018-09-24 MED ORDER — METHOCARBAMOL 500 MG PO TABS
500.0000 mg | ORAL_TABLET | Freq: Four times a day (QID) | ORAL | Status: DC | PRN
  Administered 2018-09-25: 500 mg via ORAL
  Filled 2018-09-24: qty 1

## 2018-09-24 MED ORDER — SUGAMMADEX SODIUM 500 MG/5ML IV SOLN
INTRAVENOUS | Status: AC
  Filled 2018-09-24: qty 5

## 2018-09-24 MED ORDER — MAGNESIUM CITRATE PO SOLN: 1.0000 | Freq: Once | ORAL | Status: DC | PRN

## 2018-09-24 MED ORDER — SODIUM CHLORIDE (PF) 0.9 % IJ SOLN
INTRAMUSCULAR | Status: AC
  Filled 2018-09-24: qty 50

## 2018-09-24 MED ORDER — FENTANYL CITRATE (PF) 100 MCG/2ML IJ SOLN
50.0000 ug | INTRAMUSCULAR | Status: DC
  Administered 2018-09-24: 13:00:00 100 ug via INTRAVENOUS

## 2018-09-24 MED ORDER — FERROUS SULFATE 325 (65 FE) MG PO TABS: 325.0000 mg | ORAL_TABLET | Freq: Three times a day (TID) | ORAL | 3 refills | Status: DC

## 2018-09-24 MED ORDER — TRANEXAMIC ACID-NACL 1000-0.7 MG/100ML-% IV SOLN
INTRAVENOUS | Status: AC
  Filled 2018-09-24: qty 100

## 2018-09-24 MED ORDER — DEXAMETHASONE SODIUM PHOSPHATE 10 MG/ML IJ SOLN
INTRAMUSCULAR | Status: AC
  Filled 2018-09-24: qty 1

## 2018-09-24 MED ORDER — FENTANYL CITRATE (PF) 100 MCG/2ML IJ SOLN
INTRAMUSCULAR | Status: AC
  Filled 2018-09-24: qty 2

## 2018-09-24 MED ORDER — PHENOL 1.4 % MT LIQD: 1.0000 | OROMUCOSAL | Status: DC | PRN

## 2018-09-24 MED ORDER — POVIDONE-IODINE 10 % EX SWAB
2.0000 "application " | Freq: Once | CUTANEOUS | Status: AC
  Administered 2018-09-24: 2 via TOPICAL

## 2018-09-24 MED ORDER — BISACODYL 10 MG RE SUPP: 10.0000 mg | Freq: Every day | RECTAL | Status: DC | PRN

## 2018-09-24 MED ORDER — KETOROLAC TROMETHAMINE 30 MG/ML IJ SOLN
INTRAMUSCULAR | Status: DC | PRN
  Administered 2018-09-24: 30 mg via INTRA_ARTICULAR

## 2018-09-24 MED ORDER — DIPHENHYDRAMINE HCL 12.5 MG/5ML PO ELIX: 12.5000 mg | ORAL_SOLUTION | ORAL | Status: DC | PRN

## 2018-09-24 MED ORDER — ONDANSETRON HCL 4 MG/2ML IJ SOLN
INTRAMUSCULAR | Status: AC
  Filled 2018-09-24: qty 2

## 2018-09-24 MED ORDER — MENTHOL 3 MG MT LOZG: 1.0000 | LOZENGE | OROMUCOSAL | Status: DC | PRN

## 2018-09-24 MED ORDER — ATORVASTATIN CALCIUM 10 MG PO TABS
10.0000 mg | ORAL_TABLET | Freq: Every day | ORAL | Status: DC
  Administered 2018-09-24: 10 mg via ORAL
  Filled 2018-09-24: qty 1

## 2018-09-24 MED ORDER — DOCUSATE SODIUM 100 MG PO CAPS
100.0000 mg | ORAL_CAPSULE | Freq: Two times a day (BID) | ORAL | Status: DC
  Administered 2018-09-24 – 2018-09-25 (×2): 100 mg via ORAL
  Filled 2018-09-24 (×2): qty 1

## 2018-09-24 MED ORDER — MIDAZOLAM HCL 2 MG/2ML IJ SOLN
INTRAMUSCULAR | Status: AC
  Administered 2018-09-24: 1 mg via INTRAVENOUS
  Filled 2018-09-24: qty 2

## 2018-09-24 MED ORDER — ONDANSETRON HCL 4 MG/2ML IJ SOLN
INTRAMUSCULAR | Status: DC | PRN
  Administered 2018-09-24: 4 mg via INTRAVENOUS

## 2018-09-24 MED ORDER — DOCUSATE SODIUM 100 MG PO CAPS: 100.0000 mg | ORAL_CAPSULE | Freq: Two times a day (BID) | ORAL | 0 refills | Status: DC

## 2018-09-24 MED ORDER — DEXAMETHASONE SODIUM PHOSPHATE 10 MG/ML IJ SOLN
10.0000 mg | Freq: Once | INTRAMUSCULAR | Status: AC
  Administered 2018-09-25: 10 mg via INTRAVENOUS
  Filled 2018-09-24: qty 1

## 2018-09-24 MED ORDER — FENTANYL CITRATE (PF) 100 MCG/2ML IJ SOLN
INTRAMUSCULAR | Status: AC
  Administered 2018-09-24: 100 ug via INTRAVENOUS
  Filled 2018-09-24: qty 2

## 2018-09-24 MED ORDER — FENTANYL CITRATE (PF) 250 MCG/5ML IJ SOLN
INTRAMUSCULAR | Status: DC | PRN
  Administered 2018-09-24: 50 ug via INTRAVENOUS
  Administered 2018-09-24: 100 ug via INTRAVENOUS
  Administered 2018-09-24: 50 ug via INTRAVENOUS

## 2018-09-24 MED ORDER — PROPOFOL 10 MG/ML IV BOLUS
INTRAVENOUS | Status: DC | PRN
  Administered 2018-09-24: 20 mg via INTRAVENOUS
  Administered 2018-09-24: 100 mg via INTRAVENOUS
  Administered 2018-09-24 (×2): 20 mg via INTRAVENOUS

## 2018-09-24 MED ORDER — ACETAMINOPHEN 500 MG PO TABS: 1000.0000 mg | ORAL_TABLET | Freq: Three times a day (TID) | ORAL | 0 refills | Status: DC

## 2018-09-24 MED ORDER — KETOROLAC TROMETHAMINE 30 MG/ML IJ SOLN
INTRAMUSCULAR | Status: AC
  Filled 2018-09-24: qty 1

## 2018-09-24 MED ORDER — INSULIN ASPART 100 UNIT/ML ~~LOC~~ SOLN: 0.0000 [IU] | Freq: Three times a day (TID) | SUBCUTANEOUS | Status: DC

## 2018-09-24 MED ORDER — METOCLOPRAMIDE HCL 5 MG PO TABS: 5.0000 mg | ORAL_TABLET | Freq: Three times a day (TID) | ORAL | Status: DC | PRN

## 2018-09-24 MED ORDER — CELECOXIB 200 MG PO CAPS
200.0000 mg | ORAL_CAPSULE | Freq: Two times a day (BID) | ORAL | Status: DC
  Administered 2018-09-24 – 2018-09-25 (×2): 200 mg via ORAL
  Filled 2018-09-24 (×2): qty 1

## 2018-09-24 MED ORDER — ACETAMINOPHEN 500 MG PO TABS
1000.0000 mg | ORAL_TABLET | Freq: Once | ORAL | Status: AC
  Administered 2018-09-24: 975 mg via ORAL

## 2018-09-24 MED ORDER — PROPOFOL 500 MG/50ML IV EMUL
INTRAVENOUS | Status: DC | PRN
  Administered 2018-09-24: 25 ug/kg/min via INTRAVENOUS

## 2018-09-24 MED ORDER — BUPIVACAINE-EPINEPHRINE (PF) 0.25% -1:200000 IJ SOLN
INTRAMUSCULAR | Status: AC
  Filled 2018-09-24: qty 30

## 2018-09-24 MED ORDER — ACETAMINOPHEN 325 MG PO TABS
ORAL_TABLET | ORAL | Status: AC
  Administered 2018-09-24: 500 mg
  Filled 2018-09-24: qty 3

## 2018-09-24 MED ORDER — METHOCARBAMOL 500 MG IVPB - SIMPLE MED
500.0000 mg | Freq: Four times a day (QID) | INTRAVENOUS | Status: DC | PRN
  Administered 2018-09-24: 500 mg via INTRAVENOUS
  Filled 2018-09-24: qty 50

## 2018-09-24 MED ORDER — BUPIVACAINE-EPINEPHRINE (PF) 0.25% -1:200000 IJ SOLN
INTRAMUSCULAR | Status: DC | PRN
  Administered 2018-09-24: 30 mL

## 2018-09-24 MED ORDER — LAMOTRIGINE 100 MG PO TABS
200.0000 mg | ORAL_TABLET | Freq: Every day | ORAL | Status: DC
  Administered 2018-09-25: 200 mg via ORAL
  Filled 2018-09-24: qty 2

## 2018-09-24 MED ORDER — PRAMIPEXOLE DIHYDROCHLORIDE 0.25 MG PO TABS
0.5000 mg | ORAL_TABLET | Freq: Two times a day (BID) | ORAL | Status: DC
  Administered 2018-09-24 – 2018-09-25 (×2): 0.5 mg via ORAL
  Filled 2018-09-24 (×2): qty 2

## 2018-09-24 MED ORDER — PROPOFOL 10 MG/ML IV BOLUS
INTRAVENOUS | Status: AC
  Filled 2018-09-24: qty 20

## 2018-09-24 SURGICAL SUPPLY — 57 items
ATTUNE MED ANAT PAT 38 KNEE (Knees) ×1 IMPLANT
ATTUNE PS FEM LT SZ 6 CEM KNEE (Femur) ×1 IMPLANT
ATTUNE PSRP INSR SZ6 6 KNEE (Insert) ×1 IMPLANT
BAG ZIPLOCK 12X15 (MISCELLANEOUS) IMPLANT
BANDAGE ACE 6X5 VEL STRL LF (GAUZE/BANDAGES/DRESSINGS) ×3 IMPLANT
BASE TIBIAL ROT PLAT SZ 7 KNEE (Knees) IMPLANT
BLADE SAW SGTL 11.0X1.19X90.0M (BLADE) IMPLANT
BLADE SAW SGTL 13.0X1.19X90.0M (BLADE) ×2 IMPLANT
BLADE SURG SZ10 CARB STEEL (BLADE) ×4 IMPLANT
BOWL SMART MIX CTS (DISPOSABLE) ×2 IMPLANT
CEMENT HV SMART SET (Cement) ×2 IMPLANT
COVER SURGICAL LIGHT HANDLE (MISCELLANEOUS) ×2 IMPLANT
COVER WAND RF STERILE (DRAPES) IMPLANT
CUFF TOURN SGL QUICK 34 (TOURNIQUET CUFF) ×1
CUFF TRNQT CYL 34X4.125X (TOURNIQUET CUFF) ×1 IMPLANT
DECANTER SPIKE VIAL GLASS SM (MISCELLANEOUS) ×4 IMPLANT
DERMABOND ADVANCED (GAUZE/BANDAGES/DRESSINGS) ×1
DERMABOND ADVANCED .7 DNX12 (GAUZE/BANDAGES/DRESSINGS) ×1 IMPLANT
DRAPE U-SHAPE 47X51 STRL (DRAPES) ×2 IMPLANT
DRESSING AQUACEL AG SP 3.5X10 (GAUZE/BANDAGES/DRESSINGS) ×1 IMPLANT
DRSG AQUACEL AG SP 3.5X10 (GAUZE/BANDAGES/DRESSINGS) ×2
DURAPREP 26ML APPLICATOR (WOUND CARE) ×4 IMPLANT
ELECT REM PT RETURN 15FT ADLT (MISCELLANEOUS) ×2 IMPLANT
GLOVE BIO SURGEON STRL SZ 6 (GLOVE) ×2 IMPLANT
GLOVE BIOGEL PI IND STRL 6.5 (GLOVE) ×1 IMPLANT
GLOVE BIOGEL PI IND STRL 7.5 (GLOVE) ×1 IMPLANT
GLOVE BIOGEL PI IND STRL 8.5 (GLOVE) ×1 IMPLANT
GLOVE BIOGEL PI INDICATOR 6.5 (GLOVE) ×1
GLOVE BIOGEL PI INDICATOR 7.5 (GLOVE) ×1
GLOVE BIOGEL PI INDICATOR 8.5 (GLOVE) ×1
GLOVE ECLIPSE 8.0 STRL XLNG CF (GLOVE) ×2 IMPLANT
GLOVE ORTHO TXT STRL SZ7.5 (GLOVE) ×2 IMPLANT
GOWN STRL REUS W/ TWL LRG LVL3 (GOWN DISPOSABLE) ×1 IMPLANT
GOWN STRL REUS W/TWL 2XL LVL3 (GOWN DISPOSABLE) ×2 IMPLANT
GOWN STRL REUS W/TWL LRG LVL3 (GOWN DISPOSABLE) ×3 IMPLANT
HANDPIECE INTERPULSE COAX TIP (DISPOSABLE) ×1
HOLDER FOLEY CATH W/STRAP (MISCELLANEOUS) ×1 IMPLANT
KIT TURNOVER KIT A (KITS) ×1 IMPLANT
MANIFOLD NEPTUNE II (INSTRUMENTS) ×2 IMPLANT
NDL SAFETY ECLIPSE 18X1.5 (NEEDLE) IMPLANT
NEEDLE HYPO 18GX1.5 SHARP (NEEDLE) ×1
NS IRRIG 1000ML POUR BTL (IV SOLUTION) ×2 IMPLANT
PACK TOTAL KNEE CUSTOM (KITS) ×2 IMPLANT
PROTECTOR NERVE ULNAR (MISCELLANEOUS) ×2 IMPLANT
SET HNDPC FAN SPRY TIP SCT (DISPOSABLE) ×1 IMPLANT
SET PAD KNEE POSITIONER (MISCELLANEOUS) ×2 IMPLANT
SUT MNCRL AB 4-0 PS2 18 (SUTURE) ×2 IMPLANT
SUT STRATAFIX PDS+ 0 24IN (SUTURE) ×2 IMPLANT
SUT VIC AB 1 CT1 36 (SUTURE) ×2 IMPLANT
SUT VIC AB 2-0 CT1 27 (SUTURE) ×3
SUT VIC AB 2-0 CT1 TAPERPNT 27 (SUTURE) ×3 IMPLANT
SYR 3ML LL SCALE MARK (SYRINGE) ×2 IMPLANT
TIBIAL BASE ROT PLAT SZ 7 KNEE (Knees) ×2 IMPLANT
TRAY FOLEY MTR SLVR 16FR STAT (SET/KITS/TRAYS/PACK) ×2 IMPLANT
WATER STERILE IRR 1000ML POUR (IV SOLUTION) ×4 IMPLANT
WRAP KNEE MAXI GEL POST OP (GAUZE/BANDAGES/DRESSINGS) ×2 IMPLANT
YANKAUER SUCT BULB TIP 10FT TU (MISCELLANEOUS) ×2 IMPLANT

## 2018-09-24 NOTE — Progress Notes (Signed)
Pt. placed on CPAP auto at this time with oxygen placed into circuit at 2 lpm, aware to notify if needed.

## 2018-09-24 NOTE — Anesthesia Procedure Notes (Signed)
Anesthesia Regional Block: Adductor canal block   Pre-Anesthetic Checklist: ,, timeout performed, Correct Patient, Correct Site, Correct Laterality, Correct Procedure, Correct Position, site marked, Risks and benefits discussed,  Surgical consent,  Pre-op evaluation,  At surgeon's request and post-op pain management  Laterality: Left  Prep: Maximum Sterile Barrier Precautions used, chloraprep       Needles:  Injection technique: Single-shot  Needle Type: Echogenic Stimulator Needle     Needle Length: 9cm  Needle Gauge: 22     Additional Needles:   Procedures:,,,, ultrasound used (permanent image in chart),,,,  Narrative:  Start time: 09/24/2018 11:31 AM End time: 09/24/2018 11:41 AM Injection made incrementally with aspirations every 5 mL.  Performed by: Personally  Anesthesiologist: Freddrick March, MD  Additional Notes: Monitors applied. No increased pain on injection. No increased resistance to injection. Injection made in 5cc increments. Good needle visualization. Patient tolerated procedure well.

## 2018-09-24 NOTE — Discharge Instructions (Signed)

## 2018-09-24 NOTE — Anesthesia Postprocedure Evaluation (Signed)
Anesthesia Post Note  Patient: Duane Hughes  Procedure(s) Performed: REVISION LEFT KNEE ARTHROPLASTY (Left )     Patient location during evaluation: PACU Anesthesia Type: Regional and General Level of consciousness: awake and alert Pain management: pain level controlled Vital Signs Assessment: post-procedure vital signs reviewed and stable Respiratory status: spontaneous breathing, nonlabored ventilation, respiratory function stable and patient connected to nasal cannula oxygen Cardiovascular status: blood pressure returned to baseline and stable Postop Assessment: no apparent nausea or vomiting Anesthetic complications: no    Last Vitals:  Vitals:   09/24/18 1615 09/24/18 1630  BP: (!) 147/78 (!) 142/78  Pulse: 72 66  Resp: 14 10  Temp:    SpO2: 99% 98%    Last Pain:  Vitals:   09/24/18 1630  TempSrc:   PainSc: Asleep                 Chelsey L Woodrum

## 2018-09-24 NOTE — Progress Notes (Signed)
Assisted Dr Lanetta Inch with left, ultrasound guided, adductor canal block. Side rails up, monitors on throughout procedure. See vital signs in flow sheet. Tolerated Procedure well.

## 2018-09-24 NOTE — Transfer of Care (Signed)
Immediate Anesthesia Transfer of Care Note  Patient: Duane Hughes  Procedure(s) Performed: REVISION LEFT KNEE ARTHROPLASTY (Left )  Patient Location: PACU  Anesthesia Type:General  Level of Consciousness: awake, alert  and oriented  Airway & Oxygen Therapy: Patient Spontanous Breathing and Patient connected to face mask oxygen  Post-op Assessment: Report given to RN and Post -op Vital signs reviewed and stable  Post vital signs: Reviewed and stable  Last Vitals:  Vitals Value Taken Time  BP 147/83 09/24/2018  3:27 PM  Temp    Pulse 82 09/24/2018  3:28 PM  Resp 17 09/24/2018  3:28 PM  SpO2 90 % 09/24/2018  3:28 PM  Vitals shown include unvalidated device data.  Last Pain:  Vitals:   09/24/18 1241  TempSrc:   PainSc: 0-No pain      Patients Stated Pain Goal: 4 (21/58/72 7618)  Complications: No apparent anesthesia complications

## 2018-09-24 NOTE — Op Note (Signed)
NAME: Duane Hughes, Duane Hughes MEDICAL RECORD DJ:[id number] ACCOUNT [id number] DATE OF BIRTH:20-May-1950 FACILITY: [facility] LOCATION: [facility] PHYSICIAN:Montasia Chisenhall D. 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  PROCEDURE:  Conversion/revision left total knee arthroplasty utilizing DePuy components, size 6 femoral component, size 7 tibial baseplate, 6 mm posterior stabilized AOX insert size 38 anatomic patella button.  SURGEON:  Paralee Cancel, MD  ASSISTANT:  Griffith Citron, PA-C.  Note that Ms. Nehemiah Settle was present for the entirety of the case from preoperative positioning, perioperative management of the operative extremity, general facilitation of the case and primary wound closure.  ANESTHESIA:  Regional plus general anesthetic.  SPECIMENS:  None.  COMPLICATIONS:  None apparent.  DRAINS:  None.  ESTIMATED BLOOD LOSS:  Less than 100 mL.  INDICATIONS FOR PROCEDURE:  The patient is a pleasant 68 year old male who has been seen and evaluated for his left knee.  He had a history of right total knee replacement he did well with.  His left knee had progressively gotten worse.  Radiographs  reveal complete loss of joint space in the medial and patellofemoral compartments with his partial knee components intact.  He had failed conservative measures and wished to proceed with total knee arthroplasty on the left.  Risks and benefits were  discussed.  The difference between his right knee and left knee reviewed.  Consent was obtained for benefit of pain relief.  PROCEDURE IN DETAIL:  The patient was brought to the operative theater.  Once adequate anesthesia, preoperative antibiotics, Ancef  administered as well as tranexamic acid and a partial dose of Decadron, he was positioned supine.  The left lower extremity  was prepped and draped in sterile fashion with the Mayo leg holder.  A timeout was performed identifying the patient, the planned procedure and extremity.  His leg was exsanguinated, tourniquet elevated to 250 mmHg for which it remained up for 53  minutes.  He had partial lateral incision, which was extended both proximal and distal for exposure.  Soft tissue dissection was carried out to allow for exposure of the medial aspect of the joint.  Medial arthrotomy was then made.  Initial exposure was  carried out including synovectomy, partial meniscectomy as well as attempts at mobilizing the patella.  With the knee extended, the patella was then partially everted and again synovium and fat debrided around the patella.  Precut measurement of the  patella was noted to be 26-27 mm.  I resected down to 15 mm and used a 38 patellar button.  Lug holes were drilled and a metal shim was placed.  The patient was noted to have severe arthritic changes within his knee.  This made it a challenge for initial  exposure due to tightness of his knee.  Nonetheless, normal soft tissue peel off the proximal medial tibia was carried out.  With the knee exposed, I first used an osteotome to recreate the area where his notch was.  Remaining cruciate ligament was  debrided.  I then drilled the femoral canal and irrigated to try to prevent fat emboli.  Intramedullary rod was passed and at 5 degrees of valgus, I planned a 9 mm resection.  Prior to this resection, I sized the femur initially sized to be a size 7.  At  this point, the distal femoral cutting block was positioned and oscillating saw used to remove bone off the distal medial femur and then what I could get around on the medial and lateral aspect of his femoral component.  I then used an osteotome and  removed his femoral component without difficulty  or significant bone loss.  Given this, I finished up the cut off the lateral aspect of the distal femur without defect that required bone grafting or augmentation.  At this point, I was able to then work  on subluxating the tibia again noting the knee was very tight, working the release further on the medial side, the tibia eventually subluxated anteriorly.  Using extramedullary guide, I resected based off his medial advanced arthritis 2 mm.  The  extramedullary guide was pinned into position and an oscillating saw used to resect bone.  This was underneath the lateral component.  No defects noted.  Once I did this, I checked the extension gap and found that a size 5 block was stable in extension  medial and lateral.  I then checked with the alignment rod and confirmed that the cut was perpendicular in the coronal plane.  Based on the previous sizing prior to removing the femoral component, the size 7 rotation block was initially pinned into place  based off the proximal tibia cut using the C-clamp.  The 4-in-1 chamfer cuts were made noting a little bit of a rim anteriorly.  Based on the posterior cut and trying to preserve the posterior flexion gap, I elected to drop down to a size 6 and resect  and base this off the posterior cut of the previous 7 resecting 2 more mm of bone anteriorly, making this flat along the anterior portion of the femur.  The remainder of the cuts were made using the 4-in-1 cutting block for the size 6 femur.  Following  this, the box cut was made off the lateral aspect of the distal femur.  I removed posterior osteophytes.  Tibia was subluxated anteriorly and the cut surface seemed to be best fit with a size 7.  It was pinned into position, drilled and keel punched.   Medial osteophytes were removed off the medial aspect and posteromedial aspect of the tibia.  At this point, trial reduction was carried out with a 6 femur 7 tibial tray in placed and a size 6 insert.  With this, the  knee came to full extension and  flexed with the patella tracking through the trochlea without application of pressure.  Given these findings, I removed all the trial components.  We drilled sclerotic bone including bone cement within the proximal lateral tibia for cement  interdigitation.  The synovial capsular junction of the knee was injected with 0.25% Marcaine with epinephrine, 1 mL of Toradol.  The knee was irrigated copiously with normal saline solution.  The final components were opened and cement was mixed on the  back table.  The final components were then cemented in place and the knee was held in extension until cement cured.  Once the cement had cured, excessive cement was removed throughout the knee using an osteotome.  The final size 6 mm insert to match the  6 femur was then placed into the tibia and the knee was reduced.  The knee had been irrigated throughout the case and again at this point.  The extensor mechanism was reapproximated using #1 Vicryl and #1 Stratafix suture.  The remainder of the wound  was closed  with 2-0 Vicryl and a running Monocryl stitch.  The knee was cleaned, dried and dressed sterilely using surgical glue and Aquacel dressing.  The patient was then brought to the recovery room in stable condition.  Findings were reviewed with  his family.  We will keep him in the hospital overnight and be discharged tomorrow unless there are any issues.  TN/NUANCE  D:09/24/2018 T:09/24/2018 JOB:[id number]

## 2018-09-24 NOTE — Brief Op Note (Signed)
09/24/2018  2:57 PM  PATIENT:  Duane Hughes  68 y.o. male  PRE-OPERATIVE DIAGNOSIS:  Left knee osteoarthritis, failed lateral unicompartmental arthroplasty  POST-OPERATIVE DIAGNOSIS:  Left knee osteoarthritis, failed lateral unicompartmental arthroplasty  PROCEDURE:  Procedure(s) with comments: REVISION LEFT KNEE ARTHROPLASTY (Left) - 70 mins  SURGEON:  Surgeon(s) and Role:    Paralee Cancel, MD - Primary  PHYSICIAN ASSISTANT: Griffith Citron, PA-C  ANESTHESIA:   regional and general  EBL:  50 mL   BLOOD ADMINISTERED:none  DRAINS: none   LOCAL MEDICATIONS USED:  MARCAINE     SPECIMEN:  No Specimen  DISPOSITION OF SPECIMEN:  N/A  COUNTS:  YES  TOURNIQUET:   Total Tourniquet Time Documented: Thigh (Left) - 53 minutes Total: Thigh (Left) - 53 minutes   DICTATION: .Other Dictation: Dictation Number [id number]  PLAN OF CARE: Admit for overnight observation  PATIENT DISPOSITION:  PACU - hemodynamically stable.   Delay start of Pharmacological VTE agent (>24hrs) due to surgical blood loss or risk of bleeding: no

## 2018-09-24 NOTE — Anesthesia Procedure Notes (Signed)
Procedure Name: Intubation Date/Time: 09/24/2018 1:13 PM Performed by: Eben Burow, CRNA Pre-anesthesia Checklist: Patient identified, Emergency Drugs available, Suction available, Patient being monitored and Timeout performed Patient Re-evaluated:Patient Re-evaluated prior to induction Oxygen Delivery Method: Circle system utilized Preoxygenation: Pre-oxygenation with 100% oxygen Induction Type: IV induction and Rapid sequence Laryngoscope Size: Mac and 4 Grade View: Grade I Tube type: Oral Tube size: 7.5 mm Number of attempts: 1 Airway Equipment and Method: Stylet Placement Confirmation: ETT inserted through vocal cords under direct vision,  positive ETCO2 and breath sounds checked- equal and bilateral Secured at: 24 cm Tube secured with: Tape Dental Injury: Teeth and Oropharynx as per pre-operative assessment

## 2018-09-24 NOTE — Progress Notes (Signed)
Pt. set up with CPAP for h/s, made aware to notify when ready, using 2 lpm oxygen.

## 2018-09-24 NOTE — Evaluation (Signed)
Physical Therapy Evaluation Patient Details Name: Duane Hughes MRN: [id number] DOB: [birth year] Today's Date: 09/24/2018   History of Present Illness  68 yo male s/p conversion of L UKR to L TKR on 09/24/18. PMH includes R TKR, LBP with sciactica, DMII, morbid obesity, MDD, anxiety, OA, HTN.   Clinical Impression   Pt presents with L knee pain, decreased L knee ROM, difficulty performing bed mobility, increased time and effort to perform mobility tasks, and decreased activity tolerance post-surgery. Pt to benefit from acute PT to address deficits. Pt ambulated hallway distance with RW and min guard assist, verbal cuing for form and safety provided throughout. Pt educated on ankle pumps (20/hour) to perform this afternoon/evening to increase circulation, to pt's tolerance and limited by pain. PT to progress mobility as tolerated, and will continue to follow acutely.        Follow Up Recommendations Follow surgeon's recommendation for DC plan and follow-up therapies;Supervision for mobility/OOB(OPPT on 5/29)    Equipment Recommendations  None recommended by PT    Recommendations for Other Services       Precautions / Restrictions Precautions Precautions: Fall Restrictions Weight Bearing Restrictions: No Other Position/Activity Restrictions: WBAT      Mobility  Bed Mobility Overal bed mobility: Needs Assistance Bed Mobility: Supine to Sit;Sit to Supine     Supine to sit: Min guard;HOB elevated Sit to supine: Min assist;HOB elevated   General bed mobility comments: Min guard for supine to sit for safety, increased time and use of bedrails. Min assist for sit to supine for LLE lifting and placement in bed.  Transfers Overall transfer level: Needs assistance Equipment used: Rolling walker (2 wheeled) Transfers: Sit to/from Stand Sit to Stand: Min guard;From elevated surface         General transfer comment: Min guard for safety, verbal cuing for hand placement when  rising.   Ambulation/Gait Ambulation/Gait assistance: Supervision;Min guard(chair follow by PT) Gait Distance (Feet): 65 Feet Assistive device: Rolling walker (2 wheeled) Gait Pattern/deviations: Step-to pattern;Decreased step length - right;Decreased step length - left;Trunk flexed Gait velocity: decr    General Gait Details: Min guard to supervision for safety, no L knee instability noted. Veral cuing for placement in RW, upright posture, sequencing.   Stairs            Wheelchair Mobility    Modified Rankin (Stroke Patients Only)       Balance Overall balance assessment: Mild deficits observed, not formally tested                                           Pertinent Vitals/Pain Pain Assessment: 0-10 Pain Score: 4  Pain Location: L knee, L thigh  Pain Descriptors / Indicators: Sore;Aching Pain Intervention(s): Limited activity within patient's tolerance;Monitored during session;Repositioned    Home Living Family/patient expects to be discharged to:: Private residence Living Arrangements: Spouse/significant other Available Help at Discharge: Family Type of Home: House Home Access: Stairs to enter Entrance Stairs-Rails: None Entrance Stairs-Number of Steps: 3 (no rail), 5 (L rail ascending) Home Layout: Two level;Able to live on main level with bedroom/bathroom Home Equipment: Gilford Rile - 2 wheels;Bedside commode      Prior Function Level of Independence: Independent         Comments: Pt reports not using AD PTA, independent with ADLs but wife did household tasks and can assist him as needed  Hand Dominance        Extremity/Trunk Assessment   Upper Extremity Assessment Upper Extremity Assessment: Overall WFL for tasks assessed    Lower Extremity Assessment Lower Extremity Assessment: Overall WFL for tasks assessed;LLE deficits/detail LLE Deficits / Details: suspected post-surgical weakness; able to perform ankle pumps, quad set,  SLR without lift assist LLE Sensation: WNL    Cervical / Trunk Assessment Cervical / Trunk Assessment: Normal  Communication   Communication: No difficulties  Cognition Arousal/Alertness: Awake/alert Behavior During Therapy: WFL for tasks assessed/performed Overall Cognitive Status: Within Functional Limits for tasks assessed                                        General Comments      Exercises Total Joint Exercises Goniometric ROM: knee aarom ~10-45*, limited by pain and stiffness   Assessment/Plan    PT Assessment Patient needs continued PT services  PT Problem List Decreased strength;Decreased safety awareness;Decreased mobility;Decreased range of motion;Decreased activity tolerance;Decreased balance;Pain;Decreased knowledge of use of DME       PT Treatment Interventions DME instruction;Functional mobility training;Balance training;Patient/family education;Gait training;Therapeutic activities;Therapeutic exercise;Stair training    PT Goals (Current goals can be found in the Care Plan section)  Acute Rehab PT Goals Patient Stated Goal: go home to wife PT Goal Formulation: With patient Time For Goal Achievement: 10/01/18 Potential to Achieve Goals: Good    Frequency 7X/week   Barriers to discharge        Co-evaluation               AM-PAC PT "6 Clicks" Mobility  Outcome Measure Help needed turning from your back to your side while in a flat bed without using bedrails?: A Little Help needed moving from lying on your back to sitting on the side of a flat bed without using bedrails?: A Little Help needed moving to and from a bed to a chair (including a wheelchair)?: A Little Help needed standing up from a chair using your arms (e.g., wheelchair or bedside chair)?: A Little Help needed to walk in hospital room?: A Little Help needed climbing 3-5 steps with a railing? : A Lot 6 Click Score: 17    End of Session Equipment Utilized During  Treatment: Gait belt Activity Tolerance: Patient tolerated treatment well Patient left: in bed;with bed alarm set;with call bell/phone within reach(on SCD break for skin integrity) Nurse Communication: Mobility status;Other (comment)(SCDs removed for shift change break) PT Visit Diagnosis: Other abnormalities of gait and mobility (R26.89);Difficulty in walking, not elsewhere classified (R26.2)    Time: 1850-1916 PT Time Calculation (min) (ACUTE ONLY): 26 min   Charges:   PT Evaluation $PT Eval Low Complexity: 1 Low PT Treatments $Gait Training: 8-22 mins        Julien Girt, PT Acute Rehabilitation Services Pager [phone]  Office [place] 09/24/2018, 7:44 PM

## 2018-09-24 NOTE — Care Plan (Signed)
Ortho Bundle Case Management Note  Patient Details  Name: Duane Hughes MRN: [id number] Date of Birth: [birth year]  L TKA scheduled on 09-24-18 DCP:  Home with spouse.  2 story home with 3 ste.  MBR is on the 1st floor DME:  No needs. PT:  OP PT eval scheduled on 09-27-18 at [facility]                   DME Arranged:  N/A DME Agency:  NA  HH Arranged:  NA HH Agency:  NA  Additional Comments: Please contact me with any questions of if this plan should need to change.  Marianne Sofia, RN,CCM [facility]  [phone] 09/24/2018, 11:00 AM

## 2018-09-24 NOTE — Interval H&P Note (Signed)
History and Physical Interval Note:  09/24/2018 11:36 AM  Duane Hughes  has presented today for surgery, with the diagnosis of Left knee osteoarthritis, failed lateral unicompartmental.  The various methods of treatment have been discussed with the patient and family. After consideration of risks, benefits and other options for treatment, the patient has consented to  Procedure(s) with comments: TOTAL KNEE ARTHROPLASTY (Left) - 70 mins as a surgical intervention.  The patient's history has been reviewed, patient examined, no change in status, stable for surgery.  I have reviewed the patient's chart and labs.  Questions were answered to the patient's satisfaction.     [name]

## 2018-09-25 ENCOUNTER — Encounter (HOSPITAL_COMMUNITY): Payer: Self-pay | Admitting: Orthopedic Surgery

## 2018-09-25 DIAGNOSIS — M1712 Unilateral primary osteoarthritis, left knee: Secondary | ICD-10-CM | POA: Diagnosis not present

## 2018-09-25 LAB — CBC
HCT: 37 % — ABNORMAL LOW (ref 39.0–52.0)
Hemoglobin: 11.6 g/dL — ABNORMAL LOW (ref 13.0–17.0)
MCH: 31.5 pg (ref 26.0–34.0)
MCHC: 31.4 g/dL (ref 30.0–36.0)
MCV: 100.5 fL — ABNORMAL HIGH (ref 80.0–100.0)
Platelets: 219 10*3/uL (ref 150–400)
RBC: 3.68 MIL/uL — ABNORMAL LOW (ref 4.22–5.81)
RDW: 12.3 % (ref 11.5–15.5)
WBC: 8.7 10*3/uL (ref 4.0–10.5)
nRBC: 0 % (ref 0.0–0.2)

## 2018-09-25 LAB — BASIC METABOLIC PANEL
Anion gap: 11 (ref 5–15)
BUN: 14 mg/dL (ref 8–23)
CO2: 20 mmol/L — ABNORMAL LOW (ref 22–32)
Calcium: 8.1 mg/dL — ABNORMAL LOW (ref 8.9–10.3)
Chloride: 105 mmol/L (ref 98–111)
Creatinine, Ser: 0.9 mg/dL (ref 0.61–1.24)
GFR calc Af Amer: >60 mL/min (ref >60)
GFR calc non Af Amer: >60 mL/min (ref >60)
Glucose, Bld: 147 mg/dL — ABNORMAL HIGH (ref 70–99)
Potassium: 4.3 mmol/L (ref 3.5–5.1)
Sodium: 136 mmol/L (ref 135–145)

## 2018-09-25 LAB — GLUCOSE, CAPILLARY: Glucose-Capillary: 123 mg/dL — ABNORMAL HIGH (ref 70–99)

## 2018-09-25 NOTE — Progress Notes (Signed)
Physical Therapy Treatment Patient Details Name: Duane Hughes MRN: [id number] DOB: 02/28/1951 Today's Date: 09/25/2018    History of Present Illness 68 yo male s/p conversion of L UKR to L TKR on 09/24/18. PMH includes R TKR, LBP with sciactica, DMII, morbid obesity, MDD, anxiety, OA, HTN.     PT Comments    POD # 1 am session Assisted OOB. Demonstrated and instructed pt how to use belt to self assist LE as a leg lifter Assisted with amb in hallway.  General transfer comment: one VC safety with turns and to extend LE prior to sit. General Gait Details: <25% VC's on safety with turns and proper walker to self distance.   Then returned to room to perform some TE's following HEP handout.  Instructed on proper tech, freq as well as use of ICE.      Follow Up Recommendations  Follow surgeon's recommendation for DC plan and follow-up therapies;Supervision for mobility/OOB     Equipment Recommendations  None recommended by PT    Recommendations for Other Services       Precautions / Restrictions Precautions Precautions: Fall Restrictions Weight Bearing Restrictions: No Other Position/Activity Restrictions: WBAT    Mobility  Bed Mobility Overal bed mobility: Needs Assistance Bed Mobility: Supine to Sit     Supine to sit: Min guard;HOB elevated     General bed mobility comments: demonstrated and instructed on use of strap to self assist LE on/off bed   Transfers Overall transfer level: Needs assistance Equipment used: Rolling walker (2 wheeled) Transfers: Sit to/from Stand Sit to Stand: Supervision         General transfer comment: one VC safety with turns and to extend LE prior to sit  Ambulation/Gait Ambulation/Gait assistance: Supervision Gait Distance (Feet): 75 Feet Assistive device: Rolling walker (2 wheeled) Gait Pattern/deviations: Step-to pattern;Decreased step length - right;Decreased step length - left;Trunk flexed Gait velocity: decreased    General  Gait Details: <25% VC's on safety with turns and proper walker to self distance.     Stairs             Wheelchair Mobility    Modified Rankin (Stroke Patients Only)       Balance                                            Cognition Arousal/Alertness: Awake/alert Behavior During Therapy: WFL for tasks assessed/performed Overall Cognitive Status: Within Functional Limits for tasks assessed                                        Exercises   Total Knee Replacement TE's 10 reps B LE ankle pumps 10 reps towel squeezes 10 reps knee presses 10 reps heel slides  10 reps SAQ's 10 reps SLR's 10 reps ABD Followed by ICE     General Comments        Pertinent Vitals/Pain Pain Assessment: 0-10 Pain Score: 5  Pain Location: L knee, L thigh  Pain Descriptors / Indicators: Sore;Aching Pain Intervention(s): Monitored during session;Premedicated before session;Repositioned;Ice applied    Home Living                      Prior Function  PT Goals (current goals can now be found in the care plan section) Progress towards PT goals: Progressing toward goals    Frequency    7X/week      PT Plan Current plan remains appropriate    Co-evaluation              AM-PAC PT "6 Clicks" Mobility   Outcome Measure  Help needed turning from your back to your side while in a flat bed without using bedrails?: A Little Help needed moving from lying on your back to sitting on the side of a flat bed without using bedrails?: A Little Help needed moving to and from a bed to a chair (including a wheelchair)?: A Little Help needed standing up from a chair using your arms (e.g., wheelchair or bedside chair)?: A Little Help needed to walk in hospital room?: A Little Help needed climbing 3-5 steps with a railing? : A Lot 6 Click Score: 17    End of Session Equipment Utilized During Treatment: Gait belt Activity Tolerance:  Patient tolerated treatment well Patient left: in chair;with call bell/phone within reach   PT Visit Diagnosis: Other abnormalities of gait and mobility (R26.89);Difficulty in walking, not elsewhere classified (R26.2)     Time: 0981-1914 PT Time Calculation (min) (ACUTE ONLY): 34 min  Charges:  $Gait Training: 8-22 mins $Therapeutic Exercise: 8-22 mins                     Rica Koyanagi  PTA Acute  Rehabilitation Services Pager      [phone] Office      [phone]

## 2018-09-25 NOTE — Plan of Care (Signed)
  Problem: Education: Goal: Knowledge of General Education information will improve Description Including pain rating scale, medication(s)/side effects and non-pharmacologic comfort measures Outcome: Adequate for Discharge   Problem: Health Behavior/Discharge Planning: Goal: Ability to manage health-related needs will improve Outcome: Adequate for Discharge   Problem: Clinical Measurements: Goal: Ability to maintain clinical measurements within normal limits will improve Outcome: Adequate for Discharge Goal: Will remain free from infection Outcome: Adequate for Discharge Goal: Diagnostic test results will improve Outcome: Adequate for Discharge Goal: Respiratory complications will improve Outcome: Adequate for Discharge Goal: Cardiovascular complication will be avoided Outcome: Adequate for Discharge   Problem: Activity: Goal: Risk for activity intolerance will decrease Outcome: Adequate for Discharge   Problem: Nutrition: Goal: Adequate nutrition will be maintained Outcome: Adequate for Discharge   Problem: Coping: Goal: Level of anxiety will decrease Outcome: Adequate for Discharge   Problem: Elimination: Goal: Will not experience complications related to bowel motility Outcome: Adequate for Discharge Goal: Will not experience complications related to urinary retention Outcome: Adequate for Discharge   Problem: Pain Managment: Goal: General experience of comfort will improve Outcome: Adequate for Discharge   Problem: Safety: Goal: Ability to remain free from injury will improve Outcome: Adequate for Discharge   Problem: Skin Integrity: Goal: Risk for impaired skin integrity will decrease Outcome: Adequate for Discharge   Problem: Education: Goal: Knowledge of the prescribed therapeutic regimen will improve Outcome: Adequate for Discharge Goal: Individualized Educational Video(s) Outcome: Adequate for Discharge   Problem: Activity: Goal: Ability to avoid  complications of mobility impairment will improve Outcome: Adequate for Discharge Goal: Range of joint motion will improve Outcome: Adequate for Discharge   Problem: Clinical Measurements: Goal: Postoperative complications will be avoided or minimized Outcome: Adequate for Discharge   Problem: Pain Management: Goal: Pain level will decrease with appropriate interventions Outcome: Adequate for Discharge   Problem: Skin Integrity: Goal: Will show signs of wound healing Outcome: Adequate for Discharge  Pt home today with wife.  Discharge teaching done, written information given.

## 2018-09-25 NOTE — Progress Notes (Signed)
Physical Therapy Treatment Patient Details Name: Duane Hughes MRN: [id number] DOB: Jul 23, 1950 Today's Date: 09/25/2018    History of Present Illness 68 yo male s/p conversion of L UKR to L TKR on 09/24/18. PMH includes R TKR, LBP with sciactica, DMII, morbid obesity, MDD, anxiety, OA, HTN.     PT Comments    POD # 1 pm session Assisted with amb in hallway.  Practiced stairs.  Assisted to bathroom to void then back to bed.  Demonstrated and instructed pt how to use belt to self assist LE as a leg lifter Addressed all mobility questions, discussed appropriate activity, educated on use of ICE.  Pt ready for D/C to home.   Follow Up Recommendations  Follow surgeon's recommendation for DC plan and follow-up therapies;Supervision for mobility/OOB     Equipment Recommendations  None recommended by PT    Recommendations for Other Services       Precautions / Restrictions Precautions Precautions: Fall Restrictions Weight Bearing Restrictions: No Other Position/Activity Restrictions: WBAT    Mobility  Bed Mobility Overal bed mobility: Needs Assistance Bed Mobility: Supine to Sit     Supine to sit: Min guard;HOB elevated     General bed mobility comments: OOB in recliner   Transfers Overall transfer level: Needs assistance Equipment used: Rolling walker (2 wheeled) Transfers: Sit to/from Stand Sit to Stand: Supervision         General transfer comment: one VC safety with turns and to extend LE prior to sit  Ambulation/Gait Ambulation/Gait assistance: Supervision Gait Distance (Feet): 75 Feet Assistive device: Rolling walker (2 wheeled) Gait Pattern/deviations: Step-to pattern;Decreased step length - right;Decreased step length - left;Trunk flexed Gait velocity: decreased    General Gait Details: <25% VC's on safety with turns and proper walker to self distance.     Stairs Stairs: Yes Stairs assistance: Min assist Stair Management: No rails;Step to  pattern;Backwards;With walker Number of Stairs: 2 General stair comments: 25% VC's on proper tech and safety    Wheelchair Mobility    Modified Rankin (Stroke Patients Only)       Balance                                            Cognition Arousal/Alertness: Awake/alert Behavior During Therapy: WFL for tasks assessed/performed Overall Cognitive Status: Within Functional Limits for tasks assessed                                        Exercises      General Comments        Pertinent Vitals/Pain Pain Assessment: 0-10 Pain Score: 5  Pain Location: L knee, L thigh  Pain Descriptors / Indicators: Sore;Aching Pain Intervention(s): Monitored during session;Premedicated before session;Repositioned;Ice applied    Home Living                      Prior Function            PT Goals (current goals can now be found in the care plan section) Progress towards PT goals: Progressing toward goals    Frequency    7X/week      PT Plan Current plan remains appropriate    Co-evaluation  AM-PAC PT "6 Clicks" Mobility   Outcome Measure  Help needed turning from your back to your side while in a flat bed without using bedrails?: A Little Help needed moving from lying on your back to sitting on the side of a flat bed without using bedrails?: A Little Help needed moving to and from a bed to a chair (including a wheelchair)?: A Little Help needed standing up from a chair using your arms (e.g., wheelchair or bedside chair)?: A Little Help needed to walk in hospital room?: A Little Help needed climbing 3-5 steps with a railing? : A Lot 6 Click Score: 17    End of Session Equipment Utilized During Treatment: Gait belt Activity Tolerance: Patient tolerated treatment well Patient left: in chair;with call bell/phone within reach Nurse Communication: Mobility status;Other (comment) PT Visit Diagnosis: Other  abnormalities of gait and mobility (R26.89);Difficulty in walking, not elsewhere classified (R26.2)     Time: 3112-1624 PT Time Calculation (min) (ACUTE ONLY): 25 min  Charges:  $Gait Training: 8-22 mins $Therapeutic Activity: 8-22 mins                     Rica Koyanagi  PTA Acute  Rehabilitation Services Pager      [phone] Office      [phone]

## 2018-09-25 NOTE — TOC Progression Note (Signed)
Transition of Care [facility]) - Progression Note    Patient Details  Name: Duane Hughes MRN: [id number] Date of Birth: Aug 03, 1950  Transition of [facility]) CM/SW Contact  Joaquin Courts, RN Phone Number: 09/25/2018, 10:20 AM  Clinical Narrative: CM spoke with pt who reports he is scheduled for OPPT. Reports has a RW and declined 3-in-1.             Expected Discharge Plan and Services           Expected Discharge Date: 09/25/18               DME Arranged: N/A DME Agency: NA       HH Arranged: NA HH Agency: NA         Social Determinants of Health (SDOH) Interventions    Readmission Risk Interventions No flowsheet data found.

## 2018-09-25 NOTE — Progress Notes (Signed)
Patient ID: Duane Hughes, male   DOB: 1950/11/12, 68 y.o.   MRN: [id number] Subjective: 1 Day Post-Op Procedure(s) (LRB): REVISION LEFT KNEE ARTHROPLASTY (Left)    Patient reports pain as mild. Doing well. Did therapy yesterday. No events  Objective:   VITALS:   Vitals:   09/25/18 0125 09/25/18 0525  BP: (!) 108/59 110/62  Pulse: 72 73  Resp: 18 18  Temp: 98.1 F (36.7 C) (!) 97.5 F (36.4 C)  SpO2: 97% 93%    Neurovascular intact Incision: dressing C/D/I  LABS Recent Labs    09/25/18 0351  HGB 11.6*  HCT 37.0*  WBC 8.7  PLT 219    Recent Labs    09/25/18 0351  NA 136  K 4.3  BUN 14  CREATININE 0.90  GLUCOSE 147*    No results for input(s): LABPT, INR in the last 72 hours.   Assessment/Plan: 1 Day Post-Op Procedure(s) (LRB): REVISION LEFT KNEE ARTHROPLASTY (Left)   Advance diet Up with therapy   Home today after therapy Reviewed goals as he has had a right TKR and this left lateral partial knee - aware of what it takes to have a successful TKR ASA for 4 weeks Pain control Outpt therapy set up

## 2018-09-26 NOTE — Discharge Summary (Signed)
Physician Discharge Summary  Patient ID: ZACARIAS KRAUTER MRN: [id number] DOB/AGE: [birth year] 68 y.o.  Admit date: 09/24/2018 Discharge date: 09/25/2018   Procedures:  Procedure(s) (LRB): REVISION LEFT KNEE ARTHROPLASTY (Left)  Attending Physician:  Dr. Paralee Cancel   Admission Diagnoses:   Left knee OA with failed UKR.  Discharge Diagnoses:  Principal Problem:   S/P left TKA Active Problems:   Status post total left knee replacement  Past Medical History:  Diagnosis Date   Anxiety    Dr.Jessica Carter . well controlled   Arthritis    back, hands, knees   Colon polyp 05/09/2016   TUBULAR ADENOMAS    Depression    Diabetes mellitus without complication ([place])    type 2   Ectatic abdominal aorta ([place]) 04/18/2016   2.9 cm on [place] 2016, unchanged 2017   History of hiatal hernia    20 years ago   History of skin cancer    hand   Hyperlipidemia    Hypertension 2015   Dr. Sanda Klein   Morbid obesity ([place]) 11/11/2014   Obesity 11/11/2014   Sleep apnea    cpap    HPI:     [name], 68 y.o. male, has a history of pain and functional disability in the left knee due to arthritis and failed previous arthroplasty and patient has failed non-surgical conservative treatments for greater than 12 weeks to include NSAID's and/or analgesics, corticosteriod injections and activity modification. The indications for the revision of the total knee arthroplasty are fracture or mechanical failure of one or components and progressive OA. Onset of symptoms was gradual starting >10 years ago with gradually worsening course since that time.  Prior procedures on the left knee(s) include unicompartmental arthroplasty. Patient currently rates pain in the left knee(s) at 8 out of 10 with activity. There is worsening of pain with activity and weight bearing, pain that interferes with activities of daily living, pain with passive range of motion, crepitus and joint swelling.  Patient has  evidence of periarticular osteophytes and joint space narrowing by imaging studies. This condition presents safety issues increasing the risk of falls. This patient has had failure of unicompartmental arthroplasty.  There is no current active infection.  Risks, benefits and expectations were discussed with the patient.  Risks including but not limited to the risk of anesthesia, blood clots, nerve damage, blood vessel damage, failure of the prosthesis, infection and up to and including death.  Patient understand the risks, benefits and expectations and wishes to proceed with surgery.   PCP: Arnetha Courser, MD   Discharged Condition: good  Hospital Course:  Patient underwent the above stated procedure on 09/24/2018. Patient tolerated the procedure well and brought to the recovery room in good condition and subsequently to the floor.  POD #1 BP: 110/62 ; Pulse: 73 ; Temp: 97.5 F (36.4 C) ; Resp: 18 Patient reports pain as mild. Doing well. Did therapy yesterday. No events. Neurovascular intact and incision: dressing C/D/I.   LABS  Basename    HGB     11.6  HCT     37.0    Discharge Exam: General appearance: alert, cooperative and no distress Extremities: Homans sign is negative, no sign of DVT, no edema, redness or tenderness in the calves or thighs and no ulcers, gangrene or trophic changes  Disposition:  Home with follow up in 2 weeks   Follow-up Information    Therapy, [facility] Go on 09/27/2018.   Specialty:  Specialist Why:  You are scheduled for a Physical Therapy appointment on 09-27-2018 at 11:00 am.  Arrival time is 10:30 am to complete paperwork.  Contact information: [place] [address] [phone]        Paralee Cancel, MD. Go on 10/07/2018.   Specialty:  Orthopedic Surgery Why:  You are scheduled for a post-operative appointment on 10-07-18 at 9:45 am.  Contact information: [address] [place] [address] [phone]         Paralee Cancel, MD In 2 weeks.   Specialty:  Orthopedic Surgery Contact information: [address] [place] [address] [phone]           Discharge Instructions    Call MD / Call 911   Complete by:  As directed    If you experience chest pain or shortness of breath, CALL 911 and be transported to the hospital emergency room.  If you develope a fever above 101 F, pus (white drainage) or increased drainage or redness at the wound, or calf pain, call your surgeon's office.   Change dressing   Complete by:  As directed    Maintain surgical dressing until follow up in the clinic. If the edges start to pull up, may reinforce with tape. If the dressing is no longer working, may remove and cover with gauze and tape, but must keep the area dry and clean.  Call with any questions or concerns.   Constipation Prevention   Complete by:  As directed    Drink plenty of fluids.  Prune juice may be helpful.  You may use a stool softener, such as Colace (over the counter) 100 mg twice a day.  Use MiraLax (over the counter) for constipation as needed.   Diet - low sodium heart healthy   Complete by:  As directed    Discharge instructions   Complete by:  As directed    Maintain surgical dressing until follow up in the clinic. If the edges start to pull up, may reinforce with tape. If the dressing is no longer working, may remove and cover with gauze and tape, but must keep the area dry and clean.  Follow up in 2 weeks at [facility]. Call with any questions or concerns.   Increase activity slowly as tolerated   Complete by:  As directed    Weight bearing as tolerated with assist device (walker, cane, etc) as directed, use it as long as suggested by your surgeon or therapist, typically at least 4-6 weeks.   TED hose   Complete by:  As directed    Use stockings (TED hose) for 2 weeks on both leg(s).  You may remove them at night for sleeping.      Allergies as of  09/25/2018   No Known Allergies     Medication List    STOP taking these medications   aspirin EC 81 MG tablet Replaced by:  aspirin 81 MG chewable tablet   diphenhydramine-acetaminophen 25-500 MG Tabs tablet Commonly known as:  TYLENOL PM   naproxen 500 MG tablet Commonly known as:  NAPROSYN   oxyCODONE-acetaminophen 5-325 MG tablet Commonly known as:  PERCOCET/ROXICET     TAKE these medications   acetaminophen 500 MG tablet Commonly known as:  TYLENOL Take 2 tablets (1,000 mg total) by mouth every 8 (eight) hours.   aspirin 81 MG chewable tablet Commonly known as:  Aspirin Childrens Chew 1 tablet (81 mg total) by mouth 2 (two) times daily for  30 days. Take for 4 weeks, then resume regular dose. Replaces:  aspirin EC 81 MG tablet   atorvastatin 10 MG tablet Commonly known as:  LIPITOR Take 1 tablet (10 mg total) by mouth at bedtime.   buPROPion 150 MG 24 hr tablet Commonly known as:  WELLBUTRIN XL Take 150 mg by mouth daily.   docusate sodium 100 MG capsule Commonly known as:  Colace Take 1 capsule (100 mg total) by mouth 2 (two) times daily.   ferrous sulfate 325 (65 FE) MG tablet Commonly known as:  FerrouSul Take 1 tablet (325 mg total) by mouth 3 (three) times daily with meals.   Fish Oil 1200 MG Caps Take 1,200 mg by mouth daily.   hydrocortisone 2.5 % cream Apply 1 application topically daily as needed (rash).   ketoconazole 2 % cream Commonly known as:  NIZORAL Apply 1 application topically daily as needed (rash).   lamoTRIgine 200 MG tablet Commonly known as:  LAMICTAL Take 1 tablet (200 mg total) by mouth daily.   lisinopril 5 MG tablet Commonly known as:  ZESTRIL TAKE 0.5 TABLETS (2.5 MG TOTAL) BY MOUTH DAILY.   metFORMIN 500 MG tablet Commonly known as:  GLUCOPHAGE TAKE 1 TABLET BY MOUTH 2 TIMES DAILY WITH A MEAL. What changed:  See the new instructions.   methocarbamol 500 MG tablet Commonly known as:  Robaxin Take 1 tablet (500 mg  total) by mouth every 6 (six) hours as needed for muscle spasms.   MULTIVITAMIN ADULT PO Take 1 tablet by mouth daily.   oxyCODONE 5 MG immediate release tablet Commonly known as:  Oxy IR/ROXICODONE Take 1-2 tablets (5-10 mg total) by mouth every 4 (four) hours as needed for moderate pain or severe pain.   polyethylene glycol 17 g packet Commonly known as:  MIRALAX / GLYCOLAX Take 17 g by mouth 2 (two) times daily.   pramipexole 0.5 MG tablet Commonly known as:  Mirapex Take 1 tablet (0.5 mg total) by mouth 2 (two) times daily.   sildenafil 20 MG tablet Commonly known as:  REVATIO Take 3 to 5 tablets two hours before intercouse on an empty stomach.  Do not take with nitrates. What changed:    how much to take  how to take this  when to take this  reasons to take this  additional instructions   triamcinolone cream 0.1 % Commonly known as:  KENALOG Apply 1 application topically daily as needed (rash).   Turmeric 500 MG Caps Take 500 mg by mouth daily.            Discharge Care Instructions  (From admission, onward)         Start     Ordered   09/25/18 0000  Change dressing    Comments:  Maintain surgical dressing until follow up in the clinic. If the edges start to pull up, may reinforce with tape. If the dressing is no longer working, may remove and cover with gauze and tape, but must keep the area dry and clean.  Call with any questions or concerns.   09/25/18 6222           Signed: [place]. Adonica Fukushima   PA-C  09/26/2018, 10:21 AM

## 2018-09-27 DIAGNOSIS — Z96652 Presence of left artificial knee joint: Secondary | ICD-10-CM | POA: Diagnosis not present

## 2018-09-29 LAB — GLUCOSE, CAPILLARY: Glucose-Capillary: 179 mg/dL — ABNORMAL HIGH (ref 70–99)

## 2018-09-30 ENCOUNTER — Encounter: Payer: Self-pay | Admitting: Family Medicine

## 2018-09-30 DIAGNOSIS — Z96652 Presence of left artificial knee joint: Secondary | ICD-10-CM | POA: Diagnosis not present

## 2018-10-03 DIAGNOSIS — Z96652 Presence of left artificial knee joint: Secondary | ICD-10-CM | POA: Diagnosis not present

## 2018-10-07 DIAGNOSIS — Z96652 Presence of left artificial knee joint: Secondary | ICD-10-CM | POA: Diagnosis not present

## 2018-10-10 DIAGNOSIS — Z96652 Presence of left artificial knee joint: Secondary | ICD-10-CM | POA: Diagnosis not present

## 2018-10-14 ENCOUNTER — Other Ambulatory Visit: Payer: Self-pay

## 2018-10-14 ENCOUNTER — Ambulatory Visit (INDEPENDENT_AMBULATORY_CARE_PROVIDER_SITE_OTHER): Payer: Medicare HMO | Admitting: Vascular Surgery

## 2018-10-14 ENCOUNTER — Encounter (INDEPENDENT_AMBULATORY_CARE_PROVIDER_SITE_OTHER): Payer: Self-pay | Admitting: Vascular Surgery

## 2018-10-14 VITALS — BP 165/82 | HR 98 | Resp 14 | Ht 68.0 in | Wt 254.0 lb

## 2018-10-14 DIAGNOSIS — Z79899 Other long term (current) drug therapy: Secondary | ICD-10-CM

## 2018-10-14 DIAGNOSIS — I10 Essential (primary) hypertension: Secondary | ICD-10-CM | POA: Diagnosis not present

## 2018-10-14 DIAGNOSIS — I714 Abdominal aortic aneurysm, without rupture, unspecified: Secondary | ICD-10-CM

## 2018-10-14 DIAGNOSIS — E669 Obesity, unspecified: Secondary | ICD-10-CM | POA: Diagnosis not present

## 2018-10-14 DIAGNOSIS — Z7984 Long term (current) use of oral hypoglycemic drugs: Secondary | ICD-10-CM | POA: Diagnosis not present

## 2018-10-14 DIAGNOSIS — E781 Pure hyperglyceridemia: Secondary | ICD-10-CM | POA: Diagnosis not present

## 2018-10-14 DIAGNOSIS — E1169 Type 2 diabetes mellitus with other specified complication: Secondary | ICD-10-CM

## 2018-10-14 DIAGNOSIS — Z96652 Presence of left artificial knee joint: Secondary | ICD-10-CM | POA: Diagnosis not present

## 2018-10-14 NOTE — Progress Notes (Signed)
MRN : [id number]  Duane Hughes is a 68 y.o. ([birth year]) male who presents with chief complaint of  Chief Complaint  Patient presents with  . New Patient (Initial Visit)  .  History of Present Illness:   The patient presents to the office for evaluation of an abdominal aortic aneurysm. The aneurysm was found incidentally and is followed by ultrasound. Patient denies abdominal pain or unusual back pain, no other abdominal complaints.  No history of an acute onset of painful blue discoloration of the toes.     No family history of AAA.   Patient denies amaurosis fugax or TIA symptoms. There is no history of claudication or rest pain symptoms of the lower extremities.  The patient denies angina or shortness of breath.  Duplex ultrasound shows an AAA that measures 3.60 cm  Current Meds  Medication Sig  . aspirin (ASPIRIN CHILDRENS) 81 MG chewable tablet Chew 1 tablet (81 mg total) by mouth 2 (two) times daily for 30 days. Take for 4 weeks, then resume regular dose.  Marland Kitchen atorvastatin (LIPITOR) 10 MG tablet Take 1 tablet (10 mg total) by mouth at bedtime.  Marland Kitchen buPROPion (WELLBUTRIN XL) 150 MG 24 hr tablet Take 150 mg by mouth daily.  . hydrocortisone 2.5 % cream Apply 1 application topically daily as needed (rash).  Marland Kitchen ketoconazole (NIZORAL) 2 % cream Apply 1 application topically daily as needed (rash).  Marland Kitchen lamoTRIgine (LAMICTAL) 200 MG tablet Take 1 tablet (200 mg total) by mouth daily.  Marland Kitchen lisinopril (PRINIVIL,ZESTRIL) 5 MG tablet TAKE 0.5 TABLETS (2.5 MG TOTAL) BY MOUTH DAILY.  . metFORMIN (GLUCOPHAGE) 500 MG tablet TAKE 1 TABLET BY MOUTH 2 TIMES DAILY WITH A MEAL. (Patient taking differently: Take 500 mg by mouth 2 (two) times a day. )  . Multiple Vitamins-Minerals (MULTIVITAMIN ADULT PO) Take 1 tablet by mouth daily.  . Omega-3 Fatty Acids (FISH OIL) 1200 MG CAPS Take 1,200 mg by mouth daily.  Marland Kitchen oxyCODONE (OXY IR/ROXICODONE) 5 MG immediate release tablet Take 1-2 tablets (5-10 mg total)  by mouth every 4 (four) hours as needed for moderate pain or severe pain.  . pramipexole (MIRAPEX) 0.5 MG tablet Take 1 tablet (0.5 mg total) by mouth 2 (two) times daily.  . Turmeric 500 MG CAPS Take 500 mg by mouth daily.     Past Medical History:  Diagnosis Date  . Anxiety    Dr.Jessica Eulas Post . well controlled  . Arthritis    back, hands, knees  . Colon polyp 05/09/2016   TUBULAR ADENOMAS   . Depression   . Diabetes mellitus without complication ([place])    type 2  . Ectatic abdominal aorta ([place]) 04/18/2016   2.9 cm on [place] 2016, unchanged 2017  . History of hiatal hernia    20 years ago  . History of skin cancer    hand  . Hyperlipidemia   . Hypertension 2015   Dr. Sanda Klein  . Morbid obesity ([place]) 11/11/2014  . Obesity 11/11/2014  . Sleep apnea    cpap    Past Surgical History:  Procedure Laterality Date  . COLONOSCOPY WITH PROPOFOL N/A 05/09/2016   Procedure: COLONOSCOPY WITH PROPOFOL;  Surgeon: Lucilla Lame, MD;  Location: [facility] ENDOSCOPY;  Service: Endoscopy;  Laterality: N/A;  . HERNIA REPAIR     Hiatal  . JOINT REPLACEMENT     04-04-17 Dr. Gladstone Lighter   Right knee  . KNEE ARTHROSCOPY     several scopes done  . KNEE CLOSED REDUCTION  Right 05/31/2017   Procedure: CLOSED MANIPULATION RIGHT KNEE;  Surgeon: Latanya Maudlin, MD;  Location: [facility];  Service: Orthopedics;  Laterality: Right;  Femoral Block  . MEDIAL PARTIAL KNEE REPLACEMENT Left    2000s?  Marland Kitchen SKIN CANCER EXCISION  Feb 2017   removed from right hand  . 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 TOTAL KNEE ARTHROPLASTY Left 09/24/2018   Procedure: REVISION LEFT KNEE ARTHROPLASTY;  Surgeon: Paralee Cancel, MD;  Location: [facility];  Service: Orthopedics;  Laterality: Left;  70 mins    Social History Social History   Tobacco Use  . Smoking status: Never Smoker  . Smokeless tobacco:  Never Used  Substance Use Topics  . Alcohol use: No    Alcohol/week: 0.0 standard drinks  . Drug use: No    Family History Family History  Problem Relation Age of Onset  . Alcohol abuse Mother   . Rheum arthritis Mother   . Stroke Maternal Grandmother   . Stroke Paternal Grandfather   . Dementia Paternal Grandmother   . Cancer Neg Hx   . Diabetes Neg Hx   . Heart disease Neg Hx   . Hypertension Neg Hx   . Kidney disease Neg Hx   . Prostate cancer Neg Hx   No family history of bleeding/clotting disorders, porphyria or autoimmune disease   No Known Allergies   REVIEW OF SYSTEMS (Negative unless checked)  Constitutional: [] Weight loss  [] Fever  [] Chills Cardiac: [] Chest pain   [] Chest pressure   [] Palpitations   [] Shortness of breath when laying flat   [] Shortness of breath with exertion. Vascular:  [] Pain in legs with walking   [] Pain in legs at rest  [] History of DVT   [] Phlebitis   [] Swelling in legs   [] Varicose veins   [] Non-healing ulcers Pulmonary:   [] Uses home oxygen   [] Productive cough   [] Hemoptysis   [] Wheeze  [] COPD   [] Asthma Neurologic:  [] Dizziness   [] Seizures   [] History of stroke   [] History of TIA  [] Aphasia   [] Vissual changes   [] Weakness or numbness in arm   [] Weakness or numbness in leg Musculoskeletal:   [x] Joint swelling   [x] Joint pain   [x] Low back pain Hematologic:  [] Easy bruising  [] Easy bleeding   [] Hypercoagulable state   [] Anemic Gastrointestinal:  [] Diarrhea   [] Vomiting  [] Gastroesophageal reflux/heartburn   [] Difficulty swallowing. Genitourinary:  [] Chronic kidney disease   [] Difficult urination  [] Frequent urination   [] Blood in urine Skin:  [] Rashes   [] Ulcers  Psychological:  [] History of anxiety   []  History of major depression.  Physical Examination  Vitals:   10/14/18 1437  BP: (!) 165/82  Pulse: 98  Resp: 14  Weight: 254 lb (115.2 kg)  Height: 5\' 8"  (1.727 m)   Body mass index is 38.62 kg/m. Gen: WD/WN, NAD Head: [place]/AT,  No temporalis wasting.  Ear/Nose/Throat: Hearing grossly intact, nares w/o erythema or drainage Eyes: PER, EOMI, sclera nonicteric.  Neck: Supple, no large masses.   Pulmonary:  Good air movement, no audible wheezing bilaterally, no use of accessory muscles.  Cardiac: RRR, no JVD Vascular:  Vessel Right Left  Radial Palpable Palpable  PT Palpable Palpable  DP Palpable Palpable  Gastrointestinal: Non-distended. No guarding/no peritoneal signs.  Musculoskeletal: M/S 5/5 throughout.  Arthritic deformity of both knees, incision left knee healing well.  Neurologic: CN 2-12 intact.  Symmetrical.  Speech is fluent. Motor exam as listed above. Psychiatric: Judgment intact, Mood & affect appropriate for pt's clinical situation. Dermatologic: No rashes or ulcers noted.  No changes consistent with cellulitis. Lymph : No lichenification or skin changes of chronic lymphedema.  CBC Lab Results  Component Value Date   WBC 8.7 09/25/2018   HGB 11.6 (L) 09/25/2018   HCT 37.0 (L) 09/25/2018   MCV 100.5 (H) 09/25/2018   PLT 219 09/25/2018    BMET    Component Value Date/Time   NA 136 09/25/2018 0351   NA 139 05/12/2015 1537   K 4.3 09/25/2018 0351   CL 105 09/25/2018 0351   CO2 20 (L) 09/25/2018 0351   GLUCOSE 147 (H) 09/25/2018 0351   BUN 14 09/25/2018 0351   BUN 15 05/12/2015 1537   CREATININE 0.90 09/25/2018 0351   CREATININE 0.94 05/30/2018 1445   CALCIUM 8.1 (L) 09/25/2018 0351   GFRNONAA >60 09/25/2018 0351   GFRNONAA 83 05/30/2018 1445   GFRAA >60 09/25/2018 0351   GFRAA 96 05/30/2018 1445   Estimated Creatinine Clearance: 96.8 mL/min (by C-G formula based on SCr of 0.9 mg/dL).  COAG Lab Results  Component Value Date   INR 0.98 03/29/2017    Radiology No results found.   Assessment/Plan 1. Abdominal aortic aneurysm (AAA) without rupture ([place]) No surgery or intervention at this time. The patient has an asymptomatic abdominal aortic aneurysm that is less than 4 cm in  maximal diameter.  I have discussed the natural history of abdominal aortic aneurysm and the small risk of rupture for aneurysm less than 5 cm in size.  However, as these small aneurysms tend to enlarge over time, continued surveillance with ultrasound or CT scan is mandatory.  I have also discussed optimizing medical management with hypertension and lipid control and the importance of abstinence from tobacco.  The patient is also encouraged to exercise a minimum of 30 minutes 4 times a week.  Should the patient develop new onset abdominal or back pain or signs of peripheral embolization they are instructed to seek medical attention immediately and to alert the physician providing care that they have an aneurysm.  The patient voices their understanding. The patient will return in 12 months with an aortic duplex.  - VAS US AORTA/IVC/ILIACS; Future  2. Benign hypertension Continue antihypertensive medications as already ordered, these medications have been reviewed and there are no changes at this time.   3. Diabetes mellitus type 2 in obese [facility]) Continue hypoglycemic medications as already ordered, these medications have been reviewed and there are no changes at this time.  Hgb A1C to be monitored as already arranged by primary service   4. Hypertriglyceridemia Continue statin as ordered and reviewed, no changes at this time     Hortencia Pilar, MD  10/14/2018 2:40 PM

## 2018-10-15 ENCOUNTER — Telehealth: Payer: Self-pay

## 2018-10-15 NOTE — Telephone Encounter (Signed)
Copied from [place] [id number]. Topic: General - Inquiry >> Oct 14, 2018  3:18 PM Alanda Slim E wrote: Reason for CRM: Dr. Delana Meyer from [facility] called to inquire about an ultra sound that the Pt advised he had a couple of months ago. Dr Delana Meyer is not able to see this information on Epic and would like Dr. Sanda Klein to call or chat him so that he can verify Ultrasound./ please advise asap

## 2018-10-15 NOTE — Telephone Encounter (Signed)
Will message Dr. Delana Meyer to clarify.

## 2018-10-17 DIAGNOSIS — Z96652 Presence of left artificial knee joint: Secondary | ICD-10-CM | POA: Diagnosis not present

## 2018-10-21 DIAGNOSIS — Z96652 Presence of left artificial knee joint: Secondary | ICD-10-CM | POA: Diagnosis not present

## 2018-10-23 ENCOUNTER — Encounter: Payer: Self-pay | Admitting: Nurse Practitioner

## 2018-10-23 ENCOUNTER — Telehealth: Payer: Self-pay | Admitting: Family Medicine

## 2018-10-23 ENCOUNTER — Ambulatory Visit (INDEPENDENT_AMBULATORY_CARE_PROVIDER_SITE_OTHER): Payer: Medicare HMO | Admitting: Nurse Practitioner

## 2018-10-23 ENCOUNTER — Other Ambulatory Visit: Payer: Self-pay

## 2018-10-23 VITALS — BP 124/70 | HR 99 | Temp 98.6°F | Resp 16 | Ht 68.0 in | Wt 253.0 lb

## 2018-10-23 DIAGNOSIS — R29818 Other symptoms and signs involving the nervous system: Secondary | ICD-10-CM | POA: Diagnosis not present

## 2018-10-23 DIAGNOSIS — E785 Hyperlipidemia, unspecified: Secondary | ICD-10-CM

## 2018-10-23 DIAGNOSIS — R2689 Other abnormalities of gait and mobility: Secondary | ICD-10-CM | POA: Diagnosis not present

## 2018-10-23 DIAGNOSIS — G4733 Obstructive sleep apnea (adult) (pediatric): Secondary | ICD-10-CM | POA: Diagnosis not present

## 2018-10-23 DIAGNOSIS — R202 Paresthesia of skin: Secondary | ICD-10-CM | POA: Diagnosis not present

## 2018-10-23 DIAGNOSIS — L600 Ingrowing nail: Secondary | ICD-10-CM

## 2018-10-23 MED ORDER — MECLIZINE HCL 12.5 MG PO TABS: 12.5000 mg | ORAL_TABLET | Freq: Two times a day (BID) | ORAL | 0 refills | Status: DC | PRN

## 2018-10-23 NOTE — Progress Notes (Signed)
Name: Duane Hughes   MRN: [id number]    DOB: 1950/07/22   Date:10/23/2018       Progress Note  Subjective  Chief Complaint  Chief Complaint  Patient presents with  . Balance Issues    HPI  Patient states for over a year has felt uneasy on his legs and off balanced. Thought it was related to weakness but has been working on Editor, commissioning for his left knee as he has had TKR last month and has not had any improvements. States last year had a few falls last year due to balance issues but he has been much more conscious of what he is doing and has not had any falls since then. States when he closes his eyes he feels very off balance and has to hold on to something. Feeling of off balance is constant and has progressively gotten worse. Not acutely worsened with position changes. Denies tinnitus, headaches, blurry vision, palpitations, nausea, lightheadedness, dizziness, chest pain. He is hard of hearing. Has had paresthesias of left leg for 2- 3 years- attributed to diabetes- states it is mild and unchanged. Also notes he would like to see a podiatrist for ingrown toe nails.   Well controlled blood pressures, and stable lipids. Takes lipitor 10mg  daily; LDL 71.  BP Readings from Last 3 Encounters:  10/23/18 124/70  10/14/18 (!) 165/82  09/25/18 124/88   Lab Results  Component Value Date   CHOL 132 05/30/2018   HDL 37 (L) 05/30/2018   LDLCALC 71 05/30/2018   TRIG 159 (H) 05/30/2018   CHOLHDL 3.6 05/30/2018   PHQ2/9: Depression screen [facility] 2/9 09/10/2018 07/15/2018 05/30/2018 04/18/2018 11/27/2017  Decreased Interest 0 0 0 0 -  Down, Depressed, Hopeless 0 0 0 0 3  PHQ - 2 Score 0 0 0 0 3  Altered sleeping 0 0 0 0 1  Tired, decreased energy 0 0 0 0 1  Change in appetite 0 0 0 0 0  Feeling bad or failure about yourself  0 0 0 0 1  Trouble concentrating 0 0 0 0 0  Moving slowly or fidgety/restless 0 0 0 0 0  Suicidal thoughts 0 0 0 0 0  PHQ-9 Score 0 0 0 0 6  Difficult doing  work/chores Not difficult at all Not difficult at all Not difficult at all Not difficult at all Not difficult at all  Some recent data might be hidden     PHQ reviewed. Negative  Patient Active Problem List   Diagnosis Date Noted  . S/P left TKA 09/24/2018  . Status post total left knee replacement 09/24/2018  . Abdominal aortic aneurysm ([place]) 09/24/2018  . Arthrofibrosis of knee joint, right 05/31/2017  . Hx of total knee arthroplasty, right 04/04/2017  . LAE (left atrial enlargement) 03/08/2017  . Right atrial enlargement 03/08/2017  . Right hip pain 12/04/2016  . Vitamin D deficiency 11/21/2016  . Physical symptoms without known medical cause 11/21/2016  . Hx of hypogonadism 11/17/2016  . Lymphadenopathy 06/20/2016  . Lymphadenopathy, axillary 06/06/2016  . Mass of right axilla 05/22/2016  . Benign neoplasm of ascending colon   . Bilateral low back pain with bilateral sciatica 03/26/2016  . 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 Erectile dysfunction of organic origin 09/05/2015  . Dyslipidemia 06/22/2015  . Bruxism (teeth grinding) 04/15/2015  . Diabetes mellitus type 2 in obese ([place]) 03/19/2015  . Medication monitoring encounter 03/19/2015  . Knee pain, bilateral 03/18/2015  . Obstructive sleep  apnea 03/18/2015  . Controlled substance agreement signed 03/18/2015  . Preventative health care 12/19/2014  . Special screening for malignant neoplasms, colon 12/19/2014  . Colon cancer screening 12/17/2014  . Hypertriglyceridemia 11/17/2014  . Drug-induced erectile dysfunction 11/13/2014  . Morbid obesity ([place]) 11/11/2014  . Major depressive disorder, recurrent ([place]) 11/11/2014  . Benign hypertension 11/11/2014  . Enlarged prostate 11/11/2014  . Skin exam, screening for cancer 11/11/2014    Past Medical History:  Diagnosis Date  . Anxiety    Dr.Jessica Eulas Post . well controlled  . Arthritis    back, hands, knees  . Colon polyp 05/09/2016    TUBULAR ADENOMAS   . Depression   . Diabetes mellitus without complication ([place])    type 2  . Ectatic abdominal aorta ([place]) 04/18/2016   2.9 cm on [place] 2016, unchanged 2017  . History of hiatal hernia    20 years ago  . History of skin cancer    hand  . Hyperlipidemia   . Hypertension 2015   Dr. Sanda Klein  . Morbid obesity ([place]) 11/11/2014  . Obesity 11/11/2014  . Sleep apnea    cpap    Past Surgical History:  Procedure Laterality Date  . COLONOSCOPY WITH PROPOFOL N/A 05/09/2016   Procedure: COLONOSCOPY WITH PROPOFOL;  Surgeon: Lucilla Lame, MD;  Location: [facility] END[facility];  Service: Endoscopy;  Laterality: N/A;  . HERNIA REPAIR     Hiatal  . JOINT REPLACEMENT     04-04-17 Dr. Gladstone Lighter   Right knee  . KNEE ARTHROSCOPY     several scopes done  . KNEE CLOSED REDUCTION Right 05/31/2017   Procedure: CLOSED MANIPULATION RIGHT KNEE;  Surgeon: Latanya Maudlin, MD;  Location: [facility];  Service: Orthopedics;  Laterality: Right;  Femoral Block  . MEDIAL PARTIAL KNEE REPLACEMENT Left    2000s?  Marland Kitchen SKIN CANCER EXCISION  Feb 2017   removed from right hand  . 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 TOTAL KNEE ARTHROPLASTY Left 09/24/2018   Procedure: REVISION LEFT KNEE ARTHROPLASTY;  Surgeon: Paralee Cancel, MD;  Location[facility];  Service: Orthopedics;  Laterality: Left;  70 mins    Social History   Tobacco Use  . Smoking status: Never Smoker  . Smokeless tobacco: Never Used  Substance Use Topics  . Alcohol use: No    Alcohol/week: 0.0 standard drinks     Current Outpatient Medications:  .  aspirin (ASPIRIN CHILDRENS) 81 MG chewable tablet, Chew 1 tablet (81 mg total) by mouth 2 (two) times daily for 30 days. Take for 4 weeks, then resume regular dose., Disp: 60 tablet, Rfl: 0 .  atorvastatin (LIPITOR) 10 MG tablet, Take 1 tablet (10 mg total) by mouth at  bedtime., Disp: 90 tablet, Rfl: 3 .  buPROPion (WELLBUTRIN XL) 150 MG 24 hr tablet, Take 150 mg by mouth daily., Disp: , Rfl:  .  hydrocortisone 2.5 % cream, Apply 1 application topically daily as needed (rash)., Disp: , Rfl:  .  ketoconazole (NIZORAL) 2 % cream, Apply 1 application topically daily as needed (rash)., Disp: , Rfl:  .  lamoTRIgine (LAMICTAL) 200 MG tablet, Take 1 tablet (200 mg total) by mouth daily., Disp: 90 tablet, Rfl: 2 .  lisinopril (PRINIVIL,ZESTRIL) 5 MG tablet, TAKE 0.5 TABLETS (2.5 MG TOTAL) BY MOUTH DAILY., Disp: 45 tablet, Rfl: 1 .  metFORMIN (GLUCOPHAGE) 500 MG tablet, TAKE 1  TABLET BY MOUTH 2 TIMES DAILY WITH A MEAL. (Patient taking differently: Take 500 mg by mouth 2 (two) times a day. ), Disp: 60 tablet, Rfl: 5 .  Multiple Vitamins-Minerals (MULTIVITAMIN ADULT PO), Take 1 tablet by mouth daily., Disp: , Rfl:  .  Omega-3 Fatty Acids (FISH OIL) 1200 MG CAPS, Take 1,200 mg by mouth daily., Disp: , Rfl:  .  oxyCODONE (OXY IR/ROXICODONE) 5 MG immediate release tablet, Take 1-2 tablets (5-10 mg total) by mouth every 4 (four) hours as needed for moderate pain or severe pain., Disp: 60 tablet, Rfl: 0 .  sildenafil (REVATIO) 20 MG tablet, Take 3 to 5 tablets two hours before intercouse on an empty stomach.  Do not take with nitrates., Disp: 50 tablet, Rfl: 3 .  Turmeric 500 MG CAPS, Take 500 mg by mouth daily. , Disp: , Rfl:  .  acetaminophen (TYLENOL) 500 MG tablet, Take 2 tablets (1,000 mg total) by mouth every 8 (eight) hours. (Patient not taking: Reported on 10/14/2018), Disp: 30 tablet, Rfl: 0 .  docusate sodium (COLACE) 100 MG capsule, Take 1 capsule (100 mg total) by mouth 2 (two) times daily. (Patient not taking: Reported on 10/14/2018), Disp: 10 capsule, Rfl: 0 .  ferrous sulfate (FERROUSUL) 325 (65 FE) MG tablet, Take 1 tablet (325 mg total) by mouth 3 (three) times daily with meals. (Patient not taking: Reported on 10/14/2018), Disp: , Rfl: 3 .  methocarbamol (ROBAXIN)  500 MG tablet, Take 1 tablet (500 mg total) by mouth every 6 (six) hours as needed for muscle spasms. (Patient not taking: Reported on 10/23/2018), Disp: 40 tablet, Rfl: 0 .  polyethylene glycol (MIRALAX / GLYCOLAX) 17 g packet, Take 17 g by mouth 2 (two) times daily. (Patient not taking: Reported on 10/14/2018), Disp: 14 each, Rfl: 0 .  pramipexole (MIRAPEX) 0.5 MG tablet, Take 1 tablet (0.5 mg total) by mouth 2 (two) times daily. (Patient not taking: Reported on 10/23/2018), Disp: 180 tablet, Rfl: 2 .  triamcinolone cream (KENALOG) 0.1 %, Apply 1 application topically daily as needed (rash)., Disp: , Rfl:   No Known Allergies  ROS    No other specific complaints in a complete review of systems (except as listed in HPI above).  Objective  Vitals:   10/23/18 1103  BP: 124/70  Pulse: 99  Resp: 16  Temp: 98.6 F (37 C)  TempSrc: Oral  SpO2: 96%  Weight: 253 lb (114.8 kg)  Height: 5\' 8"  (1.727 m)    Body mass index is 38.47 kg/m.  Nursing Note and Vital Signs reviewed.  Physical Exam Vitals signs reviewed.  Constitutional:      Appearance: He is well-developed.  HENT:     Head: Normocephalic and atraumatic.     Right Ear: Tympanic membrane, ear canal and external ear normal. There is no impacted cerumen.     Left Ear: Tympanic membrane, ear canal and external ear normal. There is no impacted cerumen.     Ears:     Comments: Wears bilateral hearing aids  Eyes:     General: No scleral icterus.       Right eye: No discharge.        Left eye: No discharge.     Conjunctiva/sclera: Conjunctivae normal.     Pupils: Pupils are equal, round, and reactive to light.  Neck:     Musculoskeletal: Normal range of motion and neck supple.     Vascular: No carotid bruit.  Cardiovascular:     Heart sounds:  Normal heart sounds.  Pulmonary:     Effort: Pulmonary effort is normal.     Breath sounds: Normal breath sounds.  Abdominal:     General: Bowel sounds are normal.      Palpations: Abdomen is soft.     Tenderness: There is no abdominal tenderness.  Musculoskeletal: Normal range of motion.  Skin:    General: Skin is warm and dry.     Capillary Refill: Capillary refill takes less than 2 seconds.  Neurological:     General: No focal deficit present.     Mental Status: He is alert and oriented to person, place, and time.     GCS: GCS eye subscore is 4. GCS verbal subscore is 5. GCS motor subscore is 6.     Cranial Nerves: No dysarthria or facial asymmetry.     Sensory: Sensory deficit (decreased sensation down left leg) present.     Motor: No weakness.     Coordination: Romberg sign positive. Coordination abnormal. Finger-Nose-Finger Test and Heel to [facility] Test normal.     Gait: Tandem walk abnormal. Gait (favors right leg when walking but stable gait, unable to complete heel-toe walking due to off balance) normal.     Deep Tendon Reflexes: Reflexes normal.  Psychiatric:        Speech: Speech normal.        Behavior: Behavior normal.        Thought Content: Thought content normal.        Judgment: Judgment normal.      No results found for this or any previous visit (from the past 48 hour(s)).  Assessment & Plan  1. Balance disorder Send to PT for balance training and epley maneuver if needed. Neurology for further work up of chronic progressive balance disorder. Chronic care management- social work to see if any assistance to help with medical coverage due to fixed income. Trial meclizine to see if there is any improvement- cautioned on side effects. Discussed ER precautions.  - Ambulatory referral to Neurology - Ambulatory referral to Physical Therapy - meclizine (ANTIVERT) 12.5 MG tablet; Take 1 tablet (12.5 mg total) by mouth 2 (two) times daily as needed for dizziness.  Dispense: 30 tablet; Refill: 0 - Referral to Chronic Care Management Services  2. Positive Romberg test - Ambulatory referral to Neurology  3. Paresthesia of left lower  extremity - Ambulatory referral to Neurology - Referral to Chronic Care Management Services  4. Ingrown nail of great toe of left foot - Ambulatory referral to Podiatry  5. Obstructive sleep apnea - Referral to Chronic Care Management Services  6. Dyslipidemia - Referral to Chronic Care Management Services

## 2018-10-23 NOTE — Telephone Encounter (Signed)
Pt is calling stating that he has meds on his list that he is not taking, he would like this updated.   He is not taking ferrous sulfate (FERROUSUL) 325 (65 FE) MG tablet  He said he has triamcinolone cream (KENALOG) 0.1 %  But he is not taking it at the moment  cb if needed

## 2018-10-23 NOTE — Patient Instructions (Addendum)
-   Take meclizine twice a day for 3 days- if you do not notice any improvements stop taking this medication, if you notice an improvement please let us know. Be aware this medication can make you drowsy so monitor how it makes you feel when you do not need to drive first.   We have sent referrals for you if you do not hear back within a month, please let us know.

## 2018-10-24 DIAGNOSIS — Z96652 Presence of left artificial knee joint: Secondary | ICD-10-CM | POA: Diagnosis not present

## 2018-10-25 ENCOUNTER — Ambulatory Visit: Payer: Self-pay | Admitting: *Deleted

## 2018-10-25 ENCOUNTER — Encounter: Payer: Self-pay | Admitting: Family Medicine

## 2018-10-25 DIAGNOSIS — E1169 Type 2 diabetes mellitus with other specified complication: Secondary | ICD-10-CM

## 2018-10-25 DIAGNOSIS — R2689 Other abnormalities of gait and mobility: Secondary | ICD-10-CM

## 2018-10-26 NOTE — Patient Instructions (Addendum)
Thank you allowing the Chronic Care Management Team to be a part of your care! It was a pleasure speaking with you today!   Duane Hughes was given information about Chronic Care Management services today including:  1. CCM service includes personalized support from designated clinical staff supervised by his physician, including individualized plan of care and coordination with other care providers 2. 24/7 contact phone numbers for assistance for urgent and routine care needs. 3. Service will only be billed when office clinical staff spend 20 minutes or more in a month to coordinate care. 4. Only one practitioner may furnish and bill the service in a calendar month. 5. The patient may stop CCM services at any time (effective at the end of the month) by phone call to the office staff. 6. The patient will be responsible for cost sharing (co-pay) of up to 20% of the service fee (after annual deductible is met).     CCM (Chronic Care Management) Team     Trish Fountain RN, BSN Nurse Care Coordinator  [phone]  Ruben Reason PharmD  Clinical Pharmacist  [phone]   [place], LCSW Clinical Social Worker [phone]  Goals Addressed   None      The patient verbalized understanding of instructions provided today and declined a print copy of patient instruction materials.   The care management team will reach out to the patient again over the next 14 days days.

## 2018-10-26 NOTE — Chronic Care Management (AMB) (Signed)
  Chronic Care Management    Clinical Social Work General Note  10/26/2018 Name: Duane Hughes MRN: [id number] DOB: [birth year]  Duane Hughes is a 68 y.o. year old male who is a rimary care patient of Lada, Satira Anis, MD. The CCM was consulted to assist the patient with financial difficulties.  [name] was given information about Chronic Care Management services today including:  1. CCM service includes personalized support from designated clinical staff supervised by his physician, including individualized plan of care and coordination with other care providers 2. 24/7 contact phone numbers for assistance for urgent and routine care needs. 3. Service will only be billed when office clinical staff spend 20 minutes or more in a month to coordinate care. 4. Only one practitioner may furnish and bill the service in a calendar month. 5. The patient may stop CCM services at any time (effective at the end of the month) by phone call to the office staff. 6. The patient will be responsible for cost sharing (co-pay) of up to 20% of the service fee (after annual deductible is met).  Patient agreed to services and verbal consent obtained.   Review of patient status, including review of consultants reports, relevant laboratory and other test results, and collaboration with appropriate care team members and the patient's provider was performed as part of comprehensive patient evaluation and provision of chronic care management services.    Goals Addressed   None      Follow Up Plan: Appointment scheduled for SW follow up with client by phone on: 11/07/2018       Elliot Gurney, [place] Worker  [facility] Care [facility] [phone]

## 2018-10-28 DIAGNOSIS — Z96652 Presence of left artificial knee joint: Secondary | ICD-10-CM | POA: Diagnosis not present

## 2018-10-31 DIAGNOSIS — Z96652 Presence of left artificial knee joint: Secondary | ICD-10-CM | POA: Diagnosis not present

## 2018-11-04 ENCOUNTER — Encounter: Payer: Self-pay | Admitting: Psychiatry

## 2018-11-04 ENCOUNTER — Ambulatory Visit (INDEPENDENT_AMBULATORY_CARE_PROVIDER_SITE_OTHER): Payer: Medicare HMO | Admitting: Psychiatry

## 2018-11-04 ENCOUNTER — Other Ambulatory Visit: Payer: Self-pay

## 2018-11-04 DIAGNOSIS — F32A Depression, unspecified: Secondary | ICD-10-CM

## 2018-11-04 DIAGNOSIS — R69 Illness, unspecified: Secondary | ICD-10-CM | POA: Diagnosis not present

## 2018-11-04 DIAGNOSIS — Z96652 Presence of left artificial knee joint: Secondary | ICD-10-CM | POA: Diagnosis not present

## 2018-11-04 DIAGNOSIS — F329 Major depressive disorder, single episode, unspecified: Secondary | ICD-10-CM

## 2018-11-04 MED ORDER — PRAMIPEXOLE DIHYDROCHLORIDE 0.5 MG PO TABS: 0.5000 mg | ORAL_TABLET | Freq: Two times a day (BID) | ORAL | 1 refills | Status: DC

## 2018-11-04 MED ORDER — BUPROPION HCL ER (SR) 150 MG PO TB12: 150.0000 mg | ORAL_TABLET | Freq: Two times a day (BID) | ORAL | 1 refills | Status: DC

## 2018-11-04 MED ORDER — LAMOTRIGINE 200 MG PO TABS: 200.0000 mg | ORAL_TABLET | Freq: Every day | ORAL | 1 refills | Status: DC

## 2018-11-04 NOTE — Progress Notes (Signed)
Duane Hughes [id number] 1950-07-24 68 y.o.  Virtual Visit via Telephone Note  I connected with pt on 11/04/18 at  2:45 PM EDT by telephone and verified that I am speaking with the correct person using two identifiers.   I discussed the limitations, risks, security and privacy concerns of performing an evaluation and management service by telephone and the availability of in person appointments. I also discussed with the patient that there may be a patient responsible charge related to this service. The patient expressed understanding and agreed to proceed.   I discussed the assessment and treatment plan with the patient. The patient was provided an opportunity to ask questions and all were answered. The patient agreed with the plan and demonstrated an understanding of the instructions.   The patient was advised to call back or seek an in-person evaluation if the symptoms worsen or if the condition fails to improve as anticipated.  I provided 30 minutes of non-face-to-face time during this encounter.  The patient was located at home.  The provider was located at [facility].   Thayer Headings, PMHNP   Subjective:   Patient ID:  Duane Hughes is a 68 y.o. (DOB 20-Aug-1950) male.  Chief Complaint:  Chief Complaint  Patient presents with  . Follow-up    h/o Depression    HPI Duane Hughes presents for follow-up of depression. He reports that he has been doing well overall. Reports that he had his other (left) knee replaced in 09/24/18 and reports that his recovery has been going well and he was able to return to work within less than 2 weeks. He reports that his mood has been "good." Reports that he has been more "emotional" and easily moved to tears, such as watching something sentimental on TV. Denies any recent depressed mood. Reports that his irritability has improved. Denies anxiety. Reports that he generally does not need a significant amount of sleep. Reports that  sometimes his sleep is restless and will occasionally nap if needed. He reports that he has difficulty going to bed until 11-12 midnight on work days. He reports that sleeps around 5 hours on average with nights of more or less sleep. He reports that he was gaining weight prior to his surgery and has lost some weight since his surgery. He reports that his energy and motivation have been good. Reports that he has been walking in the morning and trying to be active in the evenings. Reports adequate concentration.   Denies any recent SI.   Has been able to practice putting and chipping. Reports that he has been working less hours since the pandemic.  He reports that he has been rarely taking oxycodone.   Past Psychiatric Medication Trials: Pristiq-ineffective Effexor-effective for 2 years then no longer effective Bupropion-taken for years Cymbalta-helped for 2 years but no longer effective Abilify-not effective at 5 mg and increased emotional lability at 10 mg Lamictal Lithium Pramipexole- effective for mood signs and symptoms  Review of Systems:  Review of Systems  Musculoskeletal:       Recovering well from recent knee replacement. Reports improved gait.   Neurological: Negative for dizziness and tremors.       Reports neurology eval later this month. Continues to have some difficulty with balance and is going to start PT for balance this week. Reports periods of RLS and will have RLS several nights and then will not have it for several nights. Has not been able to identify triggers or patterns to  RLS.   Psychiatric/Behavioral:       Please refer to HPI    Medications: I have reviewed the patient's current medications.  Current Outpatient Medications  Medication Sig Dispense Refill  . atorvastatin (LIPITOR) 10 MG tablet Take 1 tablet (10 mg total) by mouth at bedtime. 90 tablet 3  . buPROPion (WELLBUTRIN SR) 150 MG 12 hr tablet Take 1 tablet (150 mg total) by mouth 2 (two) times  daily. 180 tablet 1  . hydrocortisone 2.5 % cream Apply 1 application topically daily as needed (rash).    Marland Kitchen ketoconazole (NIZORAL) 2 % cream Apply 1 application topically daily as needed (rash).    Marland Kitchen lisinopril (PRINIVIL,ZESTRIL) 5 MG tablet TAKE 0.5 TABLETS (2.5 MG TOTAL) BY MOUTH DAILY. 45 tablet 1  . metFORMIN (GLUCOPHAGE) 500 MG tablet TAKE 1 TABLET BY MOUTH 2 TIMES DAILY WITH A MEAL. (Patient taking differently: Take 500 mg by mouth 2 (two) times a day. ) 60 tablet 5  . Multiple Vitamins-Minerals (MULTIVITAMIN ADULT PO) Take 1 tablet by mouth daily.    . Omega-3 Fatty Acids (FISH OIL) 1200 MG CAPS Take 1,200 mg by mouth daily.    Marland Kitchen oxyCODONE (OXY IR/ROXICODONE) 5 MG immediate release tablet Take 1-2 tablets (5-10 mg total) by mouth every 4 (four) hours as needed for moderate pain or severe pain. 60 tablet 0  . pramipexole (MIRAPEX) 0.5 MG tablet Take 1 tablet (0.5 mg total) by mouth 2 (two) times a day. 180 tablet 1  . triamcinolone cream (KENALOG) 0.1 % Apply 1 application topically daily as needed (rash).    . Turmeric 500 MG CAPS Take 500 mg by mouth daily.     . ferrous sulfate (FERROUSUL) 325 (65 FE) MG tablet Take 1 tablet (325 mg total) by mouth 3 (three) times daily with meals. (Patient not taking: Reported on 10/14/2018)  3  . lamoTRIgine (LAMICTAL) 200 MG tablet Take 1 tablet (200 mg total) by mouth daily. 90 tablet 1  . meclizine (ANTIVERT) 12.5 MG tablet Take 1 tablet (12.5 mg total) by mouth 2 (two) times daily as needed for dizziness. (Patient not taking: Reported on 11/04/2018) 30 tablet 0  . sildenafil (REVATIO) 20 MG tablet Take 3 to 5 tablets two hours before intercouse on an empty stomach.  Do not take with nitrates. 50 tablet 3   No current facility-administered medications for this visit.     Medication Side Effects: None  Allergies: No Known Allergies  Past Medical History:  Diagnosis Date  . Anxiety    Dr.Mavryk Pino E[facility] . well controlled  . Arthritis    back,  hands, knees  . Colon polyp 05/09/2016   TUBULAR ADENOMAS   . Depression   . Diabetes mellitus without complication ([place])    type 2  . Ectatic abdominal aorta ([place]) 04/18/2016   2.9 cm on [place] 2016, unchanged 2017  . History of hiatal hernia    20 years ago  . History of skin cancer    hand  . Hyperlipidemia   . Hypertension 2015   Dr. Sanda Klein  . Morbid obesity ([place]) 11/11/2014  . Obesity 11/11/2014  . Sleep apnea    cpap    Family History  Problem Relation Age of Onset  . Alcohol abuse Mother   . Rheum arthritis Mother   . Stroke Maternal Grandmother   . Stroke Paternal Grandfather   . Dementia Paternal Grandmother   . Cancer Neg Hx   . Diabetes Neg Hx   . Heart  disease Neg Hx   . Hypertension Neg Hx   . Kidney disease Neg Hx   . Prostate cancer Neg Hx     Social History   Socioeconomic History  . Marital status: Married    Spouse name: Not on file  . Number of children: 6  . Years of education: Not on file  . Highest education level: Associate degree: academic program  Occupational History    Comment: part time  Social Needs  . Financial resource strain: Not hard at all  . Food insecurity    Worry: Never true    Inability: Never true  . Transportation needs    Medical: No    Non-medical: No  Tobacco Use  . Smoking status: Never Smoker  . Smokeless tobacco: Never Used  Substance and Sexual Activity  . Alcohol use: No    Alcohol/week: 0.0 standard drinks  . Drug use: No  . Sexual activity: Yes    Partners: Female  Lifestyle  . Physical activity    Days per week: 3 days    Minutes per session: 60 min  . Stress: Not at all  Relationships  . Social connections    Talks on phone: More than three times a week    Gets together: Three times a week    Attends religious service: More than 4 times per year    Active member of club or organization: Yes    Attends meetings of clubs or organizations: 1 to 4 times per year    Relationship status: Married  .  Intimate partner violence    Fear of current or ex partner: No    Emotionally abused: No    Physically abused: No    Forced sexual activity: No  Other Topics Concern  . Not on file  Social History Narrative  . Not on file    Past Medical History, Surgical history, Social history, and Family history were reviewed and updated as appropriate.   Please see review of systems for further details on the patient's review from today.   Objective:   Physical Exam:  There were no vitals taken for this visit.  Physical Exam Neurological:     Mental Status: He is alert and oriented to person, place, and time.     Cranial Nerves: No dysarthria.  Psychiatric:        Attention and Perception: Attention normal.        Mood and Affect: Mood normal.        Speech: Speech normal.        Behavior: Behavior is cooperative.        Thought Content: Thought content normal. Thought content is not paranoid or delusional. Thought content does not include homicidal or suicidal ideation. Thought content does not include homicidal or suicidal plan.        Cognition and Memory: Cognition and memory normal.        Judgment: Judgment normal.     Lab Review:     Component Value Date/Time   NA 136 09/25/2018 0351   NA 139 05/12/2015 1537   K 4.3 09/25/2018 0351   CL 105 09/25/2018 0351   CO2 20 (L) 09/25/2018 0351   GLUCOSE 147 (H) 09/25/2018 0351   BUN 14 09/25/2018 0351   BUN 15 05/12/2015 1537   CREATININE 0.90 09/25/2018 0351   CREATININE 0.94 05/30/2018 1445   CALCIUM 8.1 (L) 09/25/2018 0351   PROT 5.9 (L) 05/30/2018 1445   PROT 6.2 03/19/2015 0825  ALBUMIN 4.1 09/19/2016 1400   ALBUMIN 4.0 03/19/2015 0825   AST 44 (H) 05/30/2018 1445   ALT 35 05/30/2018 1445   ALKPHOS 46 09/19/2016 1400   BILITOT 0.4 05/30/2018 1445   BILITOT 0.5 03/19/2015 0825   GFRNONAA >60 09/25/2018 0351   GFRNONAA 83 05/30/2018 1445   GFRAA >60 09/25/2018 0351   GFRAA 96 05/30/2018 1445       Component Value  Date/Time   WBC 8.7 09/25/2018 0351   RBC 3.68 (L) 09/25/2018 0351   HGB 11.6 (L) 09/25/2018 0351   HGB 14.8 06/20/2016 1458   HCT 37.0 (L) 09/25/2018 0351   HCT 43.6 06/20/2016 1458   PLT 219 09/25/2018 0351   PLT 260 06/20/2016 1458   MCV 100.5 (H) 09/25/2018 0351   MCV 93 06/20/2016 1458   MCH 31.5 09/25/2018 0351   MCHC 31.4 09/25/2018 0351   RDW 12.3 09/25/2018 0351   RDW 13.3 06/20/2016 1458   LYMPHSABS 1,318 11/27/2017 1419   LYMPHSABS 2.0 06/20/2016 1458   MONOABS 0.4 03/29/2017 1343   EOSABS 396 11/27/2017 1419   EOSABS 0.4 06/20/2016 1458   BASOSABS 58 11/27/2017 1419   BASOSABS 0.1 06/20/2016 1458    No results found for: POCLITH, LITHIUM   No results found for: PHENYTOIN, PHENOBARB, VALPROATE, CBMZ   .res Assessment: Plan:   Will continue current plan of care since mood s/s are well controlled without significant tolerability issues.  Pt to f/u in 6 months or sooner if clinically indicated.   Patient advised to contact office with any questions, adverse effects, or acute worsening in signs and symptoms.  Duane Hughes was seen today for follow-up.  Diagnoses and all orders for this visit:  Chronic depressive disorder Comments: Chronic, stable Orders: -     buPROPion (WELLBUTRIN SR) 150 MG 12 hr tablet; Take 1 tablet (150 mg total) by mouth 2 (two) times daily. -     pramipexole (MIRAPEX) 0.5 MG tablet; Take 1 tablet (0.5 mg total) by mouth 2 (two) times a day. -     lamoTRIgine (LAMICTAL) 200 MG tablet; Take 1 tablet (200 mg total) by mouth daily.    Please see After Visit Summary for patient specific instructions.  Future Appointments  Date Time Provider [place]  11/07/2018  1:30 PM [facility] SOCIAL WORK [facility]  11/12/2018  9:30 AM Edrick Kins, DPM [facility]  11/29/2018  1:20 PM Fredderick Severance, NP [facility]  03/03/2019  3:30 PM [facility] None  03/06/2019  3:45 PM McGowan, Larene Beach A, PA-C [facility] None  10/16/2019   9:30 AM [facility] VASC 3 [facility] None  10/16/2019 10:30 AM Schnier, Dolores Lory, MD [facility] None    No orders of the defined types were placed in this encounter.     -------------------------------

## 2018-11-07 ENCOUNTER — Telehealth: Payer: Self-pay

## 2018-11-07 ENCOUNTER — Ambulatory Visit: Payer: Self-pay | Admitting: *Deleted

## 2018-11-07 DIAGNOSIS — M6281 Muscle weakness (generalized): Secondary | ICD-10-CM | POA: Diagnosis not present

## 2018-11-07 DIAGNOSIS — R2689 Other abnormalities of gait and mobility: Secondary | ICD-10-CM | POA: Diagnosis not present

## 2018-11-07 NOTE — Chronic Care Management (AMB) (Signed)
   Chronic Care Management   Unsuccessful Call Note 11/07/2018 Name: Duane Hughes MRN: [id number] DOB: [birth year]  Patient  is a 68 year old male who sees Suezanne Cheshire, NP  for primary care. Suezanne Cheshire, NP asked the CCM team to consult the patient for resources related to medical bills.     This social worker was unable to reach patient via telephone today for the initial assessment. I have left HIPAA compliant voicemail asking patient to return my call. (unsuccessful outreach #1).   Plan: Will follow-up within 7 business days via telephone.     Elliot Gurney, [place] Administrator, [facility] [phone]

## 2018-11-08 DIAGNOSIS — Z471 Aftercare following joint replacement surgery: Secondary | ICD-10-CM | POA: Diagnosis not present

## 2018-11-08 DIAGNOSIS — Z96652 Presence of left artificial knee joint: Secondary | ICD-10-CM | POA: Diagnosis not present

## 2018-11-11 ENCOUNTER — Ambulatory Visit: Payer: Self-pay | Admitting: *Deleted

## 2018-11-11 ENCOUNTER — Encounter: Payer: Self-pay | Admitting: *Deleted

## 2018-11-11 DIAGNOSIS — Z96651 Presence of right artificial knee joint: Secondary | ICD-10-CM

## 2018-11-11 DIAGNOSIS — R2689 Other abnormalities of gait and mobility: Secondary | ICD-10-CM

## 2018-11-11 NOTE — Chronic Care Management (AMB) (Signed)
  Chronic Care Management    Clinical Social Work General Note  11/11/2018 Name: Duane Hughes MRN: [id number] DOB: 07-05-1950  Duane Hughes is a 68 y.o. year old male who is a primary care patient of Lada, Satira Anis, MD. The CCM was consulted to assist the patient with financial resources related to medical bills.  Per patient, he is on a fixed income and has difficulty paying his co-pays for physical therapy. Patient did confirm today that he has made an arrangement where he is billed monthly for PT services as opposed to by session, which has worked out well.  Patient currently  followed by [facility] for medication management.  SDOH (Social Determinants of Health) screening performed today. See Care Plan Entry related to challenges with: None  Goals Addressed   None      Follow Up Plan: Client will contact this social worker with any additional community resource needs       Elliot Gurney, [place] Worker  [facility] [phone]

## 2018-11-12 ENCOUNTER — Ambulatory Visit: Payer: Medicare HMO | Admitting: Podiatry

## 2018-11-12 ENCOUNTER — Encounter: Payer: Self-pay | Admitting: Podiatry

## 2018-11-12 ENCOUNTER — Other Ambulatory Visit: Payer: Self-pay

## 2018-11-12 VITALS — BP 142/78 | HR 87 | Temp 97.3°F

## 2018-11-12 DIAGNOSIS — M79676 Pain in unspecified toe(s): Secondary | ICD-10-CM | POA: Diagnosis not present

## 2018-11-12 DIAGNOSIS — R2689 Other abnormalities of gait and mobility: Secondary | ICD-10-CM | POA: Diagnosis not present

## 2018-11-12 DIAGNOSIS — L600 Ingrowing nail: Secondary | ICD-10-CM

## 2018-11-12 DIAGNOSIS — B351 Tinea unguium: Secondary | ICD-10-CM

## 2018-11-12 DIAGNOSIS — M6281 Muscle weakness (generalized): Secondary | ICD-10-CM | POA: Diagnosis not present

## 2018-11-12 MED ORDER — GENTAMICIN SULFATE 0.1 % EX CREA: 1.0000 "application " | TOPICAL_CREAM | Freq: Two times a day (BID) | CUTANEOUS | 1 refills | Status: DC

## 2018-11-12 NOTE — Progress Notes (Signed)
   SUBJECTIVE Patient presents to office today complaining of elongated, thickened nails that cause pain while ambulating in shoes. He is unable to trim his own nails.  He also reports intermittent pain to the lateral border of the right great toe that began several years ago. Wearing shoes increases the pain. He has not done anything for treatment. Patient is here for further evaluation and treatment.  Past Medical History:  Diagnosis Date  . Anxiety    Dr.Jessica Eulas [facility] . well controlled  . Arthritis    back, hands, knees  . Colon polyp 05/09/2016   TUBULAR ADENOMAS   . Depression   . Diabetes mellitus without complication ([place])    type 2  . Ectatic abdominal aorta ([place]) 04/18/2016   2.9 cm on [place] 2016, unchanged 2017  . History of hiatal hernia    20 years ago  . History of skin cancer    hand  . Hyperlipidemia   . Hypertension 2015   Dr. Sanda Klein  . Morbid obesity ([place]) 11/11/2014  . Obesity 11/11/2014  . Sleep apnea    cpap    OBJECTIVE General Patient is awake, alert, and oriented x 3 and in no acute distress. Derm Skin is dry and supple bilateral. Negative open lesions or macerations. Remaining integument unremarkable. Nails are tender, long, thickened and dystrophic with subungual debris, consistent with onychomycosis, 1-5 bilateral. Lateral border of the right hallux appears to be erythematous with evidence of an ingrowing nail. Pain on palpation noted to the border of the nail fold.  Vasc  DP and PT pedal pulses palpable bilaterally. Temperature gradient within normal limits.  Neuro Epicritic and protective threshold sensation grossly intact bilaterally.  Musculoskeletal Exam No symptomatic pedal deformities noted bilateral. Muscular strength within normal limits.  ASSESSMENT 1. Onychodystrophic nails 1-5 bilateral with hyperkeratosis of nails.  2. Onychomycosis of nail due to dermatophyte bilateral 3. Paronychia with ingrowing nail lateral border right hallux  4. Pain in  toe 5. Incurvated nail  PLAN OF CARE 1. Patient evaluated today.  2. Discussed treatment alternatives and plan of care. Explained nail avulsion procedure and post procedure course to patient. 3. Patient opted for permanent partial nail avulsion of the lateral border right hallux.  4. Prior to procedure, local anesthesia infiltration utilized using 3 ml of a 50:50 mixture of 2% plain lidocaine and 0.5% plain marcaine in a normal hallux block fashion and a betadine prep performed.  5. Partial permanent nail avulsion with chemical matrixectomy performed using [id number] applications of phenol followed by alcohol flush.  6. Light dressing applied. 7. Instructed to maintain good pedal hygiene and foot care.  8. Mechanical debridement of nails 1-5 bilaterally performed using a nail nipper. Filed with dremel without incident.  9. Prescription for Gentamicin cream provided to patient to use daily with a bandage.  10. Return to clinic in 3 weeks.    Edrick Kins, DPM [facility]  Dr. Edrick Kins, [place]                                        [place], [place] [address]                Office [phone]  Fax [phone]

## 2018-11-14 ENCOUNTER — Ambulatory Visit: Payer: Self-pay | Admitting: *Deleted

## 2018-11-14 ENCOUNTER — Telehealth: Payer: Self-pay | Admitting: *Deleted

## 2018-11-14 DIAGNOSIS — R2689 Other abnormalities of gait and mobility: Secondary | ICD-10-CM

## 2018-11-14 DIAGNOSIS — M6281 Muscle weakness (generalized): Secondary | ICD-10-CM | POA: Diagnosis not present

## 2018-11-14 DIAGNOSIS — Z96651 Presence of right artificial knee joint: Secondary | ICD-10-CM

## 2018-11-14 NOTE — Chronic Care Management (AMB) (Signed)
   Chronic Care Management   Unsuccessful Call Note 11/14/2018 Name: Duane Hughes MRN: [id number] DOB: 01-30-1951  Patient  is a 68 year old male who sees Suezanne Cheshire, NP for primary care. Suezanne Cheshire, NP sked the CCM team to consult the patient for community resources related to affordability of co-pays.     This social worker was unable to reach patient via telephone today for follow up. I have left HIPAA compliant voicemail asking patient to return my call. (unsuccessful outreach #1).   Plan: Will follow-up within 7 business days via telephone.     Elliot Gurney, [place] Administrator, [facility] Management [phone]

## 2018-11-15 NOTE — Chronic Care Management (AMB) (Signed)
  Chronic Care Management   Social Work Note  11/15/2018 Name: Duane Hughes MRN: [id number] DOB: [birth year]  Duane Hughes is a 68 y.o. year old male who sees Lada, Satira Anis, MD for primary care. The CCM team was consulted for assistance with [employer].  Return phone call from patient to follow up on his financial needs to assist with co-pays. This social worker was unable to identify any additional resources to assist with patient's co-pays for PT. Per patient, he has seen increased improvement in his balance in only 2 sessions and plans to continue. Per patient, he will continue with the plan to be billed monthly for the PT services.   Goals Addressed   None     Follow Up Plan: Client will continue with the PT sessions for his balance and has arranged to be billed montly instead of per session  [place], [place] Worker  [facility] [phone]

## 2018-11-15 NOTE — Patient Instructions (Signed)
Thank you allowing the Chronic Care Management Team to be a part of your care! It was a pleasure speaking with you today!    CCM (Chronic Care Management) Team   Trish Fountain RN, BSN Nurse Care Coordinator  [phone]  Ruben Reason PharmD  Clinical Pharmacist  [phone]   [place], LCSW Clinical Social Worker [phone]  Goals Addressed   None      The patient verbalized understanding of instructions provided today and declined a print copy of patient instruction materials.   No further follow up required: Patient has made an arrangement with PT to be billed monthly for his sessions rather that by session

## 2018-11-21 ENCOUNTER — Telehealth: Payer: Self-pay | Admitting: *Deleted

## 2018-11-25 DIAGNOSIS — R7309 Other abnormal glucose: Secondary | ICD-10-CM | POA: Diagnosis not present

## 2018-11-25 DIAGNOSIS — R200 Anesthesia of skin: Secondary | ICD-10-CM | POA: Diagnosis not present

## 2018-11-25 DIAGNOSIS — E531 Pyridoxine deficiency: Secondary | ICD-10-CM | POA: Diagnosis not present

## 2018-11-25 DIAGNOSIS — E119 Type 2 diabetes mellitus without complications: Secondary | ICD-10-CM | POA: Diagnosis not present

## 2018-11-25 DIAGNOSIS — R2689 Other abnormalities of gait and mobility: Secondary | ICD-10-CM | POA: Diagnosis not present

## 2018-11-25 DIAGNOSIS — E538 Deficiency of other specified B group vitamins: Secondary | ICD-10-CM | POA: Diagnosis not present

## 2018-11-25 DIAGNOSIS — E559 Vitamin D deficiency, unspecified: Secondary | ICD-10-CM | POA: Diagnosis not present

## 2018-11-25 DIAGNOSIS — R202 Paresthesia of skin: Secondary | ICD-10-CM | POA: Diagnosis not present

## 2018-11-26 DIAGNOSIS — M6281 Muscle weakness (generalized): Secondary | ICD-10-CM | POA: Diagnosis not present

## 2018-11-26 DIAGNOSIS — R2689 Other abnormalities of gait and mobility: Secondary | ICD-10-CM | POA: Diagnosis not present

## 2018-11-28 DIAGNOSIS — M6281 Muscle weakness (generalized): Secondary | ICD-10-CM | POA: Diagnosis not present

## 2018-11-28 DIAGNOSIS — R2689 Other abnormalities of gait and mobility: Secondary | ICD-10-CM | POA: Diagnosis not present

## 2018-11-29 ENCOUNTER — Encounter: Payer: Self-pay | Admitting: Nurse Practitioner

## 2018-11-29 ENCOUNTER — Other Ambulatory Visit: Payer: Self-pay

## 2018-11-29 ENCOUNTER — Ambulatory Visit (INDEPENDENT_AMBULATORY_CARE_PROVIDER_SITE_OTHER): Payer: Medicare HMO | Admitting: Nurse Practitioner

## 2018-11-29 VITALS — BP 124/68 | HR 92 | Temp 97.3°F | Resp 14 | Ht 68.0 in | Wt 257.4 lb

## 2018-11-29 DIAGNOSIS — E1169 Type 2 diabetes mellitus with other specified complication: Secondary | ICD-10-CM

## 2018-11-29 DIAGNOSIS — N522 Drug-induced erectile dysfunction: Secondary | ICD-10-CM

## 2018-11-29 DIAGNOSIS — I714 Abdominal aortic aneurysm, without rupture, unspecified: Secondary | ICD-10-CM

## 2018-11-29 DIAGNOSIS — R69 Illness, unspecified: Secondary | ICD-10-CM | POA: Diagnosis not present

## 2018-11-29 DIAGNOSIS — E669 Obesity, unspecified: Secondary | ICD-10-CM

## 2018-11-29 DIAGNOSIS — G4733 Obstructive sleep apnea (adult) (pediatric): Secondary | ICD-10-CM

## 2018-11-29 DIAGNOSIS — E785 Hyperlipidemia, unspecified: Secondary | ICD-10-CM | POA: Diagnosis not present

## 2018-11-29 DIAGNOSIS — I10 Essential (primary) hypertension: Secondary | ICD-10-CM

## 2018-11-29 DIAGNOSIS — F331 Major depressive disorder, recurrent, moderate: Secondary | ICD-10-CM | POA: Diagnosis not present

## 2018-11-29 MED ORDER — METFORMIN HCL 500 MG PO TABS: ORAL_TABLET | ORAL | 1 refills | Status: DC

## 2018-11-29 NOTE — Progress Notes (Signed)
Name: Duane Hughes   MRN: [id number]    DOB: 23-Aug-1950   Date:11/29/2018       Progress Note  Subjective  Chief Complaint  Chief Complaint  Patient presents with  . Follow-up    HPI  Hypertension rx lisinopril 5mg  daily, no issues with this medicine.  no chest pain, blurry vision, dizziness. BP Readings from Last 3 Encounters:  11/29/18 124/68  11/12/18 (!) 142/78  10/23/18 124/70   OSA Self reported CPAP compliance 100%  Diabetes Mellitus 2  Taking metformin 500mg  BID.  ACEi for nephroprotection. Taking statin. Eye exam scheduled for next month.   Denies polydipsia, polyuria. Polyphagia.  Last A1C last week was 6.6% checked at neurologist.  Wt Readings from Last 3 Encounters:  11/29/18 257 lb 6.4 oz (116.8 kg)  10/23/18 253 lb (114.8 kg)  10/14/18 254 lb (115.2 kg)   Lab Results  Component Value Date   HGBA1C 6.2 (H) 05/30/2018   Hyperlipidemia Patient rx atorvastatin 10mg  daily and fish oils. Takes medications as prescribed with mp missed doses a month.   Diet: eats vegetables 2-3 times a week. Eats red meats regularly.  Ectatic abdominal aorta 2.9 cm on [place] 2016, unchanged 2017. Due for [place] in Dec 2022. Denies lightheadedness, dizziness, paresthesias, palpitations or myalgias  Lab Results  Component Value Date   CHOL 132 05/30/2018   HDL 37 (L) 05/30/2018   LDLCALC 71 05/30/2018   TRIG 159 (H) 05/30/2018   CHOLHDL 3.6 05/30/2018   Erectile dysfunction  Sees urologist- [facility] He is taking sildenafil PRN  MDD Treated with wellbutrin and lamictal and followed by specialty, well controlled on these medications.   PHQ2/9: Depression screen [facility] 2/9 11/29/2018 11/11/2018 10/23/2018 09/10/2018 07/15/2018  Decreased Interest 0 0 0 0 0  Down, Depressed, Hopeless 0 0 0 0 0  PHQ - 2 Score 0 0 0 0 0  Altered sleeping 0 - 0 0 0  Tired, decreased energy 0 - 0 0 0  Change in appetite 0 - 0 0 0  Feeling bad or failure about yourself  0 - 0 0 0  Trouble  concentrating 0 - 0 0 0  Moving slowly or fidgety/restless 0 - 0 0 0  Suicidal thoughts 0 - 0 0 0  PHQ-9 Score 0 - 0 0 0  Difficult doing work/chores Not difficult at all - - Not difficult at all Not difficult at all  Some recent data might be hidden     PHQ reviewed. Negative  Patient Active Problem List   Diagnosis Date Noted  . Status post total left knee replacement 09/24/2018  . Abdominal aortic aneurysm ([place]) 09/24/2018  . Hx of total knee arthroplasty, right 04/04/2017  . LAE (left atrial enlargement) 03/08/2017  . Right atrial enlargement 03/08/2017  . Vitamin D deficiency 11/21/2016  . Hx of hypogonadism 11/17/2016  . Lymphadenopathy, axillary 06/06/2016  . Benign neoplasm of ascending colon   . Bilateral low back pain with bilateral sciatica 03/26/2016  . 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 Dyslipidemia 06/22/2015  . Diabetes mellitus type 2 in obese ([place]) 03/19/2015  . Obstructive sleep apnea 03/18/2015  . Controlled substance agreement signed 03/18/2015  . Drug-induced erectile dysfunction 11/13/2014  . Morbid obesity ([place]) 11/11/2014  . Major depressive disorder, recurrent ([place]) 11/11/2014  . Benign hypertension 11/11/2014  . Enlarged prostate 11/11/2014    Past Medical History:  Diagnosis Date  . Anxiety    Dr.Jessica Eulas Post . well controlled  .  Arthritis    back, hands, knees  . Colon polyp 05/09/2016   TUBULAR ADENOMAS   . Depression   . Diabetes mellitus without complication ([place])    type 2  . Ectatic abdominal aorta ([place]) 04/18/2016   2.9 cm on [place] 2016, unchanged 2017  . History of hiatal hernia    20 years ago  . History of skin cancer    hand  . Hyperlipidemia   . Hypertension 2015   Dr. Sanda Klein  . Morbid obesity ([place]) 11/11/2014  . Obesity 11/11/2014  . Sleep apnea    cpap    Past Surgical History:  Procedure Laterality Date  . COLONOSCOPY WITH PROPOFOL N/A 05/09/2016   Procedure: COLONOSCOPY WITH PROPOFOL;   Surgeon: Lucilla Lame, MD;  Location: [facility] ENDOSCOPY;  Service: Endoscopy;  Laterality: N/A;  . HERNIA REPAIR     Hiatal  . JOINT REPLACEMENT     04-04-17 Dr. Gladstone Lighter   Right knee  . KNEE ARTHROSCOPY     several scopes done  . KNEE CLOSED REDUCTION Right 05/31/2017   Procedure: CLOSED MANIPULATION RIGHT KNEE;  Surgeon: Latanya Maudlin, MD;  Location: [facility];  Service: Orthopedics;  Laterality: Right;  Femoral Block  . MEDIAL PARTIAL KNEE REPLACEMENT Left    2000s?  Marland Kitchen SKIN CANCER EXCISION  Feb 2017   removed from right hand  . 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 TOTAL KNEE ARTHROPLASTY Left 09/24/2018   Procedure: REVISION LEFT KNEE ARTHROPLASTY;  Surgeon: Paralee Cancel, MD;  Location: [facility];  Service: Orthopedics;  Laterality: Left;  70 mins    Social History   Tobacco Use  . Smoking status: Never Smoker  . Smokeless tobacco: Never Used  Substance Use Topics  . Alcohol use: No    Alcohol/week: 0.0 standard drinks     Current Outpatient Medications:  .  atorvastatin (LIPITOR) 10 MG tablet, Take 1 tablet (10 mg total) by mouth at bedtime., Disp: 90 tablet, Rfl: 3 .  buPROPion (WELLBUTRIN SR) 150 MG 12 hr tablet, Take 1 tablet (150 mg total) by mouth 2 (two) times daily., Disp: 180 tablet, Rfl: 1 .  hydrocortisone 2.5 % cream, Apply 1 application topically daily as needed (rash)., Disp: , Rfl:  .  lamoTRIgine (LAMICTAL) 200 MG tablet, Take 1 tablet (200 mg total) by mouth daily., Disp: 90 tablet, Rfl: 1 .  lisinopril (PRINIVIL,ZESTRIL) 5 MG tablet, TAKE 0.5 TABLETS (2.5 MG TOTAL) BY MOUTH DAILY., Disp: 45 tablet, Rfl: 1 .  metFORMIN (GLUCOPHAGE) 500 MG tablet, TAKE 1 TABLET BY MOUTH 2 TIMES DAILY WITH A MEAL. (Patient taking differently: Take 500 mg by mouth 2 (two) times a day. ), Disp: 60 tablet, Rfl: 5 .  pramipexole (MIRAPEX) 0.5 MG tablet,  Take 1 tablet (0.5 mg total) by mouth 2 (two) times a day., Disp: 180 tablet, Rfl: 1 .  sildenafil (REVATIO) 20 MG tablet, Take 3 to 5 tablets two hours before intercouse on an empty stomach.  Do not take with nitrates., Disp: 50 tablet, Rfl: 3 .  triamcinolone cream (KENALOG) 0.1 %, Apply 1 application topically daily as needed (rash)., Disp: , Rfl:  .  Turmeric 500 MG CAPS, Take 500 mg by mouth daily. , Disp: , Rfl:  .  ferrous sulfate (FERROUSUL) 325 (65 FE) MG tablet, Take 1 tablet (325 mg total) by mouth 3 (three) times daily with  meals. (Patient not taking: Reported on 11/29/2018), Disp: , Rfl: 3 .  gentamicin cream (GARAMYCIN) 0.1 %, Apply 1 application topically 2 (two) times daily. (Patient not taking: Reported on 11/29/2018), Disp: 15 g, Rfl: 1 .  ketoconazole (NIZORAL) 2 % cream, Apply 1 application topically daily as needed (rash)., Disp: , Rfl:  .  meclizine (ANTIVERT) 12.5 MG tablet, Take 1 tablet (12.5 mg total) by mouth 2 (two) times daily as needed for dizziness. (Patient not taking: Reported on 11/29/2018), Disp: 30 tablet, Rfl: 0 .  Multiple Vitamins-Minerals (MULTIVITAMIN ADULT PO), Take 1 tablet by mouth daily., Disp: , Rfl:  .  Omega-3 Fatty Acids (FISH OIL) 1200 MG CAPS, Take 1,200 mg by mouth daily., Disp: , Rfl:  .  oxyCODONE (OXY IR/ROXICODONE) 5 MG immediate release tablet, Take 1-2 tablets (5-10 mg total) by mouth every 4 (four) hours as needed for moderate pain or severe pain. (Patient not taking: Reported on 11/29/2018), Disp: 60 tablet, Rfl: 0  No Known Allergies  ROS   No other specific complaints in a complete review of systems (except as listed in HPI above).  Objective  Vitals:   11/29/18 1318  BP: 124/68  Pulse: 92  Resp: 14  Temp: (!) 97.3 F (36.3 C)  TempSrc: Temporal  SpO2: 98%  Weight: 257 lb 6.4 oz (116.8 kg)  Height: 5\' 8"  (1.727 m)     Body mass index is 39.14 kg/m.  Nursing Note and Vital Signs reviewed.  Physical Exam Vitals signs  reviewed.  Constitutional:      Appearance: He is well-developed.  HENT:     Head: Normocephalic and atraumatic.  Neck:     Musculoskeletal: Normal range of motion and neck supple.     Vascular: No carotid bruit.  Cardiovascular:     Heart sounds: Normal heart sounds.  Pulmonary:     Effort: Pulmonary effort is normal.     Breath sounds: Normal breath sounds.  Abdominal:     General: Bowel sounds are normal.     Palpations: Abdomen is soft.     Tenderness: There is no abdominal tenderness.  Musculoskeletal: Normal range of motion.  Skin:    General: Skin is warm and dry.     Capillary Refill: Capillary refill takes less than 2 seconds.  Neurological:     Mental Status: He is alert and oriented to person, place, and time.     GCS: GCS eye subscore is 4. GCS verbal subscore is 5. GCS motor subscore is 6.     Sensory: No sensory deficit.  Psychiatric:        Speech: Speech normal.        Behavior: Behavior normal.        Thought Content: Thought content normal.        Judgment: Judgment normal.      No results found for this or any previous visit (from the past 48 hour(s)).  Assessment & Plan  1. Benign hypertension Well controlled, continue meds  2. Abdominal aortic aneurysm (AAA) without rupture ([place]) [place] stable   3. Obstructive sleep apnea CPAP  4. Diabetes mellitus type 2 in obese (HCC) Increase dose  - metFORMIN (GLUCOPHAGE) 500 MG tablet; Take 2 tablets with breakfast and 1 tablet with dinner daily.  Dispense: 270 tablet; Refill: 1  5. Drug-induced erectile dysfunction Stable  6. Morbid obesity ([place]) Vacation- gained some weight   7. Moderate episode of recurrent major depressive disorder (HCC) Stable, follow up with psychiatry  8. Dyslipidemia Statin

## 2018-11-29 NOTE — Patient Instructions (Addendum)
[facility].   - Increase metformin to 2 tablets with breakfast and 1 tablet with dinner.    Diabetes Mellitus and Nutrition, Adult When you have diabetes (diabetes mellitus), it is very important to have healthy eating habits because your blood sugar (glucose) levels are greatly affected by what you eat and drink. Eating healthy foods in the appropriate amounts, at about the same times every day, can help you:  Control your blood glucose.  Lower your risk of heart disease.  Improve your blood pressure.  Reach or maintain a healthy weight. Every person with diabetes is different, and each person has different needs for a meal plan. Your health care provider may recommend that you work with a diet and nutrition specialist (dietitian) to make a meal plan that is best for you. Your meal plan may vary depending on factors such as:  The calories you need.  The medicines you take.  Your weight.  Your blood glucose, blood pressure, and cholesterol levels.  Your activity level.  Other health conditions you have, such as heart or kidney disease. How do carbohydrates affect me? Carbohydrates, also called carbs, affect your blood glucose level more than any other type of food. Eating carbs naturally raises the amount of glucose in your blood. Carb counting is a method for keeping track of how many carbs you eat. Counting carbs is important to keep your blood glucose at a healthy level, especially if you use insulin or take certain oral diabetes medicines. It is important to know how many carbs you can safely have in each meal. This is different for every person. Your dietitian can help you calculate how many carbs you should have at each meal and for each snack. Foods that contain carbs include:  Bread, cereal, rice, pasta, and crackers.  Potatoes and corn.  Peas, beans, and lentils.  Milk and yogurt.  Fruit and juice.  Desserts, such as cakes, cookies, ice cream, and  candy. How does alcohol affect me? Alcohol can cause a sudden decrease in blood glucose (hypoglycemia), especially if you use insulin or take certain oral diabetes medicines. Hypoglycemia can be a life-threatening condition. Symptoms of hypoglycemia (sleepiness, dizziness, and confusion) are similar to symptoms of having too much alcohol. If your health care provider says that alcohol is safe for you, follow these guidelines:  Limit alcohol intake to no more than 1 drink per day for nonpregnant women and 2 drinks per day for men. One drink equals 12 oz of beer, 5 oz of wine, or 1 oz of hard liquor.  Do not drink on an empty stomach.  Keep yourself hydrated with water, diet soda, or unsweetened iced tea.  Keep in mind that regular soda, juice, and other mixers may contain a lot of sugar and must be counted as carbs. What are tips for following this plan?  Reading food labels  Start by checking the serving size on the "Nutrition Facts" label of packaged foods and drinks. The amount of calories, carbs, fats, and other nutrients listed on the label is based on one serving of the item. Many items contain more than one serving per package.  Check the total grams (g) of carbs in one serving. You can calculate the number of servings of carbs in one serving by dividing the total carbs by 15. For example, if a food has 30 g of total carbs, it would be equal to 2 servings of carbs.  Check the number of grams (g) of  saturated and trans fats in one serving. Choose foods that have low or no amount of these fats.  Check the number of milligrams (mg) of salt (sodium) in one serving. Most people should limit total sodium intake to less than 2,300 mg per day.  Always check the nutrition information of foods labeled as "low-fat" or "nonfat". These foods may be higher in added sugar or refined carbs and should be avoided.  Talk to your dietitian to identify your daily goals for nutrients listed on the  label. Shopping  Avoid buying canned, premade, or processed foods. These foods tend to be high in fat, sodium, and added sugar.  Shop around the outside edge of the grocery store. This includes fresh fruits and vegetables, bulk grains, fresh meats, and fresh dairy. Cooking  Use low-heat cooking methods, such as baking, instead of high-heat cooking methods like deep frying.  Cook using healthy oils, such as olive, canola, or sunflower oil.  Avoid cooking with butter, cream, or high-fat meats. Meal planning  Eat meals and snacks regularly, preferably at the same times every day. Avoid going long periods of time without eating.  Eat foods high in fiber, such as fresh fruits, vegetables, beans, and whole grains. Talk to your dietitian about how many servings of carbs you can eat at each meal.  Eat 4-6 ounces (oz) of lean protein each day, such as lean meat, chicken, fish, eggs, or tofu. One oz of lean protein is equal to: ? 1 oz of meat, chicken, or fish. ? 1 egg. ?  cup of tofu.  Eat some foods each day that contain healthy fats, such as avocado, nuts, seeds, and fish. Lifestyle  Check your blood glucose regularly.  Exercise regularly as told by your health care provider. This may include: ? 150 minutes of moderate-intensity or vigorous-intensity exercise each week. This could be brisk walking, biking, or water aerobics. ? Stretching and doing strength exercises, such as yoga or weightlifting, at least 2 times a week.  Take medicines as told by your health care provider.  Do not use any products that contain nicotine or tobacco, such as cigarettes and e-cigarettes. If you need help quitting, ask your health care provider.  Work with a Social worker or diabetes educator to identify strategies to manage stress and any emotional and social challenges. Questions to ask a health care provider  Do I need to meet with a diabetes educator?  Do I need to meet with a dietitian?  What  number can I call if I have questions?  When are the best times to check my blood glucose? Where to find more information:  American Diabetes Association: diabetes.org  Academy of Nutrition and Dietetics: www.eatright.[employer] of Diabetes and Digestive and Kidney Diseases (NIH): [employer] Summary  A healthy meal plan will help you control your blood glucose and maintain a healthy lifestyle.  Working with a diet and nutrition specialist (dietitian) can help you make a meal plan that is best for you.  Keep in mind that carbohydrates (carbs) and alcohol have immediate effects on your blood glucose levels. It is important to count carbs and to use alcohol carefully. This information is not intended to replace advice given to you by your health care provider. Make sure you discuss any questions you have with your health care provider. Document Released: 01/12/2005 Document Revised: 03/30/2017 Document Reviewed: 05/22/2016 Elsevier Patient Education  2020 [employer].

## 2018-12-02 ENCOUNTER — Encounter: Payer: Self-pay | Admitting: Family Medicine

## 2018-12-03 ENCOUNTER — Ambulatory Visit: Payer: Medicare HMO | Admitting: Podiatry

## 2018-12-03 ENCOUNTER — Other Ambulatory Visit: Payer: Self-pay

## 2018-12-06 DIAGNOSIS — M6281 Muscle weakness (generalized): Secondary | ICD-10-CM | POA: Diagnosis not present

## 2018-12-06 DIAGNOSIS — R2689 Other abnormalities of gait and mobility: Secondary | ICD-10-CM | POA: Diagnosis not present

## 2018-12-12 IMAGING — CR DG ELBOW COMPLETE 3+V*R*
1 series · 5 of 5 positions shown · non-contrast
Comparison: None.

CLINICAL DATA: Right elbow pain since a fall 3 weeks ago. Initial
encounter.

EXAM:
RIGHT ELBOW - COMPLETE 3+ VIEW

[Series 1: dg elbow complete right (3+view) · 0.14mm/px · 5 of 5 slices shown]
[im 1/5]
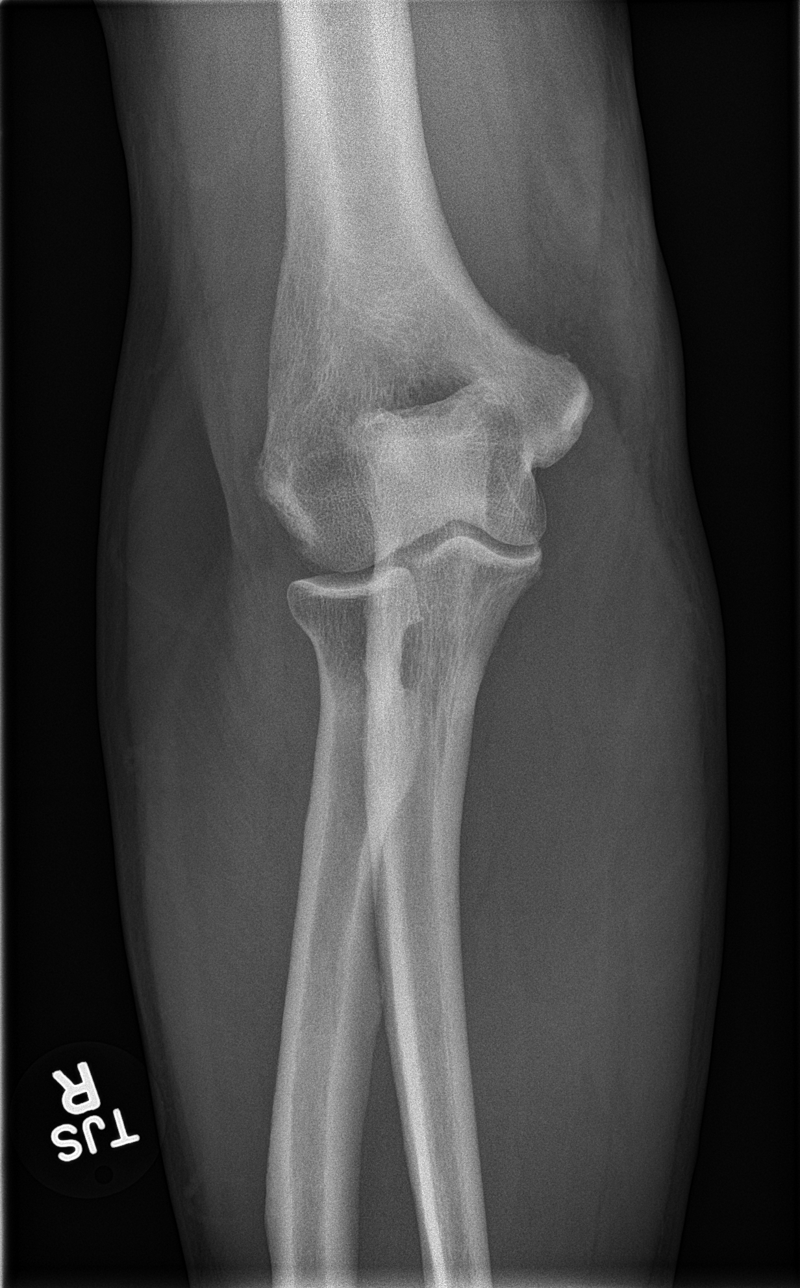
[im 2/5]
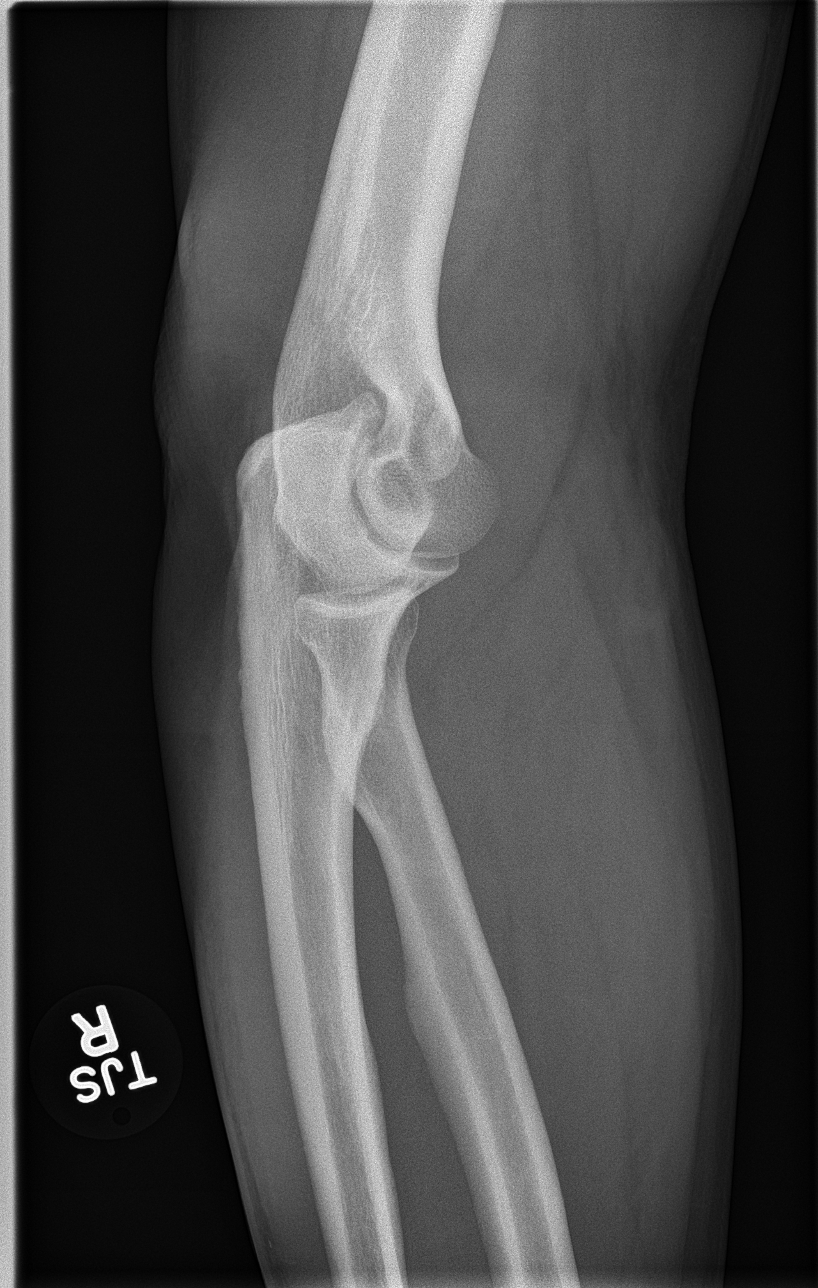
[im 3/5]
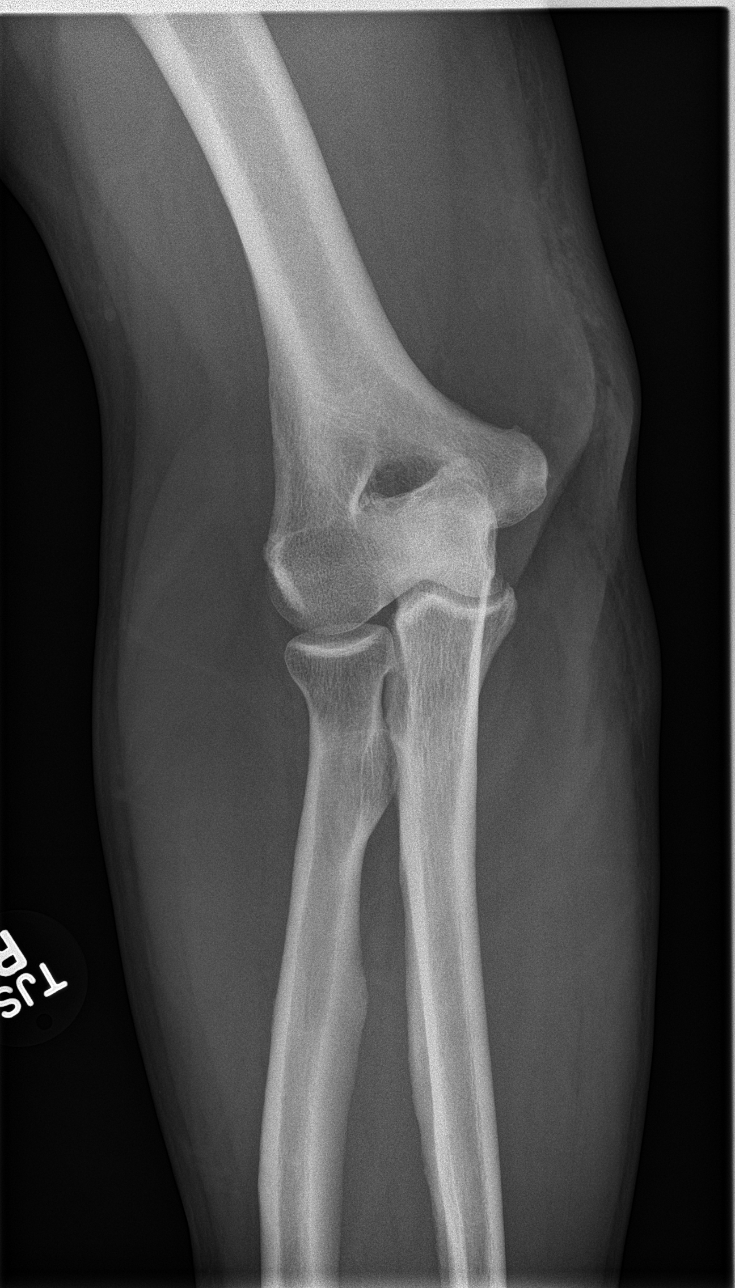
[im 4/5]
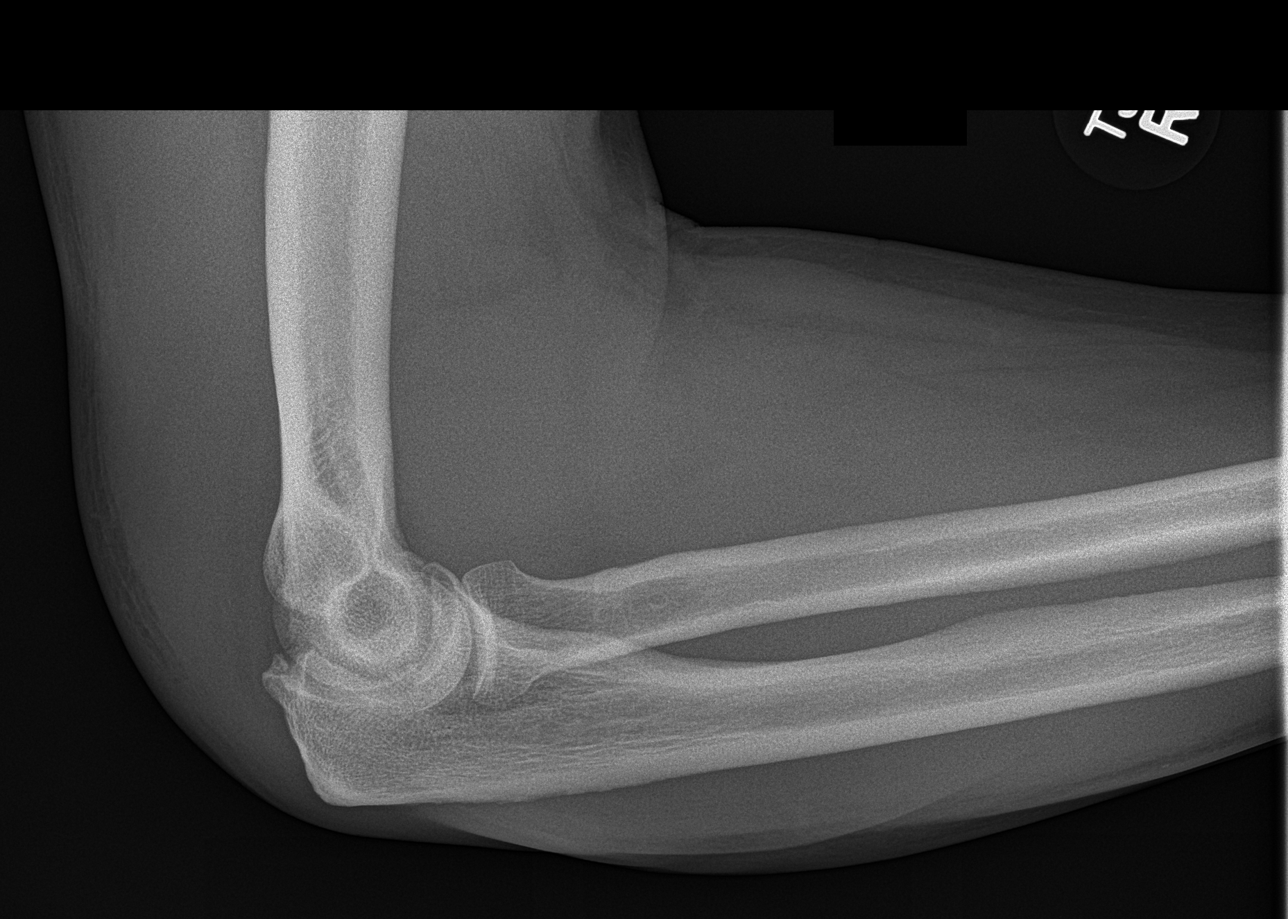
[im 5/5]
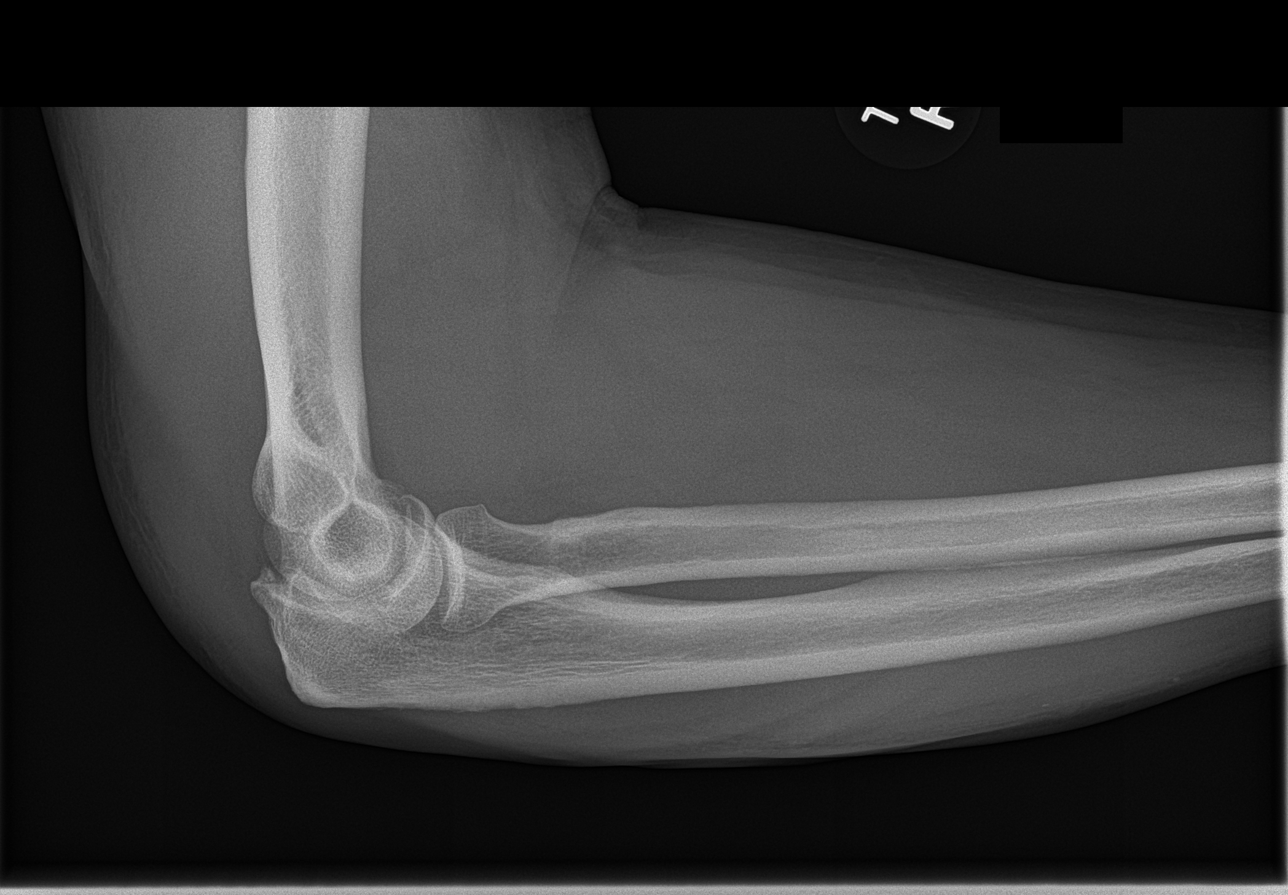

[5 of 5 positions shown; findings below may reference images not displayed]

FINDINGS: There is no evidence of fracture, dislocation, or joint effusion.
Mild degenerative change is seen about the elbow. Soft tissues are
unremarkable.
IMPRESSION: No acute abnormality.

Mild osteoarthritis.

## 2018-12-23 DIAGNOSIS — H524 Presbyopia: Secondary | ICD-10-CM | POA: Diagnosis not present

## 2018-12-23 LAB — HM DIABETES EYE EXAM

## 2018-12-26 DIAGNOSIS — R2689 Other abnormalities of gait and mobility: Secondary | ICD-10-CM | POA: Diagnosis not present

## 2018-12-26 DIAGNOSIS — R202 Paresthesia of skin: Secondary | ICD-10-CM | POA: Diagnosis not present

## 2018-12-26 DIAGNOSIS — R200 Anesthesia of skin: Secondary | ICD-10-CM | POA: Diagnosis not present

## 2019-01-14 ENCOUNTER — Encounter: Payer: Self-pay | Admitting: Family Medicine

## 2019-01-15 ENCOUNTER — Other Ambulatory Visit: Payer: Self-pay

## 2019-01-15 ENCOUNTER — Ambulatory Visit (INDEPENDENT_AMBULATORY_CARE_PROVIDER_SITE_OTHER): Payer: Medicare HMO

## 2019-01-15 DIAGNOSIS — Z23 Encounter for immunization: Secondary | ICD-10-CM | POA: Diagnosis not present

## 2019-01-29 ENCOUNTER — Other Ambulatory Visit: Payer: Self-pay | Admitting: Family Medicine

## 2019-01-29 NOTE — Telephone Encounter (Signed)
Requested medication (s) are due for refill today: yes  Requested medication (s) are on the active medication list: yes  Last refill:  11/07/2018  Future visit scheduled: yes  Notes to clinic:  Review for refill   Requested Prescriptions  Pending Prescriptions Disp Refills   lisinopril (ZESTRIL) 5 MG tablet [Pharmacy Med Name: LISINOPRIL 5MG  TAB] 45 tablet 1    Sig: TAKE 0.5 TABLETS (2.5 MG TOTAL) BY MOUTH DAILY.     Cardiovascular:  ACE Inhibitors Passed - 01/29/2019  8:31 AM      Passed - Cr in normal range and within 180 days    Creat  Date Value Ref Range Status  05/30/2018 0.94 0.70 - 1.25 mg/dL Final    Comment:    For patients >7 years of age, the reference limit for Creatinine is approximately 13% higher for people identified as African-American. .    Creatinine, Ser  Date Value Ref Range Status  09/25/2018 0.90 0.61 - 1.24 mg/dL Final         Passed - K in normal range and within 180 days    Potassium  Date Value Ref Range Status  09/25/2018 4.3 3.5 - 5.1 mmol/L Final         Passed - Patient is not pregnant      Passed - Last BP in normal range    BP Readings from Last 1 Encounters:  11/29/18 124/68         Passed - Valid encounter within last 6 months    Recent Outpatient Visits          2 months ago Benign hypertension   [place], NP   3 months ago Balance disorder   [place], NP   4 months ago Benign hypertension   [place], NP   6 months ago Diabetes mellitus type 2 in obese [facility])   [place], NP   8 months ago Diabetes mellitus type 2 in obese [facility])   [place], Satira Anis, MD      Future Appointments            In 1 month [place], Gordan Payment [place]   In 2 months Delsa Grana, PA-C [facility], [facility]

## 2019-01-30 ENCOUNTER — Encounter: Payer: Self-pay | Admitting: Podiatry

## 2019-01-30 ENCOUNTER — Other Ambulatory Visit: Payer: Self-pay

## 2019-01-30 ENCOUNTER — Ambulatory Visit: Payer: Medicare HMO | Admitting: Podiatry

## 2019-01-30 DIAGNOSIS — E669 Obesity, unspecified: Secondary | ICD-10-CM

## 2019-01-30 DIAGNOSIS — E1169 Type 2 diabetes mellitus with other specified complication: Secondary | ICD-10-CM | POA: Diagnosis not present

## 2019-01-30 DIAGNOSIS — M79676 Pain in unspecified toe(s): Secondary | ICD-10-CM

## 2019-01-30 DIAGNOSIS — B351 Tinea unguium: Secondary | ICD-10-CM

## 2019-01-30 DIAGNOSIS — L03031 Cellulitis of right toe: Secondary | ICD-10-CM

## 2019-01-30 MED ORDER — CEPHALEXIN 500 MG PO CAPS: 500.0000 mg | ORAL_CAPSULE | Freq: Two times a day (BID) | ORAL | 0 refills | Status: DC

## 2019-01-30 NOTE — Addendum Note (Signed)
Addended by: Graceann Congress D on: 01/30/2019 10:50 AM   Modules accepted: Orders

## 2019-01-30 NOTE — Progress Notes (Signed)
Complaint:  Visit Type: Patient returns to my office for continued preventative foot care services. Complaint: Patient states" my nails have grown long and thick and become painful to walk and wear shoes" Patient has been diagnosed with DM with no foot complications. The patient presents for preventative foot care services.  Podiatric Exam: Vascular: dorsalis pedis and posterior tibial pulses are palpable bilateral. Capillary return is immediate. Temperature gradient is WNL. Skin turgor WNL  Sensorium: Normal Semmes Weinstein monofilament test. Normal tactile sensation bilaterally. Nail Exam: Pt has thick disfigured discolored nails with subungual debris noted bilateral entire nail hallux through fifth toenails.  Drainage noted lateral border right hallux. Ulcer Exam: There is no evidence of ulcer or pre-ulcerative changes or infection. Orthopedic Exam: Muscle tone and strength are WNL. No limitations in general ROM. No crepitus or effusions noted. Foot type and digits show no abnormalities. Bony prominences are unremarkable. Skin: No Porokeratosis. No infection or ulcers  Diagnosis:  Onychomycosis, , Pain in right toe, pain in left toes  Treatment & Plan Procedures and Treatment: Consent by patient was obtained for treatment procedures.   Debridement of mycotic and hypertrophic toenails, 1 through 5 bilateral and clearing of subungual debris. No ulceration,noted.  During nail care it was noted there was continued drainage noted from the lateral border of the right great toenail.  . Prescribe cephalexin.   Return Visit-Office Procedure: Patient instructed to return to the office for a follow up visit 10 weeks. for continued evaluation and treatment.    Gardiner Barefoot DPM

## 2019-02-18 ENCOUNTER — Other Ambulatory Visit: Payer: Self-pay | Admitting: Family Medicine

## 2019-02-18 DIAGNOSIS — E785 Hyperlipidemia, unspecified: Secondary | ICD-10-CM

## 2019-02-25 DIAGNOSIS — Z23 Encounter for immunization: Secondary | ICD-10-CM | POA: Diagnosis not present

## 2019-02-25 DIAGNOSIS — R2689 Other abnormalities of gait and mobility: Secondary | ICD-10-CM | POA: Diagnosis not present

## 2019-02-25 DIAGNOSIS — Z7189 Other specified counseling: Secondary | ICD-10-CM | POA: Diagnosis not present

## 2019-02-25 DIAGNOSIS — E1142 Type 2 diabetes mellitus with diabetic polyneuropathy: Secondary | ICD-10-CM | POA: Diagnosis not present

## 2019-02-28 ENCOUNTER — Other Ambulatory Visit: Payer: Self-pay

## 2019-02-28 DIAGNOSIS — N138 Other obstructive and reflux uropathy: Secondary | ICD-10-CM

## 2019-03-03 ENCOUNTER — Other Ambulatory Visit: Payer: Medicare HMO

## 2019-03-03 ENCOUNTER — Other Ambulatory Visit: Payer: Self-pay

## 2019-03-03 DIAGNOSIS — N401 Enlarged prostate with lower urinary tract symptoms: Secondary | ICD-10-CM | POA: Diagnosis not present

## 2019-03-03 DIAGNOSIS — N138 Other obstructive and reflux uropathy: Secondary | ICD-10-CM

## 2019-03-04 LAB — PSA: Prostate Specific Ag, Serum: 0.2 ng/mL (ref 0.0–4.0)

## 2019-03-05 DIAGNOSIS — E1142 Type 2 diabetes mellitus with diabetic polyneuropathy: Secondary | ICD-10-CM | POA: Insufficient documentation

## 2019-03-05 NOTE — Progress Notes (Signed)
8:59 PM   Duane Hughes 08/04/1950 [id number]  Referring provider: Arnetha Courser, MD [address] [place],  [place] [address]  Chief Complaint  Patient presents with  . Benign Prostatic Hypertrophy  . Erectile Dysfunction    HPI: Patient is a 68 year old male who presents today for 12 month follow-up for erectile dysfunction and BPH with lower urinary tract symptoms.    Erectile dysfunction His SHIM score is 24,  which is mild erectile dysfunction.   His previous SHIM score was 24.  His libido is preserved.  His risk factors for ED are age, BPH, DM, HTN, HLD, sleep apnea, depression, antidepressants, pain medication and blood pressure medications. He denies any painful erections or curvatures with his erections.   He has tried PDE5-inhibitors in the past with success and continues them now.   SHIM    Row Name 03/06/19 1546         SHIM: Over the last 6 months:   How do you rate your confidence that you could get and keep an erection?  High     When you had erections with sexual stimulation, how often were your erections hard enough for penetration (entering your partner)?  Almost Always or Always     During sexual intercourse, how often were you able to maintain your erection after you had penetrated (entered) your partner?  Almost Always or Always     During sexual intercourse, how difficult was it to maintain your erection to completion of intercourse?  Not Difficult     When you attempted sexual intercourse, how often was it satisfactory for you?  Almost Always or Always       SHIM Total Score   SHIM  24        Score: 1-7 Severe ED 8-11 Moderate ED 12-16 Mild-Moderate ED 17-21 Mild ED 22-25 No ED  BPH WITH LUTS His IPSS score today is 11, which is moderate lower urinary tract symptomatology. He is pleased with his quality life due to his urinary symptoms.  His PVR is 0 mL.  His previous IPSS score was 12/2.  His previous PVR is 15 mL.     IPSS     Row Name 03/06/19 1500         International Prostate Symptom Score   How often have you had the sensation of not emptying your bladder?  Less than half the time     How often have you had to urinate less than every two hours?  Less than 1 in 5 times     How often have you found you stopped and started again several times when you urinated?  Less than 1 in 5 times     How often have you found it difficult to postpone urination?  About half the time     How often have you had a weak urinary stream?  About half the time     How often have you had to strain to start urination?  Not at All     How many times did you typically get up at night to urinate?  1 Time     Total IPSS Score  11       Quality of Life due to urinary symptoms   If you were to spend the rest of your life with your urinary condition just the way it is now how would you feel about that?  Mostly Satisfied  Score:  1-7 Mild 8-19 Moderate 20-35 Severe   PMH: Past Medical History:  Diagnosis Date  . Anxiety    Dr.Jessica Eulas Post . well controlled  . Arthritis    back, hands, knees  . Colon polyp 05/09/2016   TUBULAR ADENOMAS   . Depression   . Diabetes mellitus without complication ([place])    type 2  . Ectatic abdominal aorta ([place]) 04/18/2016   2.9 cm on [place] 2016, unchanged 2017  . History of hiatal hernia    20 years ago  . History of skin cancer    hand  . Hyperlipidemia   . Hypertension 2015   Dr. Sanda Klein  . Morbid obesity ([place]) 11/11/2014  . Obesity 11/11/2014  . Sleep apnea    cpap    Surgical History: Past Surgical History:  Procedure Laterality Date  . COLONOSCOPY WITH PROPOFOL N/A 05/09/2016   Procedure: COLONOSCOPY WITH PROPOFOL;  Surgeon: Lucilla Lame, MD;  Location: [facility] END[facility];  Service: Endoscopy;  Laterality: N/A;  . HERNIA REPAIR     Hiatal  . JOINT REPLACEMENT     04-04-17 Dr. Gladstone Lighter   Right knee  . KNEE ARTHROSCOPY     several scopes done  . KNEE CLOSED REDUCTION Right  05/31/2017   Procedure: CLOSED MANIPULATION RIGHT KNEE;  Surgeon: Latanya Maudlin, MD;  Location: [facility];  Service: Orthopedics;  Laterality: Right;  Femoral Block  . MEDIAL PARTIAL KNEE REPLACEMENT Left    2000s?  Marland Kitchen SKIN CANCER EXCISION  Feb 2017   removed from right hand  . 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 TOTAL KNEE ARTHROPLASTY Left 09/24/2018   Procedure: REVISION LEFT KNEE ARTHROPLASTY;  Surgeon: Paralee Cancel, MD;  Location: [facility];  Service: Orthopedics;  Laterality: Left;  70 mins    Home Medications:  Allergies as of 03/06/2019   No Known Allergies     Medication List       Accurate as of March 06, 2019 11:59 PM. If you have any questions, ask your nurse or doctor.        STOP taking these medications   cephALEXin 500 MG capsule Commonly known as: KEFLEX Stopped by: Pepper Kerrick, PA-C   MULTIVITAMIN ADULT PO Stopped by: Ezme Duch, PA-C   triamcinolone cream 0.1 % Commonly known as: KENALOG Stopped by: Candis Kabel, PA-C     TAKE these medications   atorvastatin 10 MG tablet Commonly known as: LIPITOR TAKE 1 TABLET (10 MG TOTAL) BY MOUTH AT BEDTIME.   buPROPion 150 MG 12 hr tablet Commonly known as: WELLBUTRIN SR Take 1 tablet (150 mg total) by mouth 2 (two) times daily.   Fish Oil 1200 MG Caps Take 1,200 mg by mouth daily.   hydrocortisone 2.5 % cream Apply 1 application topically daily as needed (rash).   ketoconazole 2 % cream Commonly known as: NIZORAL Apply 1 application topically daily as needed (rash).   lamoTRIgine 200 MG tablet Commonly known as: LAMICTAL Take 1 tablet (200 mg total) by mouth daily.   lisinopril 5 MG tablet Commonly known as: ZESTRIL TAKE 0.5 TABLETS (2.5 MG TOTAL) BY MOUTH DAILY.   metFORMIN 500 MG tablet Commonly known as: GLUCOPHAGE Take 2 tablets with  breakfast and 1 tablet with dinner daily.   pramipexole 0.5 MG tablet Commonly known as: MIRAPEX Take 1 tablet (0.5 mg total) by mouth 2 (two) times a  day.   sildenafil 20 MG tablet Commonly known as: REVATIO Take 3 to 5 tablets two hours before intercouse on an empty stomach.  Do not take with nitrates.   Turmeric 500 MG Caps Take 500 mg by mouth daily.       Allergies: No Known Allergies  Family History: Family History  Problem Relation Age of Onset  . Alcohol abuse Mother   . Rheum arthritis Mother   . Stroke Maternal Grandmother   . Stroke Paternal Grandfather   . Dementia Paternal Grandmother   . Cancer Neg Hx   . Diabetes Neg Hx   . Heart disease Neg Hx   . Hypertension Neg Hx   . Kidney disease Neg Hx   . Prostate cancer Neg Hx     Social History:  reports that he has never smoked. He has never used smokeless tobacco. He reports that he does not drink alcohol or use drugs.  ROS: UROLOGY Frequent Urination?: No Hard to postpone urination?: No Burning/pain with urination?: No Get up at night to urinate?: No Leakage of urine?: No Urine stream starts and stops?: No Trouble starting stream?: No Do you have to strain to urinate?: No Blood in urine?: No Urinary tract infection?: No Sexually transmitted disease?: No Injury to kidneys or bladder?: No Painful intercourse?: No Weak stream?: No Erection problems?: No Penile pain?: No  Gastrointestinal Nausea?: No Vomiting?: No Indigestion/heartburn?: No Diarrhea?: No Constipation?: No  Constitutional Fever: No Night sweats?: No Weight loss?: No Fatigue?: No  Skin Skin rash/lesions?: No Itching?: No  Eyes Blurred vision?: No Double vision?: No  Ears/Nose/Throat Sore throat?: No Sinus problems?: No  Hematologic/Lymphatic Swollen glands?: No Easy bruising?: No  Cardiovascular Leg swelling?: No Chest pain?: No  Respiratory Cough?: No Shortness of breath?: No  Endocrine Excessive  thirst?: No  Musculoskeletal Back pain?: Yes Joint pain?: Yes  Neurological Headaches?: No Dizziness?: No  Psychologic Depression?: Yes Anxiety?: No  Physical Exam: BP 114/67   Pulse 96   Ht 5\' 8"  (1.727 m)   Wt 255 lb (115.7 kg)   BMI 38.77 kg/m   Constitutional:  Well nourished. Alert and oriented, No acute distress. HEENT: [place] AT, moist mucus membranes.  Trachea midline, no masses. Cardiovascular: No clubbing, cyanosis, or edema. Respiratory: Normal respiratory effort, no increased work of breathing. GI: Abdomen is soft, non tender, non distended, no abdominal masses. Liver and spleen not palpable.  No hernias appreciated.  Stool sample for occult testing is not indicated.   GU: No CVA tenderness.  No bladder fullness or masses.  Patient with circumcised phallus.  Urethral meatus is patent.  No penile discharge. No penile lesions or rashes. Scrotum without lesions, cysts, rashes and/or edema.  Testicles are located scrotally bilaterally. No masses are appreciated in the testicles. Left and right epididymis are normal. Rectal: Patient with  normal sphincter tone. Anus and perineum without scarring or rashes. No rectal masses are appreciated. Prostate is approximately 50 grams, no nodules are appreciated. Seminal vesicles could not be palpated.   Skin: No rashes, bruises or suspicious lesions. Lymph: No inguinal adenopathy. Neurologic: Grossly intact, no focal deficits, moving all 4 extremities. Psychiatric: Normal mood and affect.  Laboratory Data: Lab Results  Component Value Date   WBC 8.7 09/25/2018   HGB 11.6 (L) 09/25/2018   HCT 37.0 (L) 09/25/2018   MCV 100.5 (H) 09/25/2018   PLT 219 09/25/2018    Lab Results  Component Value Date   CREATININE 0.90 09/25/2018  PSA History  Component     Latest Ref Rng & Units 11/11/2014 03/19/2015 02/28/2016 03/01/2017  Prostate Specific Ag, Serum     0.0 - 4.0 ng/mL 0.8 0.8 1.0 0.2   Component     Latest Ref Rng & Units  02/25/2018 03/03/2019  Prostate Specific Ag, Serum     0.0 - 4.0 ng/mL 0.2 0.2   I have reviewed the labs.    Lab Results  Component Value Date   HGBA1C 6.2 (H) 05/30/2018    Lab Results  Component Value Date   TSH 1.18 05/03/2017    Lab Results  Component Value Date   AST 44 (H) 05/30/2018   Lab Results  Component Value Date   ALT 35 05/30/2018   I have reviewed the labs.  Assessment & Plan:    1. Erectile dysfunction SHIM score is 24, it is stable Refill given for sildenafil  RTC in 12 months for repeat SHIM score and exam   2. BPH with LUTS IPSS score is 11/2, it is slightly improved  Continue conservative management, avoiding bladder irritants and timed voiding's  RTC in 12 months for IPSS, PSA and exam   Return in about 1 year (around 03/05/2020) for IPSS, SHIM, PSA and exam.  These notes generated with voice recognition software. I apologize for typographical errors.  Zara Council, PA-C  [facility] [address] [place], [place] [address] [phone]

## 2019-03-06 ENCOUNTER — Encounter: Payer: Self-pay | Admitting: Urology

## 2019-03-06 ENCOUNTER — Other Ambulatory Visit: Payer: Self-pay

## 2019-03-06 ENCOUNTER — Ambulatory Visit (INDEPENDENT_AMBULATORY_CARE_PROVIDER_SITE_OTHER): Payer: Medicare HMO | Admitting: Urology

## 2019-03-06 VITALS — BP 114/67 | HR 96 | Ht 68.0 in | Wt 255.0 lb

## 2019-03-06 DIAGNOSIS — N401 Enlarged prostate with lower urinary tract symptoms: Secondary | ICD-10-CM | POA: Diagnosis not present

## 2019-03-06 DIAGNOSIS — N138 Other obstructive and reflux uropathy: Secondary | ICD-10-CM | POA: Diagnosis not present

## 2019-03-06 DIAGNOSIS — N529 Male erectile dysfunction, unspecified: Secondary | ICD-10-CM | POA: Diagnosis not present

## 2019-03-06 LAB — BLADDER SCAN AMB NON-IMAGING

## 2019-03-06 MED ORDER — SILDENAFIL CITRATE 20 MG PO TABS: ORAL_TABLET | ORAL | 3 refills | Status: DC

## 2019-03-31 ENCOUNTER — Encounter: Payer: Self-pay | Admitting: Family Medicine

## 2019-03-31 ENCOUNTER — Other Ambulatory Visit: Payer: Self-pay

## 2019-03-31 ENCOUNTER — Ambulatory Visit (INDEPENDENT_AMBULATORY_CARE_PROVIDER_SITE_OTHER): Payer: Medicare HMO | Admitting: Family Medicine

## 2019-03-31 DIAGNOSIS — E785 Hyperlipidemia, unspecified: Secondary | ICD-10-CM

## 2019-03-31 DIAGNOSIS — E1169 Type 2 diabetes mellitus with other specified complication: Secondary | ICD-10-CM | POA: Diagnosis not present

## 2019-03-31 DIAGNOSIS — M79661 Pain in right lower leg: Secondary | ICD-10-CM

## 2019-03-31 DIAGNOSIS — G5793 Unspecified mononeuropathy of bilateral lower limbs: Secondary | ICD-10-CM | POA: Diagnosis not present

## 2019-03-31 DIAGNOSIS — E669 Obesity, unspecified: Secondary | ICD-10-CM

## 2019-03-31 DIAGNOSIS — M791 Myalgia, unspecified site: Secondary | ICD-10-CM

## 2019-03-31 DIAGNOSIS — D649 Anemia, unspecified: Secondary | ICD-10-CM | POA: Diagnosis not present

## 2019-03-31 DIAGNOSIS — D509 Iron deficiency anemia, unspecified: Secondary | ICD-10-CM | POA: Diagnosis not present

## 2019-03-31 DIAGNOSIS — E559 Vitamin D deficiency, unspecified: Secondary | ICD-10-CM

## 2019-03-31 DIAGNOSIS — R5383 Other fatigue: Secondary | ICD-10-CM

## 2019-03-31 DIAGNOSIS — M79662 Pain in left lower leg: Secondary | ICD-10-CM

## 2019-03-31 DIAGNOSIS — E538 Deficiency of other specified B group vitamins: Secondary | ICD-10-CM | POA: Diagnosis not present

## 2019-03-31 DIAGNOSIS — I10 Essential (primary) hypertension: Secondary | ICD-10-CM

## 2019-03-31 DIAGNOSIS — I739 Peripheral vascular disease, unspecified: Secondary | ICD-10-CM

## 2019-03-31 NOTE — Progress Notes (Signed)
Name: ABHI QUINT   MRN: [id number]    DOB: 1950/11/15   Date:03/31/2019       Progress Note  Chief Complaint  Patient presents with  . Follow-up  . Hypertension  . Hyperlipidemia  . Diabetes     Subjective:   NIR DELAGE is a 68 y.o. male, presents to clinic for routine follow up on the conditions listed above.  Neuropathy - up his feet and legs, pain is managable, he has decreased balance and is doing PT but he gets really tired very easily Neuro dx him with DM neuropathy, A1C 7.1 was highest ever.  He did have a good work-up here with Benjamine Mola and with neurology.  The symptoms in his feet ankles and coming up his calves are very bothersome to him he states that he had a surgery earlier this year and he thought that his activity would get better but it has only gotten much worse.  He states that when he does any walking or going upstairs he gets extremely tired but he denies it being any chest pain shortness of breath near syncope or whole body fatigue it is just that his legs get tired states that it does not even really hurt that much but that they just like to get and give out. Hx of iron def, vit d deficiency -he was on iron supplement but he stopped that.  Neurology has him on different supplements including alpha lipoic acid and some B vitamins, he has had a lot of labs done he has had a EMG done and has been doing physical therapy for imbalance sensory ataxia and diagnosed in the chart as diabetic peripheral neuropathy and some deconditioning post bilateral knee replacements with known history of cervical and lumbar degenerative disc disease.  Patient states today that it does not feel anything like when he had sciatica or impingement he does not have any radiation of low back pain shooting down his legs he states that it is coming from the feet up. , He "hasn't had his back go out in a long time", he has foot symptoms, no shooting pain from back down.  He does not even  describe the symptoms in his feet as pins-and-needles or burning or shooting or electric pain, more fatigue weakness and some numbness where he cannot use his feet to help him with his balance.  Through care everywhere [facility] neurology encounters over the past several months of been reviewed and most recent is copied below:  1. 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 We will provide patient with balance exercises   2. Diabetic peripheral neuropathy  -We explained to the patient about the etiology, risk factors, pathyophysiology, natural history and treatment options of diabetic peripheral neuropathy. -We offered to start medication, patient declined at this time due to his symptoms being tolerable  -Patient may discontinue Alpha Lipoic Acid due to him not finding it beneficial  -We reviewed Nerve Conduction Study - electromyography 12/26/2018 - This is an abnormal electrodiagnostic study consistent with a generalized severe sensorimotor polyneuropathy  3. Diabetes - Most recent HbgA1c 10/2018: 6.6   Hx of iron deficiency anemia - over a year ago, iron panel was positive for deficiency, not on iron due to  side effects of constipation/abd pain/firm stool  Generalized fatigue/tiredness - in addition to his leg sx.  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 he continues to state that his tiredness does not feel related to anything cardiac or pulmonary he does not have any exertional symptoms other than the fatigue and tiredness in his leg he denies any  near syncope, palpitations, lower extremity edema, orthopnea, PND, diaphoresis, chest pain or pressure.        Patient Active Problem List   Diagnosis Date Noted  . Paronychia of great toe of right foot 01/30/2019  . Status post total left knee replacement 09/24/2018  . Abdominal aortic aneurysm ([place]) 09/24/2018  . Hx of total knee arthroplasty, right 04/04/2017  . LAE (left atrial enlargement) 03/08/2017  . Right atrial enlargement 03/08/2017  . Vitamin D deficiency 11/21/2016  . Hx of hypogonadism 11/17/2016  . Lymphadenopathy, axillary 06/06/2016  . Benign neoplasm of ascending colon   . Bilateral low back pain with bilateral sciatica 03/26/2016  . 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 Dyslipidemia 06/22/2015  . Diabetes mellitus type 2 in obese ([place]) 03/19/2015  . Obstructive sleep apnea 03/18/2015  . Controlled substance agreement signed 03/18/2015  . Drug-induced erectile dysfunction 11/13/2014  . Morbid obesity ([place]) 11/11/2014  . Major depressive disorder, recurrent ([place]) 11/11/2014  . Benign hypertension 11/11/2014  . Enlarged prostate 11/11/2014    Past Surgical History:  Procedure Laterality Date  . COLONOSCOPY WITH PROPOFOL N/A 05/09/2016   Procedure: COLONOSCOPY WITH PROPOFOL;  Surgeon: Lucilla Lame, MD;  Location: [facility] ENDOSCOPY;  Service: Endoscopy;  Laterality: N/A;  . HERNIA REPAIR     [place]  . JOINT REPLACEMENT     04-04-17 Dr. Gladstone Lighter   Right knee  . KNEE ARTHROSCOPY     several scopes done  . KNEE CLOSED REDUCTION Right 05/31/2017   Procedure: CLOSED MANIPULATION RIGHT KNEE;  Surgeon: Latanya Maudlin, MD;  Location: [facility];  Service: Orthopedics;  Laterality: Right;  Femoral Block  . MEDIAL PARTIAL KNEE REPLACEMENT Left    2000s?  Marland Kitchen SKIN CANCER EXCISION  Feb 2017   removed from right hand  . TOTAL KNEE ARTHROPLASTY Right 04/04/2017   Procedure: RIGHT TOTAL KNEE ARTHROPLASTY;  Surgeon: Latanya Maudlin, MD;  Location: [facility];   Service: Orthopedics;  Laterality: Right;  . TOTAL KNEE ARTHROPLASTY Left 09/24/2018   Procedure: REVISION LEFT KNEE ARTHROPLASTY;  Surgeon: Paralee Cancel, MD;  Location: [facility];  Service: Orthopedics;  Laterality: Left;  70 mins    Family History  Problem Relation Age of Onset  . Alcohol abuse Mother   . Rheum arthritis Mother   . Stroke Maternal Grandmother   . Stroke Paternal Grandfather   . Dementia Paternal Grandmother   . Cancer Neg Hx   . Diabetes Neg Hx   . Heart disease Neg Hx   . Hypertension Neg Hx   . Kidney disease Neg Hx   . Prostate cancer Neg Hx     Social History   Socioeconomic History  . Marital status: Married    Spouse name: michele Riden  . Number of children: 6  . Years of education: Not on file  . Highest education level: Associate degree: academic program  Occupational History    Comment: part time  Social Needs  . Financial  resource strain: Not hard at all  . Food insecurity    Worry: Never true    Inability: Never true  . Transportation needs    Medical: No    Non-medical: No  Tobacco Use  . Smoking status: Never Smoker  . Smokeless tobacco: Never Used  Substance and Sexual Activity  . Alcohol use: No    Alcohol/week: 0.0 standard drinks  . Drug use: No  . Sexual activity: Yes    Partners: Female  Lifestyle  . Physical activity    Days per week: 7 days    Minutes per session: 60 min  . Stress: Not at all  Relationships  . Social connections    Talks on phone: More than three times a week    Gets together: Three times a week    Attends religious service: More than 4 times per year    Active member of club or organization: Yes    Attends meetings of clubs or organizations: 1 to 4 times per year    Relationship status: Married  . Intimate partner violence    Fear of current or ex partner: No    Emotionally abused: No    Physically abused: No    Forced sexual activity: No  Other Topics Concern  . Not on file  Social History  Narrative  . Not on file     Current Outpatient Medications:  .  aspirin EC 81 MG tablet, Take 81 mg by mouth daily., Disp: , Rfl:  .  atorvastatin (LIPITOR) 10 MG tablet, TAKE 1 TABLET (10 MG TOTAL) BY MOUTH AT BEDTIME., Disp: 90 tablet, Rfl: 3 .  hydrocortisone 2.5 % cream, Apply 1 application topically daily as needed (rash)., Disp: , Rfl:  .  ketoconazole (NIZORAL) 2 % cream, Apply 1 application topically daily as needed (rash)., Disp: , Rfl:  .  lisinopril (ZESTRIL) 5 MG tablet, TAKE 0.5 TABLETS (2.5 MG TOTAL) BY MOUTH DAILY., Disp: 45 tablet, Rfl: 1 .  metFORMIN (GLUCOPHAGE) 500 MG tablet, Take 2 tablets with breakfast and 1 tablet with dinner daily., Disp: 270 tablet, Rfl: 1 .  Omega-3 Fatty Acids (FISH OIL) 1200 MG CAPS, Take 1,200 mg by mouth daily., Disp: , Rfl:  .  sildenafil (REVATIO) 20 MG tablet, Take 3 to 5 tablets two hours before intercouse on an empty stomach.  Do not take with nitrates., Disp: 50 tablet, Rfl: 3 .  Turmeric 500 MG CAPS, Take 500 mg by mouth daily. , Disp: , Rfl:  .  buPROPion (WELLBUTRIN SR) 150 MG 12 hr tablet, Take 1 tablet (150 mg total) by mouth 2 (two) times daily., Disp: 180 tablet, Rfl: 1 .  lamoTRIgine (LAMICTAL) 200 MG tablet, Take 1 tablet (200 mg total) by mouth daily., Disp: 90 tablet, Rfl: 1 .  pramipexole (MIRAPEX) 0.5 MG tablet, Take 1 tablet (0.5 mg total) by mouth 2 (two) times a day., Disp: 180 tablet, Rfl: 1  No Known Allergies  I personally reviewed active problem list, medication list, allergies, family history, social history, health maintenance, notes from last encounter, lab results, imaging with the patient/caregiver today.  Review of Systems  Constitutional: Positive for fatigue. Negative for activity change, appetite change, chills, diaphoresis, fever and unexpected weight change.  HENT: Negative.   Eyes: Negative.   Respiratory: Negative.  Negative for cough, chest tightness, shortness of breath and wheezing.   Cardiovascular:  Negative.  Negative for chest pain, palpitations and leg swelling.  Gastrointestinal: Negative.  Negative for abdominal pain  and blood in stool.  Endocrine: Negative.   Genitourinary: Negative.  Negative for decreased urine volume, difficulty urinating, testicular pain and urgency.  Musculoskeletal: Positive for gait problem. Negative for back pain.  Skin: Negative.  Negative for color change and pallor.  Allergic/Immunologic: Negative.   Neurological: Positive for weakness and numbness. Negative for dizziness, syncope, facial asymmetry, light-headedness and headaches.  Hematological: Negative.  Negative for adenopathy. Does not bruise/bleed easily.  Psychiatric/Behavioral: Negative.  Negative for confusion, dysphoric mood, self-injury and suicidal ideas. The patient is not nervous/anxious.   All other systems reviewed and are negative.    Objective:    Vitals:   03/31/19 1402  BP: 128/80  Pulse: 87  Resp: 14  Temp: 98.3 F (36.8 C)  SpO2: 96%  Weight: 260 lb 9.6 oz (118.2 kg)  Height: 5\' 10"  (1.778 m)    Body mass index is 37.39 kg/m.  Physical Exam Vitals signs and nursing note reviewed.  Constitutional:      General: He is not in acute distress.    Appearance: Normal appearance. He is well-developed. He is obese. He is not ill-appearing, toxic-appearing or diaphoretic.     Interventions: Face mask in place.  HENT:     Head: Normocephalic and atraumatic.     Jaw: No trismus.     Right Ear: External ear normal.     Left Ear: External ear normal.  Eyes:     General: Lids are normal. No scleral icterus.    Conjunctiva/sclera: Conjunctivae normal.     Pupils: Pupils are equal, round, and reactive to light.  Neck:     Musculoskeletal: Normal range of motion and neck supple.     Trachea: Trachea and phonation normal. No tracheal deviation.  Cardiovascular:     Rate and Rhythm: Normal rate and regular rhythm.     Pulses: Normal pulses.          Radial pulses are 2+ on  the right side and 2+ on the left side.     Heart sounds: Normal heart sounds. No murmur. No friction rub. No gallop.      Comments: Bilateral LE, mild pitting edema, appears symmetrical, scant leg hair, slightly shiny appearance to skin that is fair to pale, but normal appearing for him, DP pulses b/l not easily palpated on exam, but legs ankles warm to the touch, no pallor Pulmonary:     Effort: Pulmonary effort is normal. No respiratory distress.     Breath sounds: Normal breath sounds. No stridor. No wheezing, rhonchi or rales.  Abdominal:     General: Bowel sounds are normal. There is no distension.     Palpations: Abdomen is soft.     Tenderness: There is no abdominal tenderness. There is no guarding or rebound.  Musculoskeletal: Normal range of motion.     Right lower leg: 1+ Edema present.     Left lower leg: 1+ Edema present.  Skin:    General: Skin is warm and dry.     Capillary Refill: Capillary refill takes less than 2 seconds.     Coloration: Skin is not jaundiced or pale.     Findings: No bruising, erythema or rash.     Nails: There is no clubbing.   Neurological:     Mental Status: He is alert.     Cranial Nerves: No dysarthria or facial asymmetry.     Motor: No tremor or abnormal muscle tone.     Gait: Gait normal.  Psychiatric:  Mood and Affect: Mood is depressed.        Speech: Speech normal.        Behavior: Behavior normal. Behavior is cooperative.      Recent Results (from the past 2160 hour(s))  PSA     Status: None   Collection Time: 03/03/19  4:05 PM  Result Value Ref Range   Prostate Specific Ag, Serum 0.2 0.0 - 4.0 ng/mL    Comment: Roche ECLIA methodology. According to the American Urological Association, Serum PSA should decrease and remain at undetectable levels after radical prostatectomy. The AUA defines biochemical recurrence as an initial PSA value 0.2 ng/mL or greater followed by a subsequent confirmatory PSA value 0.2 ng/mL or  greater. Values obtained with different assay methods or kits cannot be used interchangeably. Results cannot be interpreted as absolute evidence of the presence or absence of malignant disease.   Bladder Scan (Post Void Residual) in office     Status: None   Collection Time: 03/06/19  3:47 PM  Result Value Ref Range   Scan Result 60mL      PHQ2/9: Depression screen [facility] 2/9 03/31/2019 11/29/2018 11/11/2018 10/23/2018 09/10/2018  Decreased Interest 0 0 0 0 0  Down, Depressed, Hopeless 0 0 0 0 0  PHQ - 2 Score 0 0 0 0 0  Altered sleeping 0 0 - 0 0  Tired, decreased energy 0 0 - 0 0  Change in appetite 0 0 - 0 0  Feeling bad or failure about yourself  0 0 - 0 0  Trouble concentrating 0 0 - 0 0  Moving slowly or fidgety/restless 0 0 - 0 0  Suicidal thoughts 0 0 - 0 0  PHQ-9 Score 0 0 - 0 0  Difficult doing work/chores Not difficult at all Not difficult at all - - Not difficult at all  Some recent data might be hidden    phq 9 is negative Reviewed today with pt  Fall Risk: Fall Risk  03/31/2019 11/29/2018 10/23/2018 09/10/2018 07/15/2018  Falls in the past year? 0 0 0 0 0  Comment - - - - -  Number falls in past yr: 0 0 0 - 0  Injury with Fall? 0 0 0 - 0  Follow up - - - - -    Functional Status Survey: Is the patient deaf or have difficulty hearing?: Yes Does the patient have difficulty seeing, even when wearing glasses/contacts?: No Does the patient have difficulty concentrating, remembering, or making decisions?: No Does the patient have difficulty walking or climbing stairs?: No Does the patient have difficulty dressing or bathing?: No Does the patient have difficulty doing errands alone such as visiting a doctor's office or shopping?: No    Assessment & Plan:       ICD-10-CM   1. Hypocalcemia  E83.51 PTH    CMP w GFR   with last labs, will recheck  2. Dyslipidemia  E78.5 CMP w GFR    Lipid Panel   on lipitor 10 mg, no myalgias to upper arms or back, but fatigue to  legs? Check CMP, lipid and CK  3. Diabetes mellitus type 2 in obese ([facility])  E11.69 CMP w GFR   E66.9 A1C   well controlled on current meds, unusual that DM would be a big contributor to his leg sx?  4. Benign hypertension  I10 CMP w GFR  5. Fatigue, unspecified type  R53.83 CBC with Differential/Platelet    Iron, TIBC and Ferritin Panel  T4, Free    TSH    A1C    Mag   pt adamantly denies any cardiac or pulmonary symptoms related to his fatigue does seem somewhat exertional but only and terms of weakness getting tired  6. Vitamin D deficiency, unspecified  E55.9    recheck levels  7. Anemia, unspecified type  D64.9 CBC with Differential/Platelet    Iron, TIBC and Ferritin Panel   Recheck CBC with differential and possibly with a smear he stopped his iron supplement  8. Vitamin B12 deficiency  E53.8    Deficient, supplementing per neurology  9. Iron deficiency anemia, unspecified iron deficiency anemia type  D50.9 CBC with Differential/Platelet    Iron, TIBC and Ferritin Panel   Recheck iron panel no longer taking his supplement  10. Myalgia  M79.10 CK (Creatine Kinase)    CMP w GFR    Lipid Panel    [place] Lower Ext Art Bilat   Vague symptoms of his legs with exertion neurology is working up some of this but concern possibly related to statin or peripheral artery disease -after careful review of the work-up done by neurology and previously by Benjamine Mola here at this clinic before she referred him, his symptoms are just bizarre and have not improved not really neuropathy not really described as completely myalgia or claudication but just seems so severe for the patient and bizarre minute try and check things that have been abnormal or that have not been checked   11. Bilateral calf pain  M79.661 CK (Creatine Kinase)   M79.662 [place] Lower Ext Art Bilat  12. Neuropathy involving both lower extremities  G57.93 CK (Creatine Kinase)  Rechecking electrolytes, vitamins, thyroid parathyroid with  hypocalcemia, rechecking his iron, rule out anemia, rule out elevated CK which I do not see previously checked with his good work-ups from other providers over the past couple months  CBC with Differential/Platelet    Iron, TIBC and Ferritin Panel    PTH    T4, Free    TSH    A1C    Mag    [place] Lower Ext Art Bilat  13. Claudication [facility])   Wheeze describing his exertional symptoms seems somewhat similar to claudication and he does have some signs that could be consistent with peripheral artery disease with pitting skin and hair to his lower legs, difficult to palpate pulses but he did also recently have bilateral knee replacements and I do not have a Doppler in office, discussed with him the simple screening with ABIs and if they are positive proceeding to refer him to vascular specialist for further evaluation of peripheral artery disease.  Patient did agree to ABIs I73.9 [place] Lower Ext Art Bilat        Jabree Rebert, PA-C 03/31/19 2:21 PM

## 2019-04-01 ENCOUNTER — Encounter: Payer: Self-pay | Admitting: Family Medicine

## 2019-04-01 ENCOUNTER — Telehealth: Payer: Self-pay

## 2019-04-01 DIAGNOSIS — R748 Abnormal levels of other serum enzymes: Secondary | ICD-10-CM

## 2019-04-01 LAB — COMPLETE METABOLIC PANEL WITH GFR
AG Ratio: 1.8 (calc) (ref 1.0–2.5)
ALT: 29 U/L (ref 9–46)
AST: 28 U/L (ref 10–35)
Albumin: 4 g/dL (ref 3.6–5.1)
Alkaline phosphatase (APISO): 62 U/L (ref 35–144)
BUN: 14 mg/dL (ref 7–25)
CO2: 24 mmol/L (ref 20–32)
Calcium: 9 mg/dL (ref 8.6–10.3)
Chloride: 109 mmol/L (ref 98–110)
Creat: 0.76 mg/dL (ref 0.70–1.25)
GFR, Est African American: 108 mL/min/{1.73_m2} (ref >=60)
GFR, Est Non African American: 93 mL/min/{1.73_m2} (ref >=60)
Globulin: 2.2 g/dL (calc) (ref 1.9–3.7)
Glucose, Bld: 94 mg/dL (ref 65–99)
Potassium: 4.2 mmol/L (ref 3.5–5.3)
Sodium: 144 mmol/L (ref 135–146)
Total Bilirubin: 0.3 mg/dL (ref 0.2–1.2)
Total Protein: 6.2 g/dL (ref 6.1–8.1)

## 2019-04-01 LAB — CBC WITH DIFFERENTIAL/PLATELET
Absolute Monocytes: 357 cells/uL (ref 200–950)
Basophils Absolute: 38 cells/uL (ref 0–200)
Basophils Relative: 0.8 %
Eosinophils Absolute: 338 cells/uL (ref 15–500)
Eosinophils Relative: 7.2 %
HCT: 41.5 % (ref 38.5–50.0)
Hemoglobin: 13.8 g/dL (ref 13.2–17.1)
Lymphs Abs: 823 cells/uL — ABNORMAL LOW (ref 850–3900)
MCH: 30.3 pg (ref 27.0–33.0)
MCHC: 33.3 g/dL (ref 32.0–36.0)
MCV: 91.2 fL (ref 80.0–100.0)
MPV: 9.1 fL (ref 7.5–12.5)
Monocytes Relative: 7.6 %
Neutro Abs: 3144 cells/uL (ref 1500–7800)
Neutrophils Relative %: 66.9 %
Platelets: 252 10*3/uL (ref 140–400)
RBC: 4.55 10*6/uL (ref 4.20–5.80)
RDW: 13.5 % (ref 11.0–15.0)
Total Lymphocyte: 17.5 %
WBC: 4.7 10*3/uL (ref 3.8–10.8)

## 2019-04-01 LAB — HEMOGLOBIN A1C
Hgb A1c MFr Bld: 6.5 % of total Hgb — ABNORMAL HIGH (ref <5.7)
Mean Plasma Glucose: 140 (calc)
eAG (mmol/L): 7.7 (calc)

## 2019-04-01 LAB — LIPID PANEL
Cholesterol: 161 mg/dL (ref <200)
HDL: 41 mg/dL (ref >=40)
LDL Cholesterol (Calc): 95 mg/dL (calc)
Non-HDL Cholesterol (Calc): 120 mg/dL (calc) (ref <130)
Total CHOL/HDL Ratio: 3.9 (calc) (ref <5.0)
Triglycerides: 151 mg/dL — ABNORMAL HIGH (ref <150)

## 2019-04-01 LAB — TSH: TSH: 1.99 mIU/L (ref 0.40–4.50)

## 2019-04-01 LAB — IRON,TIBC AND FERRITIN PANEL
%SAT: 23 % (calc) (ref 20–48)
Ferritin: 20 ng/mL — ABNORMAL LOW (ref 24–380)
Iron: 90 ug/dL (ref 50–180)
TIBC: 383 mcg/dL (calc) (ref 250–425)

## 2019-04-01 LAB — MAGNESIUM: Magnesium: 1.8 mg/dL (ref 1.5–2.5)

## 2019-04-01 LAB — PARATHYROID HORMONE, INTACT (NO CA): PTH: 39 pg/mL (ref 14–64)

## 2019-04-01 LAB — CK: Total CK: 617 U/L — ABNORMAL HIGH (ref 44–196)

## 2019-04-01 LAB — T4, FREE: Free T4: 1.1 ng/dL (ref 0.8–1.8)

## 2019-04-01 NOTE — Telephone Encounter (Signed)
-----   Message from Delsa Grana, [place] sent at 04/01/2019  3:40 PM EST ----- Please call patient and have him hold his atorvastatin -the lab for his muscles is positive and may be due to the statin medication or may be for another cause want him to not take the atorvastatin or Lipitor want him to push a lot of fluids, and we need to recheck his lab on Friday (repeat CK for elevated CK)  Other labs were good including mag, blood sugar, cholesterol, kidney function, liver function, electrolytes, thyroid, and blood levels.  Iron panel was still a little abnormal.  I want him to take an over the counter iron supplement only 3x a week. Hold his statin, push fluids, and I need to recheck his CK in a few days or earliest next week.

## 2019-04-10 ENCOUNTER — Ambulatory Visit: Payer: Medicare HMO | Admitting: Podiatry

## 2019-04-10 ENCOUNTER — Other Ambulatory Visit: Payer: Self-pay

## 2019-04-10 ENCOUNTER — Encounter: Payer: Self-pay | Admitting: Podiatry

## 2019-04-10 DIAGNOSIS — E1169 Type 2 diabetes mellitus with other specified complication: Secondary | ICD-10-CM | POA: Diagnosis not present

## 2019-04-10 DIAGNOSIS — B351 Tinea unguium: Secondary | ICD-10-CM | POA: Diagnosis not present

## 2019-04-10 DIAGNOSIS — M79676 Pain in unspecified toe(s): Secondary | ICD-10-CM

## 2019-04-10 DIAGNOSIS — E669 Obesity, unspecified: Secondary | ICD-10-CM

## 2019-04-10 NOTE — Progress Notes (Signed)
Complaint:  Visit Type: Patient returns to my office for continued preventative foot care services. Complaint: Patient states" my nails have grown long and thick and become painful to walk and wear shoes" Patient has been diagnosed with DM with no foot complications. The patient presents for preventative foot care services.  Podiatric Exam: Vascular: dorsalis pedis and posterior tibial pulses are palpable bilateral. Capillary return is immediate. Temperature gradient is WNL. Skin turgor WNL  Sensorium: Normal Semmes Weinstein monofilament test. Normal tactile sensation bilaterally. Nail Exam: Pt has thick disfigured discolored nails with subungual debris noted bilateral entire nail hallux through fifth toenails.  Drainage noted lateral border right hallux. Ulcer Exam: There is no evidence of ulcer or pre-ulcerative changes or infection. Orthopedic Exam: Muscle tone and strength are WNL. No limitations in general ROM. No crepitus or effusions noted. Foot type and digits show no abnormalities. Bony prominences are unremarkable. Skin: No Porokeratosis. No infection or ulcers  Diagnosis:  Onychomycosis, , Pain in right toe, pain in left toes  Treatment & Plan Procedures and Treatment: Consent by patient was obtained for treatment procedures.   Debridement of mycotic and hypertrophic toenails, 1 through 5 bilateral and clearing of subungual debris. No ulceration,noted.     Return Visit-Office Procedure: Patient instructed to return to the office for a follow up visit 10 weeks. for continued evaluation and treatment.    Gardiner Barefoot DPM

## 2019-04-14 ENCOUNTER — Other Ambulatory Visit: Payer: Self-pay | Admitting: Psychiatry

## 2019-04-14 DIAGNOSIS — F32A Depression, unspecified: Secondary | ICD-10-CM

## 2019-04-14 DIAGNOSIS — F329 Major depressive disorder, single episode, unspecified: Secondary | ICD-10-CM

## 2019-04-22 ENCOUNTER — Ambulatory Visit: Payer: Medicare HMO

## 2019-05-20 ENCOUNTER — Ambulatory Visit (INDEPENDENT_AMBULATORY_CARE_PROVIDER_SITE_OTHER): Payer: Medicare HMO

## 2019-05-20 ENCOUNTER — Other Ambulatory Visit: Payer: Self-pay

## 2019-05-20 VITALS — BP 112/62 | HR 78 | Temp 97.1°F | Resp 15 | Ht 70.0 in | Wt 254.7 lb

## 2019-05-20 DIAGNOSIS — Z0000 Encounter for general adult medical examination without abnormal findings: Secondary | ICD-10-CM

## 2019-05-20 NOTE — Patient Instructions (Signed)
Duane Hughes , Thank you for taking time to come for your Medicare Wellness Visit. I appreciate your ongoing commitment to your health goals. Please review the following plan we discussed and let me know if I can assist you in the future.   Screening recommendations/referrals: Colonoscopy: done 05/09/16 Recommended yearly ophthalmology/optometry visit for glaucoma screening and checkup Recommended yearly dental visit for hygiene and checkup  Vaccinations: Influenza vaccine: done 01/15/19 Pneumococcal vaccine: done 04/05/16 Tdap vaccine: due Shingles vaccine: Shingrix discussed. Please contact your pharmacy for coverage information.   Conditions/risks identified: Recommend drinking 6-8 glasses of water per day  Next appointment: Please follow up in one year for your Medicare Annual Wellness visit.    Preventive Care 70 Years and Older, Male Preventive care refers to lifestyle choices and visits with your health care provider that can promote health and wellness. What does preventive care include?  A yearly physical exam. This is also called an annual well check.  Dental exams once or twice a year.  Routine eye exams. Ask your health care provider how often you should have your eyes checked.  Personal lifestyle choices, including:  Daily care of your teeth and gums.  Regular physical activity.  Eating a healthy diet.  Avoiding tobacco and drug use.  Limiting alcohol use.  Practicing safe sex.  Taking low doses of aspirin every day.  Taking vitamin and mineral supplements as recommended by your health care provider. What happens during an annual well check? The services and screenings done by your health care provider during your annual well check will depend on your age, overall health, lifestyle risk factors, and family history of disease. Counseling  Your health care provider may ask you questions about your:  Alcohol use.  Tobacco use.  Drug use.  Emotional  well-being.  Home and relationship well-being.  Sexual activity.  Eating habits.  History of falls.  Memory and ability to understand (cognition).  Work and work Statistician. Screening  You may have the following tests or measurements:  Height, weight, and BMI.  Blood pressure.  Lipid and cholesterol levels. These may be checked every 5 years, or more frequently if you are over 64 years old.  Skin check.  Lung cancer screening. You may have this screening every year starting at age 76 if you have a 30-pack-year history of smoking and currently smoke or have quit within the past 15 years.  Fecal occult blood test (FOBT) of the stool. You may have this test every year starting at age 61.  Flexible sigmoidoscopy or colonoscopy. You may have a sigmoidoscopy every 5 years or a colonoscopy every 10 years starting at age 76.  Prostate cancer screening. Recommendations will vary depending on your family history and other risks.  Hepatitis C blood test.  Hepatitis B blood test.  Sexually transmitted disease (STD) testing.  Diabetes screening. This is done by checking your blood sugar (glucose) after you have not eaten for a while (fasting). You may have this done every 1-3 years.  Abdominal aortic aneurysm (AAA) screening. You may need this if you are a current or former smoker.  Osteoporosis. You may be screened starting at age 17 if you are at high risk. Talk with your health care provider about your test results, treatment options, and if necessary, the need for more tests. Vaccines  Your health care provider may recommend certain vaccines, such as:  Influenza vaccine. This is recommended every year.  Tetanus, diphtheria, and acellular pertussis (Tdap, Td) vaccine. You  may need a Td booster every 10 years.  Zoster vaccine. You may need this after age 23.  Pneumococcal 13-valent conjugate (PCV13) vaccine. One dose is recommended after age 76.  Pneumococcal  polysaccharide (PPSV23) vaccine. One dose is recommended after age 19. Talk to your health care provider about which screenings and vaccines you need and how often you need them. This information is not intended to replace advice given to you by your health care provider. Make sure you discuss any questions you have with your health care provider. Document Released: 05/14/2015 Document Revised: 01/05/2016 Document Reviewed: 02/16/2015 Elsevier Interactive Patient Education  2017 [place] Prevention in the Home Falls can cause injuries. They can happen to people of all ages. There are many things you can do to make your home safe and to help prevent falls. What can I do on the outside of my home?  Regularly fix the edges of walkways and driveways and fix any cracks.  Remove anything that might make you trip as you walk through a door, such as a raised step or threshold.  Trim any bushes or trees on the path to your home.  Use bright outdoor lighting.  Clear any walking paths of anything that might make someone trip, such as rocks or tools.  Regularly check to see if handrails are loose or broken. Make sure that both sides of any steps have handrails.  Any raised decks and porches should have guardrails on the edges.  Have any leaves, snow, or ice cleared regularly.  Use sand or salt on walking paths during winter.  Clean up any spills in your garage right away. This includes oil or grease spills. What can I do in the bathroom?  Use night lights.  Install grab bars by the toilet and in the tub and shower. Do not use towel bars as grab bars.  Use non-skid mats or decals in the tub or shower.  If you need to sit down in the shower, use a plastic, non-slip stool.  Keep the floor dry. Clean up any water that spills on the floor as soon as it happens.  Remove soap buildup in the tub or shower regularly.  Attach bath mats securely with double-sided non-slip rug  tape.  Do not have throw rugs and other things on the floor that can make you trip. What can I do in the bedroom?  Use night lights.  Make sure that you have a light by your bed that is easy to reach.  Do not use any sheets or blankets that are too big for your bed. They should not hang down onto the floor.  Have a firm chair that has side arms. You can use this for support while you get dressed.  Do not have throw rugs and other things on the floor that can make you trip. What can I do in the kitchen?  Clean up any spills right away.  Avoid walking on wet floors.  Keep items that you use a lot in easy-to-reach places.  If you need to reach something above you, use a strong step stool that has a grab bar.  Keep electrical cords out of the way.  Do not use floor polish or wax that makes floors slippery. If you must use wax, use non-skid floor wax.  Do not have throw rugs and other things on the floor that can make you trip. What can I do with my stairs?  Do not leave any items  on the stairs.  Make sure that there are handrails on both sides of the stairs and use them. Fix handrails that are broken or loose. Make sure that handrails are as long as the stairways.  Check any carpeting to make sure that it is firmly attached to the stairs. Fix any carpet that is loose or worn.  Avoid having throw rugs at the top or bottom of the stairs. If you do have throw rugs, attach them to the floor with carpet tape.  Make sure that you have a light switch at the top of the stairs and the bottom of the stairs. If you do not have them, ask someone to add them for you. What else can I do to help prevent falls?  Wear shoes that:  Do not have high heels.  Have rubber bottoms.  Are comfortable and fit you well.  Are closed at the toe. Do not wear sandals.  If you use a stepladder:  Make sure that it is fully opened. Do not climb a closed stepladder.  Make sure that both sides of the  stepladder are locked into place.  Ask someone to hold it for you, if possible.  Clearly mark and make sure that you can see:  Any grab bars or handrails.  First and last steps.  Where the edge of each step is.  Use tools that help you move around (mobility aids) if they are needed. These include:  Canes.  Walkers.  Scooters.  Crutches.  Turn on the lights when you go into a dark area. Replace any light bulbs as soon as they burn out.  Set up your furniture so you have a clear path. Avoid moving your furniture around.  If any of your floors are uneven, fix them.  If there are any pets around you, be aware of where they are.  Review your medicines with your doctor. Some medicines can make you feel dizzy. This can increase your chance of falling. Ask your doctor what other things that you can do to help prevent falls. This information is not intended to replace advice given to you by your health care provider. Make sure you discuss any questions you have with your health care provider. Document Released: 02/11/2009 Document Revised: 09/23/2015 Document Reviewed: 05/22/2014 Elsevier Interactive Patient Education  2017 [employer].

## 2019-05-20 NOTE — Progress Notes (Addendum)
Subjective:   Duane Hughes is a 69 y.o. male who presents for Medicare Annual/Subsequent preventive examination.  Review of Systems:   Cardiac Risk Factors include: advanced age (>52men, >41 women);diabetes mellitus;dyslipidemia;male gender;hypertension;obesity (BMI >30kg/m2)     Objective:    Vitals: BP 112/62 (BP Location: Left Arm, Patient Position: Sitting, Cuff Size: Large)   Pulse 78   Temp (!) 97.1 F (36.2 C) (Temporal)   Resp 15   Ht 5\' 10"  (1.778 m)   Wt 254 lb 11.2 oz (115.5 kg)   SpO2 94%   BMI 36.55 kg/m   Body mass index is 36.55 kg/m.  Advanced Directives 05/20/2019 09/24/2018 09/24/2018 09/17/2018 04/18/2018 05/31/2017 05/31/2017  Does Patient Have a Medical Advance Directive? Yes Yes Yes Yes Yes Yes Yes  Type of Paramedic of Uncertain;Living will [place];Living will Living will;Healthcare Power of Attorney Living will;Healthcare Power of Attorney  Does patient want to make changes to medical advance directive? - No - Patient declined No - Patient declined - - No - Patient declined No - Patient declined  Copy of [place] in Chart? Yes - validated most recent copy scanned in chart (See row information) - - - Yes - validated most recent copy scanned in chart (See row information) Yes Yes  Would patient like information on creating a medical advance directive? - - - - - - -    Tobacco Social History   Tobacco Use  Smoking Status Never Smoker  Smokeless Tobacco Never Used     Counseling given: Not Answered   Clinical Intake:  Pre-visit preparation completed: Yes  Pain : No/denies pain     BMI - recorded: 36.55 Nutritional Status: BMI > 30  Obese Nutritional Risks: None Diabetes: Yes CBG done?: No Did pt. bring in CBG monitor from home?: No   Nutrition Risk Assessment:  Has the patient had any N/V/D within the last 2 months?  No  Does the patient have  any non-healing wounds?  No  Has the patient had any unintentional weight loss or weight gain?  No   Diabetes:  Is the patient diabetic?  Yes  If diabetic, was a CBG obtained today?  No  Did the patient bring in their glucometer from home?  No  How often do you monitor your CBG's? Pt does not actively check blood sugar.   Financial Strains and Diabetes Management:  Are you having any financial strains with the device, your supplies or your medication? No .  Does the patient want to be seen by Chronic Care Management for management of their diabetes?  No  Would the patient like to be referred to a Nutritionist or for Diabetic Management?  No   Diabetic Exams:  Diabetic Eye Exam: Completed 12/23/18 negative retinopathy.   Diabetic Foot Exam: Completed 05/30/18.   How often do you need to have someone help you when you read instructions, pamphlets, or other written materials from your doctor or pharmacy?: 1 - Never  Interpreter Needed?: No  Information entered by :: Clemetine Marker LPN  Past Medical History:  Diagnosis Date  . Anxiety    Dr.Jessica Eulas Post . well controlled  . Arthritis    back, hands, knees  . Colon polyp 05/09/2016   TUBULAR ADENOMAS   . Depression   . Diabetes mellitus without complication ([place])    type 2  . Ectatic abdominal aorta ([facility]) 04/18/2016   2.9 cm  on [place] 2016, unchanged 2017  . History of hiatal hernia    20 years ago  . History of skin cancer    hand  . Hyperlipidemia   . Hypertension 2015   Dr. Sanda Klein  . Morbid obesity ([place]) 11/11/2014  . Neuropathy   . Obesity 11/11/2014  . Sleep apnea    cpap   Past Surgical History:  Procedure Laterality Date  . COLONOSCOPY WITH PROPOFOL N/A 05/09/2016   Procedure: COLONOSCOPY WITH PROPOFOL;  Surgeon: Lucilla Lame, MD;  Location: [facility] ENDOSCOPY;  Service: Endoscopy;  Laterality: N/A;  . HERNIA REPAIR     Hiatal  . JOINT REPLACEMENT     04-04-17 Dr. Gladstone Lighter   Right knee  . KNEE ARTHROSCOPY     several  scopes done  . KNEE CLOSED REDUCTION Right 05/31/2017   Procedure: CLOSED MANIPULATION RIGHT KNEE;  Surgeon: Latanya Maudlin, MD;  Location: [facility];  Service: Orthopedics;  Laterality: Right;  Femoral Block  . MEDIAL PARTIAL KNEE REPLACEMENT Left    2000s?  Marland Kitchen SKIN CANCER EXCISION  Feb 2017   removed from right hand  . 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 TOTAL KNEE ARTHROPLASTY Left 09/24/2018   Procedure: REVISION LEFT KNEE ARTHROPLASTY;  Surgeon: Paralee Cancel, MD;  Location: [facility];  Service: Orthopedics;  Laterality: Left;  70 mins   Family History  Problem Relation Age of Onset  . Alcohol abuse Mother   . Rheum arthritis Mother   . Stroke Maternal Grandmother   . Stroke Paternal Grandfather   . Dementia Paternal Grandmother   . Cancer Neg Hx   . Diabetes Neg Hx   . Heart disease Neg Hx   . Hypertension Neg Hx   . Kidney disease Neg Hx   . Prostate cancer Neg Hx    Social History   Socioeconomic History  . Marital status: Married    Spouse name: michele Sanker  . Number of children: 6  . Years of education: Not on file  . Highest education level: Associate degree: academic program  Occupational History    Comment: part time  Tobacco Use  . Smoking status: Never Smoker  . Smokeless tobacco: Never Used  Substance and Sexual Activity  . Alcohol use: No    Alcohol/week: 0.0 standard drinks  . Drug use: No  . Sexual activity: Yes    Partners: Female  Other Topics Concern  . Not on file  Social History Narrative  . Not on file   Social Determinants of Health   Financial Resource Strain: Low Risk   . Difficulty of Paying Living Expenses: Not hard at all  Food Insecurity: No Food Insecurity  . Worried About Charity fundraiser in the Last Year: Never true  . Ran Out of Food in the Last Year: Never true  Transportation Needs:  No Transportation Needs  . Lack of Transportation (Medical): No  . Lack of Transportation (Non-Medical): No  Physical Activity: Sufficiently Active  . Days of Exercise per Week: 7 days  . Minutes of Exercise per Session: 60 min  Stress: No Stress Concern Present  . Feeling of Stress : Only a little  Social Connections: Not Isolated  . Frequency of Communication with Friends and Family: More than three times a week  . Frequency of Social Gatherings with Friends and Family: Three times a week  .  Attends Religious Services: More than 4 times per year  . Active Member of Clubs or Organizations: Yes  . Attends Archivist Meetings: 1 to 4 times per year  . Marital Status: Married    Outpatient Encounter Medications as of 05/20/2019  Medication Sig  . aspirin EC 81 MG tablet Take 81 mg by mouth daily.  Marland Kitchen buPROPion (WELLBUTRIN SR) 150 MG 12 hr tablet TAKE 1 TABLET BY MOUTH TWICE A DAY  . hydrocortisone 2.5 % cream Apply 1 application topically daily as needed (rash).  Marland Kitchen ketoconazole (NIZORAL) 2 % cream Apply 1 application topically daily as needed (rash).  Marland Kitchen lisinopril (ZESTRIL) 5 MG tablet TAKE 0.5 TABLETS (2.5 MG TOTAL) BY MOUTH DAILY.  . metFORMIN (GLUCOPHAGE) 500 MG tablet Take 2 tablets with breakfast and 1 tablet with dinner daily.  . sildenafil (REVATIO) 20 MG tablet Take 3 to 5 tablets two hours before intercouse on an empty stomach.  Do not take with nitrates.  . Turmeric 500 MG CAPS Take 500 mg by mouth daily.   Marland Kitchen lamoTRIgine (LAMICTAL) 200 MG tablet Take 1 tablet (200 mg total) by mouth daily.  . Omega-3 Fatty Acids (FISH OIL) 1200 MG CAPS Take 1,200 mg by mouth daily.  . pramipexole (MIRAPEX) 0.5 MG tablet Take 1 tablet (0.5 mg total) by mouth 2 (two) times a day.  . [DISCONTINUED] atorvastatin (LIPITOR) 10 MG tablet TAKE 1 TABLET (10 MG TOTAL) BY MOUTH AT BEDTIME. (Patient not taking: Reported on 05/20/2019)   No facility-administered encounter medications on file as of  05/20/2019.    Activities of Daily Living In your present state of health, do you have any difficulty performing the following activities: 05/20/2019 03/31/2019  Hearing? N Y  Comment wears hearing aids -  Vision? N N  Difficulty concentrating or making decisions? N N  Walking or climbing stairs? N N  Dressing or bathing? N N  Doing errands, shopping? N N  Preparing Food and eating ? N -  Using the Toilet? N -  In the past six months, have you accidently leaked urine? N -  Do you have problems with loss of bowel control? N -  Managing your Medications? N -  Managing your Finances? N -  Housekeeping or managing your Housekeeping? N -  Some recent data might be hidden    Patient Care Team: Delsa Grana, PA-C as PCP - General (Family Medicine) Ralene Bathe, MD (Dermatology) Laneta Simmers as Physician Assistant (Urology) Bary Castilla, Forest Gleason, MD (General Surgery) Latanya Maudlin, MD as Consulting Physician (Orthopedic Surgery) Vern Claude, [place] as Social Worker   Assessment:   This is a routine wellness examination for Nadarius.  Exercise Activities and Dietary recommendations Current Exercise Habits: Home exercise routine, Type of exercise: walking, Time (Minutes): 60, Frequency (Times/Week): 7, Weekly Exercise (Minutes/Week): 420, Intensity: Moderate, Exercise limited by: orthopedic condition(s);neurologic condition(s)  Goals    . DIET - INCREASE WATER INTAKE     Recommend drinking 6-8 glasses of water per day    . Weight (lb) < 250 lb (113.4 kg)     Pt would like to lose 10 lbs over the next year       Fall Risk Fall Risk  05/20/2019 03/31/2019 11/29/2018 10/23/2018 09/10/2018  Falls in the past year? 0 0 0 0 0  Comment - - - - -  Number falls in past yr: 0 0 0 0 -  Injury with Fall? 0 0 0 0 -  Risk for fall due to : Impaired balance/gait - - - -  Follow up Falls prevention discussed - - - -   FALL RISK PREVENTION PERTAINING TO THE HOME:  Any stairs  in or around the home? Yes  If so, do they handrails? Yes   Home free of loose throw rugs in walkways, pet beds, electrical cords, etc? Yes  Adequate lighting in your home to reduce risk of falls? Yes   ASSISTIVE DEVICES UTILIZED TO PREVENT FALLS:  Life alert? No  Use of a cane, walker or w/c? No  Grab bars in the bathroom? No  Shower chair or bench in shower? No  Elevated toilet seat or a handicapped toilet? Yes   DME ORDERS:  DME order needed?  No   TIMED UP AND GO:  Was the test performed? Yes .  Length of time to ambulate 10 feet: 6 sec.   GAIT:  Appearance of gait: Gait stead-fast and without the use of an assistive device.   Education: Fall risk prevention has been discussed.  Intervention(s) required? No   Depression Screen PHQ 2/9 Scores 05/20/2019 05/20/2019 03/31/2019 11/29/2018  PHQ - 2 Score 1 0 0 0  PHQ- 9 Score 3 - 0 0    Cognitive Function 6CIT deferred for 2021 AWV, pt has no memory issues.      6CIT Screen 04/18/2018  What Year? 0 points  What month? 0 points  What time? 0 points  Count back from 20 0 points  Months in reverse 0 points  Repeat phrase 0 points  Total Score 0    Immunization History  Administered Date(s) Administered  . Fluad Quad(high Dose 65+) 01/15/2019  . Influenza, High Dose Seasonal PF 02/24/2016  . Influenza,inj,Quad PF,6+ Mos 03/18/2015, 02/28/2018  . Pneumococcal Conjugate-13 12/17/2014  . Pneumococcal Polysaccharide-23 04/05/2016  . Zoster 05/01/2009    Qualifies for Shingles Vaccine? Yes  Zostavax completed 2011. Due for Shingrix. Education has been provided regarding the importance of this vaccine. Pt has been advised to call insurance company to determine out of pocket expense. Advised may also receive vaccine at local pharmacy or Health Dept. Verbalized acceptance and understanding.  Tdap: Although this vaccine is not a covered service during a Wellness Exam, does the patient still wish to receive this vaccine  today?  No .  Education has been provided regarding the importance of this vaccine. Advised may receive this vaccine at local pharmacy or Health Dept. Aware to provide a copy of the vaccination record if obtained from local pharmacy or Health Dept. Verbalized acceptance and understanding.  Flu Vaccine: Up to date  Pneumococcal Vaccine: Up to date   Screening Tests Health Maintenance  Topic Date Due  . FOOT EXAM  05/31/2019  . HEMOGLOBIN A1C  09/28/2019  . OPHTHALMOLOGY EXAM  12/23/2019  . COLONOSCOPY  05/09/2021  . TETANUS/TDAP  03/01/2028  . INFLUENZA VACCINE  Completed  . Hepatitis C Screening  Completed  . PNA vac Low Risk Adult  Completed   Cancer Screenings:  Colorectal Screening: Completed 05/09/16. Repeat every 5 years;   Lung Cancer Screening: (Low Dose CT Chest recommended if Age 7-80 years, 30 pack-year currently smoking OR have quit w/in 15years.) does not qualify.   Additional Screening:  Hepatitis C Screening: does qualify; Completed 09/19/16  Vision Screening: Recommended annual ophthalmology exams for early detection of glaucoma and other disorders of the eye. Is the patient up to date with their annual eye exam?  Yes  Who is the  provider or what is the name of the office in which the pt attends annual eye exams? [place] Screening: Recommended annual dental exams for proper oral hygiene  Community Resource Referral:  CRR required this visit?  No       Plan:    I have personally reviewed and addressed the Medicare Annual Wellness questionnaire and have noted the following in the patient's chart:  A. Medical and social history B. Use of alcohol, tobacco or illicit drugs  C. Current medications and supplements D. Functional ability and status E.  Nutritional status F.  Physical activity G. Advance directives H. List of other physicians I.  Hospitalizations, surgeries, and ER visits in previous 12 months J.  [place] such as  hearing and vision if needed, cognitive and depression L. Referrals and appointments   In addition, I have reviewed and discussed with patient certain preventive protocols, quality metrics, and best practice recommendations. A written personalized care plan for preventive services as well as general preventive health recommendations were provided to patient.   Signed,  Clemetine Marker, LPN  Nurse Health Advisor   Nurse Notes: information provided to patient for Covid-19 vaccine through [facility].

## 2019-06-05 DIAGNOSIS — Z85828 Personal history of other malignant neoplasm of skin: Secondary | ICD-10-CM | POA: Diagnosis not present

## 2019-06-05 DIAGNOSIS — L814 Other melanin hyperpigmentation: Secondary | ICD-10-CM | POA: Diagnosis not present

## 2019-06-05 DIAGNOSIS — L570 Actinic keratosis: Secondary | ICD-10-CM | POA: Diagnosis not present

## 2019-06-05 DIAGNOSIS — L821 Other seborrheic keratosis: Secondary | ICD-10-CM | POA: Diagnosis not present

## 2019-06-05 DIAGNOSIS — Z86018 Personal history of other benign neoplasm: Secondary | ICD-10-CM | POA: Diagnosis not present

## 2019-06-05 DIAGNOSIS — Z1283 Encounter for screening for malignant neoplasm of skin: Secondary | ICD-10-CM | POA: Diagnosis not present

## 2019-06-05 DIAGNOSIS — D1801 Hemangioma of skin and subcutaneous tissue: Secondary | ICD-10-CM | POA: Diagnosis not present

## 2019-06-05 DIAGNOSIS — L820 Inflamed seborrheic keratosis: Secondary | ICD-10-CM | POA: Diagnosis not present

## 2019-06-13 DIAGNOSIS — Z23 Encounter for immunization: Secondary | ICD-10-CM | POA: Diagnosis not present

## 2019-06-19 ENCOUNTER — Ambulatory Visit: Payer: Medicare HMO | Admitting: Podiatry

## 2019-06-23 ENCOUNTER — Ambulatory Visit: Payer: Medicare HMO | Admitting: Podiatry

## 2019-06-23 ENCOUNTER — Encounter: Payer: Self-pay | Admitting: Podiatry

## 2019-06-23 ENCOUNTER — Other Ambulatory Visit: Payer: Self-pay

## 2019-06-23 DIAGNOSIS — E1169 Type 2 diabetes mellitus with other specified complication: Secondary | ICD-10-CM | POA: Diagnosis not present

## 2019-06-23 DIAGNOSIS — E669 Obesity, unspecified: Secondary | ICD-10-CM

## 2019-06-23 DIAGNOSIS — B351 Tinea unguium: Secondary | ICD-10-CM

## 2019-06-23 DIAGNOSIS — M79676 Pain in unspecified toe(s): Secondary | ICD-10-CM | POA: Diagnosis not present

## 2019-06-23 NOTE — Progress Notes (Signed)
Complaint:  Visit Type: Patient returns to my office for continued preventative foot care services. Complaint: Patient states" my nails have grown long and thick and become painful to walk and wear shoes" Patient has been diagnosed with DM with no foot complications. The patient presents for preventative foot care services.  Podiatric Exam: Vascular: dorsalis pedis and posterior tibial pulses are palpable bilateral. Capillary return is immediate. Temperature gradient is WNL. Skin turgor WNL  Sensorium: Normal Semmes Weinstein monofilament test. Normal tactile sensation bilaterally. Nail Exam: Pt has thick disfigured discolored nails with subungual debris noted bilateral entire nail hallux through fifth toenails.  Drainage noted lateral border right hallux. Ulcer Exam: There is no evidence of ulcer or pre-ulcerative changes or infection. Orthopedic Exam: Muscle tone and strength are WNL. No limitations in general ROM. No crepitus or effusions noted. Foot type and digits show no abnormalities. Bony prominences are unremarkable. Skin: No Porokeratosis. No infection or ulcers  Diagnosis:  Onychomycosis, , Pain in right toe, pain in left toes  Treatment & Plan Procedures and Treatment: Consent by patient was obtained for treatment procedures.   Debridement of mycotic and hypertrophic toenails, 1 through 5 bilateral and clearing of subungual debris. No ulceration,noted.     Return Visit-Office Procedure: Patient instructed to return to the office for a follow up visit 10 weeks. for continued evaluation and treatment.    Gardiner Barefoot DPM

## 2019-06-26 ENCOUNTER — Other Ambulatory Visit: Payer: Self-pay | Admitting: Family Medicine

## 2019-06-26 DIAGNOSIS — E1169 Type 2 diabetes mellitus with other specified complication: Secondary | ICD-10-CM

## 2019-06-26 MED ORDER — METFORMIN HCL 500 MG PO TABS: ORAL_TABLET | ORAL | 3 refills | Status: DC

## 2019-06-26 NOTE — Telephone Encounter (Signed)
Pt need a refill on metformin 500 mg 90 day supply send to [facility] in [place]

## 2019-06-26 NOTE — Telephone Encounter (Signed)
Last visit for DM:11/20  Last lab: A1c: 6.5

## 2019-07-11 DIAGNOSIS — Z23 Encounter for immunization: Secondary | ICD-10-CM | POA: Diagnosis not present

## 2019-08-11 ENCOUNTER — Other Ambulatory Visit: Payer: Self-pay | Admitting: Family Medicine

## 2019-09-01 ENCOUNTER — Ambulatory Visit: Payer: Medicare HMO | Admitting: Podiatry

## 2019-09-01 ENCOUNTER — Other Ambulatory Visit: Payer: Self-pay

## 2019-09-01 ENCOUNTER — Encounter: Payer: Self-pay | Admitting: Podiatry

## 2019-09-01 DIAGNOSIS — M79676 Pain in unspecified toe(s): Secondary | ICD-10-CM | POA: Diagnosis not present

## 2019-09-01 DIAGNOSIS — E669 Obesity, unspecified: Secondary | ICD-10-CM

## 2019-09-01 DIAGNOSIS — E1169 Type 2 diabetes mellitus with other specified complication: Secondary | ICD-10-CM | POA: Diagnosis not present

## 2019-09-01 DIAGNOSIS — B351 Tinea unguium: Secondary | ICD-10-CM | POA: Diagnosis not present

## 2019-09-01 NOTE — Progress Notes (Signed)
This patient returns to my office for at risk foot care.  This patient requires this care by a professional since this patient will be at risk due to having diabetes.  This patient is unable to cut nails himself since the patient cannot reach his nails.These nails are painful walking and wearing shoes.  This patient presents for at risk foot care today.  General Appearance  Alert, conversant and in no acute stress.  Vascular  Dorsalis pedis and posterior tibial  pulses are palpable  bilaterally.  Capillary return is within normal limits  bilaterally. Temperature is within normal limits  bilaterally.  Neurologic  Senn-Weinstein monofilament wire test within normal limits  bilaterally. Muscle power within normal limits bilaterally.  Nails Thick disfigured discolored nails with subungual debris  from hallux to fifth toes bilaterally. No evidence of bacterial infection or drainage bilaterally.  Orthopedic  No limitations of motion  feet .  No crepitus or effusions noted.  No bony pathology or digital deformities noted.  Skin  normotropic skin with no porokeratosis noted bilaterally.  No signs of infections or ulcers noted.     Onychomycosis  Pain in right toes  Pain in left toes  Consent was obtained for treatment procedures.   Mechanical debridement of nails 1-5  bilaterally performed with a nail nipper.  Filed with dremel without incident.    Return office visit  10 weeks                    Told patient to return for periodic foot care and evaluation due to potential at risk complications.   Carolanne Mercier DPM   

## 2019-09-03 ENCOUNTER — Other Ambulatory Visit: Payer: Self-pay

## 2019-09-03 ENCOUNTER — Ambulatory Visit: Payer: Medicare HMO | Admitting: Dermatology

## 2019-09-03 ENCOUNTER — Encounter: Payer: Self-pay | Admitting: Dermatology

## 2019-09-03 DIAGNOSIS — L578 Other skin changes due to chronic exposure to nonionizing radiation: Secondary | ICD-10-CM | POA: Diagnosis not present

## 2019-09-03 DIAGNOSIS — Z86007 Personal history of in-situ neoplasm of skin: Secondary | ICD-10-CM

## 2019-09-03 DIAGNOSIS — L821 Other seborrheic keratosis: Secondary | ICD-10-CM | POA: Diagnosis not present

## 2019-09-03 DIAGNOSIS — Z1283 Encounter for screening for malignant neoplasm of skin: Secondary | ICD-10-CM

## 2019-09-03 DIAGNOSIS — D692 Other nonthrombocytopenic purpura: Secondary | ICD-10-CM

## 2019-09-03 DIAGNOSIS — D1800 Hemangioma unspecified site: Secondary | ICD-10-CM

## 2019-09-03 DIAGNOSIS — L570 Actinic keratosis: Secondary | ICD-10-CM | POA: Diagnosis not present

## 2019-09-03 DIAGNOSIS — Z85828 Personal history of other malignant neoplasm of skin: Secondary | ICD-10-CM | POA: Diagnosis not present

## 2019-09-03 DIAGNOSIS — L219 Seborrheic dermatitis, unspecified: Secondary | ICD-10-CM

## 2019-09-03 NOTE — Progress Notes (Signed)
   Follow-Up Visit   Subjective  Duane Hughes is a 69 y.o. male who presents for the following: Actinic Keratosis (8m f/u face, hands, arms).   The following portions of the chart were reviewed this encounter and updated as appropriate:  Tobacco  Allergies  Meds  Problems  Med Hx  Surg Hx  Fam Hx      Review of Systems:  No other skin or systemic complaints except as noted in HPI or Assessment and Plan.  Objective  Well appearing patient in no apparent distress; mood and affect are within normal limits.  All skin waist up examined.  Objective  Right Forearm x 2, face x 6 (8): Pink scaly macules   Objective  R dorsum hand: Well healed scar with no evidence of recurrence, no lymphadenopathy.   Objective  R dorsum wrist: Well healed scar with no evidence of recurrence.   Objective  face: Pink patches with greasy scale.    Assessment & Plan   Skin cancer screening performed today.   Actinic Damage - diffuse scaly erythematous macules with underlying dyspigmentation - Recommend daily broad spectrum sunscreen SPF 30+ to sun-exposed areas, reapply every 2 hours as needed.  - Call for new or changing lesions.  Seborrheic Keratoses - Stuck-on, waxy, tan-brown papules and plaques  - Discussed benign etiology and prognosis. - Observe - Call for any changes  Hemangiomas - Red papules - Discussed benign nature - Observe - Call for any changes  Purpura - Violaceous macules and patches - Benign - Related to age, sun damage and/or use of blood thinners - Observe - Can use OTC arnica containing moisturizer such as Dermend Bruise Formula if desired - Call for worsening or other concerns   AK (actinic keratosis) (8) Right Forearm x 2, face x 6  Destruction of lesion - Right Forearm x 2, face x 6 Complexity: simple   Destruction method: cryotherapy   Informed consent: discussed and consent obtained   Timeout:  patient name, date of birth, surgical site, and  procedure verified Lesion destroyed using liquid nitrogen: Yes   Region frozen until ice ball extended beyond lesion: Yes   Outcome: patient tolerated procedure well with no complications   Post-procedure details: wound care instructions given    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 Observe for recurrence. Call clinic for new or changing lesions.  Recommend regular skin exams, daily broad-spectrum spf 30+ sunscreen use, and photoprotection.     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 Observe for recurrence. Call clinic for new or changing lesions.  Recommend regular skin exams, daily broad-spectrum spf 30+ sunscreen use, and photoprotection.     Seborrheic dermatitis face  Cont Ketoconazole 2% cr qd prn flares Cont HC 2.5% cr qd up to 4d/wk prn flares  Return in about 1 year (around 09/02/2020) for TBSE.  I, Othelia Pulling, RMA, am acting as scribe for Sarina Ser, MD .  Documentation: I have reviewed the above documentation for accuracy and completeness, and I agree with the above.  Sarina Ser, MD

## 2019-09-04 ENCOUNTER — Encounter: Payer: Self-pay | Admitting: Family Medicine

## 2019-09-04 ENCOUNTER — Other Ambulatory Visit: Payer: Self-pay | Admitting: Psychiatry

## 2019-09-04 ENCOUNTER — Ambulatory Visit (INDEPENDENT_AMBULATORY_CARE_PROVIDER_SITE_OTHER): Payer: Medicare HMO | Admitting: Family Medicine

## 2019-09-04 VITALS — BP 152/82 | HR 71 | Temp 97.9°F | Resp 14 | Ht 68.0 in | Wt 257.0 lb

## 2019-09-04 DIAGNOSIS — E669 Obesity, unspecified: Secondary | ICD-10-CM | POA: Diagnosis not present

## 2019-09-04 DIAGNOSIS — E1142 Type 2 diabetes mellitus with diabetic polyneuropathy: Secondary | ICD-10-CM | POA: Diagnosis not present

## 2019-09-04 DIAGNOSIS — E785 Hyperlipidemia, unspecified: Secondary | ICD-10-CM | POA: Diagnosis not present

## 2019-09-04 DIAGNOSIS — R748 Abnormal levels of other serum enzymes: Secondary | ICD-10-CM | POA: Diagnosis not present

## 2019-09-04 DIAGNOSIS — I10 Essential (primary) hypertension: Secondary | ICD-10-CM

## 2019-09-04 DIAGNOSIS — M2550 Pain in unspecified joint: Secondary | ICD-10-CM | POA: Diagnosis not present

## 2019-09-04 DIAGNOSIS — E611 Iron deficiency: Secondary | ICD-10-CM

## 2019-09-04 DIAGNOSIS — R700 Elevated erythrocyte sedimentation rate: Secondary | ICD-10-CM | POA: Diagnosis not present

## 2019-09-04 DIAGNOSIS — E538 Deficiency of other specified B group vitamins: Secondary | ICD-10-CM | POA: Diagnosis not present

## 2019-09-04 DIAGNOSIS — M79604 Pain in right leg: Secondary | ICD-10-CM | POA: Diagnosis not present

## 2019-09-04 DIAGNOSIS — R2689 Other abnormalities of gait and mobility: Secondary | ICD-10-CM | POA: Diagnosis not present

## 2019-09-04 DIAGNOSIS — F32A Depression, unspecified: Secondary | ICD-10-CM

## 2019-09-04 DIAGNOSIS — G5793 Unspecified mononeuropathy of bilateral lower limbs: Secondary | ICD-10-CM | POA: Diagnosis not present

## 2019-09-04 DIAGNOSIS — M79605 Pain in left leg: Secondary | ICD-10-CM

## 2019-09-04 DIAGNOSIS — E559 Vitamin D deficiency, unspecified: Secondary | ICD-10-CM | POA: Diagnosis not present

## 2019-09-04 DIAGNOSIS — E1169 Type 2 diabetes mellitus with other specified complication: Secondary | ICD-10-CM | POA: Diagnosis not present

## 2019-09-04 DIAGNOSIS — M791 Myalgia, unspecified site: Secondary | ICD-10-CM

## 2019-09-04 DIAGNOSIS — F329 Major depressive disorder, single episode, unspecified: Secondary | ICD-10-CM

## 2019-09-04 MED ORDER — METFORMIN HCL 500 MG PO TABS: ORAL_TABLET | ORAL | 3 refills | Status: DC

## 2019-09-04 MED ORDER — LISINOPRIL 5 MG PO TABS: 2.5000 mg | ORAL_TABLET | Freq: Every day | ORAL | 3 refills | Status: DC

## 2019-09-04 NOTE — Telephone Encounter (Signed)
Last apt 10/2018 was due back at 6 months

## 2019-09-04 NOTE — Patient Instructions (Signed)
[facility]

## 2019-09-04 NOTE — Progress Notes (Signed)
Patient ID: Duane Hughes, male    DOB: 07/10/1950, 69 y.o.   MRN: [id number]  PCP: Delsa Grana, PA-C  Chief Complaint  Patient presents with  . Follow-up  . Hypertension    refills  . Diabetes    neuropathy worse    Subjective:   Duane Hughes is a 69 y.o. male, presents to clinic with CC of the following:  HPI  Pt presents for f/up but he had multiple abnormal findings with last routine visit and did not return for f/up in 3 weeks as instructed  CK was elevated to 617, he was instructed to stop a statin medication and to return for repeat labs to see if it was trending down but he never followed up in clinic, he did stop a statin but his symptoms have not changed at all.  He does have well-controlled diabetes with history of peripheral neuropathy-he has seen multiple specialist for this and has had extensive work-ups for this as well including extensive lab work, EMG.  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  He does have a history of bilateral low back pain with sciatica many years ago He also has a history of ectatic abdominal aorta seen by vascular specialist history of dyslipidemia   Patient is here for his routine follow-up but he has multiple problems which will be addressed as noted above, abnormal labs found with his last routine office visit where he did not follow-up and was not compliant, continued multiple complaints about other conditions which have been fairly chronic -worked up and managed by last PCP and multiple specialists, does not seem that he has followed up with any of the specialist in at least 5 to 7 months  Will need to obtain labs and do subsequent follow-up for his routine conditions including his hypertension, hyperlipidemia, diabetes Hypertension:  Patient is on lisinopril 5 mg, blood pressure usually well controlled in low end of normal today elevated but is slightly upset in a lot of pain BP Readings from Last 3 Encounters:  09/04/19 (!) 152/82  05/20/19 112/62  03/31/19 128/80  At home his blood pressure earlier today was 127/81  HLD -asked to 6 months ago to hold his statin medication because of elevated CK and to follow-up but he did not do that we will recheck his lipid panel today  DM-patient is on Metformin 1000 mg in the morning and 500 mg  in the evening    [facility] on lamictal and wellbutrin and mirapex prescribed by Psych NP for moods?  Pt isn't sure dx.  He has a diagnosis of recurrent major depressive disorder but no other noted mood disorders Depression screen [facility] 2/9 09/04/2019 05/20/2019 05/20/2019  Decreased Interest 0 0 0  Down, Depressed, Hopeless 0 1 0  PHQ - 2 Score 0 1 0  Altered sleeping 0 1 -  Tired, decreased energy 0 1 -  Change in appetite 0 0 -  Feeling bad or failure about yourself  0 0 -  Trouble concentrating 0 0 -  Moving slowly or  fidgety/restless 0 0 -  Suicidal thoughts 0 - -  PHQ-9 Score 0 3 -  Difficult doing work/chores - Not difficult at all -  Some recent data might be hidden   PHQ 9 reviewed and negative today   Patient Active Problem List   Diagnosis Date Noted  . Diabetic polyneuropathy associated with type 2 diabetes mellitus ([place]) 03/05/2019  . Paronychia of great toe of right foot 01/30/2019  . Status post total left knee replacement 09/24/2018  . Abdominal aortic aneurysm ([place]) 09/24/2018  . Peripheral neuropathy 10/29/2017  . Hx of total knee arthroplasty, right 04/04/2017  . LAE (left atrial enlargement) 03/08/2017  . Right atrial enlargement 03/08/2017  . Vitamin D deficiency 11/21/2016  . Hx of hypogonadism 11/17/2016  . Lymphadenopathy, axillary 06/06/2016  . Benign neoplasm of ascending colon   . Bilateral low back pain with bilateral sciatica 03/26/2016  . 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 Dyslipidemia 06/22/2015  . Diabetes mellitus type 2 in obese ([place]) 03/19/2015  . Obstructive sleep apnea 03/18/2015  . Controlled substance agreement signed 03/18/2015  . Drug-induced erectile dysfunction 11/13/2014  . Morbid obesity ([place]) 11/11/2014  . Major depressive disorder, recurrent ([place]) 11/11/2014  . Benign hypertension 11/11/2014  . Enlarged prostate 11/11/2014      Current Outpatient Medications:  .  aspirin EC 81 MG tablet, Take 81 mg by mouth daily., Disp: , Rfl:  .  buPROPion (WELLBUTRIN SR) 150 MG 12 hr tablet, TAKE 1 TABLET BY MOUTH TWICE A DAY, Disp: 180 tablet, Rfl: 0 .  hydrocortisone 2.5 % cream, Apply 1 application topically daily as needed (rash)., Disp: , Rfl:  .  ketoconazole (NIZORAL) 2 % cream, Apply 1 application topically daily as needed (rash)., Disp: , Rfl:  .  lisinopril (ZESTRIL) 5 MG tablet, TAKE 0.5 TABLETS (2.5 MG TOTAL) BY MOUTH DAILY., Disp: 45 tablet, Rfl: 1 .  metFORMIN (GLUCOPHAGE) 500 MG tablet, Take 2 tablets with  breakfast and 1 tablet with dinner daily., Disp: 270 tablet, Rfl: 3 .  Omega-3 Fatty Acids (FISH OIL) 1200 MG CAPS, Take 1,200 mg by mouth daily., Disp: , Rfl:  .  sildenafil (REVATIO) 20 MG tablet, Take 3 to 5 tablets two hours before intercouse on an empty stomach.  Do not take with nitrates., Disp: 50 tablet, Rfl: 3 .  lamoTRIgine (LAMICTAL) 200 MG tablet, Take 1 tablet (200 mg total) by mouth daily., Disp: 90 tablet, Rfl: 1 .  pramipexole (MIRAPEX) 0.5 MG tablet, Take 1 tablet (0.5 mg total) by mouth 2 (two) times a day., Disp: 180 tablet, Rfl: 1   No Known Allergies   Family History  Problem Relation Age of Onset  . Alcohol abuse Mother   . Rheum arthritis Mother   . Stroke Maternal Grandmother   . Stroke Paternal Grandfather   . Dementia Paternal  Grandmother   . Cancer Neg Hx   . Diabetes Neg Hx   . Heart disease Neg Hx   . Hypertension Neg Hx   . Kidney disease Neg Hx   . Prostate cancer Neg Hx      Social History   Socioeconomic History  . Marital status: Married    Spouse name: michele Kinker  . Number of children: 6  . Years of education: Not on file  . Highest education level: Associate degree: academic program  Occupational History    Comment: part time  Tobacco Use  . Smoking status: Never Smoker  . Smokeless tobacco: Never Used  Substance and Sexual Activity  . Alcohol use: No    Alcohol/week: 0.0 standard drinks  . Drug use: No  . Sexual activity: Yes    Partners: Female  Other Topics Concern  . 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 Difficulty of Paying Living Expenses: Not hard at all  Food Insecurity: No Food Insecurity  . Worried About Charity fundraiser in the Last Year: Never true  . Ran Out of Food in the Last Year: Never true  Transportation Needs: No Transportation Needs  . Lack of Transportation (Medical): No  .  Lack of Transportation (Non-Medical): No  Physical Activity: Sufficiently Active  . Days of Exercise per Week: 7 days  . Minutes of Exercise per Session: 60 min  Stress: No Stress Concern Present  . Feeling of Stress : Only a little  Social Connections: Not Isolated  . Frequency of Communication with Friends and Family: More than three times a week  . Frequency of Social Gatherings with Friends and Family: Three times a week  . Attends Religious Services: More than 4 times per year  . Active Member of Clubs or Organizations: Yes  . Attends Archivist Meetings: 1 to 4 times per year  . Marital Status: Married  Human resources officer Violence: Not At Risk  . Fear of Current or Ex-Partner: No  . Emotionally Abused: No  . Physically Abused: No  . Sexually Abused: No    Chart Review Today: I personally reviewed active problem list, medication list, allergies, family history, social history, health maintenance, notes from last encounter, lab results, imaging with the patient/caregiver today.   Review of Systems 10 Systems reviewed and are negative for acute change except as noted in the HPI.     Objective:   Vitals:   09/04/19 1359  BP: (!) 152/82  Pulse: 71  Resp: 14  Temp: 97.9 F (36.6 C)  SpO2: 98%  Weight: 257 lb (116.6 kg)  Height: 5\' 8"  (1.727 m)    Body mass index is 39.08 kg/m.  Physical Exam Vitals and nursing note reviewed.  Constitutional:      General: He is not in acute distress.    Appearance: Normal appearance. He is well-developed. He is obese. He is not ill-appearing, toxic-appearing or diaphoretic.     Interventions: Face mask in place.  HENT:     Head: Normocephalic and atraumatic.     Jaw: No trismus.     Right Ear: External ear normal.     Left Ear: External ear normal.  Eyes:     General: Lids are normal. No scleral icterus.    Conjunctiva/sclera: Conjunctivae normal.     Pupils: Pupils are  equal, round, and reactive to light.  Neck:      Trachea: Trachea and phonation normal. No tracheal deviation.  Cardiovascular:     Rate and Rhythm: Normal rate and regular rhythm.     Pulses: Normal pulses.          Radial pulses are 2+ on the right side and 2+ on the left side.     Heart sounds: Normal heart sounds. No murmur. No friction rub. No gallop.      Comments: Bilateral LE, appears symmetrical, scant leg hair, slightly shiny appearance to skin that is fair to pale, but normal appearing for him, DP pulses b/l not easily palpated on exam, but legs ankles warm to the touch, no pallor Pulmonary:     Effort: Pulmonary effort is normal. No respiratory distress.     Breath sounds: Normal breath sounds. No stridor. No wheezing, rhonchi or rales.  Abdominal:     General: Bowel sounds are normal. There is no distension.     Palpations: Abdomen is soft.     Tenderness: There is no abdominal tenderness. There is no guarding or rebound.  Musculoskeletal:        General: Normal range of motion.     Cervical back: Normal range of motion and neck supple.     Right lower leg: Edema present.     Left lower leg: Edema present.  Skin:    General: Skin is warm and dry.     Capillary Refill: Capillary refill takes less than 2 seconds.     Coloration: Skin is not jaundiced or pale.     Findings: No bruising, erythema or rash.     Nails: There is no clubbing.  Neurological:     Mental Status: He is alert.     Cranial Nerves: No dysarthria or facial asymmetry.     Motor: No tremor or abnormal muscle tone.     Gait: Gait normal.  Psychiatric:        Attention and Perception: Attention normal.        Mood and Affect: Mood normal.        Speech: Speech normal.        Behavior: Behavior normal. Behavior is cooperative.      Results for orders placed or performed in visit on 03/31/19  CK (Creatine Kinase)  Result Value Ref Range   Total CK 617 (H) 44 - 196 U/L  CBC with Differential/Platelet  Result Value Ref Range   WBC 4.7 3.8 - 10.8  Thousand/uL   RBC 4.55 4.20 - 5.80 Million/uL   Hemoglobin 13.8 13.2 - 17.1 g/dL   HCT 41.5 38.5 - 50.0 %   MCV 91.2 80.0 - 100.0 fL   MCH 30.3 27.0 - 33.0 pg   MCHC 33.3 32.0 - 36.0 g/dL   RDW 13.5 11.0 - 15.0 %   Platelets 252 140 - 400 Thousand/uL   MPV 9.1 7.5 - 12.5 fL   Neutro Abs 3,144 1,500 - 7,800 cells/uL   Lymphs Abs 823 (L) 850 - 3,900 cells/uL   Absolute Monocytes 357 200 - 950 cells/uL   Eosinophils Absolute 338 15 - 500 cells/uL   Basophils Absolute 38 0 - 200 cells/uL   Neutrophils Relative % 66.9 %   Total Lymphocyte 17.5 %   Monocytes Relative 7.6 %   Eosinophils Relative 7.2 %   Basophils Relative 0.8 %  Iron, TIBC and Ferritin Panel  Result Value Ref Range   Iron 90 50 -  180 mcg/dL   TIBC 383 250 - 425 mcg/dL (calc)   %SAT 23 20 - 48 % (calc)   Ferritin 20 (L) 24 - 380 ng/mL  PTH  Result Value Ref Range   PTH 39 14 - 64 pg/mL  T4, Free  Result Value Ref Range   Free T4 1.1 0.8 - 1.8 ng/dL  TSH  Result Value Ref Range   TSH 1.99 0.40 - 4.50 mIU/L  CMP w GFR  Result Value Ref Range   Glucose, Bld 94 65 - 99 mg/dL   BUN 14 7 - 25 mg/dL   Creat 0.76 0.70 - 1.25 mg/dL   GFR, Est Non African American 93 > OR = 60 mL/min/1.77m2   GFR, Est African American 108 > OR = 60 mL/min/1.30m2   BUN/Creatinine Ratio NOT APPLICABLE 6 - 22 (calc)   Sodium 144 135 - 146 mmol/L   Potassium 4.2 3.5 - 5.3 mmol/L   Chloride 109 98 - 110 mmol/L   CO2 24 20 - 32 mmol/L   Calcium 9.0 8.6 - 10.3 mg/dL   Total Protein 6.2 6.1 - 8.1 g/dL   Albumin 4.0 3.6 - 5.1 g/dL   Globulin 2.2 1.9 - 3.7 g/dL (calc)   AG Ratio 1.8 1.0 - 2.5 (calc)   Total Bilirubin 0.3 0.2 - 1.2 mg/dL   Alkaline phosphatase (APISO) 62 35 - 144 U/L   AST 28 10 - 35 U/L   ALT 29 9 - 46 U/L  Lipid Panel  Result Value Ref Range   Cholesterol 161 <200 mg/dL   HDL 41 > OR = 40 mg/dL   Triglycerides 151 (H) <150 mg/dL   LDL Cholesterol (Calc) 95 mg/dL (calc)   Total CHOL/HDL Ratio 3.9 <5.0 (calc)    Non-HDL Cholesterol (Calc) 120 <130 mg/dL (calc)  A1C  Result Value Ref Range   Hgb A1c MFr Bld 6.5 (H) <5.7 % of total Hgb   Mean Plasma Glucose 140 (calc)   eAG (mmol/L) 7.7 (calc)  Mag  Result Value Ref Range   Magnesium 1.8 1.5 - 2.5 mg/dL        Assessment & Plan:      ICD-10-CM   1. Diabetic polyneuropathy associated with type 2 diabetes mellitus (HCC)  E11.42    Well-controlled diabetes with diagnosis of neuropathy, patient does not want gabapentin or Lyrica  2. Balance problem  R26.89 Ambulatory referral to Neurology   Secondary to neuropathy -patient request second opinion neurology referral  3. Polyarthralgia  M25.50 ANA    C-reactive protein    Rheumatoid factor    Sedimentation rate    Anti-nuclear ab-titer (ANA titer)   Some past abnormal labs elevated sed rate and elevated CK -consider rheumatology referral  4. Pain in both lower extremities  M79.604 Ambulatory referral to Neurology   M79.605    Pain and fatigue noted, ddx include his polyneuropathy, PAD, secondary to radiculopathy?  ABI's to assess arterial flow, neuro referral   5. Neuropathy involving both lower extremities  G57.93 Vitamin B12    Ambulatory referral to Neurology   generalized severe motor neuron polyneuropathy  Patient requesting second opinion neurology  6. Elevated CK  R74.8 CK (Creatine Kinase)    Anti-nuclear ab-titer (ANA titer)   Recheck CK refer to rheumatology if still elevated  7. Iron deficiency  E61.1 Iron, TIBC and Ferritin Panel   History of iron deficiency not currently on supplement recheck labs  8. Elevated sed rate  R70.0 TSH  ANA    C-reactive protein    Rheumatoid factor    Sedimentation rate   Prior elevated sed rate recheck today  9. Vitamin B12 deficiency  E53.8 Vitamin B12   History of vitamin B12 deficiency not currently supplementing complaining of paresthesias neuropathy weakness recheck B12  10. Myalgia  M79.10 TSH    ANA    C-reactive protein     Rheumatoid factor    Sedimentation rate    Anti-nuclear ab-titer (ANA titer)   Pain and weakness, elevated CK and sed rate, myopathy?  Related to polyneuropathy?  11. Benign hypertension  [id number] COMPLETE METABOLIC PANEL WITH GFR    lisinopril (ZESTRIL) 5 MG tablet   Blood pressure elevated today but he is in pain and somewhat upset we will continue to monitor, checking labs  12. Diabetes mellitus type 2 in obese (HCC)  [id number] COMPLETE METABOLIC PANEL WITH GFR   E66.9 Lipid panel    Hemoglobin A1c    metFORMIN (GLUCOPHAGE) 500 MG tablet   see #1  13. Dyslipidemia  99991111 COMPLETE METABOLIC PANEL WITH GFR    Lipid panel    Hemoglobin A1c   Elevated CK recheck if this is improved since holding the statin patient noncompliant with instructions to follow-up previously  14. Vitamin D deficiency  E55.9 VITAMIN D 25 Hydroxy (Vit-D Deficiency, Fractures)   History of vitamin D deficiency not on supplements recheck  15. Morbid obesity ([place])  E66.01    encouraged continued diet efforts      Greater than 50% of this visit was spent in direct face-to-face counseling, obtaining history and physical, discussing and educating pt on treatment plan.  Total time of this visit was 45+ min.  Remainder of time involved but was not limited to reviewing chart (recent and pertinent OV notes and labs), documentation in EMR, and coordinating care and treatment plan.    Delsa Grana, PA-C 09/04/19 2:14 PM

## 2019-09-05 ENCOUNTER — Encounter: Payer: Self-pay | Admitting: Dermatology

## 2019-09-08 ENCOUNTER — Other Ambulatory Visit: Payer: Self-pay | Admitting: Family Medicine

## 2019-09-08 DIAGNOSIS — E785 Hyperlipidemia, unspecified: Secondary | ICD-10-CM

## 2019-09-08 DIAGNOSIS — R748 Abnormal levels of other serum enzymes: Secondary | ICD-10-CM

## 2019-09-08 DIAGNOSIS — M79604 Pain in right leg: Secondary | ICD-10-CM

## 2019-09-09 LAB — COMPLETE METABOLIC PANEL WITH GFR
AG Ratio: 2.1 (calc) (ref 1.0–2.5)
ALT: 31 U/L (ref 9–46)
AST: 27 U/L (ref 10–35)
Albumin: 3.7 g/dL (ref 3.6–5.1)
Alkaline phosphatase (APISO): 50 U/L (ref 35–144)
BUN: 15 mg/dL (ref 7–25)
CO2: 27 mmol/L (ref 20–32)
Calcium: 8.8 mg/dL (ref 8.6–10.3)
Chloride: 107 mmol/L (ref 98–110)
Creat: 0.9 mg/dL (ref 0.70–1.25)
GFR, Est African American: 101 mL/min/{1.73_m2} (ref >=60)
GFR, Est Non African American: 87 mL/min/{1.73_m2} (ref >=60)
Globulin: 1.8 g/dL (calc) — ABNORMAL LOW (ref 1.9–3.7)
Glucose, Bld: 85 mg/dL (ref 65–99)
Potassium: 4.6 mmol/L (ref 3.5–5.3)
Sodium: 141 mmol/L (ref 135–146)
Total Bilirubin: 0.3 mg/dL (ref 0.2–1.2)
Total Protein: 5.5 g/dL — ABNORMAL LOW (ref 6.1–8.1)

## 2019-09-09 LAB — C-REACTIVE PROTEIN: CRP: 1.7 mg/L (ref <8.0)

## 2019-09-09 LAB — LIPID PANEL
Cholesterol: 175 mg/dL (ref <200)
HDL: 37 mg/dL — ABNORMAL LOW (ref >=40)
LDL Cholesterol (Calc): 108 mg/dL (calc) — ABNORMAL HIGH
Non-HDL Cholesterol (Calc): 138 mg/dL (calc) — ABNORMAL HIGH (ref <130)
Total CHOL/HDL Ratio: 4.7 (calc) (ref <5.0)
Triglycerides: 189 mg/dL — ABNORMAL HIGH (ref <150)

## 2019-09-09 LAB — IRON,TIBC AND FERRITIN PANEL
%SAT: 27 % (calc) (ref 20–48)
Ferritin: 18 ng/mL — ABNORMAL LOW (ref 24–380)
Iron: 102 ug/dL (ref 50–180)
TIBC: 377 mcg/dL (calc) (ref 250–425)

## 2019-09-09 LAB — HEMOGLOBIN A1C
Hgb A1c MFr Bld: 6.3 % of total Hgb — ABNORMAL HIGH (ref <5.7)
Mean Plasma Glucose: 134 (calc)
eAG (mmol/L): 7.4 (calc)

## 2019-09-09 LAB — CK: Total CK: 554 U/L — ABNORMAL HIGH (ref 44–196)

## 2019-09-09 LAB — VITAMIN B12: Vitamin B-12: 350 pg/mL (ref 200–1100)

## 2019-09-09 LAB — ANA: Anti Nuclear Antibody (ANA): POSITIVE — AB

## 2019-09-09 LAB — RHEUMATOID FACTOR: Rheumatoid fact SerPl-aCnc: <14 IU/mL (ref <14)

## 2019-09-09 LAB — ANTI-NUCLEAR AB-TITER (ANA TITER): ANA Titer 1: 1:40 {titer} — ABNORMAL HIGH

## 2019-09-09 LAB — SEDIMENTATION RATE: Sed Rate: 2 mm/h (ref 0–20)

## 2019-09-09 LAB — VITAMIN D 25 HYDROXY (VIT D DEFICIENCY, FRACTURES): Vit D, 25-Hydroxy: 14 ng/mL — ABNORMAL LOW (ref 30–100)

## 2019-09-09 LAB — TSH: TSH: 3.13 mIU/L (ref 0.40–4.50)

## 2019-09-10 ENCOUNTER — Encounter: Payer: Self-pay | Admitting: Family Medicine

## 2019-09-10 ENCOUNTER — Telehealth: Payer: Self-pay

## 2019-09-10 DIAGNOSIS — R29898 Other symptoms and signs involving the musculoskeletal system: Secondary | ICD-10-CM

## 2019-09-10 DIAGNOSIS — M791 Myalgia, unspecified site: Secondary | ICD-10-CM

## 2019-09-10 DIAGNOSIS — R768 Other specified abnormal immunological findings in serum: Secondary | ICD-10-CM

## 2019-09-10 NOTE — Telephone Encounter (Signed)
-----   Message from Delsa Grana, [place] sent at 09/09/2019  7:50 PM EDT ----- ANA positive - please put in referral to rheumatology for positive ANA and elevated CK, myalgias and LE weakness

## 2019-09-12 ENCOUNTER — Ambulatory Visit: Payer: Medicare HMO

## 2019-09-15 ENCOUNTER — Ambulatory Visit
Admission: RE | Admit: 2019-09-15 | Discharge: 2019-09-15 | Disposition: A | Discharge status: Home / Self Care | Payer: Medicare HMO | Source: Ambulatory Visit | Attending: Family Medicine | Admitting: Family Medicine

## 2019-09-15 ENCOUNTER — Other Ambulatory Visit: Payer: Self-pay

## 2019-09-15 DIAGNOSIS — M79605 Pain in left leg: Secondary | ICD-10-CM | POA: Insufficient documentation

## 2019-09-15 DIAGNOSIS — M79604 Pain in right leg: Secondary | ICD-10-CM | POA: Diagnosis not present

## 2019-09-15 DIAGNOSIS — E785 Hyperlipidemia, unspecified: Secondary | ICD-10-CM | POA: Insufficient documentation

## 2019-09-15 DIAGNOSIS — R748 Abnormal levels of other serum enzymes: Secondary | ICD-10-CM | POA: Diagnosis not present

## 2019-09-15 DIAGNOSIS — E119 Type 2 diabetes mellitus without complications: Secondary | ICD-10-CM | POA: Diagnosis not present

## 2019-09-15 DIAGNOSIS — E669 Obesity, unspecified: Secondary | ICD-10-CM | POA: Diagnosis not present

## 2019-09-18 ENCOUNTER — Other Ambulatory Visit: Payer: Self-pay

## 2019-09-18 ENCOUNTER — Encounter: Payer: Self-pay | Admitting: Family Medicine

## 2019-09-18 ENCOUNTER — Ambulatory Visit (INDEPENDENT_AMBULATORY_CARE_PROVIDER_SITE_OTHER): Payer: Medicare HMO | Admitting: Family Medicine

## 2019-09-18 VITALS — BP 124/68 | HR 92 | Temp 98.1°F | Resp 14 | Ht 68.0 in | Wt 256.8 lb

## 2019-09-18 DIAGNOSIS — R899 Unspecified abnormal finding in specimens from other organs, systems and tissues: Secondary | ICD-10-CM | POA: Diagnosis not present

## 2019-09-18 DIAGNOSIS — R748 Abnormal levels of other serum enzymes: Secondary | ICD-10-CM

## 2019-09-18 DIAGNOSIS — E785 Hyperlipidemia, unspecified: Secondary | ICD-10-CM | POA: Diagnosis not present

## 2019-09-18 DIAGNOSIS — E611 Iron deficiency: Secondary | ICD-10-CM

## 2019-09-18 DIAGNOSIS — M25551 Pain in right hip: Secondary | ICD-10-CM

## 2019-09-18 DIAGNOSIS — G5793 Unspecified mononeuropathy of bilateral lower limbs: Secondary | ICD-10-CM

## 2019-09-18 DIAGNOSIS — I714 Abdominal aortic aneurysm, without rupture, unspecified: Secondary | ICD-10-CM

## 2019-09-18 DIAGNOSIS — M2550 Pain in unspecified joint: Secondary | ICD-10-CM

## 2019-09-18 DIAGNOSIS — E559 Vitamin D deficiency, unspecified: Secondary | ICD-10-CM

## 2019-09-18 DIAGNOSIS — R700 Elevated erythrocyte sedimentation rate: Secondary | ICD-10-CM | POA: Diagnosis not present

## 2019-09-18 DIAGNOSIS — E538 Deficiency of other specified B group vitamins: Secondary | ICD-10-CM

## 2019-09-18 DIAGNOSIS — I10 Essential (primary) hypertension: Secondary | ICD-10-CM

## 2019-09-18 DIAGNOSIS — E1142 Type 2 diabetes mellitus with diabetic polyneuropathy: Secondary | ICD-10-CM | POA: Diagnosis not present

## 2019-09-18 MED ORDER — VITAMIN D (ERGOCALCIFEROL) 1.25 MG (50000 UNIT) PO CAPS: 50000.0000 [IU] | ORAL_CAPSULE | ORAL | 1 refills | Status: DC

## 2019-09-18 MED ORDER — B COMPLEX VITAMINS PO CAPS: 1.0000 | ORAL_CAPSULE | Freq: Every day | ORAL | 11 refills | Status: DC

## 2019-09-18 MED ORDER — FERROUS SULFATE 325 (65 FE) MG PO TBEC: 325.0000 mg | DELAYED_RELEASE_TABLET | ORAL | 3 refills | Status: DC

## 2019-09-18 MED ORDER — EZETIMIBE 10 MG PO TABS: 10.0000 mg | ORAL_TABLET | Freq: Every day | ORAL | 3 refills | Status: DC

## 2019-09-18 MED ORDER — PREDNISONE 20 MG PO TABS: 40.0000 mg | ORAL_TABLET | Freq: Every day | ORAL | 0 refills | Status: AC

## 2019-09-18 NOTE — Patient Instructions (Addendum)
10. Iron deficiency Ferritin low other labs and iron panel were normal with his neuropathy symptoms he may benefit from some oral iron supplementation - ferrous sulfate 325 (65 FE) MG EC tablet; Take 1 tablet (325 mg total) by mouth 3 (three) times a week.  Dispense: 36 tablet; Refill: 3  11. Vitamin B12 deficiency B12 was 350, can manifest as symptomatic though it is in the low end of normal range, will encourage patient to take a B complex over-the-counter - b complex vitamins capsule; Take 1 capsule by mouth daily.  Dispense: 30 capsule; Refill: 11  12. Vitamin D deficiency Vitamin D was low will have patient start prescription strength supplement - Vitamin D, Ergocalciferol, (DRISDOL) 1.25 MG (50000 UNIT) CAPS capsule; Take 1 capsule (50,000 Units total) by mouth every 7 (seven) days. x12 weeks.  Dispense: 12 capsule; Refill: 1       Hip Bursitis  Hip bursitis is swelling of a fluid-filled sac (bursa) in your hip joint. This swelling (inflammation) can be painful. This condition may come and go over time. What are the causes?  Injury to the hip.  Overuse of the muscles that surround the hip joint.  An earlier injury or surgery of the hip.  Arthritis or gout.  Diabetes.  Thyroid disease.  Infection.  In some cases, the cause may not be known. What are the signs or symptoms?  Mild or moderate pain in the hip area. Pain may get worse with movement.  Tenderness and swelling of the hip, especially on the outer side of the hip.  In rare cases, the bursa may become infected. This may cause: ? A fever. ? Warmth and redness in the area. Symptoms may come and go. How is this treated? This condition is treated by resting, icing, applying pressure (compression), and raising (elevating) the injured area. You may hear this called the RICE treatment. Treatment may also include:  Using crutches.  Draining fluid out of the bursa to help relieve swelling.  Giving a shot of  (injecting) medicine that helps to reduce swelling (cortisone).  Other medicines if the bursa is infected. Follow these instructions at home: Managing pain, stiffness, and swelling   If told, put ice on the painful area. ? Put ice in a plastic bag. ? Place a towel between your skin and the bag. ? Leave the ice on for 20 minutes, 2-3 times a day. ? Raise (elevate) your hip above the level of your heart as much as you can without pain. To do this, try putting a pillow under your hips while you lie down. Stop if this causes pain. Activity  Return to your normal activities as told by your doctor. Ask your doctor what activities are safe for you.  Rest and protect your hip as much as you can until you feel better. General instructions  Take over-the-counter and prescription medicines only as told by your doctor.  Wear wraps that put pressure on your hip (compression wraps) only as told by your doctor.  Do not use your hip to support your body weight until your doctor says that you can.  Use crutches as told by your doctor.  Gently rub and stretch your injured area as often as is comfortable.  Keep all follow-up visits as told by your doctor. This is important. How is this prevented?  Exercise regularly, as told by your doctor.  Warm up and stretch before being active.  Cool down and stretch after being active.  Avoid activities that  bother your hip or cause pain.  Avoid sitting down for long periods at a time. Contact a doctor if:  You have a fever.  You get new symptoms.  You have trouble walking.  You have trouble doing everyday activities.  You have pain that gets worse.  You have pain that does not get better with medicine.  You get red skin on your hip area.  You get a feeling of warmth in your hip area. Get help right away if:  You cannot move your hip.  You have very bad pain. Summary  Hip bursitis is swelling of a fluid-filled sac (bursa) in your  hip.  Hip bursitis can be painful.  Symptoms often come and go over time.  This condition is treated with rest, ice, compression, elevation, and medicines. This information is not intended to replace advice given to you by your health care provider. Make sure you discuss any questions you have with your health care provider. Document Revised: 12/24/2017 Document Reviewed: 12/24/2017 [employer] Patient Education  [facility] Ask your health care provider which exercises are safe for you. Do exercises exactly as told by your health care provider and adjust them as directed. It is normal to feel mild stretching, pulling, tightness, or discomfort as you do these exercises. Stop right away if you feel sudden pain or your pain gets significantly worse. Do not begin these exercises until told by your health care provider. Stretching and range-of-motion exercises These exercises warm up your muscles and joints and improve the movement and flexibility of your hip and pelvis. Quadriceps stretch, prone  1. Lie on your abdomen on a firm surface, such as a bed or padded floor (prone position). 2. Bend your left / right knee and reach back to hold your ankle or pant leg. If you cannot reach your ankle or pant leg, loop a belt around your foot and grab the belt instead. 3. Gently pull your heel toward your buttocks. Your knee should not slide out to the side. You should feel a stretch in the front of your thigh and knee (quadriceps). 4. Hold this position for __________ seconds. Repeat __________ times. Complete this exercise __________ times a day. Iliotibial band stretch An iliotibial band is a strong band of muscle tissue that runs from the outer side of your hip to the outer side of your thigh and knee. 1. Lie on your side with your left / right leg in the top position. 2. Bend both of your knees and grab your left / right ankle. Stretch out your bottom arm to  help you balance. 3. Slowly bring your top knee back so your thigh goes behind your trunk. 4. Slowly lower your top leg toward the floor until you feel a gentle stretch on the outside of your left / right hip and thigh. If you do not feel a stretch and your knee will not fall farther, place the heel of your other foot on top of your knee and pull your knee down toward the floor with your foot. 5. Hold this position for __________ seconds. Repeat __________ times. Complete this exercise __________ times a day. Strengthening exercises These exercises build strength and endurance in your hip and pelvis. Endurance is the ability to use your muscles for a long time, even after they get tired. Straight leg raises, side-lying This exercise strengthens the muscles that rotate the leg at the hip and move it away from your body (  hip abductors). 1. Lie on your side with your left / right leg in the top position. Lie so your head, shoulder, hip, and knee line up. You may bend your bottom knee to help you balance. 2. Roll your hips slightly forward so your hips are stacked directly over each other and your left / right knee is facing forward. 3. Tense the muscles in your outer thigh and lift your top leg 4-6 inches (10-15 cm). 4. Hold this position for __________ seconds. 5. Slowly return to the starting position. Let your muscles relax completely before doing another repetition. Repeat __________ times. Complete this exercise __________ times a day. Leg raises, prone This exercise strengthens the muscles that move the hips (hip extensors). 1. Lie on your abdomen on your bed or a firm surface. You can put a pillow under your hips if that is more comfortable for your lower back. 2. Bend your left / right knee so your foot is straight up in the air. 3. Squeeze your buttocks muscles and lift your left / right thigh off the bed. Do not let your back arch. 4. Tense your thigh muscle as hard as you can without  increasing any knee pain. 5. Hold this position for __________ seconds. 6. Slowly lower your leg to the starting position and allow it to relax completely. Repeat __________ times. Complete this exercise __________ times a day. Hip hike 1. Stand sideways on a bottom step. Stand on your left / right leg with your other foot unsupported next to the step. You can hold on to the railing or wall for balance if needed. 2. Keep your knees straight and your torso square. Then lift your left / right hip up toward the ceiling. 3. Slowly let your left / right hip lower toward the floor, past the starting position. Your foot should get closer to the floor. Do not lean or bend your knees. Repeat __________ times. Complete this exercise __________ times a day. This information is not intended to replace advice given to you by your health care provider. Make sure you discuss any questions you have with your health care provider. Document Revised: 08/08/2018 Document Reviewed: 02/06/2018 [employer] Patient Education  [place].

## 2019-09-18 NOTE — Progress Notes (Signed)
Name: Duane Hughes   MRN: [id number]    DOB: 09-21-1950   Date:09/18/2019       Progress Note  Chief Complaint  Patient presents with  . Follow-up    labs and routine conditions     Subjective:   Duane Hughes is a 69 y.o. male, presents to clinic for routine follow up on the conditions listed above.  Pt here for f/up after last OV and here today to review labs:   1.  Diabetic polyneuropathy associated with type 2 diabetes mellitus  Well-controlled diabetes with diagnosis of neuropathy, patient does not want gabapentin or Lyrica A1c was well controlled at 6.3 consistent with a history of being well controlled   2.  Balance problem Secondary to neuropathy -patient request second opinion neurology referral Patient has a office visit scheduled with [facility]  3.  Polyarthralgia and myalgias, reviewed patient's labs with him  ANA -positive although we discussed the occurrence of positive ANA test in people who do not have any autoimmune or rheumatological disease.  He is also on Lamictal from psychiatry this may also cause a false positive   C-reactive protein-negative   Rheumatoid factor -negative   Sedimentation rate -negative   Anti-nuclear ab-titer (ANA titer)  4.  Pain in both lower extremities  Pain and fatigue noted, ddx include his polyneuropathy, PAD, secondary to radiculopathy?  ABI's to assess arterial flow, neuro referral  PAD ruled out with unremarkable ABIs Going to see neurology and rheumatology  May also be secondary to low back?  In imaging from 2018 for his hips there was noted visualization of degenerative changes in his lumbar spine -did not see that his had MRI done  5.  Neuropathy involving both lower extremities   F/up with Neurology with hx of generalized severe motor neuron polyneuropathy   6.  Elevated CK -patient today was very alarmed that he had a "sky high" CK and he wondered what this was.  I discussed with him that we  talked about this last week and that in November he had very elevated CK and was encouraged to follow-up and recheck in 3 weeks but he did not come back for 6 months.  I doubt that the statin has caused this or exacerbated this his level 6 months ago and his level 2 weeks ago minimally improved and both elevated.  He has not had any dark or change in urine color or output.  He has not had any change to his joint or muscle pain numbness and weakness. CK 03/31/2019  - 617 CK 09/04/2019      -554 Patient does have an appointment scheduled with Dr. Meda Coffee at [facility]    7.Iron deficiency History of iron deficiency not currently on supplement recheck labs Labs show low ferritin -he still not taking a iron supplement  8.Elevated sed rate - Prior elevated sed rate recheck today Rechecked - improved and was normal  9.Vitamin B12 deficiency  History of vitamin B12 deficiency not currently supplementing complaining of paresthesias neuropathy weakness recheck B12 Pt labs 350, 200-400 low end of normal, "Although the reference range for vitamin  B12 is 5630706802 pg/mL, it has been reported that between  5 and 10% of patients with values between 200 and 400  pg/mL may experience neuropsychiatric and hematologic  abnormalities due to occult B12 deficiency"   10.  Myalgia - Pain and weakness, elevated CK and sed rate, myopathy?  Related to polyneuropathy? Pt following up with both  neuro and rheumatology  11.  Benign hypertension - see below 12.  Diabetes mellitus type 2 in obese (HCC)    see #1 13.   Dyslipidemia -Elevated CK recheck if this is improved since holding the statin patient noncompliant with instructions to follow-up previously  14.  Vitamin D deficiency -  Last vitamin D Lab Results  Component Value Date   VD25OH 14 (L) 09/04/2019   15.  Morbid obesity ([place]) Wt Readings from Last 5 Encounters:  09/18/19 256 lb 12.8 oz (116.5 kg)  09/04/19 257 lb (116.6 kg)  05/20/19 254 lb  11.2 oz (115.5 kg)  03/31/19 260 lb 9.6 oz (118.2 kg)  03/06/19 255 lb (115.7 kg)   Hypertension:  Currently managed on lisinopril Pt reports good med compliance and denies any SE.  No lightheadedness, hypotension, syncope. Blood pressure today is well controlled. BP Readings from Last 3 Encounters:  09/18/19 124/68  09/04/19 (!) 152/82  05/20/19 112/62   Pt denies CP, SOB, exertional sx, LE edema, palpitation, Ha's, visual disturbances  Diabetes Mellitus Type II: Hx of being well controlled, stable, no change, reviewed A1C again today with pt, encouraged to continue his metformin 1000 mg in the morning and 500 mg in the evening Pt notes good med compliance Pt has no SE from meds. Recent pertinent labs: Lab Results  Component Value Date   HGBA1C 6.3 (H) 09/04/2019   HGBA1C 6.5 (H) 03/31/2019   HGBA1C 6.2 (H) 05/30/2018  Pt is UTD DM foot exam and eye exam ACEI/ARB: Yes Statin: No - held - see below  Hyperlipidemia: Current Medication Regimen:  Holding statin x 6 months due to elevated CK - pt did not follow up, CK did not improve and sx did not change at all Last Lipids: Lab Results  Component Value Date   CHOL 175 09/04/2019   HDL 37 (L) 09/04/2019   LDLCALC 108 (H) 09/04/2019   TRIG 189 (H) 09/04/2019   CHOLHDL 4.7 09/04/2019  Reviewed his lipid results today LDL is only slightly higher than 6 months ago - Denies: Chest pain, shortness of breath - Risk factors for atherosclerosis: diabetes mellitus, hypercholesterolemia and hypertension Reviewed changes in his LDL over the past year, patient today states that he has been on his fatty acid supplement we discussed starting Zetia Lab Results  Component Value Date   LDLCALC 108 (H) 09/04/2019   [place] 95 03/31/2019   [place] 71 05/30/2018   Patient asks about why he is going to vascular specialist and asked what a AAA is -reviewed with him his past medical history and findings and explained that he will do an annual  follow-up with vascular specialist.  Also reviewed ABIs today.  Patient additionally complained about new onset of right lateral hip and thigh pain that is sharp stabbing feeling very severe is exacerbated if he rolls over on that side or presses on it.  He is being careful with how he walks that he does not make it worse  Patient Active Problem List   Diagnosis Date Noted  . Diabetic polyneuropathy associated with type 2 diabetes mellitus ([place]) 03/05/2019  . Paronychia of great toe of right foot 01/30/2019  . Status post total left knee replacement 09/24/2018  . Abdominal aortic aneurysm ([place]) 09/24/2018  . Peripheral neuropathy 10/29/2017  . Hx of total knee arthroplasty, right 04/04/2017  . LAE (left atrial enlargement) 03/08/2017  . Right atrial enlargement 03/08/2017  . Vitamin D deficiency 11/21/2016  . Hx of hypogonadism 11/17/2016  .  Lymphadenopathy, axillary 06/06/2016  . Benign neoplasm of ascending colon   . Bilateral low back pain with bilateral sciatica 03/26/2016  . 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 Dyslipidemia 06/22/2015  . Diabetes mellitus type 2 in obese ([place]) 03/19/2015  . Obstructive sleep apnea 03/18/2015  . Controlled substance agreement signed 03/18/2015  . Drug-induced erectile dysfunction 11/13/2014  . Morbid obesity ([place]) 11/11/2014  . Major depressive disorder, recurrent ([place]) 11/11/2014  . Benign hypertension 11/11/2014  . Enlarged prostate 11/11/2014    Past Surgical History:  Procedure Laterality Date  . COLONOSCOPY WITH PROPOFOL N/A 05/09/2016   Procedure: COLONOSCOPY WITH PROPOFOL;  Surgeon: Lucilla Lame, MD;  Location: [facility] ENDOSCOPY;  Service: Endoscopy;  Laterality: N/A;  . HERNIA REPAIR     [place]  . JOINT REPLACEMENT     04-04-17 Dr. Gladstone Lighter   Right knee  . KNEE ARTHROSCOPY     several scopes done  . KNEE CLOSED REDUCTION Right 05/31/2017   Procedure: CLOSED MANIPULATION RIGHT KNEE;  Surgeon: Latanya Maudlin, MD;   Location: [facility];  Service: Orthopedics;  Laterality: Right;  Femoral Block  . MEDIAL PARTIAL KNEE REPLACEMENT Left    2000s?  Marland Kitchen SKIN CANCER EXCISION  Feb 2017   removed from right hand  . 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 TOTAL KNEE ARTHROPLASTY Left 09/24/2018   Procedure: REVISION LEFT KNEE ARTHROPLASTY;  Surgeon: Paralee Cancel, MD;  Location: [facility];  Service: Orthopedics;  Laterality: Left;  70 mins    Family History  Problem Relation Age of Onset  . Alcohol abuse Mother   . Rheum arthritis Mother   . Stroke Maternal Grandmother   . Stroke Paternal Grandfather   . Dementia Paternal Grandmother   . Cancer Neg Hx   . Diabetes Neg Hx   . Heart disease Neg Hx   . Hypertension Neg Hx   . Kidney disease Neg Hx   . Prostate cancer Neg Hx     Social History   Tobacco Use  . Smoking status: Never Smoker  . Smokeless tobacco: Never Used  Substance Use Topics  . Alcohol use: No    Alcohol/week: 0.0 standard drinks  . Drug use: No      Current Outpatient Medications:  .  aspirin EC 81 MG tablet, Take 81 mg by mouth daily., Disp: , Rfl:  .  buPROPion (WELLBUTRIN SR) 150 MG 12 hr tablet, TAKE 1 TABLET BY MOUTH TWICE A DAY, Disp: 180 tablet, Rfl: 0 .  hydrocortisone 2.5 % cream, Apply 1 application topically daily as needed (rash)., Disp: , Rfl:  .  ketoconazole (NIZORAL) 2 % cream, Apply 1 application topically daily as needed (rash)., Disp: , Rfl:  .  lamoTRIgine (LAMICTAL) 200 MG tablet, TAKE 1 TABLET (200 MG TOTAL) BY MOUTH DAILY., Disp: 90 tablet, Rfl: 1 .  lisinopril (ZESTRIL) 5 MG tablet, Take 0.5 tablets (2.5 mg total) by mouth daily., Disp: 45 tablet, Rfl: 3 .  metFORMIN (GLUCOPHAGE) 500 MG tablet, Take 2 tablets with breakfast and 1 tablet with dinner daily., Disp: 270 tablet, Rfl: 3 .  Omega-3 Fatty Acids (FISH OIL) 1200 MG  CAPS, Take 1,200 mg by mouth daily., Disp: , Rfl:  .  pramipexole (MIRAPEX) 0.5 MG tablet, TAKE 1 TABLET (0.5 MG TOTAL) BY MOUTH 2 (TWO) TIMES A DAY., Disp: 180 tablet, Rfl: 1 .  sildenafil (REVATIO) 20 MG tablet, Take 3 to 5 tablets two hours before intercouse on an empty stomach.  Do not take with nitrates., Disp: 50 tablet, Rfl: 3  No Known Allergies  Chart Review Today: I personally reviewed active problem list, medication list, allergies, family history, social history, health maintenance, notes from last encounter, lab results, imaging with the patient/caregiver today.   Review of Systems  10 Systems reviewed and are negative for acute change except as noted in the HPI.    Objective:    Vitals:   09/18/19 1442  BP: 124/68  Pulse: 92  Resp: 14  Temp: 98.1 F (36.7 C)  SpO2: 96%  Weight: 256 lb 12.8 oz (116.5 kg)  Height: 5\' 8"  (1.727 m)    Body mass index is 39.05 kg/m.  Physical Exam Vitals and nursing note reviewed.  Constitutional:      General: He is not in acute distress.    Appearance: He is well-developed. He is obese. He is not ill-appearing, toxic-appearing or diaphoretic.  HENT:     Head: Normocephalic and atraumatic.     Ears:     Comments: HOH Eyes:     General:        Right eye: No discharge.        Left eye: No discharge.     Conjunctiva/sclera: Conjunctivae normal.  Neck:     Trachea: No tracheal deviation.  Cardiovascular:     Rate and Rhythm: Normal rate and regular rhythm.  Pulmonary:     Effort: Pulmonary effort is normal. No respiratory distress.     Breath sounds: No stridor.  Musculoskeletal:        General: No swelling or tenderness.     Right hip: No tenderness, bony tenderness or crepitus.     Comments: Right hip normal external and internal rotation  Skin:    General: Skin is warm and dry.     Findings: No rash.  Neurological:     Mental Status: He is alert.     Motor: No abnormal muscle tone.     Coordination:  Coordination normal.       PHQ2/9: Depression screen [facility] 2/9 09/18/2019 09/04/2019 05/20/2019 05/20/2019 03/31/2019  Decreased Interest 0 0 0 0 0  Down, Depressed, Hopeless 0 0 1 0 0  PHQ - 2 Score 0 0 1 0 0  Altered sleeping 0 0 1 - 0  Tired, decreased energy 0 0 1 - 0  Change in appetite 0 0 0 - 0  Feeling bad or failure about yourself  0 0 0 - 0  Trouble concentrating 0 0 0 - 0  Moving slowly or fidgety/restless 0 0 0 - 0  Suicidal thoughts 0 0 - - 0  PHQ-9 Score 0 0 3 - 0  Difficult doing work/chores - - Not difficult at all - Not difficult at all  Some recent data might be hidden    phq 9 is neg  Fall Risk: Fall Risk  09/18/2019 09/04/2019 05/20/2019 03/31/2019 11/29/2018  Falls in the past year? 0 0 0 0 0  Comment - - - - -  Number falls in past yr: 0 0 0 0 0  Injury with Fall? 0 0 0 0 0  Risk for fall due to : - - Impaired balance/gait - -  Follow up - - Falls prevention discussed - -    Functional Status Survey: Is the patient deaf or have difficulty hearing?: Yes Does the patient have difficulty seeing,  even when wearing glasses/contacts?: No Does the patient have difficulty concentrating, remembering, or making decisions?: No Does the patient have difficulty walking or climbing stairs?: No Does the patient have difficulty dressing or bathing?: No Does the patient have difficulty doing errands alone such as visiting a doctor's office or shopping?: No   Assessment & Plan:   1. Abnormal laboratory test result Extensive review and recap of everything we addressed at last office visit, all of his labs reviewed thoroughly with him today.  See HPI for details of all diagnoses reviewed and all labs reviewed today at the office visit  2. Right hip pain Suspect he may have some right greater trochanter bursitis or IT band syndrome, encouraged him to apply ice, try the prednisone burst if he feels that it is worsening.  Feel it is appropriate to use steroids if it may be bursitis  or inflamed nerve.  Handouts given on hip bursitis and IT band rehab and exercises he can do try at home once his inflammation and severe pain calms down.  Prednisone was sent into his pharmacy he was encouraged to try it if he has continued pain.  - predniSONE (DELTASONE) 20 MG tablet; Take 2 tablets (40 mg total) by mouth daily with breakfast for 5 days. For hip pain/bursitis  Dispense: 10 tablet; Refill: 0  3. Dyslipidemia LDL had slightly increased but it was not terribly worse than his last labs.  He will continue his fatty acid supplement, we discussed trying Zetia while waiting to see the specialist regarding elevated CK.  Because CK remained abnormally high and did not improve very much with holding the statin for several months, do not suspect that the statin because the elevated CK but explained to the patient and I am not sure if it will exacerbate elevated CK. Will hold for now, consider Zetia - ezetimibe (ZETIA) 10 MG tablet; Take 1 tablet (10 mg total) by mouth daily.  Dispense: 90 tablet; Refill: 3  4. Diabetic polyneuropathy associated with type 2 diabetes mellitus ([place]) Diabetes well controlled, stable, continue Metformin 1000 mg in the a.m. and 500 mg in the p.m.  5. Benign hypertension Well-controlled blood pressure today on lisinopril 5 mg, his renal function electrolytes were reviewed today and normal  6. Elevated CK Elevated CK did not improve remained about 2-3 times normal -he has follow-up with rheumatology  7. Polyarthralgia Follow-up with rheumatology  8. Neuropathy involving both lower extremities Unchanged patient is consulting with a new neurologist for second opinion Neuropathy may also be related to low back which I do not believe has been worked up yet  9. Elevated sed rate Normal with repeat labs  10. Iron deficiency Ferritin low other labs and iron panel were normal with his neuropathy symptoms he may benefit from some oral iron supplementation -  ferrous sulfate 325 (65 FE) MG EC tablet; Take 1 tablet (325 mg total) by mouth 3 (three) times a week.  Dispense: 36 tablet; Refill: 3  11. Vitamin B12 deficiency B12 was 350, can manifest as symptomatic though it is in the low end of normal range, will encourage patient to take a B complex over-the-counter - b complex vitamins capsule; Take 1 capsule by mouth daily.  Dispense: 30 capsule; Refill: 11  12. Vitamin D deficiency Vitamin D was low will have patient start prescription strength supplement - Vitamin D, Ergocalciferol, (DRISDOL) 1.25 MG (50000 UNIT) CAPS capsule; Take 1 capsule (50,000 Units total) by mouth every 7 (seven) days. x12 weeks.  Dispense: 12 capsule; Refill: 1  13. Abdominal aortic aneurysm (AAA) without rupture ([place]) Explained patient's past medical history to him encouraged him to follow-up with the vascular specialist, he does have an appointment in July    Return for 3 month routine f/up review .   Delsa Grana, PA-C 09/18/19 3:05 PM

## 2019-10-07 ENCOUNTER — Encounter: Payer: Self-pay | Admitting: Family Medicine

## 2019-10-07 NOTE — Telephone Encounter (Signed)
Patient sent a MyChart message stating that we refused his pain medications.  I have not received any messages nor refused any medications.  I also did not know that he was on any narcotic pain medications.  CMA reached out per my request to inquire about what medications he was talking about.  He stated that he rarely takes them -reported he was wanting oxycodone 5 mg filled  This was not on his chart, controlled substance database was consulted,/PDMP reviewed today  Medications are over 39 year old and prescribed by a specialist -further review of chart reveals that this is from orthopedic group relative to surgery:  10/10/2018  1   10/10/2018  Oxycodone Hcl 5 MG Tablet  60.00  10 Ma Bab   72110   Gen ([id number])   0  45.00 MME  Comm Ins   [place]  10/04/2018  1   10/04/2018  Oxycodone Hcl 5 MG Tablet  60.00  10 Ma Bab   [id number]   Gen [phone]   0  45.00 MME  Medicare   [place]  09/24/2018  1   09/24/2018  Oxycodone Hcl 5 MG Tablet  60.00  10 Ma Bab   [id number]   Gen (0953)   0  45.00 MME  Medicare   [place]   Acute issues recently assessed or discussed pain management at PCP office nor was there any prior chronic pain management done by patient's past PCP  Patient had declined multiple medications at her last 2 office visits for his neuropathy declining gabapentin and Lyrica  He will need office visit to assess what pain complaints or source of pain he has, and pt will be notified of limitations and conservative approach at our clinic with any controlled substance prescribing, also will be notified of new laws requirements for any controlled substance prescribing which requires by law office visits, pain management contract, urine drug screens, consultation with supervising physician and regular follow-up.  Delsa Grana, PA-C

## 2019-10-08 DIAGNOSIS — G8929 Other chronic pain: Secondary | ICD-10-CM | POA: Diagnosis not present

## 2019-10-08 DIAGNOSIS — R29898 Other symptoms and signs involving the musculoskeletal system: Secondary | ICD-10-CM | POA: Insufficient documentation

## 2019-10-08 DIAGNOSIS — M5442 Lumbago with sciatica, left side: Secondary | ICD-10-CM | POA: Diagnosis not present

## 2019-10-08 DIAGNOSIS — R768 Other specified abnormal immunological findings in serum: Secondary | ICD-10-CM | POA: Diagnosis not present

## 2019-10-08 DIAGNOSIS — M4807 Spinal stenosis, lumbosacral region: Secondary | ICD-10-CM | POA: Diagnosis not present

## 2019-10-08 DIAGNOSIS — E1142 Type 2 diabetes mellitus with diabetic polyneuropathy: Secondary | ICD-10-CM | POA: Diagnosis not present

## 2019-10-08 DIAGNOSIS — M5441 Lumbago with sciatica, right side: Secondary | ICD-10-CM | POA: Diagnosis not present

## 2019-10-09 ENCOUNTER — Other Ambulatory Visit: Payer: Self-pay | Admitting: Internal Medicine

## 2019-10-09 DIAGNOSIS — R29898 Other symptoms and signs involving the musculoskeletal system: Secondary | ICD-10-CM

## 2019-10-16 ENCOUNTER — Ambulatory Visit (INDEPENDENT_AMBULATORY_CARE_PROVIDER_SITE_OTHER): Payer: Medicare HMO | Admitting: Vascular Surgery

## 2019-10-16 ENCOUNTER — Ambulatory Visit (INDEPENDENT_AMBULATORY_CARE_PROVIDER_SITE_OTHER): Payer: Medicare HMO

## 2019-10-16 ENCOUNTER — Other Ambulatory Visit: Payer: Self-pay

## 2019-10-16 ENCOUNTER — Encounter (INDEPENDENT_AMBULATORY_CARE_PROVIDER_SITE_OTHER): Payer: Self-pay | Admitting: Vascular Surgery

## 2019-10-16 VITALS — BP 164/94 | HR 84 | Resp 16 | Ht 69.0 in | Wt 254.0 lb

## 2019-10-16 DIAGNOSIS — E1169 Type 2 diabetes mellitus with other specified complication: Secondary | ICD-10-CM

## 2019-10-16 DIAGNOSIS — I714 Abdominal aortic aneurysm, without rupture, unspecified: Secondary | ICD-10-CM

## 2019-10-16 DIAGNOSIS — E785 Hyperlipidemia, unspecified: Secondary | ICD-10-CM

## 2019-10-16 DIAGNOSIS — I890 Lymphedema, not elsewhere classified: Secondary | ICD-10-CM | POA: Diagnosis not present

## 2019-10-16 DIAGNOSIS — E669 Obesity, unspecified: Secondary | ICD-10-CM | POA: Diagnosis not present

## 2019-10-16 DIAGNOSIS — I10 Essential (primary) hypertension: Secondary | ICD-10-CM

## 2019-10-16 NOTE — Progress Notes (Signed)
MRN : [id number]  Duane Hughes is a 69 y.o. (1951-01-19) male who presents with chief complaint of  Chief Complaint  Patient presents with  . Follow-up    ultrasound  .  History of Present Illness:   The patient presents to the office for evaluation of an abdominal aortic aneurysm. The aneurysm was found incidentally and is followed by ultrasound. Patient denies abdominal pain or unusual back pain, no other abdominal complaints.  No history of an acute onset of painful blue discoloration of the toes.     No family history of AAA.   The patient is also followed for leg swelling.  The swelling has persisted and the pain associated with swelling continues. There have not been any interval development of a ulcerations or wounds.  Since the previous visit the patient has been wearing graduated compression stockings and has noted little if any improvement in the lymphedema. The patient has not been using compression routinely morning until night.  The patient also states elevation during the day and exercise is being done too.   Patient denies amaurosis fugax or TIA symptoms. There is no history of claudication or rest pain symptoms of the lower extremities.  The patient denies angina or shortness of breath.  Duplex ultrasound shows an AAA that measures 3.60 cm  Current Meds  Medication Sig  . aspirin EC 81 MG tablet Take 81 mg by mouth daily.  Marland Kitchen b complex vitamins capsule Take 1 capsule by mouth daily.  Marland Kitchen buPROPion (WELLBUTRIN SR) 150 MG 12 hr tablet TAKE 1 TABLET BY MOUTH TWICE A DAY  . ferrous sulfate 325 (65 FE) MG EC tablet Take 1 tablet (325 mg total) by mouth 3 (three) times a week.  . hydrocortisone 2.5 % cream Apply 1 application topically daily as needed (rash).  Marland Kitchen ketoconazole (NIZORAL) 2 % cream Apply 1 application topically daily as needed (rash).  Marland Kitchen lamoTRIgine (LAMICTAL) 200 MG tablet TAKE 1 TABLET (200 MG TOTAL) BY MOUTH DAILY.  Marland Kitchen lisinopril (ZESTRIL) 5 MG  tablet Take 0.5 tablets (2.5 mg total) by mouth daily.  . metFORMIN (GLUCOPHAGE) 500 MG tablet Take 2 tablets with breakfast and 1 tablet with dinner daily.  Marland Kitchen omega-3 acid ethyl esters (LOVAZA) 1 g capsule Take by mouth 2 (two) times daily.  . Omega-3 Fatty Acids (FISH OIL) 1200 MG CAPS Take 1,200 mg by mouth daily.  . pramipexole (MIRAPEX) 0.5 MG tablet TAKE 1 TABLET (0.5 MG TOTAL) BY MOUTH 2 (TWO) TIMES A DAY.  . sildenafil (REVATIO) 20 MG tablet Take 3 to 5 tablets two hours before intercouse on an empty stomach.  Do not take with nitrates.  . Vitamin D, Ergocalciferol, (DRISDOL) 1.25 MG (50000 UNIT) CAPS capsule Take 1 capsule (50,000 Units total) by mouth every 7 (seven) days. x12 weeks.    Past Medical History:  Diagnosis Date  . Anxiety    Dr.Jessica Eulas Post . well controlled  . Arthritis    back, hands, knees  . Colon polyp 05/09/2016   TUBULAR ADENOMAS   . Depression   . Diabetes mellitus without complication ([place])    type 2  . Ectatic abdominal aorta ([place]) 04/18/2016   2.9 cm on [place] 2016, unchanged 2017  . History of hiatal hernia    20 years ago  . History of skin cancer    hand  . Hyperlipidemia   . Hypertension 2015   Dr. Sanda Klein  . Morbid obesity ([place]) 11/11/2014  . Neuropathy   .  Obesity 11/11/2014  . Sleep apnea    cpap  . Squamous cell carcinoma of skin 04/11/2018   SCC In Situ R dorsum wrist  . Squamous cell carcinoma of skin 06/23/2015   R dorsum hand    Past Surgical History:  Procedure Laterality Date  . COLONOSCOPY WITH PROPOFOL N/A 05/09/2016   Procedure: COLONOSCOPY WITH PROPOFOL;  Surgeon: Lucilla Lame, MD;  Location: [facility] ENDOSCOPY;  Service: Endoscopy;  Laterality: N/A;  . HERNIA REPAIR     Hiatal  . JOINT REPLACEMENT     04-04-17 Dr. Gladstone Lighter   Right knee  . KNEE ARTHROSCOPY     several scopes done  . KNEE CLOSED REDUCTION Right 05/31/2017   Procedure: CLOSED MANIPULATION RIGHT KNEE;  Surgeon: Latanya Maudlin, MD;  Location: [facility];  Service:  Orthopedics;  Laterality: Right;  Femoral Block  . MEDIAL PARTIAL KNEE REPLACEMENT Left    2000s?  Marland Kitchen SKIN CANCER EXCISION  Feb 2017   removed from right hand  . 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 TOTAL KNEE ARTHROPLASTY Left 09/24/2018   Procedure: REVISION LEFT KNEE ARTHROPLASTY;  Surgeon: Paralee Cancel, MD;  Location: [facility];  Service: Orthopedics;  Laterality: Left;  70 mins    Social History Social History   Tobacco Use  . Smoking status: Never Smoker  . Smokeless tobacco: Never Used  Vaping Use  . Vaping Use: Never used  Substance Use Topics  . Alcohol use: No    Alcohol/week: 0.0 standard drinks  . Drug use: No    Family History Family History  Problem Relation Age of Onset  . Alcohol abuse Mother   . Rheum arthritis Mother   . Stroke Maternal Grandmother   . Stroke Paternal Grandfather   . Dementia Paternal Grandmother   . Cancer Neg Hx   . Diabetes Neg Hx   . Heart disease Neg Hx   . Hypertension Neg Hx   . Kidney disease Neg Hx   . Prostate cancer Neg Hx     No Known Allergies   REVIEW OF SYSTEMS (Negative unless checked)  Constitutional: [] Weight loss  [] Fever  [] Chills Cardiac: [] Chest pain   [] Chest pressure   [] Palpitations   [] Shortness of breath when laying flat   [] Shortness of breath with exertion. Vascular:  [] Pain in legs with walking   [] Pain in legs at rest  [] History of DVT   [] Phlebitis   [x] Swelling in legs   [] Varicose veins   [] Non-healing ulcers Pulmonary:   [] Uses home oxygen   [] Productive cough   [] Hemoptysis   [] Wheeze  [] COPD   [] Asthma Neurologic:  [] Dizziness   [] Seizures   [] History of stroke   [] History of TIA  [] Aphasia   [] Vissual changes   [] Weakness or numbness in arm   [] Weakness or numbness in leg Musculoskeletal:   [] Joint swelling   [] Joint pain   [] Low back pain Hematologic:   [] Easy bruising  [] Easy bleeding   [] Hypercoagulable state   [] Anemic Gastrointestinal:  [] Diarrhea   [] Vomiting  [] Gastroesophageal reflux/heartburn   [] Difficulty swallowing. Genitourinary:  [] Chronic kidney disease   [] Difficult urination  [] Frequent urination   [] Blood in urine Skin:  [] Rashes   [] Ulcers  Psychological:  [] History of anxiety   []  History of major depression.  Physical Examination  Vitals:   10/16/19 1023  BP: (!) 164/94  Pulse: 84  Resp:  16  Weight: 254 lb (115.2 kg)  Height: 5\' 9"  (1.753 m)   Body mass index is 37.51 kg/m. Gen: WD/WN, NAD Head: [place]/AT, No temporalis wasting.  Ear/Nose/Throat: Hearing grossly intact, nares w/o erythema or drainage Eyes: PER, EOMI, sclera nonicteric.  Neck: Supple, no large masses.   Pulmonary:  Good air movement, no audible wheezing bilaterally, no use of accessory muscles.  Cardiac: RRR, no JVD Vascular: scattered varicosities present bilaterally.  Mild venous stasis changes to the legs bilaterally.  2+ soft pitting edema Vessel Right Left  Radial Palpable Palpable  Gastrointestinal: Non-distended. No guarding/no peritoneal signs.  Musculoskeletal: M/S 5/5 throughout.  No deformity or atrophy.  Neurologic: CN 2-12 intact. Symmetrical.  Speech is fluent. Motor exam as listed above. Psychiatric: Judgment intact, Mood & affect appropriate for pt's clinical situation. Dermatologic: No rashes or ulcers noted.  No changes consistent with cellulitis. Lymph : No lichenification or skin changes of chronic lymphedema.  CBC Lab Results  Component Value Date   WBC 4.7 03/31/2019   HGB 13.8 03/31/2019   HCT 41.5 03/31/2019   MCV 91.2 03/31/2019   PLT 252 03/31/2019    BMET    Component Value Date/Time   NA 141 09/04/2019 1448   NA 139 05/12/2015 1537   K 4.6 09/04/2019 1448   CL 107 09/04/2019 1448   CO2 27 09/04/2019 1448   GLUCOSE 85 09/04/2019 1448   BUN 15 09/04/2019 1448   BUN 15 05/12/2015 1537   CREATININE 0.90  09/04/2019 1448   CALCIUM 8.8 09/04/2019 1448   GFRNONAA 87 09/04/2019 1448   GFRAA 101 09/04/2019 1448   CrCl cannot be calculated (Patient's most recent lab result is older than the maximum 21 days allowed.).  COAG Lab Results  Component Value Date   INR 0.98 03/29/2017    Radiology No results found.   Assessment/Plan 1. Abdominal aortic aneurysm (AAA) without rupture ([place]) No surgery or intervention at this time. The patient has an asymptomatic abdominal aortic aneurysm that is less than 4 cm in maximal diameter.  I have discussed the natural history of abdominal aortic aneurysm and the small risk of rupture for aneurysm less than 5 cm in size.  However, as these small aneurysms tend to enlarge over time, continued surveillance with ultrasound or CT scan is mandatory.  I have also discussed optimizing medical management with hypertension and lipid control and the importance of abstinence from tobacco.  The patient is also encouraged to exercise a minimum of 30 minutes 4 times a week.  Should the patient develop new onset abdominal or back pain or signs of peripheral embolization they are instructed to seek medical attention immediately and to alert the physician providing care that they have an aneurysm.  The patient voices their understanding. The patient will return in 12 months with an aortic duplex.  - VAS US AORTA/IVC/ILIACS; Future  2. Lymphedema No surgery or intervention at this point in time.    I have reviewed my discussion with the patient regarding venous insufficiency and secondary lymph edema and why it  causes symptoms. I have discussed with the patient the chronic skin changes that accompany these problems and the long term sequela such as ulceration and infection.  Patient will continue wearing graduated compression stockings class 1 (20-30 mmHg) on a daily basis a prescription was given to the patient to keep this updated. The patient will  put the stockings on  first thing in the morning and removing them in the evening.  The patient is instructed specifically not to sleep in the stockings.  In addition, behavioral modification including elevation during the day will be continued.  Diet and salt restriction was also discussed.  Following the review of the ultrasound the patient will follow up in 12 months to reassess the degree of swelling and the control that graduated compression is offering.   The patient can be assessed for a Lymph Pump at that time.  However, at this time the patient states they are satisfied with the control compression and elevation is yielding.    3. Benign hypertension Continue antihypertensive medications as already ordered, these medications have been reviewed and there are no changes at this time.   4. Diabetes mellitus type 2 in obese ([place]) Continue hypoglycemic medications as already ordered, these medications have been reviewed and there are no changes at this time.  Hgb A1C to be monitored as already arranged by primary service   5. Dyslipidemia Continue statin as ordered and reviewed, no changes at this time     Hortencia Pilar, MD  10/16/2019 1:03 PM

## 2019-10-18 ENCOUNTER — Encounter (INDEPENDENT_AMBULATORY_CARE_PROVIDER_SITE_OTHER): Payer: Self-pay

## 2019-10-21 ENCOUNTER — Encounter: Payer: Self-pay | Admitting: Family Medicine

## 2019-10-21 DIAGNOSIS — G729 Myopathy, unspecified: Secondary | ICD-10-CM | POA: Insufficient documentation

## 2019-10-23 ENCOUNTER — Encounter: Payer: Self-pay | Admitting: Family Medicine

## 2019-10-23 ENCOUNTER — Ambulatory Visit
Admission: RE | Admit: 2019-10-23 | Discharge: 2019-10-23 | Disposition: A | Discharge status: Home / Self Care | Payer: Medicare HMO | Source: Ambulatory Visit | Attending: Internal Medicine | Admitting: Internal Medicine

## 2019-10-23 ENCOUNTER — Other Ambulatory Visit: Payer: Self-pay

## 2019-10-23 DIAGNOSIS — R29898 Other symptoms and signs involving the musculoskeletal system: Secondary | ICD-10-CM | POA: Insufficient documentation

## 2019-10-23 DIAGNOSIS — M48061 Spinal stenosis, lumbar region without neurogenic claudication: Secondary | ICD-10-CM | POA: Diagnosis not present

## 2019-10-27 DIAGNOSIS — M48062 Spinal stenosis, lumbar region with neurogenic claudication: Secondary | ICD-10-CM | POA: Diagnosis not present

## 2019-10-27 DIAGNOSIS — E1142 Type 2 diabetes mellitus with diabetic polyneuropathy: Secondary | ICD-10-CM | POA: Diagnosis not present

## 2019-11-04 ENCOUNTER — Other Ambulatory Visit: Payer: Self-pay

## 2019-11-04 ENCOUNTER — Encounter: Payer: Self-pay | Admitting: Family Medicine

## 2019-11-04 ENCOUNTER — Ambulatory Visit (INDEPENDENT_AMBULATORY_CARE_PROVIDER_SITE_OTHER): Payer: Medicare HMO | Admitting: Family Medicine

## 2019-11-04 VITALS — BP 130/70 | HR 90 | Temp 97.3°F | Resp 18 | Ht 69.0 in | Wt 259.7 lb

## 2019-11-04 DIAGNOSIS — E1142 Type 2 diabetes mellitus with diabetic polyneuropathy: Secondary | ICD-10-CM | POA: Diagnosis not present

## 2019-11-04 DIAGNOSIS — E1169 Type 2 diabetes mellitus with other specified complication: Secondary | ICD-10-CM | POA: Diagnosis not present

## 2019-11-04 DIAGNOSIS — R29898 Other symptoms and signs involving the musculoskeletal system: Secondary | ICD-10-CM

## 2019-11-04 DIAGNOSIS — E785 Hyperlipidemia, unspecified: Secondary | ICD-10-CM

## 2019-11-04 DIAGNOSIS — M48062 Spinal stenosis, lumbar region with neurogenic claudication: Secondary | ICD-10-CM | POA: Diagnosis not present

## 2019-11-04 DIAGNOSIS — G8929 Other chronic pain: Secondary | ICD-10-CM

## 2019-11-04 DIAGNOSIS — E669 Obesity, unspecified: Secondary | ICD-10-CM | POA: Diagnosis not present

## 2019-11-04 DIAGNOSIS — I890 Lymphedema, not elsewhere classified: Secondary | ICD-10-CM | POA: Diagnosis not present

## 2019-11-04 DIAGNOSIS — M4807 Spinal stenosis, lumbosacral region: Secondary | ICD-10-CM | POA: Insufficient documentation

## 2019-11-04 DIAGNOSIS — E559 Vitamin D deficiency, unspecified: Secondary | ICD-10-CM

## 2019-11-04 DIAGNOSIS — G4733 Obstructive sleep apnea (adult) (pediatric): Secondary | ICD-10-CM | POA: Diagnosis not present

## 2019-11-04 DIAGNOSIS — M545 Low back pain: Secondary | ICD-10-CM

## 2019-11-04 DIAGNOSIS — I10 Essential (primary) hypertension: Secondary | ICD-10-CM | POA: Diagnosis not present

## 2019-11-04 DIAGNOSIS — R790 Abnormal level of blood mineral: Secondary | ICD-10-CM

## 2019-11-04 DIAGNOSIS — F331 Major depressive disorder, recurrent, moderate: Secondary | ICD-10-CM

## 2019-11-04 DIAGNOSIS — I714 Abdominal aortic aneurysm, without rupture, unspecified: Secondary | ICD-10-CM

## 2019-11-04 MED ORDER — PREGABALIN 25 MG PO CAPS: 25.0000 mg | ORAL_CAPSULE | Freq: Two times a day (BID) | ORAL | 3 refills | Status: DC

## 2019-11-04 MED ORDER — OXYCODONE-ACETAMINOPHEN 5-325 MG PO TABS: 1.0000 | ORAL_TABLET | Freq: Every day | ORAL | 0 refills | Status: DC | PRN

## 2019-11-04 MED ORDER — BACLOFEN 5 MG PO TABS: 5.0000 mg | ORAL_TABLET | Freq: Two times a day (BID) | ORAL | 3 refills | Status: DC | PRN

## 2019-11-04 NOTE — Progress Notes (Signed)
Name: Duane Hughes   MRN: [id number]    DOB: 1951-04-26   Date:11/04/2019       Progress Note  Chief Complaint  Patient presents with  . Hyperlipidemia    follow up  . Diabetes  . Hypertension    Subjective:   Duane Hughes is a 69 y.o. 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   Severe central canal and bilateral subarticular recess stenosis at  L2-3, L3-4 and L4-5.   Mild to moderate central canal stenosis and narrowing of both  subarticular recesses at L1-2.   Pt has since consulted with neurosurgery at [facility].  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   Severe central canal and bilateral subarticular recess stenosis at  L2-3, L3-4 and L4-5.   Mild to moderate central canal stenosis and narrowing of both  subarticular recesses at L1-2.   Arterial Doppler May 2021 - negative for PAD May 2021 + ANA, normal CRP and ESR, negative rheumatoid factor, low serum ferritin 18, mild CK elevation 554, low vitamin D at 14 03/2019: CK at 617. 10/2018: Serum folate, normal serum protein electrophoresis, normal serum TSH, HbA1c 6.6, normal rheumatoid factor, negative ANA, ESR of 23, normal serum copper, increase serum B1, 201.6, increased from B6 55.6, normal serum B12  Hyperlipidemia:  Holding statin, previously on lipitor 10 mg daily, held with leg pain/weakness elevated CK, I have reached out to Dr. Meda Coffee to inquire about restarting statin.   Last Lipids: Lab Results  Component Value Date   CHOL 175 09/04/2019   HDL 37 (L) 09/04/2019   LDLCALC 108 (H) 09/04/2019   TRIG 189 (H) 09/04/2019   CHOLHDL 4.7 09/04/2019   - Denies: Chest pain, shortness of breath  Hypertension:  Currently managed on  lisinopril 2.5 mg Pt reports good med compliance and denies any SE.  No lightheadedness, hypotension, syncope. Blood pressure today is well controlled. Multiple high readings BP Readings from Last 3 Encounters:  11/04/19 130/70  10/16/19 (!) 164/94  09/18/19 124/68   Pt denies CP, SOB, exertional sx, LE edema, palpitation, Ha's, visual disturbances   Vit D deficiency Last vitamin D Lab Results  Component Value Date   VD25OH 14 (L) 09/04/2019  Give Rx strength supplement in May  DM:   Currently managing with metformin 1000 mg in the am and 500 mg in the pm Pt has no SE from meds. Blood sugars - not checking Denies: Polyuria, polydipsia, vision changes, neuropathy, hypoglycemia Recent pertinent labs: Lab Results  Component Value Date   HGBA1C 6.3 (H) 09/04/2019   HGBA1C 6.5 (H) 03/31/2019   HGBA1C 6.2 (H) 05/30/2018   Standard of care and health maintenance:  All up to date  Urine Microalbumin:  Foot exam:  DM eye exam: ACEI/ARB: lisinopril 2.5 mg  Statin - held see above  Health Maintenance  Topic Date Due  . COVID-19 Vaccine (1) Never done  . INFLUENZA VACCINE  11/30/2019  . OPHTHALMOLOGY EXAM  12/23/2019  . HEMOGLOBIN A1C  03/06/2020  . FOOT EXAM  08/31/2020  . COLONOSCOPY  05/09/2021  . TETANUS/TDAP  03/01/2028  . Hepatitis C Screening  Completed  . PNA vac Low Risk Adult  Completed   Reviewed last vascular OV- stable AAA, will  do one year follow up   He has been referred to spine surgeon - Dr. Adriana Simas with [facility]  Low ferritin - suspected borderline iron deficiency w/o anemia, asked to supplement with iron a few times a week or eat high iron foods/diet  LE edema - pt wearing OTC compression stockings, expensive at medical supply store, he has not change or worsening of LE edema  He is concerned about a refusal to refill oxycodone with some recent mychart messages and requests.  I was not aware he was taking,  his prior PCP sent in #20 in Jan 2020, and he later had surgery and the surgeon managed pain/meds around surgery May to June 2020.  He states he would make 30 pills last a year.  We have not previously discussed his back pain, nor did he endorse chronic back pain.  We have previously discussed leg pain, hip pain and arthralgias and neuropathy.   Discussed chronic pain management today, esp with likely upcoming surgery. 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  In the morning sometimes he has severe back pain or stiffness.  Sometimes back is severe and he has managed with OTC meds, NSAIDs and extra strength tylenol   Previously was offered meds to tx neuropathy but he declined.     Current Outpatient Medications:  .  aspirin EC 81 MG tablet, Take 81 mg by mouth daily., Disp: , Rfl:  .  buPROPion (WELLBUTRIN SR) 150 MG 12 hr tablet, TAKE 1 TABLET BY MOUTH TWICE A DAY, Disp: 180 tablet, Rfl: 0 .  ferrous sulfate 325 (65 FE) MG EC tablet, Take 1 tablet (325 mg total) by mouth 3 (three) times a week., Disp: 36 tablet, Rfl: 3 .  hydrocortisone 2.5 % cream, Apply 1 application topically daily as needed (rash)., Disp: , Rfl:  .  ketoconazole (NIZORAL) 2 % cream, Apply 1 application topically daily as needed (rash)., Disp: , Rfl:  .  lamoTRIgine (LAMICTAL) 200 MG tablet, TAKE 1 TABLET (200 MG TOTAL) BY MOUTH DAILY., Disp: 90 tablet, Rfl: 1 .  lisinopril (ZESTRIL) 5 MG tablet, Take 0.5 tablets (2.5 mg total) by mouth daily., Disp: 45 tablet, Rfl: 3 .  metFORMIN (GLUCOPHAGE) 500 MG tablet, Take 2 tablets with breakfast and 1 tablet with dinner daily., Disp: 270 tablet, Rfl: 3 .  omega-3 acid ethyl esters (LOVAZA) 1 g capsule, Take by mouth 2 (two) times daily., Disp: , Rfl:  .  Omega-3 Fatty Acids (FISH OIL) 1200 MG CAPS, Take 1,200 mg by mouth daily., Disp: , Rfl:  .  pramipexole (MIRAPEX) 0.5 MG tablet, TAKE 1 TABLET (0.5 MG TOTAL) BY MOUTH 2  (TWO) TIMES A DAY., Disp: 180 tablet, Rfl: 1 .  sildenafil (REVATIO) 20 MG tablet, Take 3 to 5 tablets two hours before intercouse on an empty stomach.  Do not take with nitrates., Disp: 50 tablet, Rfl: 3 .  Vitamin D, Ergocalciferol, (DRISDOL) 1.25 MG (50000 UNIT) CAPS capsule, Take 1 capsule (50,000 Units total) by mouth every 7 (seven) days. x12 weeks., Disp: 12 capsule, Rfl: 1 .  b complex vitamins capsule, Take 1 capsule by mouth daily. (Patient not taking: Reported on 11/04/2019), Disp: 30 capsule, Rfl: 11 .  ezetimibe (ZETIA) 10 MG tablet, Take 1 tablet (10 mg total) by mouth daily., Disp: 90 tablet, Rfl: 3 .  oxyCODONE (OXY IR/ROXICODONE) 5 MG immediate release tablet, oxycodone 5 mg tablet  1-2 po q 6 hrs prn pain, Disp: , Rfl:  .  TURMERIC PO, Take by mouth., Disp: , Rfl:   Patient Active Problem List   Diagnosis Date Noted  . Spinal stenosis of lumbosacral region 11/04/2019  . Myopathy, unspecified 10/21/2019  . Lymphedema 10/16/2019  . Leg weakness, bilateral 10/08/2019  . Positive ANA (antinuclear antibody) 10/08/2019  . Diabetic polyneuropathy associated with type 2 diabetes mellitus (HCC) 03/05/2019  . Paronychia of great toe of right foot 01/30/2019  . Status post total left knee replacement 09/24/2018  . Abdominal aortic aneurysm (HCC) 09/24/2018  . Peripheral neuropathy 10/29/2017  . Hx of total knee arthroplasty, right 04/04/2017  . LAE (left atrial enlargement) 03/08/2017  . Right atrial enlargement 03/08/2017  . Vitamin D deficiency 11/21/2016  . Hx of hypogonadism 11/17/2016  . Benign neoplasm of ascending colon   . Bilateral low back pain with bilateral sciatica 03/26/2016  . 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 Dyslipidemia 06/22/2015  . Diabetes mellitus type 2 in obese (HCC) 03/19/2015  . Obstructive sleep apnea 03/18/2015  . Morbid obesity (HCC) 11/11/2014  . Major depressive disorder, recurrent (HCC) 11/11/2014  . Benign  hypertension 11/11/2014    Past Surgical History:  Procedure Laterality Date  . COLONOSCOPY WITH PROPOFOL N/A 05/09/2016   Procedure: COLONOSCOPY WITH PROPOFOL;  Surgeon: Midge Minium, MD;  Location: [facility] END[facility];  Service: Endoscopy;  Laterality: N/A;  . HERNIA REPAIR     [place]  . JOINT REPLACEMENT     04-04-17 Dr. Darrelyn Hillock   Right knee  . KNEE ARTHROSCOPY     several scopes done  . KNEE CLOSED REDUCTION Right 05/31/2017   Procedure: CLOSED MANIPULATION RIGHT KNEE;  Surgeon: Ranee Gosselin, MD;  Location: [facility];  Service: Orthopedics;  Laterality: Right;  Femoral Block  . MEDIAL PARTIAL KNEE REPLACEMENT Left    2000s?  Marland Kitchen SKIN CANCER EXCISION  Feb 2017   removed from right hand  . TOTAL KNEE ARTHROPLASTY Right 04/04/2017   Procedure: RIGHT TOTAL KNEE ARTHROPLASTY;  Surgeon: Ranee Gosselin, MD;  Location: [facility];  Service: Orthopedics;  Laterality: Right;  . TOTAL KNEE ARTHROPLASTY Left 09/24/2018   Procedure: REVISION LEFT KNEE ARTHROPLASTY;  Surgeon: Durene Romans, MD;  Location: [facility];  Service: Orthopedics;  Laterality: Left;  70 mins    Family History  Problem Relation Age of Onset  . Alcohol abuse Mother   . Rheum arthritis Mother   . Stroke Maternal Grandmother   . Stroke Paternal Grandfather   . Dementia Paternal Grandmother   . Cancer Neg Hx   . Diabetes Neg Hx   . Heart disease Neg Hx   . Hypertension Neg Hx   . Kidney disease Neg Hx   . Prostate cancer Neg Hx     Social History   Tobacco Use  . Smoking status: Never Smoker  . Smokeless tobacco: Never Used  Vaping Use  . Vaping Use: Never used  Substance Use Topics  . Alcohol use: No    Alcohol/week: 0.0 standard drinks  . Drug use: No     No Known Allergies  Health Maintenance  Topic Date Due  . COVID-19 Vaccine (1) Never done  . INFLUENZA VACCINE  11/30/2019  . OPHTHALMOLOGY EXAM  12/23/2019  . HEMOGLOBIN A1C  03/06/2020  . FOOT EXAM  08/31/2020  . COLONOSCOPY  05/09/2021  . TETANUS/TDAP   03/01/2028  . Hepatitis C Screening  Completed  . PNA vac Low Risk Adult  Completed  Chart Review Today: I personally reviewed active problem list, medication list, allergies, family history, social history, health maintenance, notes from last encounter, lab results, imaging with the patient/caregiver today.   Review of Systems  10 Systems reviewed and are negative for acute change except as noted in the HPI.  Objective:   Vitals:   11/04/19 1418  BP: 130/70  Pulse: 90  Resp: 18  Temp: (!) 97.3 F (36.3 C)  TempSrc: Temporal  SpO2: 96%  Weight: 259 lb 11.2 oz (117.8 kg)  Height: '5\' 9"'$  (1.753 m)    Body mass index is 38.35 kg/m.  Physical Exam Vitals and nursing note reviewed.  Constitutional:      General: He is not in acute distress.    Appearance: Normal appearance. He is well-developed. He is obese. He is not ill-appearing, toxic-appearing or diaphoretic.     Comments: HOH  HENT:     Head: Normocephalic and atraumatic.     Jaw: No trismus.     Right Ear: External ear normal.     Left Ear: External ear normal.     Nose: Nose normal.     Mouth/Throat:     Mouth: Mucous membranes are moist.     Pharynx: Oropharynx is clear.  Eyes:     General: Lids are normal. No scleral icterus.    Conjunctiva/sclera: Conjunctivae normal.     Pupils: Pupils are equal, round, and reactive to light.  Neck:     Trachea: Trachea and phonation normal. No tracheal deviation.  Cardiovascular:     Rate and Rhythm: Normal rate and regular rhythm.     Pulses: Normal pulses.          Radial pulses are 2+ on the right side and 2+ on the left side.     Heart sounds: Normal heart sounds. No murmur heard.  No friction rub. No gallop.   Pulmonary:     Effort: Pulmonary effort is normal. No respiratory distress.     Breath sounds: Normal breath sounds. No stridor. No wheezing, rhonchi or rales.  Abdominal:     General: Bowel sounds are normal. There is no distension.     Palpations:  Abdomen is soft.     Comments: Obese abd  Musculoskeletal:     Right lower leg: No edema.     Left lower leg: No edema.     Comments: Wearing white tall socks  Skin:    General: Skin is warm and dry.     Coloration: Skin is not jaundiced.     Findings: No rash.     Nails: There is no clubbing.  Neurological:     Mental Status: He is alert. Mental status is at baseline.     Cranial Nerves: No dysarthria or facial asymmetry.     Motor: No tremor or abnormal muscle tone.  Psychiatric:        Mood and Affect: Mood normal.        Speech: Speech normal.        Behavior: Behavior normal. Behavior is cooperative.         Assessment & Plan:     ICD-10-CM   1. Dyslipidemia  Z66.2 COMPLETE METABOLIC PANEL WITH GFR   last LDL elevated - pt was holding statin, letter sent to Dr. Meda Coffee, will recheck labs if able to restart statin- per specialist  2. Benign hypertension  H47 COMPLETE METABOLIC PANEL WITH GFR   stable, well controlled today, continue lisinopril same dose  3.  Weakness of both lower extremities  R29.898    secondary to central canal stenosis, lumbosacral spinal stenosis  4. Vitamin D deficiency  N27.7 COMPLETE METABOLIC PANEL WITH GFR    VITAMIN D 25 Hydroxy (Vit-D Deficiency, Fractures)   pt completed supplement, recheck  5. Low ferritin  R79.0 CBC with Differential/Platelet    Iron, TIBC and Ferritin Panel   low, pt has supplemented iron PO 3x a week, recheck iron panel today  6. Diabetes mellitus type 2 in obese (HCC)  O24.23 COMPLETE METABOLIC PANEL WITH GFR   E66.9    well controlled on metformin  7. Lymphedema  I89.0    per vascular - conservative measures, Rx compression stockings  8. Diabetic polyneuropathy associated with type 2 diabetes mellitus (HCC)  E11.42 pregabalin (LYRICA) 25 MG capsule    oxyCODONE-acetaminophen (PERCOCET/ROXICET) 5-325 MG tablet   currently managable, has declined meds in the past, no severe neuropathy causing pain or waking from sleep,  more concerns re numbness  9. Spinal stenosis of lumbosacral region  M48.07 pregabalin (LYRICA) 25 MG capsule    oxyCODONE-acetaminophen (PERCOCET/ROXICET) 5-325 MG tablet   referred to spinal surgeon, June 2021 lumbar MRI reviewed, neurogenic claudication and weakness, no saddle anesthesia or incontinence stool/urine  10. Obstructive sleep apnea  G47.33    previously managed by ENT - Dr. Pryor Ochoa, compliant with CPAP daily  11. Abdominal aortic aneurysm (AAA) without rupture (HCC)  I71.4    completed annual f/up with vascular specialist, Dr. Delana Meyer, no change, stable, f/up 1 year  12. Chronic bilateral low back pain, unspecified whether sciatica present  M54.5 pregabalin (LYRICA) 25 MG capsule   G89.29 oxyCODONE-acetaminophen (PERCOCET/ROXICET) 5-325 MG tablet   est with spine specialist - they are discussing and likely going to do laminectomy   13. Encounter for chronic pain management  G89.29 pregabalin (LYRICA) 25 MG capsule    oxyCODONE-acetaminophen (PERCOCET/ROXICET) 5-325 MG tablet   discussed: see below, contract signed today  14. Moderate episode of recurrent major depressive disorder (HCC) Chronic F33.1    per psych NP   Indication for chronic opioid: chronic back pain Medication and dose: oxycodone-acetaminophen 5-325 mg - take sparingly # pills per month: #30 for 3 months - for the way the pt endorses having been using it over the past year (PDMP reviewed since 05/2018, pt had total of 200 percocet and oxy 5 mg prescribed by Dr. Sanda Klein and his surgeon, he states he mad 30 pills last a year. Last UDS date:  none Opioid Treatment Agreement signed (Y/N): yes - signed today - will upload to chart Opioid Treatment Agreement last reviewed with patient:  yes, reviewed today Reserve reviewed this encounter (include red flags): Yes  Discussed other pain management meds and treatment, goals of management - to improve QOL and improve pt's function, pain should be minimized but I explained to pt  that pain will not be 0/10, and a 30-50% reduction in pain is extremely successful. Pt advised to use OTC meds first, trial of baclofen and lyrica He was encouraged to contact spine surgeon re: opioid use near surgery  Return for 4 month DM, HLD, HTN, iron deficiency -  (sooner or 78moappt for pain mgmt).   LDelsa Grana PA-C 11/04/19 2:40 PM

## 2019-11-04 NOTE — Patient Instructions (Addendum)
I recommend we get a pain management plan in place that will address chronic back and/or muscleskeletal pain.  Meds include cymbalta, lyrica or gabapentin, NSAIDs, muscle relaxers and last resort opioid pain meds.  For now try baclofen (muscle relaxer) Continue to use over the counter tylenol (max 4000 mg in 24 hours - 3000 to be safe) and NSAIDs (like ibuprofen, aleve, naproxen)  Ask you Psych NP about cymbalta and if she thinks this will work well with your other meds - we use it for chronic pain management  Lyrica is very effective for muscloskeletal pain and post op pain.  Percocet 5-325, I will send in #30, and I will expect that you can make these last for about 3 months.   I anticipate this will change around your surgery and the post-op period afterwards.     Opioid Pain Medicine Management Opioid pain medicines are strong medicines that are used to treat bad or very bad pain. When you take them for a short time, they can help you:  Sleep better.  Do better in physical therapy.  Feel better during the first few days after you get hurt.  Recover from surgery. Only take these medicines if a doctor says that you can. You should only take them for a short time. This is because opioids can be hard to stop taking (they are addictive). The longer you take opioids, the harder it may be to stop taking them (opioid use disorder). What are the risks? Opioids can cause problems (side effects). Taking them for more than 3 days raises your chance of problems, such as:  Trouble pooping (constipation).  Feeling sick to your stomach (nausea).  Vomiting.  Feeling very sleepy.  Confusion.  Not being able to stop taking the medicine.  Breathing problems. Taking opioids for a long time can make it hard for you to do daily tasks. It can also put you at risk for:  Car accidents.  Depression.  Suicide.  Heart attack.  Taking too much of the medicine (overdose), which can sometimes  lead to death. What is a pain treatment plan? A pain treatment plan is a plan made by you and your doctor. Work with your doctor to make a plan for treating your pain. To help you do this:  Talk about the goals of your treatment, including: ? How much pain you might expect to have. ? How you will manage the pain.  Talk about the risks and benefits of taking these medicines for your condition.  Remember that a good treatment plan uses more than one approach and lowers the risks of side effects.  Tell your doctor about the amount of medicines you take and about any drug or alcohol use.  Get your pain medicine prescriptions from only one doctor. Pain can be managed with other treatments. Work with your doctor to find other ways to help your pain, such as:  Physical therapy.  Counseling.  Eating healthy foods.  Brain exercises.  Massage.  Meditation.  Other pain medicines.  Doing gentle exercises. Tapering your use of opioids If you have been taking opioids for more than a few weeks, you may need to slowly decrease (taper) how much you take until you stop taking them. Doing this can lower your chance of having symptoms, such as:  Pain and cramping in your belly (abdomen).  Feeling sick to your stomach.  Sweating.  Feeling very sleepy.  Feeling restless.  Shaking you cannot control (tremors).  Cravings for the  medicine. Do not try to stop taking them by yourself. Work with your doctor to stop. Your doctor will help you take less until you are not taking the medicine at all. Follow these instructions at home: Safety and storage   While you are taking opioids: ? Do not drive. ? Do not use machines or power tools. ? Do not sign important papers (legal documents). ? Do not drink alcohol. ? Do not take sleeping pills. ? Do not take care of children by yourself. ? Do not do activities where you need to climb or be in high places, like working on a ladder. ? Do not go  into any water, such as a lake, river, ocean, swimming pool, or hot tub.  Keep your opioids locked up or in a place where children cannot reach them.  Do not share your pain medicine with anyone. Getting rid of leftover pills Do not save any leftover pills. Get rid of leftover pills safely by:  Taking them to a take-back program in your area.  Bringing them to a pharmacy that has a container for throwing away pills (pill disposal).  Throwing them in the trash. Check the label or package insert of your medicine to see whether this is safe to do. If it is safe to throw them out: 1. Take the pills out of their container. 2. Mix the pills with pet poop or food scraps. 3. Put this in the trash. Activity  Return to your normal activities as told by your doctor. Ask your doctor what activities are safe for you.  Avoid doing things that make your pain worse.  Do exercises as told by your doctor. General instructions  You may need to take these actions to prevent or treat trouble pooping: ? Drink enough fluid to keep your pee (urine) pale yellow. ? Take over-the-counter or prescription medicines. ? Eat foods that are high in fiber. These include beans, whole grains, and fresh fruits and vegetables. ? Limit foods that are high in fat and sugar. These include fried or sweet foods.  Keep all follow-up visits as told by your doctor. This is important. Where to find support If you have been taking opioids for a long time, think about getting help quitting from a local support group or counselor. Ask your doctor about this. Where to find more information Centers for Disease Control and Prevention (CDC): http://www.wolf.info/ Get help right away if: Seek medical care right away if you are taking opioids and you, or people close to you, notice any of the following:  You have trouble breathing.  Your breathing is slower or more shallow than normal.  You have a very slow heartbeat.  You feel very  confused.  You pass out (faint).  You are very sleepy.  Your speech is not normal.  You feel sick to your stomach and vomit.  You have cold skin.  You have blue lips or fingernails.  Your muscles are weak (limp) and your body seems floppy.  The black centers of your eyes (pupils) are smaller than normal. If you think that you or someone else may have taken too much of an opioid medicine, get medical help right away. Call your local emergency services (911 in the [place] Do not drive yourself to the hospital. If you ever feel like you may hurt yourself or others, or have thoughts about taking your own life, get help right away. You can go to your nearest emergency department or call:  Your local  emergency services (911 in the [place]  The hotline of the [facility] 954-255-9720 in the [place]  A suicide crisis helpline, such as the [place] at (959) 819-6166. This is open 24 hours a day. Summary  Opioid are strong medicines that are used to treat bad or very bad pain.  A pain treatment plan is a plan made by you and your doctor. Work with your doctor to make a plan for treating your pain.  Work with your doctor to find other ways to help your pain.  If you think that you or someone else may have taken too much of an opioid, get help right away. This information is not intended to replace advice given to you by your health care provider. Make sure you discuss any questions you have with your health care provider. Document Revised: 05/17/2018 Document Reviewed: 05/17/2018 Elsevier Patient Education  [place].

## 2019-11-05 LAB — COMPLETE METABOLIC PANEL WITH GFR
AG Ratio: 2.1 (calc) (ref 1.0–2.5)
ALT: 40 U/L (ref 9–46)
AST: 30 U/L (ref 10–35)
Albumin: 3.8 g/dL (ref 3.6–5.1)
Alkaline phosphatase (APISO): 59 U/L (ref 35–144)
BUN: 15 mg/dL (ref 7–25)
CO2: 24 mmol/L (ref 20–32)
Calcium: 8.8 mg/dL (ref 8.6–10.3)
Chloride: 108 mmol/L (ref 98–110)
Creat: 0.98 mg/dL (ref 0.70–1.25)
GFR, Est African American: 91 mL/min/{1.73_m2} (ref >=60)
GFR, Est Non African American: 78 mL/min/{1.73_m2} (ref >=60)
Globulin: 1.8 g/dL (calc) — ABNORMAL LOW (ref 1.9–3.7)
Glucose, Bld: 148 mg/dL — ABNORMAL HIGH (ref 65–99)
Potassium: 4 mmol/L (ref 3.5–5.3)
Sodium: 141 mmol/L (ref 135–146)
Total Bilirubin: 0.4 mg/dL (ref 0.2–1.2)
Total Protein: 5.6 g/dL — ABNORMAL LOW (ref 6.1–8.1)

## 2019-11-05 LAB — IRON,TIBC AND FERRITIN PANEL
%SAT: 18 % (calc) — ABNORMAL LOW (ref 20–48)
Ferritin: 20 ng/mL — ABNORMAL LOW (ref 24–380)
Iron: 67 ug/dL (ref 50–180)
TIBC: 370 mcg/dL (calc) (ref 250–425)

## 2019-11-05 LAB — CBC WITH DIFFERENTIAL/PLATELET
Absolute Monocytes: 422 cells/uL (ref 200–950)
Basophils Absolute: 40 cells/uL (ref 0–200)
Basophils Relative: 0.7 %
Eosinophils Absolute: 251 cells/uL (ref 15–500)
Eosinophils Relative: 4.4 %
HCT: 45.8 % (ref 38.5–50.0)
Hemoglobin: 15.4 g/dL (ref 13.2–17.1)
Lymphs Abs: 1220 cells/uL (ref 850–3900)
MCH: 31.5 pg (ref 27.0–33.0)
MCHC: 33.6 g/dL (ref 32.0–36.0)
MCV: 93.7 fL (ref 80.0–100.0)
MPV: 9.1 fL (ref 7.5–12.5)
Monocytes Relative: 7.4 %
Neutro Abs: 3768 cells/uL (ref 1500–7800)
Neutrophils Relative %: 66.1 %
Platelets: 271 10*3/uL (ref 140–400)
RBC: 4.89 10*6/uL (ref 4.20–5.80)
RDW: 12.7 % (ref 11.0–15.0)
Total Lymphocyte: 21.4 %
WBC: 5.7 10*3/uL (ref 3.8–10.8)

## 2019-11-05 LAB — VITAMIN D 25 HYDROXY (VIT D DEFICIENCY, FRACTURES): Vit D, 25-Hydroxy: 27 ng/mL — ABNORMAL LOW (ref 30–100)

## 2019-11-06 ENCOUNTER — Encounter: Payer: Self-pay | Admitting: Family Medicine

## 2019-11-10 ENCOUNTER — Ambulatory Visit: Payer: Medicare HMO | Admitting: Neurology

## 2019-11-13 ENCOUNTER — Encounter: Payer: Self-pay | Admitting: Podiatry

## 2019-11-13 ENCOUNTER — Ambulatory Visit: Payer: Medicare HMO | Admitting: Podiatry

## 2019-11-13 ENCOUNTER — Other Ambulatory Visit: Payer: Self-pay

## 2019-11-13 DIAGNOSIS — E1169 Type 2 diabetes mellitus with other specified complication: Secondary | ICD-10-CM | POA: Diagnosis not present

## 2019-11-13 DIAGNOSIS — M79676 Pain in unspecified toe(s): Secondary | ICD-10-CM

## 2019-11-13 DIAGNOSIS — B351 Tinea unguium: Secondary | ICD-10-CM | POA: Diagnosis not present

## 2019-11-13 DIAGNOSIS — E669 Obesity, unspecified: Secondary | ICD-10-CM | POA: Diagnosis not present

## 2019-11-13 NOTE — Progress Notes (Signed)
This patient returns to my office for at risk foot care.  This patient requires this care by a professional since this patient will be at risk due to having diabetes.  This patient is unable to cut nails himself since the patient cannot reach his nails.These nails are painful walking and wearing shoes.  This patient presents for at risk foot care today.  General Appearance  Alert, conversant and in no acute stress.  Vascular  Dorsalis pedis and posterior tibial  pulses are palpable  bilaterally.  Capillary return is within normal limits  bilaterally. Temperature is within normal limits  bilaterally.  Neurologic  Senn-Weinstein monofilament wire test within normal limits  bilaterally. Muscle power within normal limits bilaterally.  Nails Thick disfigured discolored nails with subungual debris  from hallux to fifth toes bilaterally. No evidence of bacterial infection or drainage bilaterally.  Orthopedic  No limitations of motion  feet .  No crepitus or effusions noted.  No bony pathology or digital deformities noted.  Skin  normotropic skin with no porokeratosis noted bilaterally.  No signs of infections or ulcers noted.     Onychomycosis  Pain in right toes  Pain in left toes  Consent was obtained for treatment procedures.   Mechanical debridement of nails 1-5  bilaterally performed with a nail nipper.  Filed with dremel without incident.    Return office visit  10 weeks                    Told patient to return for periodic foot care and evaluation due to potential at risk complications.   Reesa Gotschall DPM   

## 2019-11-17 ENCOUNTER — Telehealth: Payer: Self-pay | Admitting: Family Medicine

## 2019-11-17 DIAGNOSIS — R748 Abnormal levels of other serum enzymes: Secondary | ICD-10-CM

## 2019-11-17 DIAGNOSIS — Z5181 Encounter for therapeutic drug level monitoring: Secondary | ICD-10-CM

## 2019-11-17 DIAGNOSIS — E785 Hyperlipidemia, unspecified: Secondary | ICD-10-CM

## 2019-11-17 MED ORDER — ATORVASTATIN CALCIUM 10 MG PO TABS: 10.0000 mg | ORAL_TABLET | Freq: Every day | ORAL | 3 refills | Status: DC

## 2019-11-17 NOTE — Telephone Encounter (Addendum)
Copied from [place] [id number]. Topic: General - Other >> Nov 04, 2019  4:45 PM Gillis Ends D wrote: Reason for CRM: Dr.  Posey Pronto said that it's okay for the patient to restart the statin. He also said that the Creatine Kinase and Aldolase levels should be rechecked 1 month after starting the statin  lipitor 10 mg was sent into the pharmacy and back on active med list. Labs ordered per Dr. Posey Pronto, and staff asked to call pt to update restarting meds and f/up labs    ICD-10-CM   1. Dyslipidemia  E78.5 atorvastatin (LIPITOR) 10 MG tablet    COMPLETE METABOLIC PANEL WITH GFR    CK (Creatine Kinase)    Aldolase  2. Encounter for medication monitoring  S12.82 COMPLETE METABOLIC PANEL WITH GFR    CK (Creatine Kinase)    Aldolase  3. Elevated CK  K81.3 COMPLETE METABOLIC PANEL WITH GFR    CK (Creatine Kinase)    Aldolase

## 2019-11-18 NOTE — Telephone Encounter (Signed)
pt notified

## 2019-11-25 DIAGNOSIS — G4733 Obstructive sleep apnea (adult) (pediatric): Secondary | ICD-10-CM | POA: Diagnosis not present

## 2019-11-25 DIAGNOSIS — H1132 Conjunctival hemorrhage, left eye: Secondary | ICD-10-CM | POA: Diagnosis not present

## 2019-11-27 DIAGNOSIS — R748 Abnormal levels of other serum enzymes: Secondary | ICD-10-CM | POA: Diagnosis not present

## 2019-11-27 DIAGNOSIS — R768 Other specified abnormal immunological findings in serum: Secondary | ICD-10-CM | POA: Diagnosis not present

## 2019-11-27 DIAGNOSIS — M76891 Other specified enthesopathies of right lower limb, excluding foot: Secondary | ICD-10-CM | POA: Diagnosis not present

## 2019-11-27 DIAGNOSIS — M1611 Unilateral primary osteoarthritis, right hip: Secondary | ICD-10-CM | POA: Diagnosis not present

## 2019-11-27 DIAGNOSIS — M25551 Pain in right hip: Secondary | ICD-10-CM | POA: Diagnosis not present

## 2019-11-27 DIAGNOSIS — G8929 Other chronic pain: Secondary | ICD-10-CM | POA: Diagnosis not present

## 2019-11-27 DIAGNOSIS — R5382 Chronic fatigue, unspecified: Secondary | ICD-10-CM | POA: Diagnosis not present

## 2019-11-27 DIAGNOSIS — M48062 Spinal stenosis, lumbar region with neurogenic claudication: Secondary | ICD-10-CM | POA: Diagnosis not present

## 2019-12-02 ENCOUNTER — Encounter: Payer: Self-pay | Admitting: Family Medicine

## 2019-12-09 NOTE — Progress Notes (Signed)
5:06 PM   Duane Hughes Sep 30, 1950 [id number]  Referring provider: Delsa Grana, PA-C [address] [place],  [place] [address]  Chief Complaint  Patient presents with  . Erectile Dysfunction    HPI: Patient is a 69 year old male with ED and BPH with LU TS who presents today to discuss testosterone treatment.    Patient was seen on 11/27/2019 for various MSK complaints and fatigue.  He has sleep apnea and sleeps with a CPAP.  A mid morning testosterone was obtained and it was noted to be 228.9.  Erectile dysfunction His SHIM score is 25,  which is no erectile dysfunction.   His previous SHIM score was 24.  His libido is preserved.  His risk factors for ED are age, BPH, DM, HTN, HLD, sleep apnea, depression, antidepressants, pain medication and blood pressure medications. He denies any painful erections or curvatures with his erections.   He has tried PDE5-inhibitors in the past with success and continues them now.     SHIM    Row Name 12/10/19 1325         SHIM: Over the last 6 months:   How do you rate your confidence that you could get and keep an erection? Very High     When you had erections with sexual stimulation, how often were your erections hard enough for penetration (entering your partner)? Almost Always or Always     During sexual intercourse, how often were you able to maintain your erection after you had penetrated (entered) your partner? Almost Always or Always     During sexual intercourse, how difficult was it to maintain your erection to completion of intercourse? Not Difficult     When you attempted sexual intercourse, how often was it satisfactory for you? Almost Always or Always       SHIM Total Score   SHIM 25            Score: 1-7 Severe ED 8-11 Moderate ED 12-16 Mild-Moderate ED 17-21 Mild ED 22-25 No ED  BPH WITH LUTS His IPSS score today is 13, which is moderate lower urinary tract symptomatology.  He is mostly satisfied  with his quality life due to his urinary symptoms.  His main complaint is urinary frequency.  His previous IPSS score was 11/1.  His previous PVR is 0 mL.    UA is clear   IPSS    Row Name 12/10/19 1300         International Prostate Symptom Score   How often have you had the sensation of not emptying your bladder? About half the time     How often have you had to urinate less than every two hours? Less than half the time     How often have you found you stopped and started again several times when you urinated? Less than 1 in 5 times     How often have you found it difficult to postpone urination? Less than half the time     How often have you had a weak urinary stream? More than half the time     How often have you had to strain to start urination? Not at All     How many times did you typically get up at night to urinate? 1 Time     Total IPSS Score 13       Quality of Life due to urinary symptoms   If you were to spend the rest of  your life with your urinary condition just the way it is now how would you feel about that? Mostly Satisfied            Score:  1-7 Mild 8-19 Moderate 20-35 Severe   PMH: Past Medical History:  Diagnosis Date  . Anxiety    Dr.Jessica Eulas Post . well controlled  . Arthritis    back, hands, knees  . Colon polyp 05/09/2016   TUBULAR ADENOMAS   . Depression   . Diabetes mellitus without complication ([place])    type 2  . Ectatic abdominal aorta ([place]) 04/18/2016   2.9 cm on [place] 2016, unchanged 2017  . History of hiatal hernia    20 years ago  . History of skin cancer    hand  . Hyperlipidemia   . Hypertension 2015   Dr. Sanda Klein  . Morbid obesity ([place]) 11/11/2014  . Neuropathy   . Obesity 11/11/2014  . Sleep apnea    cpap  . Squamous cell carcinoma of skin 04/11/2018   SCC In Situ R dorsum wrist  . Squamous cell carcinoma of skin 06/23/2015   R dorsum hand    Surgical History: Past Surgical History:  Procedure Laterality Date  .  COLONOSCOPY WITH PROPOFOL N/A 05/09/2016   Procedure: COLONOSCOPY WITH PROPOFOL;  Surgeon: Lucilla Lame, MD;  Location: [facility] ENDOSCOPY;  Service: Endoscopy;  Laterality: N/A;  . HERNIA REPAIR     Hiatal  . JOINT REPLACEMENT     04-04-17 Dr. Gladstone Lighter   Right knee  . KNEE ARTHROSCOPY     several scopes done  . KNEE CLOSED REDUCTION Right 05/31/2017   Procedure: CLOSED MANIPULATION RIGHT KNEE;  Surgeon: Latanya Maudlin, MD;  Location: [facility];  Service: Orthopedics;  Laterality: Right;  Femoral Block  . MEDIAL PARTIAL KNEE REPLACEMENT Left    2000s?  Marland Kitchen SKIN CANCER EXCISION  Feb 2017   removed from right hand  . 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 TOTAL KNEE ARTHROPLASTY Left 09/24/2018   Procedure: REVISION LEFT KNEE ARTHROPLASTY;  Surgeon: Paralee Cancel, MD;  Location: [facility];  Service: Orthopedics;  Laterality: Left;  70 mins    Home Medications:  Allergies as of 12/10/2019   No Known Allergies     Medication List       Accurate as of December 10, 2019  5:06 PM. If you have any questions, ask your nurse or doctor.        STOP taking these medications   b complex vitamins capsule Stopped by: Camran Keady, PA-C   ezetimibe 10 MG tablet Commonly known as: Zetia Stopped by: Zara Council, PA-C     TAKE these medications   aspirin EC 81 MG tablet Take 81 mg by mouth daily.   atorvastatin 10 MG tablet Commonly known as: LIPITOR Take 1 tablet (10 mg total) by mouth at bedtime.   Baclofen 5 MG Tabs Take 5 mg by mouth 2 (two) times daily as needed.   buPROPion 150 MG 12 hr tablet Commonly known as: WELLBUTRIN SR TAKE 1 TABLET BY MOUTH TWICE A DAY   ferrous sulfate 325 (65 FE) MG EC tablet Take 1 tablet (325 mg total) by mouth 3 (three) times a week.   Fish Oil 1200 MG Caps Take 1,200 mg by mouth daily.   hydrocortisone 2.5 %  cream Apply 1 application topically daily as needed (  rash).   ketoconazole 2 % cream Commonly known as: NIZORAL Apply 1 application topically daily as needed (rash).   lamoTRIgine 200 MG tablet Commonly known as: LAMICTAL TAKE 1 TABLET (200 MG TOTAL) BY MOUTH DAILY.   lisinopril 5 MG tablet Commonly known as: ZESTRIL Take 0.5 tablets (2.5 mg total) by mouth daily.   metFORMIN 500 MG tablet Commonly known as: GLUCOPHAGE Take 2 tablets with breakfast and 1 tablet with dinner daily.   omega-3 acid ethyl esters 1 g capsule Commonly known as: LOVAZA Take by mouth 2 (two) times daily.   oxyCODONE-acetaminophen 5-325 MG tablet Commonly known as: PERCOCET/ROXICET Take 1 tablet by mouth daily as needed for severe pain. Do not take with Tylenol as this tablet already contains tylenol   pramipexole 0.5 MG tablet Commonly known as: MIRAPEX TAKE 1 TABLET (0.5 MG TOTAL) BY MOUTH 2 (TWO) TIMES A DAY.   pregabalin 25 MG capsule Commonly known as: LYRICA Take 1 capsule (25 mg total) by mouth 2 (two) times daily.   sildenafil 20 MG tablet Commonly known as: REVATIO Take 3 to 5 tablets two hours before intercouse on an empty stomach.  Do not take with nitrates.   TURMERIC PO Take by mouth.   Vitamin D (Ergocalciferol) 1.25 MG (50000 UNIT) Caps capsule Commonly known as: DRISDOL Take 1 capsule (50,000 Units total) by mouth every 7 (seven) days. x12 weeks.       Allergies: No Known Allergies  Family History: Family History  Problem Relation Age of Onset  . Alcohol abuse Mother   . Rheum arthritis Mother   . Stroke Maternal Grandmother   . Stroke Paternal Grandfather   . Dementia Paternal Grandmother   . Cancer Neg Hx   . Diabetes Neg Hx   . Heart disease Neg Hx   . Hypertension Neg Hx   . Kidney disease Neg Hx   . Prostate cancer Neg Hx     Social History:  reports that he has never smoked. He has never used smokeless tobacco. He reports that he does not drink alcohol  and does not use drugs.  ROS: For pertinent review of systems please refer to history of present illness  Physical Exam: BP (!) 144/85   Pulse 85   Temp 98.3 F (36.8 C)   Ht 5\' 9"  (1.753 m)   Wt 261 lb (118.4 kg)   SpO2 93%   BMI 38.54 kg/m   Constitutional:  Well nourished. Alert and oriented, No acute distress. HEENT: [place] AT, mask in place.  Trachea midline Cardiovascular: No clubbing, cyanosis, or edema. Respiratory: Normal respiratory effort, no increased work of breathing. Neurologic: Grossly intact, no focal deficits, moving all 4 extremities. Psychiatric: Normal mood and affect.  Laboratory Data: Urinalysis Component     Latest Ref Rng & Units 12/10/2019  Specific Gravity, UA     1.005 - 1.030 >1.030 (H)  pH, UA     5.0 - 7.5 6.0  Color, UA     Yellow Yellow  Appearance Ur     Clear Clear  Leukocytes,UA     Negative Negative  Protein,UA     Negative/Trace Negative  Glucose, UA     Negative Negative  Ketones, UA     Negative Negative  RBC, UA     Negative Trace (A)  Bilirubin, UA     Negative Negative  Urobilinogen, Ur     0.2 - 1.0 mg/dL 1.0  Nitrite, UA     Negative Negative  Microscopic  Examination      See below:   Component     Latest Ref Rng & Units 12/10/2019  WBC, UA     0 - 5 /hpf 0-5  RBC     0 - 2 /hpf 0-2  Epithelial Cells (non renal)     0 - 10 /hpf 0-10  Bacteria, UA     None seen/Few None seen   Lab Results  Component Value Date   WBC 5.7 11/04/2019   HGB 15.4 11/04/2019   HCT 45.8 11/04/2019   MCV 93.7 11/04/2019   PLT 271 11/04/2019    Lab Results  Component Value Date   CREATININE 0.98 11/04/2019    PSA History  Component     Latest Ref Rng & Units 11/11/2014 03/19/2015 02/28/2016 03/01/2017  Prostate Specific Ag, Serum     0.0 - 4.0 ng/mL 0.8 0.8 1.0 0.2   Component     Latest Ref Rng & Units 02/25/2018 03/03/2019  Prostate Specific Ag, Serum     0.0 - 4.0 ng/mL 0.2 0.2    Lab Results  Component Value Date    HGBA1C 6.3 (H) 09/04/2019    Lab Results  Component Value Date   TSH 3.13 09/04/2019    Lab Results  Component Value Date   AST 30 11/04/2019   Lab Results  Component Value Date   ALT 40 11/04/2019   I have reviewed the labs.  Assessment & Plan:    1. Testosterone deficiency I explained to patient that the current guidelines from the [place] reports the diagnosis of hypogonadism requires a morning serum total testosterone level at least 2 days apart that is below 300 ng/dL - most insurances require the blood work to be done before 9 am.   At this time, the patient does not meet this requirement.  He will return for two morning serum testosterones, two days apart before 9 AM If the testosterones levels return below 300 ng/dL we will need to do additional blood work [facility] - if that returns below normal or low normal we will need to add a prolactin) - if that blood work is abnormal - we will need to refer to endocrinology  2. Erectile dysfunction SHIM score is 24, it is stable Refill given for sildenafil  RTC in 12 months for repeat SHIM score and exam   3. BPH with LUTS IPSS score is 11/2, it is slightly improved  Continue conservative management, avoiding bladder irritants and timed voiding's  RTC in 12 months for IPSS, PSA and exam   Return for Return in the am for a testosterone level before 9 AM .  These notes generated with voice recognition software. I apologize for typographical errors.  Zara Council, PA-C  [facility] [address] [place], [place] [address] [phone]

## 2019-12-10 ENCOUNTER — Encounter: Payer: Self-pay | Admitting: Urology

## 2019-12-10 ENCOUNTER — Ambulatory Visit: Payer: Medicare HMO | Admitting: Urology

## 2019-12-10 ENCOUNTER — Other Ambulatory Visit: Payer: Self-pay

## 2019-12-10 VITALS — BP 144/85 | HR 85 | Temp 98.3°F | Ht 69.0 in | Wt 261.0 lb

## 2019-12-10 DIAGNOSIS — N401 Enlarged prostate with lower urinary tract symptoms: Secondary | ICD-10-CM | POA: Diagnosis not present

## 2019-12-10 DIAGNOSIS — N138 Other obstructive and reflux uropathy: Secondary | ICD-10-CM

## 2019-12-10 DIAGNOSIS — E349 Endocrine disorder, unspecified: Secondary | ICD-10-CM | POA: Diagnosis not present

## 2019-12-10 DIAGNOSIS — N529 Male erectile dysfunction, unspecified: Secondary | ICD-10-CM

## 2019-12-11 ENCOUNTER — Other Ambulatory Visit: Payer: Medicare HMO

## 2019-12-11 DIAGNOSIS — N529 Male erectile dysfunction, unspecified: Secondary | ICD-10-CM | POA: Diagnosis not present

## 2019-12-11 DIAGNOSIS — N401 Enlarged prostate with lower urinary tract symptoms: Secondary | ICD-10-CM | POA: Diagnosis not present

## 2019-12-11 DIAGNOSIS — E349 Endocrine disorder, unspecified: Secondary | ICD-10-CM

## 2019-12-11 DIAGNOSIS — N138 Other obstructive and reflux uropathy: Secondary | ICD-10-CM | POA: Diagnosis not present

## 2019-12-11 LAB — MICROSCOPIC EXAMINATION: Bacteria, UA: NONE SEEN

## 2019-12-11 LAB — URINALYSIS, COMPLETE
Bilirubin, UA: NEGATIVE
Glucose, UA: NEGATIVE
Ketones, UA: NEGATIVE
Leukocytes,UA: NEGATIVE
Nitrite, UA: NEGATIVE
Protein,UA: NEGATIVE
Specific Gravity, UA: >1.03 — ABNORMAL HIGH (ref 1.005–1.030)
Urobilinogen, Ur: 1 mg/dL (ref 0.2–1.0)
pH, UA: 6 (ref 5.0–7.5)

## 2019-12-12 ENCOUNTER — Other Ambulatory Visit: Payer: Self-pay | Admitting: Family Medicine

## 2019-12-12 ENCOUNTER — Telehealth: Payer: Self-pay | Admitting: Family Medicine

## 2019-12-12 DIAGNOSIS — E349 Endocrine disorder, unspecified: Secondary | ICD-10-CM

## 2019-12-12 LAB — TESTOSTERONE: Testosterone: 275 ng/dL (ref 264–916)

## 2019-12-12 NOTE — Telephone Encounter (Signed)
Patient notified and appointment made

## 2019-12-12 NOTE — Telephone Encounter (Signed)
-----   Message from Nori Riis, PA-C sent at 12/12/2019  8:09 AM EDT ----- Please let Mr. Pitstick know that his testosterone level is below 300 and we need a second testosterone level before 9 am next week.

## 2019-12-16 ENCOUNTER — Other Ambulatory Visit: Payer: Medicare HMO

## 2019-12-16 ENCOUNTER — Other Ambulatory Visit: Payer: Self-pay

## 2019-12-16 DIAGNOSIS — E349 Endocrine disorder, unspecified: Secondary | ICD-10-CM | POA: Diagnosis not present

## 2019-12-16 DIAGNOSIS — N401 Enlarged prostate with lower urinary tract symptoms: Secondary | ICD-10-CM | POA: Diagnosis not present

## 2019-12-17 ENCOUNTER — Telehealth: Payer: Self-pay | Admitting: Family Medicine

## 2019-12-17 LAB — TESTOSTERONE: Testosterone: 247 ng/dL — ABNORMAL LOW (ref 264–916)

## 2019-12-17 NOTE — Telephone Encounter (Signed)
[facility] called, lab has been added.

## 2019-12-17 NOTE — Progress Notes (Signed)
[facility] was not able to process the request because the previous Prior Authorization Request was Denied, please submit an electronic Appeal Request. You can also contact the plan at [phone] or fax in request to [phone].  Key: [id number] - Rx #: [id number]

## 2019-12-17 NOTE — Telephone Encounter (Signed)
-----   Message from Nori Riis, PA-C sent at 12/17/2019  8:39 AM EDT ----- Would you call [facility] and add a PSA to his testosterone results from yesterday?

## 2019-12-18 ENCOUNTER — Other Ambulatory Visit: Payer: Self-pay | Admitting: Urology

## 2019-12-18 ENCOUNTER — Telehealth: Payer: Self-pay | Admitting: Family Medicine

## 2019-12-18 LAB — PSA: Prostate Specific Ag, Serum: 0.1 ng/mL (ref 0.0–4.0)

## 2019-12-18 LAB — SPECIMEN STATUS REPORT

## 2019-12-18 NOTE — Telephone Encounter (Signed)
-----   Message from Nori Riis, PA-C sent at 12/18/2019  7:59 AM EDT ----- Please let Duane Hughes know that his two testosterone levels returned below 300 which meets criteria for testosterone deficiency.  His PSA is also within normal limits.  We can start testosterone therapy at this time.  The type of testosterone treatment is typically insurance driven.  Does he have access to his formulary?

## 2019-12-18 NOTE — Telephone Encounter (Signed)
Patient states he is going to call his insurance company to find out what testosterone therapy is on the formulary.

## 2019-12-18 NOTE — Telephone Encounter (Signed)
LMOM for patient to return call.

## 2019-12-18 NOTE — Telephone Encounter (Signed)
Patient left a message returning your call

## 2019-12-18 NOTE — Telephone Encounter (Signed)
Pt returned [place] call, advised results, pt does not have access to his formulary. Pt is asking what testosterone therapy are you thinking of?

## 2019-12-19 ENCOUNTER — Ambulatory Visit: Payer: Medicare HMO | Admitting: Family Medicine

## 2019-12-26 DIAGNOSIS — G4733 Obstructive sleep apnea (adult) (pediatric): Secondary | ICD-10-CM | POA: Diagnosis not present

## 2020-01-01 DIAGNOSIS — E119 Type 2 diabetes mellitus without complications: Secondary | ICD-10-CM | POA: Diagnosis not present

## 2020-01-01 DIAGNOSIS — H524 Presbyopia: Secondary | ICD-10-CM | POA: Diagnosis not present

## 2020-01-19 ENCOUNTER — Other Ambulatory Visit: Payer: Self-pay | Admitting: Neurosurgery

## 2020-01-20 ENCOUNTER — Other Ambulatory Visit: Payer: Self-pay

## 2020-01-20 ENCOUNTER — Telehealth: Payer: Self-pay | Admitting: Psychiatry

## 2020-01-20 ENCOUNTER — Other Ambulatory Visit: Payer: Self-pay | Admitting: Psychiatry

## 2020-01-20 DIAGNOSIS — F329 Major depressive disorder, single episode, unspecified: Secondary | ICD-10-CM

## 2020-01-20 DIAGNOSIS — F32A Depression, unspecified: Secondary | ICD-10-CM

## 2020-01-20 MED ORDER — BUPROPION HCL ER (SR) 150 MG PO TB12: ORAL_TABLET | ORAL | 0 refills | Status: DC

## 2020-01-20 NOTE — Telephone Encounter (Signed)
Rx sent 

## 2020-01-20 NOTE — Telephone Encounter (Signed)
review 

## 2020-01-20 NOTE — Telephone Encounter (Signed)
Pt called to get a refill on Bupropion. He has not been seen since 10/2018. He has an appt on 10/5 with Janett Billow. Call refill to [facility], [address] [place], [place]. [phone].

## 2020-01-20 NOTE — Telephone Encounter (Signed)
Okay to send 30 day?

## 2020-01-20 NOTE — Telephone Encounter (Signed)
Ok to send

## 2020-01-22 ENCOUNTER — Encounter
Admission: RE | Admit: 2020-01-22 | Discharge: 2020-01-22 | Disposition: A | Discharge status: Home / Self Care | Payer: Medicare HMO | Source: Ambulatory Visit | Attending: Neurosurgery | Admitting: Neurosurgery

## 2020-01-22 ENCOUNTER — Other Ambulatory Visit: Payer: Medicare HMO

## 2020-01-22 ENCOUNTER — Encounter: Payer: Self-pay | Admitting: Podiatry

## 2020-01-22 ENCOUNTER — Other Ambulatory Visit: Payer: Self-pay

## 2020-01-22 ENCOUNTER — Ambulatory Visit: Payer: Medicare HMO | Admitting: Podiatry

## 2020-01-22 DIAGNOSIS — Z01818 Encounter for other preprocedural examination: Secondary | ICD-10-CM | POA: Insufficient documentation

## 2020-01-22 DIAGNOSIS — E1169 Type 2 diabetes mellitus with other specified complication: Secondary | ICD-10-CM

## 2020-01-22 DIAGNOSIS — E669 Obesity, unspecified: Secondary | ICD-10-CM

## 2020-01-22 DIAGNOSIS — Z20822 Contact with and (suspected) exposure to covid-19: Secondary | ICD-10-CM | POA: Insufficient documentation

## 2020-01-22 DIAGNOSIS — B351 Tinea unguium: Secondary | ICD-10-CM

## 2020-01-22 DIAGNOSIS — I447 Left bundle-branch block, unspecified: Secondary | ICD-10-CM | POA: Diagnosis not present

## 2020-01-22 DIAGNOSIS — I10 Essential (primary) hypertension: Secondary | ICD-10-CM | POA: Diagnosis not present

## 2020-01-22 DIAGNOSIS — M79676 Pain in unspecified toe(s): Secondary | ICD-10-CM

## 2020-01-22 HISTORY — DX: Anemia, unspecified: D64.9

## 2020-01-22 LAB — BASIC METABOLIC PANEL
Anion gap: 10 (ref 5–15)
BUN: 17 mg/dL (ref 8–23)
CO2: 23 mmol/L (ref 22–32)
Calcium: 8.4 mg/dL — ABNORMAL LOW (ref 8.9–10.3)
Chloride: 104 mmol/L (ref 98–111)
Creatinine, Ser: 0.87 mg/dL (ref 0.61–1.24)
GFR calc Af Amer: >60 mL/min (ref >60)
GFR calc non Af Amer: >60 mL/min (ref >60)
Glucose, Bld: 120 mg/dL — ABNORMAL HIGH (ref 70–99)
Potassium: 4.3 mmol/L (ref 3.5–5.1)
Sodium: 137 mmol/L (ref 135–145)

## 2020-01-22 LAB — TYPE AND SCREEN
ABO/RH(D): O POS
Antibody Screen: NEGATIVE

## 2020-01-22 LAB — CBC
HCT: 47.9 % (ref 39.0–52.0)
Hemoglobin: 15.8 g/dL (ref 13.0–17.0)
MCH: 30.8 pg (ref 26.0–34.0)
MCHC: 33 g/dL (ref 30.0–36.0)
MCV: 93.4 fL (ref 80.0–100.0)
Platelets: 302 10*3/uL (ref 150–400)
RBC: 5.13 MIL/uL (ref 4.22–5.81)
RDW: 12.8 % (ref 11.5–15.5)
WBC: 8.6 10*3/uL (ref 4.0–10.5)
nRBC: 0 % (ref 0.0–0.2)

## 2020-01-22 LAB — URINALYSIS, ROUTINE W REFLEX MICROSCOPIC
Bilirubin Urine: NEGATIVE
Glucose, UA: NEGATIVE mg/dL
Hgb urine dipstick: NEGATIVE
Ketones, ur: NEGATIVE mg/dL
Leukocytes,Ua: NEGATIVE
Nitrite: NEGATIVE
Protein, ur: NEGATIVE mg/dL
Specific Gravity, Urine: 1.029 (ref 1.005–1.030)
pH: 5 (ref 5.0–8.0)

## 2020-01-22 LAB — SURGICAL PCR SCREEN
MRSA, PCR: NEGATIVE
Staphylococcus aureus: NEGATIVE

## 2020-01-22 LAB — PROTIME-INR
INR: 1 (ref 0.8–1.2)
Prothrombin Time: 12.4 seconds (ref 11.4–15.2)

## 2020-01-22 LAB — APTT: aPTT: 29 seconds (ref 24–36)

## 2020-01-22 NOTE — Progress Notes (Signed)
This patient returns to my office for at risk foot care.  This patient requires this care by a professional since this patient will be at risk due to having diabetes.  This patient is unable to cut nails himself since the patient cannot reach his nails.These nails are painful walking and wearing shoes.  This patient presents for at risk foot care today.  General Appearance  Alert, conversant and in no acute stress.  Vascular  Dorsalis pedis and posterior tibial  pulses are palpable  bilaterally.  Capillary return is within normal limits  bilaterally. Temperature is within normal limits  bilaterally.  Neurologic  Senn-Weinstein monofilament wire test within normal limits  bilaterally. Muscle power within normal limits bilaterally.  Nails Thick disfigured discolored nails with subungual debris  from hallux to fifth toes bilaterally. No evidence of bacterial infection or drainage bilaterally.  Orthopedic  No limitations of motion  feet .  No crepitus or effusions noted.  No bony pathology or digital deformities noted.  Skin  normotropic skin with no porokeratosis noted bilaterally.  No signs of infections or ulcers noted.     Onychomycosis  Pain in right toes  Pain in left toes  Consent was obtained for treatment procedures.   Mechanical debridement of nails 1-5  bilaterally performed with a nail nipper.  Filed with dremel without incident.    Return office visit  10 weeks                    Told patient to return for periodic foot care and evaluation due to potential at risk complications.   Katerina Zurn DPM   

## 2020-01-22 NOTE — Patient Instructions (Addendum)
Your procedure is scheduled on: Monday January 26, 2020. Report to Day Surgery inside [facility] 2nd floor. To find out your arrival time please call [phone] between 1PM - 3PM on Friday January 23, 2020.  Remember: Instructions that are not followed completely may result in serious medical risk,  up to and including death, or upon the discretion of your surgeon and anesthesiologist your  surgery may need to be rescheduled.     _X__ 1. Do not eat food after midnight the night before your procedure.                 No chewing gum or hard candies. You may drink clear liquids up to 2 hours                 before you are scheduled to arrive for your surgery- DO not drink clear                 liquids within 2 hours of the start of your surgery.                 Clear Liquids include:  water, apple juice without pulp, clear Gatorade, G2 or                  Gatorade Zero (avoid Red/Purple/Blue), Black Coffee or Tea (Do not add                 anything to coffee or tea).  __X__2.  On the morning of surgery brush your teeth with toothpaste and water, you                may rinse your mouth with mouthwash if you wish.  Do not swallow any toothpaste of mouthwash.     _X__ 3.  No Alcohol for 24 hours before or after surgery.   _X__ 4.  Do Not Smoke or use e-cigarettes For 24 Hours Prior to Your Surgery.                 Do not use any chewable tobacco products for at least 6 hours prior to                 Surgery.  _X__  5.  Do not use any recreational drugs (marijuana, cocaine, heroin, ecstasy, MDMA or other)                For at least one week prior to your surgery.  Combination of these drugs with anesthesia                May have life threatening results.  __x__ 6.  Notify your doctor if there is any change in your medical condition      (cold, fever, infections).     Do not wear jewelry, make-up, hairpins, clips or nail polish. Do not wear lotions,  powders, or perfumes. You may wear deodorant. Do not shave 48 hours prior to surgery. Men may shave face and neck. Do not bring valuables to the hospital.    [facility] is not responsible for any belongings or valuables.  Contacts, dentures or bridgework may not be worn into surgery. Leave your suitcase in the car. After surgery it may be brought to your room. For patients admitted to the hospital, discharge time is determined by your treatment team.   Patients discharged the day of surgery will not be allowed to drive home.   Make arrangements for someone to be  with you for the first 24 hours of your Same Day Discharge.    Please read over the following fact sheets that you were given:     Spine Surgery   __x__ Take these medicines the morning of surgery with A SIP OF WATER:    1. buPROPion (WELLBUTRIN SR) 150 MG  2. lamoTRIgine (LAMICTAL) 200 MG    ___x_ Use CHG Soap as directed  __x__ Stop aspirin as instructed by your provider   __x__ Stop Metformin 2 days prior to your surgery. Last dose will be Friday September 24.  __x__ Stop Anti-inflammatories such as Ibuprofen, Advil, Aleve, naproxen and or BC powders.   __x__ Stop supplements until after surgery.  Fish oil and Turmeric  __x__ Bring C-Pap to the hospital.    If you have any questions regarding your pre-procedure instructions,  Please call Pre-admit Testing at [phone].

## 2020-01-23 ENCOUNTER — Other Ambulatory Visit
Admission: RE | Admit: 2020-01-23 | Discharge: 2020-01-23 | Disposition: A | Discharge status: Home / Self Care | Payer: Medicare HMO | Source: Ambulatory Visit | Attending: Neurosurgery | Admitting: Neurosurgery

## 2020-01-23 DIAGNOSIS — Z20822 Contact with and (suspected) exposure to covid-19: Secondary | ICD-10-CM | POA: Diagnosis not present

## 2020-01-23 DIAGNOSIS — Z01818 Encounter for other preprocedural examination: Secondary | ICD-10-CM | POA: Diagnosis not present

## 2020-01-23 DIAGNOSIS — I447 Left bundle-branch block, unspecified: Secondary | ICD-10-CM | POA: Diagnosis not present

## 2020-01-23 DIAGNOSIS — M48061 Spinal stenosis, lumbar region without neurogenic claudication: Secondary | ICD-10-CM | POA: Diagnosis not present

## 2020-01-23 LAB — SARS CORONAVIRUS 2 (TAT 6-24 HRS): SARS Coronavirus 2: NEGATIVE

## 2020-01-26 ENCOUNTER — Ambulatory Visit: Payer: Medicare HMO | Admitting: Anesthesiology

## 2020-01-26 ENCOUNTER — Encounter
Admission: RE | Disposition: A | Discharge status: Home / Self Care | Payer: Self-pay | Source: Home / Self Care | Attending: Neurosurgery

## 2020-01-26 ENCOUNTER — Encounter: Payer: Self-pay | Admitting: Neurosurgery

## 2020-01-26 ENCOUNTER — Ambulatory Visit
Admission: RE | Admit: 2020-01-26 | Discharge: 2020-01-26 | Disposition: A | Discharge status: Home / Self Care | Payer: Medicare HMO | Attending: Neurosurgery | Admitting: Neurosurgery

## 2020-01-26 ENCOUNTER — Ambulatory Visit: Payer: Medicare HMO

## 2020-01-26 ENCOUNTER — Other Ambulatory Visit: Payer: Self-pay

## 2020-01-26 DIAGNOSIS — I10 Essential (primary) hypertension: Secondary | ICD-10-CM | POA: Insufficient documentation

## 2020-01-26 DIAGNOSIS — Z419 Encounter for procedure for purposes other than remedying health state, unspecified: Secondary | ICD-10-CM

## 2020-01-26 DIAGNOSIS — M48062 Spinal stenosis, lumbar region with neurogenic claudication: Secondary | ICD-10-CM | POA: Diagnosis not present

## 2020-01-26 DIAGNOSIS — G4730 Sleep apnea, unspecified: Secondary | ICD-10-CM | POA: Insufficient documentation

## 2020-01-26 DIAGNOSIS — E785 Hyperlipidemia, unspecified: Secondary | ICD-10-CM | POA: Insufficient documentation

## 2020-01-26 DIAGNOSIS — F329 Major depressive disorder, single episode, unspecified: Secondary | ICD-10-CM | POA: Diagnosis not present

## 2020-01-26 DIAGNOSIS — Z96653 Presence of artificial knee joint, bilateral: Secondary | ICD-10-CM | POA: Insufficient documentation

## 2020-01-26 DIAGNOSIS — E1142 Type 2 diabetes mellitus with diabetic polyneuropathy: Secondary | ICD-10-CM | POA: Insufficient documentation

## 2020-01-26 DIAGNOSIS — Z7982 Long term (current) use of aspirin: Secondary | ICD-10-CM | POA: Diagnosis not present

## 2020-01-26 DIAGNOSIS — Z79899 Other long term (current) drug therapy: Secondary | ICD-10-CM | POA: Diagnosis not present

## 2020-01-26 DIAGNOSIS — R69 Illness, unspecified: Secondary | ICD-10-CM | POA: Diagnosis not present

## 2020-01-26 DIAGNOSIS — M130 Polyarthritis, unspecified: Secondary | ICD-10-CM | POA: Insufficient documentation

## 2020-01-26 DIAGNOSIS — F419 Anxiety disorder, unspecified: Secondary | ICD-10-CM | POA: Diagnosis not present

## 2020-01-26 DIAGNOSIS — Z7984 Long term (current) use of oral hypoglycemic drugs: Secondary | ICD-10-CM | POA: Insufficient documentation

## 2020-01-26 DIAGNOSIS — Z85828 Personal history of other malignant neoplasm of skin: Secondary | ICD-10-CM | POA: Insufficient documentation

## 2020-01-26 DIAGNOSIS — G4733 Obstructive sleep apnea (adult) (pediatric): Secondary | ICD-10-CM | POA: Diagnosis not present

## 2020-01-26 DIAGNOSIS — Z981 Arthrodesis status: Secondary | ICD-10-CM | POA: Diagnosis not present

## 2020-01-26 HISTORY — PX: LUMBAR LAMINECTOMY/DECOMPRESSION MICRODISCECTOMY: SHX5026

## 2020-01-26 LAB — GLUCOSE, CAPILLARY
Glucose-Capillary: 130 mg/dL — ABNORMAL HIGH (ref 70–99)
Glucose-Capillary: 153 mg/dL — ABNORMAL HIGH (ref 70–99)

## 2020-01-26 SURGERY — LUMBAR LAMINECTOMY/DECOMPRESSION MICRODISCECTOMY 2 LEVELS
Anesthesia: General

## 2020-01-26 MED ORDER — LIDOCAINE HCL (CARDIAC) PF 100 MG/5ML IV SOSY
PREFILLED_SYRINGE | INTRAVENOUS | Status: DC | PRN
  Administered 2020-01-26: 100 mg via INTRAVENOUS

## 2020-01-26 MED ORDER — ROCURONIUM BROMIDE 100 MG/10ML IV SOLN
INTRAVENOUS | Status: DC | PRN
  Administered 2020-01-26: 40 mg via INTRAVENOUS
  Administered 2020-01-26: 10 mg via INTRAVENOUS

## 2020-01-26 MED ORDER — MIDAZOLAM HCL 2 MG/2ML IJ SOLN
INTRAMUSCULAR | Status: DC | PRN
  Administered 2020-01-26: 2 mg via INTRAVENOUS

## 2020-01-26 MED ORDER — ACETAMINOPHEN 10 MG/ML IV SOLN
INTRAVENOUS | Status: AC
  Filled 2020-01-26: qty 100

## 2020-01-26 MED ORDER — ACETAMINOPHEN 10 MG/ML IV SOLN
INTRAVENOUS | Status: DC | PRN
  Administered 2020-01-26: 1000 mg via INTRAVENOUS

## 2020-01-26 MED ORDER — METHYLPREDNISOLONE ACETATE 40 MG/ML IJ SUSP
INTRAMUSCULAR | Status: DC | PRN
  Administered 2020-01-26: 40 mg

## 2020-01-26 MED ORDER — FENTANYL CITRATE (PF) 100 MCG/2ML IJ SOLN
25.0000 ug | INTRAMUSCULAR | Status: DC | PRN
  Administered 2020-01-26 (×3): 25 ug via INTRAVENOUS

## 2020-01-26 MED ORDER — ONDANSETRON HCL 4 MG/2ML IJ SOLN
INTRAMUSCULAR | Status: DC | PRN
  Administered 2020-01-26: 4 mg via INTRAVENOUS

## 2020-01-26 MED ORDER — CHLORHEXIDINE GLUCONATE 0.12 % MT SOLN
OROMUCOSAL | Status: AC
  Administered 2020-01-26: 15 mL via OROMUCOSAL
  Filled 2020-01-26: qty 15

## 2020-01-26 MED ORDER — FENTANYL CITRATE (PF) 100 MCG/2ML IJ SOLN
INTRAMUSCULAR | Status: DC | PRN
Start: 2020-01-26 — End: 2020-01-26
  Administered 2020-01-26 (×2): 50 ug via INTRAVENOUS

## 2020-01-26 MED ORDER — SUGAMMADEX SODIUM 200 MG/2ML IV SOLN
INTRAVENOUS | Status: DC | PRN
  Administered 2020-01-26: 200 mg via INTRAVENOUS

## 2020-01-26 MED ORDER — ONDANSETRON HCL 4 MG/2ML IJ SOLN: 4.0000 mg | Freq: Once | INTRAMUSCULAR | Status: DC | PRN

## 2020-01-26 MED ORDER — FENTANYL CITRATE (PF) 100 MCG/2ML IJ SOLN
INTRAMUSCULAR | Status: AC
  Administered 2020-01-26: 25 ug via INTRAVENOUS
  Filled 2020-01-26: qty 2

## 2020-01-26 MED ORDER — FAMOTIDINE 20 MG PO TABS: 20.0000 mg | ORAL_TABLET | Freq: Once | ORAL | Status: AC

## 2020-01-26 MED ORDER — OXYCODONE HCL 5 MG PO TABS: 5.0000 mg | ORAL_TABLET | ORAL | 0 refills | Status: DC | PRN

## 2020-01-26 MED ORDER — REMIFENTANIL HCL 1 MG IV SOLR
INTRAVENOUS | Status: DC | PRN
Start: 2020-01-26 — End: 2020-01-26
  Administered 2020-01-26: .1 ug/kg/min via INTRAVENOUS

## 2020-01-26 MED ORDER — LACTATED RINGERS IV SOLN: INTRAVENOUS | Status: DC | PRN

## 2020-01-26 MED ORDER — REMIFENTANIL HCL 1 MG IV SOLR
INTRAVENOUS | Status: AC
Start: 2020-01-26 — End: ?
  Filled 2020-01-26: qty 1000

## 2020-01-26 MED ORDER — DEXAMETHASONE SODIUM PHOSPHATE 10 MG/ML IJ SOLN
INTRAMUSCULAR | Status: DC | PRN
  Administered 2020-01-26: 10 mg via INTRAVENOUS

## 2020-01-26 MED ORDER — METHYLPREDNISOLONE ACETATE 40 MG/ML IJ SUSP
INTRAMUSCULAR | Status: AC
  Filled 2020-01-26: qty 1

## 2020-01-26 MED ORDER — FENTANYL CITRATE (PF) 100 MCG/2ML IJ SOLN
INTRAMUSCULAR | Status: AC
  Filled 2020-01-26: qty 2

## 2020-01-26 MED ORDER — ACETAMINOPHEN 500 MG PO TABS: 1000.0000 mg | ORAL_TABLET | Freq: Four times a day (QID) | ORAL | 0 refills | Status: DC | PRN

## 2020-01-26 MED ORDER — SODIUM CHLORIDE 0.9 % IV SOLN: INTRAVENOUS | Status: DC

## 2020-01-26 MED ORDER — GELATIN ABSORBABLE 12-7 MM EX MISC
CUTANEOUS | Status: AC
  Filled 2020-01-26: qty 1

## 2020-01-26 MED ORDER — PROPOFOL 10 MG/ML IV BOLUS
INTRAVENOUS | Status: DC | PRN
  Administered 2020-01-26: 200 mg via INTRAVENOUS

## 2020-01-26 MED ORDER — REMIFENTANIL HCL 1 MG IV SOLR
INTRAVENOUS | Status: AC
  Filled 2020-01-26: qty 1000

## 2020-01-26 MED ORDER — FAMOTIDINE 20 MG PO TABS
ORAL_TABLET | ORAL | Status: AC
  Administered 2020-01-26: 20 mg via ORAL
  Filled 2020-01-26: qty 1

## 2020-01-26 MED ORDER — BUPIVACAINE-EPINEPHRINE (PF) 0.5% -1:200000 IJ SOLN
INTRAMUSCULAR | Status: AC
  Filled 2020-01-26: qty 30

## 2020-01-26 MED ORDER — METHOCARBAMOL 500 MG PO TABS: 500.0000 mg | ORAL_TABLET | Freq: Four times a day (QID) | ORAL | 0 refills | Status: DC

## 2020-01-26 MED ORDER — BUPIVACAINE-EPINEPHRINE (PF) 0.5% -1:200000 IJ SOLN
INTRAMUSCULAR | Status: DC | PRN
  Administered 2020-01-26: 10 mL

## 2020-01-26 MED ORDER — THROMBIN 5000 UNITS EX SOLR
CUTANEOUS | Status: DC | PRN
  Administered 2020-01-26: 5000 [IU] via TOPICAL

## 2020-01-26 MED ORDER — CEFAZOLIN SODIUM-DEXTROSE 2-4 GM/100ML-% IV SOLN
INTRAVENOUS | Status: AC
  Filled 2020-01-26: qty 100

## 2020-01-26 MED ORDER — DEXMEDETOMIDINE (PRECEDEX) IN NS 20 MCG/5ML (4 MCG/ML) IV SYRINGE
PREFILLED_SYRINGE | INTRAVENOUS | Status: DC | PRN
  Administered 2020-01-26 (×3): 8 ug via INTRAVENOUS

## 2020-01-26 MED ORDER — CHLORHEXIDINE GLUCONATE 0.12 % MT SOLN: 15.0000 mL | Freq: Once | OROMUCOSAL | Status: AC

## 2020-01-26 MED ORDER — CEFAZOLIN SODIUM-DEXTROSE 2-4 GM/100ML-% IV SOLN: 2.0000 g | Freq: Once | INTRAVENOUS | Status: DC

## 2020-01-26 MED ORDER — SODIUM CHLORIDE 0.9 % IV SOLN
INTRAVENOUS | Status: DC | PRN
  Administered 2020-01-26: 30 ug/min via INTRAVENOUS

## 2020-01-26 MED ORDER — SUCCINYLCHOLINE CHLORIDE 20 MG/ML IJ SOLN
INTRAMUSCULAR | Status: DC | PRN
  Administered 2020-01-26: 100 mg via INTRAVENOUS

## 2020-01-26 MED ORDER — ORAL CARE MOUTH RINSE: 15.0000 mL | Freq: Once | OROMUCOSAL | Status: AC

## 2020-01-26 MED ORDER — MIDAZOLAM HCL 2 MG/2ML IJ SOLN
INTRAMUSCULAR | Status: AC
  Filled 2020-01-26: qty 2

## 2020-01-26 SURGICAL SUPPLY — 65 items
ADH SKN CLS APL DERMABOND .7 (GAUZE/BANDAGES/DRESSINGS) ×1
AGENT HMST MTR 8 SURGIFLO (HEMOSTASIS) ×1
APL PRP STRL LF DISP 70% ISPRP (MISCELLANEOUS) ×1
APL SRG 60D 8 XTD TIP BNDBL (TIP)
BLADE BOVIE TIP EXT 4 (BLADE) ×2 IMPLANT
BUR NEURO DRILL SOFT 3.0X3.8M (BURR) ×2 IMPLANT
CANISTER SUCT 1200ML W/VALVE (MISCELLANEOUS) ×2 IMPLANT
CHLORAPREP W/TINT 26 (MISCELLANEOUS) ×2 IMPLANT
CNTNR SPEC 2.5X3XGRAD LEK (MISCELLANEOUS) ×1
CONT SPEC 4OZ STER OR WHT (MISCELLANEOUS) ×1
CONT SPEC 4OZ STRL OR WHT (MISCELLANEOUS) ×1
CONTAINER SPEC 2.5X3XGRAD LEK (MISCELLANEOUS) ×1 IMPLANT
COUNTER NEEDLE 20/40 LG (NEEDLE) ×2 IMPLANT
COVER LIGHT HANDLE STERIS (MISCELLANEOUS) ×4 IMPLANT
COVER WAND RF STERILE (DRAPES) ×2 IMPLANT
DERMABOND ADVANCED (GAUZE/BANDAGES/DRESSINGS) ×1
DERMABOND ADVANCED .7 DNX12 (GAUZE/BANDAGES/DRESSINGS) ×1 IMPLANT
DRAPE C-ARM 42X70 (DRAPES) ×4 IMPLANT
DRAPE C-ARM 42X72 X-RAY (DRAPES) ×4 IMPLANT
DRAPE LAPAROTOMY 100X77 ABD (DRAPES) ×2 IMPLANT
DRAPE MICROSCOPE SPINE 48X150 (DRAPES) IMPLANT
DRAPE SURG 17X11 SM STRL (DRAPES) ×2 IMPLANT
DRSG TEGADERM 6X8 (GAUZE/BANDAGES/DRESSINGS) ×2 IMPLANT
DRSG TELFA 3X8 NADH (GAUZE/BANDAGES/DRESSINGS) ×2 IMPLANT
DURASEAL APPLICATOR TIP (TIP) IMPLANT
DURASEAL SPINE SEALANT 3ML (MISCELLANEOUS) IMPLANT
ELECT CAUTERY BLADE TIP 2.5 (TIP) ×2
ELECT EZSTD 165MM 6.5IN (MISCELLANEOUS) ×2
ELECT REM PT RETURN 9FT ADLT (ELECTROSURGICAL) ×2
ELECTRODE CAUTERY BLDE TIP 2.5 (TIP) ×1 IMPLANT
ELECTRODE EZSTD 165MM 6.5IN (MISCELLANEOUS) ×1 IMPLANT
ELECTRODE REM PT RTRN 9FT ADLT (ELECTROSURGICAL) ×1 IMPLANT
GAUZE 4X4 16PLY RFD (DISPOSABLE) ×2 IMPLANT
GAUZE SPONGE 4X4 12PLY STRL (GAUZE/BANDAGES/DRESSINGS) ×2 IMPLANT
GLOVE BIO SURGEON STRL SZ 6.5 (GLOVE) ×2 IMPLANT
GLOVE BIOGEL PI IND STRL 7.0 (GLOVE) ×2 IMPLANT
GLOVE BIOGEL PI IND STRL 8 (GLOVE) ×1 IMPLANT
GLOVE BIOGEL PI INDICATOR 7.0 (GLOVE) ×2
GLOVE BIOGEL PI INDICATOR 8 (GLOVE) ×1
GLOVE INDICATOR 7.0 STRL GRN (GLOVE) ×2 IMPLANT
GLOVE INDICATOR 8.0 STRL GRN (GLOVE) ×2 IMPLANT
GLOVE SURG SYN 7.0 (GLOVE) ×8 IMPLANT
GLOVE SURG SYN 8.0 (GLOVE) ×4 IMPLANT
GOWN STRL REUS W/ TWL XL LVL3 (GOWN DISPOSABLE) ×2 IMPLANT
GOWN STRL REUS W/TWL XL LVL3 (GOWN DISPOSABLE) ×4
GRADUATE 1200CC STRL 31836 (MISCELLANEOUS) ×2 IMPLANT
KIT TURNOVER KIT A (KITS) ×2 IMPLANT
KIT WILSON FRAME (KITS) ×2 IMPLANT
MARKER SKIN DUAL TIP RULER LAB (MISCELLANEOUS) ×4 IMPLANT
NDL SAFETY ECLIPSE 18X1.5 (NEEDLE) ×1 IMPLANT
NEEDLE HYPO 18GX1.5 SHARP (NEEDLE) ×2
NEEDLE HYPO 22GX1.5 SAFETY (NEEDLE) ×2 IMPLANT
NS IRRIG 1000ML POUR BTL (IV SOLUTION) ×2 IMPLANT
PACK LAMINECTOMY NEURO (CUSTOM PROCEDURE TRAY) ×2 IMPLANT
PAD ARMBOARD 7.5X6 YLW CONV (MISCELLANEOUS) ×2 IMPLANT
SPOGE SURGIFLO 8M (HEMOSTASIS) ×1
SPONGE SURGIFLO 8M (HEMOSTASIS) ×1 IMPLANT
SUT NURALON 4 0 TR CR/8 (SUTURE) IMPLANT
SUT POLYSORB 2-0 5X18 GS-10 (SUTURE) ×6 IMPLANT
SUT VIC AB 0 CT1 18XCR BRD 8 (SUTURE) ×1 IMPLANT
SUT VIC AB 0 CT1 8-18 (SUTURE) ×2
SYR 20ML LL LF (SYRINGE) ×2 IMPLANT
TOWEL OR 17X26 4PK STRL BLUE (TOWEL DISPOSABLE) ×6 IMPLANT
TRAY FOLEY MTR SLVR 16FR STAT (SET/KITS/TRAYS/PACK) IMPLANT
TUBING CONNECTING 10 (TUBING) ×2 IMPLANT

## 2020-01-26 NOTE — Progress Notes (Signed)
Procedure: L3 and L4 laminectomies Procedure date: 01/26/2020 Diagnosis: lumbar stenosis with neurogenic claudication    History: Duane Hughes is s/p L3 and L4 laminectomies POD0: Tolerated procedure well. Evaluated in post op recovery still disoriented from anesthesia but able to answer questions and obey commands.   Physical Exam: Vitals:   01/26/20 1537 01/26/20 1607  BP: 134/80 128/74  Pulse: 69 66  Resp: 17 18  Temp:  (!) 96.9 F (36.1 C)  SpO2: 92% 95%    General: Alert and oriented, lying in bed Strength:5/5 throughout  Sensation: intact and symmetric throughout  Skin: dressing over incision C/D/I  Data:  Recent Labs  Lab 01/22/20 1148  NA 137  K 4.3  CL 104  CO2 23  BUN 17  CREATININE 0.87  GLUCOSE 120*  CALCIUM 8.4*   No results for input(s): AST, ALT, ALKPHOS in the last 168 hours.  Invalid input(s): TBILI   Recent Labs  Lab 01/22/20 1148  WBC 8.6  HGB 15.8  HCT 47.9  PLT 302   Recent Labs  Lab 01/22/20 1148  APTT 29  INR 1.0         Assessment/Plan:  Duane Hughes is POD0 s/p L3 and L4 laminectomies.   Once he is able to urinate, ambulate, and tolerate PO, he is cleared for discharge to home.  We will follow up with him in two weeks.  Lonell Face, NP Department of Neurosurgery

## 2020-01-26 NOTE — Transfer of Care (Addendum)
Immediate Anesthesia Transfer of Care Note  Patient: Duane Hughes  Procedure(s) Performed: L3, L4 LAMINECTOMIES (N/A )  Patient Location: PACU  Anesthesia Type:General  Level of Consciousness: awake  Airway & Oxygen Therapy: Patient Spontanous Breathing  Post-op Assessment: Report given to RN  Post vital signs: Reviewed  Last Vitals:  Vitals Value Taken Time  BP 148/79 01/26/20 1420  Temp    Pulse 73 01/26/20 1424  Resp 17 01/26/20 1424  SpO2 100 % 01/26/20 1424  Vitals shown include unvalidated device data.  Last Pain:  Vitals:   01/26/20 0835  TempSrc: Oral  PainSc: 0-No pain         Complications: No complications documented.

## 2020-01-26 NOTE — Progress Notes (Signed)
Pharmacy Note - Cefazolin for surgical ppx   Wt 118.4 kg on 12/10/19  Cefazolin 2 g IV x1 pre op - order signed and held (would not let me sign through)  Rayna Sexton, PharmD, BCPS Clinical Pharmacist 01/26/2020 7:19 AM

## 2020-01-26 NOTE — Anesthesia Procedure Notes (Signed)
Procedure Name: Intubation Performed by: Timoteo Expose, CRNA Pre-anesthesia Checklist: Patient identified, Emergency Drugs available, Suction available and Patient being monitored Patient Re-evaluated:Patient Re-evaluated prior to induction Oxygen Delivery Method: Circle system utilized Preoxygenation: Pre-oxygenation with 100% oxygen Induction Type: IV induction Ventilation: Mask ventilation without difficulty Laryngoscope Size: McGraph and 4 Tube type: Oral Tube size: 7.5 mm Number of attempts: 1 Airway Equipment and Method: Stylet and Oral airway Placement Confirmation: ETT inserted through vocal cords under direct vision,  positive ETCO2 and breath sounds checked- equal and bilateral Tube secured with: Tape Dental Injury: Teeth and Oropharynx as per pre-operative assessment

## 2020-01-26 NOTE — Anesthesia Preprocedure Evaluation (Signed)
Anesthesia Evaluation  Patient identified by MRN, date of birth, ID band Patient awake    Reviewed: Allergy & Precautions, H&P , NPO status , Patient's Chart, lab work & pertinent test results, reviewed documented beta blocker date and time   History of Anesthesia Complications Negative for: history of anesthetic complications  Airway Mallampati: III  TM Distance: >3 FB Neck ROM: full    Dental  (+) Dental Advidsory Given, Caps, Teeth Intact   Pulmonary neg shortness of breath, sleep apnea and Continuous Positive Airway Pressure Ventilation , neg COPD, neg recent URI,    Pulmonary exam normal breath sounds clear to auscultation       Cardiovascular Exercise Tolerance: Good hypertension, (-) angina(-) Past MI and (-) Cardiac Stents Normal cardiovascular exam(-) dysrhythmias (-) Valvular Problems/Murmurs Rhythm:regular Rate:Normal     Neuro/Psych neg Seizures PSYCHIATRIC DISORDERS Anxiety Depression  Neuromuscular disease    GI/Hepatic Neg liver ROS, hiatal hernia, neg GERD  ,  Endo/Other  diabetes  Renal/GU negative Renal ROS  negative genitourinary   Musculoskeletal   Abdominal   Peds  Hematology negative hematology ROS (+)   Anesthesia Other Findings Past Medical History: No date: Anemia No date: Anxiety     Comment:  Dr.Jessica Eulas Post . well controlled No date: Arthritis     Comment:  back, hands, knees 05/09/2016: Colon polyp     Comment:  TUBULAR ADENOMAS  No date: Depression No date: Diabetes mellitus without complication ([place])     Comment:  type 2 04/18/2016: Ectatic abdominal aorta ([facility])     Comment:  2.9 cm on [place] 2016, unchanged 2017 No date: History of hiatal hernia     Comment:  20 years ago No date: History of skin cancer     Comment:  hand No date: Hyperlipidemia 2015: Hypertension     Comment:  Dr. Sanda Klein 11/11/2014: Morbid obesity ([place]) No date: Neuropathy 11/11/2014: Obesity No date: Sleep  apnea     Comment:  cpap 04/11/2018: Squamous cell carcinoma of skin     Comment:  SCC In Situ R dorsum wrist 06/23/2015: Squamous cell carcinoma of skin     Comment:  R dorsum hand   Reproductive/Obstetrics negative OB ROS                             Anesthesia Physical Anesthesia Plan  ASA: III  Anesthesia Plan: General   Post-op Pain Management:    Induction: Intravenous  PONV Risk Score and Plan: 2 and Ondansetron, Dexamethasone and Treatment may vary due to age or medical condition  Airway Management Planned: Oral ETT  Additional Equipment:   Intra-op Plan:   Post-operative Plan: Extubation in OR  Informed Consent: I have reviewed the patients History and Physical, chart, labs and discussed the procedure including the risks, benefits and alternatives for the proposed anesthesia with the patient or authorized representative who has indicated his/her understanding and acceptance.     Dental Advisory Given  Plan Discussed with: Anesthesiologist, CRNA and Surgeon  Anesthesia Plan Comments:         Anesthesia Quick Evaluation

## 2020-01-26 NOTE — Discharge Instructions (Signed)
PLEASE HOLD YOUR ASPIRIN FOR 7 DAYS POSTOPERATIVELY  Your surgeon has performed an operation on your lumbar spine (low back) to relieve pressure on one or more nerves. Many times, patients feel better immediately after surgery and can "overdo it." Even if you feel well, it is important that you follow these activity guidelines. If you do not let your back heal properly from the surgery, you can increase the chance of a disc herniation and/or return of your symptoms. The following are instructions to help in your recovery once you have been discharged from the hospital.  * Do not take anti-inflammatory medications for 3 days after surgery (naproxen [Aleve], ibuprofen [Advil, Motrin], celecoxib [Celebrex], etc.)  Activity    No bending, lifting, or twisting ("BLT"). Avoid lifting objects heavier than 10 pounds (gallon milk jug).  Where possible, avoid household activities that involve lifting, bending, pushing, or pulling such as laundry, vacuuming, grocery shopping, and childcare. Try to arrange for help from friends and family for these activities while your back heals.  Increase physical activity slowly as tolerated.  Taking short walks is encouraged, but avoid strenuous exercise. Do not jog, run, bicycle, lift weights, or participate in any other exercises unless specifically allowed by your doctor. Avoid prolonged sitting, including car rides.  Talk to your doctor before resuming sexual activity.  You should not drive until cleared by your doctor.  Until released by your doctor, you should not return to work or school.  You should rest at home and let your body heal.   You may shower two days after your surgery.  After showering, lightly dab your incision dry. Do not take a tub bath or go swimming for 3 weeks, or until approved by your doctor at your follow-up appointment.  If you smoke, we strongly recommend that you quit.  Smoking has been proven to interfere with normal healing in your back  and will dramatically reduce the success rate of your surgery. Please contact [facility] (800-QUIT-NOW) and use the resources at www.[facility].com for assistance in stopping smoking.  Surgical Incision   If you have a dressing on your incision, you may remove it three days after your surgery. Keep your incision area clean and dry.  If you have staples or stitches on your incision, you should have a follow up scheduled for removal. If you do not have staples or stitches, you will have steri-strips (small pieces of surgical tape) or Dermabond glue. The steri-strips/glue should begin to peel away within about a week (it is fine if the steri-strips fall off before then). If the strips are still in place one week after your surgery, you may gently remove them.  Diet            You may return to your usual diet. Be sure to stay hydrated.  When to Contact us  Although your surgery and recovery will likely be uneventful, you may have some residual numbness, aches, and pains in your back and/or legs. This is normal and should improve in the next few weeks.  However, should you experience any of the following, contact us immediately: . New numbness or weakness . Pain that is progressively getting worse, and is not relieved by your pain medications or rest . Bleeding, redness, swelling, pain, or drainage from surgical incision . Chills or flu-like symptoms . Fever greater than 101.0 F (38.3 C) . Problems with bowel or bladder functions . Difficulty breathing or shortness of breath . Warmth, tenderness, or swelling in your  calf  Contact Information . During office hours (Monday-Friday 9 am to 5 pm), please call your physician at [phone] . After hours and weekends, please call [phone] and an answering service will put you in touch with either Dr. Lacinda Axon or Dr. Izora Ribas.  . For a life-threatening emergency, call 911

## 2020-01-26 NOTE — Interval H&P Note (Signed)
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  The various methods of treatment have been discussed with the patient and family. After consideration of risks, benefits and other options for treatment, the patient has consented to  Procedure(s): L3, L4 LAMINECTOMIES (N/A) as a surgical intervention.  The patient's history has been reviewed, patient examined, no change in status, stable for surgery.  I have reviewed the patient's chart and labs.  Questions were answered to the patient's satisfaction.     Deetta Perla

## 2020-01-26 NOTE — H&P (Signed)
Duane Hughes is an 69 y.o. male.   Chief Complaint: Gait difficulty HPI: Duane Hughes is here for evaluation of ongoing leg fatigue. He states he does have a history of peripheral neuropathy. This does cause numbness in bilateral lower extremities below the knees. However, he does feel like he is having more difficulty with ambulation and going up steps with increasing fatigue that does appear to be relieved by rest. He notes the symptoms come on pretty frequently when not walking and does not have to travel a very long distance for it to occur. He does not endorse any sharp pain going down his legs. He additionally has been dealing with some intermittent numbness in his upper extremities but this is not as severe. He does not endorse any fine motor weakness. He does have some difficulty with balance. He did undergo physical therapy after knee replacement in 2020 but has not had any recent therapy for his lumbar spine. He does not report any recent injections. He did have a MRI of the lumbar spine which showed stenosis from L2/3 to L4/5 and we recommended decompressive laminectomies. He wishes to proceed.  Past Medical History:  Diagnosis Date  . Anemia   . Anxiety    Dr.Jessica Eulas Post . well controlled  . Arthritis    back, hands, knees  . Colon polyp 05/09/2016   TUBULAR ADENOMAS   . Depression   . Diabetes mellitus without complication ([place])    type 2  . Ectatic abdominal aorta ([place]) 04/18/2016   2.9 cm on [place] 2016, unchanged 2017  . History of hiatal hernia    20 years ago  . History of skin cancer    hand  . Hyperlipidemia   . Hypertension 2015   Dr. Sanda Klein  . Morbid obesity ([place]) 11/11/2014  . Neuropathy   . Obesity 11/11/2014  . Sleep apnea    cpap  . Squamous cell carcinoma of skin 04/11/2018   SCC In Situ R dorsum wrist  . Squamous cell carcinoma of skin 06/23/2015   R dorsum hand    Past Surgical History:  Procedure Laterality Date  . COLONOSCOPY WITH PROPOFOL N/A 05/09/2016    Procedure: COLONOSCOPY WITH PROPOFOL;  Surgeon: Lucilla Lame, MD;  Location: [facility] ENDOSCOPY;  Service: Endoscopy;  Laterality: N/A;  . HERNIA REPAIR     Hiatal  . JOINT REPLACEMENT     04-04-17 Dr. Gladstone Lighter   Right knee  . KNEE ARTHROSCOPY     several scopes done  . KNEE CLOSED REDUCTION Right 05/31/2017   Procedure: CLOSED MANIPULATION RIGHT KNEE;  Surgeon: Latanya Maudlin, MD;  Location: [facility];  Service: Orthopedics;  Laterality: Right;  Femoral Block  . MEDIAL PARTIAL KNEE REPLACEMENT Left    2000s?  Marland Kitchen SKIN CANCER EXCISION  Feb 2017   removed from right hand  . 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 TOTAL KNEE ARTHROPLASTY Left 09/24/2018   Procedure: REVISION LEFT KNEE ARTHROPLASTY;  Surgeon: Paralee Cancel, MD;  Location: [facility];  Service: Orthopedics;  Laterality: Left;  70 mins    Family History  Problem Relation Age of Onset  . Alcohol abuse Mother   . Rheum arthritis Mother   . Stroke Maternal Grandmother   . Stroke Paternal Grandfather   . Dementia Paternal Grandmother   . Cancer Neg Hx   . Diabetes Neg Hx   .  Heart disease Neg Hx   . Hypertension Neg Hx   . Kidney disease Neg Hx   . Prostate cancer Neg Hx    Social History:  reports that he has never smoked. He has never used smokeless tobacco. He reports that he does not drink alcohol and does not use drugs.  Allergies: No Known Allergies  Medications Prior to Admission  Medication Sig Dispense Refill  . atorvastatin (LIPITOR) 10 MG tablet Take 1 tablet (10 mg total) by mouth at bedtime. 90 tablet 3  . buPROPion (WELLBUTRIN SR) 150 MG 12 hr tablet TAKE 1 TABLET (150 MG TOTAL) BY MOUTH 2 (TWO) TIMES DAILY. (Patient taking differently: Take 150 mg by mouth 2 (two) times daily. ) 60 tablet 0  . Cholecalciferol (VITAMIN D) 50 MCG (2000 UT) tablet Take 2,000 Units by mouth daily.     . ferrous sulfate 325 (65 FE) MG EC tablet Take 1 tablet (325 mg total) by mouth 3 (three) times a week. (Patient taking differently: Take 325 mg by mouth 3 days. ) 36 tablet 3  . lamoTRIgine (LAMICTAL) 200 MG tablet TAKE 1 TABLET (200 MG TOTAL) BY MOUTH DAILY. 90 tablet 1  . lisinopril (ZESTRIL) 5 MG tablet Take 0.5 tablets (2.5 mg total) by mouth daily. 45 tablet 3  . metFORMIN (GLUCOPHAGE) 500 MG tablet Take 2 tablets with breakfast and 1 tablet with dinner daily. (Patient taking differently: Take 500-1,000 mg by mouth See admin instructions. Take 1000 mg in the morning and 500 mg in the afternoon) 270 tablet 3  . Omega-3 Fatty Acids (FISH OIL) 1000 MG CAPS Take 1,000 mg by mouth daily.     . pramipexole (MIRAPEX) 0.5 MG tablet TAKE 1 TABLET (0.5 MG TOTAL) BY MOUTH 2 (TWO) TIMES A DAY. (Patient taking differently: Take 0.5 mg by mouth 2 (two) times daily. ) 180 tablet 1  . Turmeric 500 MG TABS Take 500 mg by mouth daily.     Marland Kitchen aspirin EC 81 MG tablet Take 81 mg by mouth daily. (Patient not taking: Reported on 01/20/2020)    . Baclofen 5 MG TABS Take 5 mg by mouth 2 (two) times daily as needed. (Patient not taking: Reported on 01/20/2020) 90 tablet 3  . hydrocortisone 2.5 % cream Apply 1 application topically daily as needed (rash). (Patient not taking: Reported on 01/22/2020)    . ketoconazole (NIZORAL) 2 % cream Apply 1 application topically daily as needed for irritation.  (Patient not taking: Reported on 01/22/2020)    . omega-3 acid ethyl esters (LOVAZA) 1 g capsule Take by mouth 2 (two) times daily. (Patient not taking: Reported on 01/20/2020)    . oxyCODONE-acetaminophen (PERCOCET/ROXICET) 5-325 MG tablet Take 1 tablet by mouth daily as needed for severe pain. Do not take with Tylenol as this tablet already contains tylenol (Patient not taking: Reported on 01/20/2020) 30 tablet 0  . pregabalin (LYRICA) 25 MG capsule Take 1 capsule (25 mg total) by mouth 2 (two) times daily. (Patient not taking:  Reported on 01/20/2020) 60 capsule 3  . sildenafil (REVATIO) 20 MG tablet Take 3 to 5 tablets two hours before intercouse on an empty stomach.  Do not take with nitrates. (Patient not taking: Reported on 01/20/2020) 50 tablet 3  . Vitamin D, Ergocalciferol, (DRISDOL) 1.25 MG (50000 UNIT) CAPS capsule Take 1 capsule (50,000 Units total) by mouth every 7 (seven) days. x12 weeks. (Patient not taking: Reported on 01/20/2020) 12 capsule 1    No results found for this  or any previous visit (from the past 48 hour(s)). No results found.  Review of Systems General ROS: Negative Psychological ROS: Negative Ophthalmic ROS: Negative ENT ROS: Negative Hematological and Lymphatic ROS: Negative  Endocrine ROS: Negative Respiratory ROS: Negative Cardiovascular ROS: Negative Gastrointestinal ROS: Negative Genito-Urinary ROS: Negative Musculoskeletal ROS: Positive for back pain Neurological ROS: Positive for leg fatigue, numbness Dermatological ROS: Negative  Blood pressure (!) 150/90, pulse 81, temperature 98.3 F (36.8 C), temperature source Oral, resp. rate 18, weight 115.7 kg, SpO2 95 %. Physical Exam  General appearance: Alert, cooperative, in no acute distress Head: Normocephalic, atraumatic Eyes: Normal, EOM intact Oropharynx: Wearing facemask CV: Regular rate and rhythm Pulm: Clear to auscultation Ext: No edema in LE bilaterally  Neurologic exam:  Mental status: alertness: alert, affect: normal Speech: fluent and clear Motor:strength symmetric 5/5 in bilateral lower extremities including hip flexion, knee flexion, knee extension, dorsiflex, plantar flexion Sensory: Decreased light touch in bilateral lower extremities below the knee circumferentially Reflexes: Absent reflex at patella, absent at bilateral bicep. Negative Hoffmann's Gait: Slightly shortened gait length  Imaging: MRI lumbar spine: There is a lordotic curvature. There appears to be severe degenerative disease noted  throughout the lumbar spine. This is most prominent from L2-L5. There is hypertrophied ligamentum flavum at multiple levels which does cause severe stenosis at L2-3, L3-4, and L4-5. There is milder stenosis at L1-2.   Assessment/Plan -L3-4 laminectomy for symptomatic lumbar stenosis   Deetta Perla, MD 01/26/2020, 9:15 AM

## 2020-01-26 NOTE — Op Note (Signed)
Operative Note  SURGERY DATE:01/26/2020  PRE-OP DIAGNOSIS:Lumbar Stenosis withNeurogenic Claudication  POST-OP DIAGNOSIS:Post-Op Diagnosis Codes: Lumbar Stenosis withNeurogenic Claudication  Procedure(s) with comments: L3 and L4 Laminectomies   SURGEON:  * Malen Gauze, MD    Lonell Face, PA, Assistant  ANESTHESIA:General  OPERATIVE FINDINGS:Stenosis at L2/3, L3/4, L4/5  OPERATIVE REPORT:   Indication: Mr. Doylrpresented to the clinic on7/6with ongoing pain and numbness with walking.MRIrevealed stenosis at L2/3, L3/4 and L4/5.He had been through prescription pain medication and therapywithout improvement.Lumbardecompressionwas discussed to relieve symptoms.Therisks of surgery were explained to include hematoma, infection, damage to nerve roots, CSF leak, weakness, numbness, pain, need for future surgery including fusion, heart attack, and stroke.Heelected to proceed with surgery for symptom relief.   Procedure The patient was brought to the OR after informed consent was obtained.He was given general anesthesia and intubated by the anesthesia service. Vascular access lines were placed.The patient was then placed prone on a Wilson frameensuring all pressure points were padded. A time-out was performed per protocol.   The patient was sterilely prepped and draped.Amidlineincision wasfound using fluoroscopy andmarked andinstilled withlocal anesthetic with epinephrine. The skin was opened sharply and the dissection taken to the fascia. This was incised and cautery was used to dissect the subperiosteal plane to expose the spinous processes and lamina of L2-L5. Retractors were inserted and hemostasis was achieved. X-ray was used to confirm location.  Next, a matchstickdrill bit was used to remove thecaudal L2lamina, entire L3 and L4 lamina,and superiorL5lamina to expose the ligament.We removed the ligament until normal dura  seen.The dura was seen to be expanding. There was severe overgrown facets bilaterally and medial facetectomies were done at multiple levels. Next, attention was turned to lateral recess stenosis and the ligament was removed here. The dura was then seen to be free from L2/3 to L4/5. This was again confirmed with fluoroscopy.   Once the dura appeared free from all the bone edges, hemostasis was obtained with Floseal and cautery.The wound was irrigated profusely.Thefasciaand musclewas then closed using 0 vicryl.Next, multiple subcutaneous and dermal layers were closed with 2-0 vicryl until the epidermis was well approximated. The skin was closed withstaples.  The patient was returned to supine position and extubated by the anesthesia service. The patient was then taken to the PACU for post-operative care wherehe was moving extremities symmetrically.   ESTIMATED BLOOD LOSS: 200cc  SPECIMENS None  IMPLANT None   I performed the case in its entiretywith the assistance ofChristine Zdeb, PA,  Deetta Perla, [place]

## 2020-01-27 ENCOUNTER — Encounter: Payer: Self-pay | Admitting: Neurosurgery

## 2020-02-01 NOTE — Anesthesia Postprocedure Evaluation (Signed)
Anesthesia Post Note  Patient: Duane Hughes  Procedure(s) Performed: L3, L4 LAMINECTOMIES (N/A )  Patient location during evaluation: PACU Anesthesia Type: General Level of consciousness: awake and alert Pain management: pain level controlled Vital Signs Assessment: post-procedure vital signs reviewed and stable Respiratory status: spontaneous breathing, nonlabored ventilation, respiratory function stable and patient connected to nasal cannula oxygen Cardiovascular status: blood pressure returned to baseline and stable Postop Assessment: no apparent nausea or vomiting Anesthetic complications: no   No complications documented.   Last Vitals:  Vitals:   01/26/20 1537 01/26/20 1607  BP: 134/80 128/74  Pulse: 69 66  Resp: 17 18  Temp:  (!) 36.1 C  SpO2: 92% 95%    Last Pain:  Vitals:   01/27/20 1008  TempSrc:   PainSc: 3                  Molli Barrows

## 2020-02-03 ENCOUNTER — Other Ambulatory Visit: Payer: Self-pay

## 2020-02-03 ENCOUNTER — Encounter: Payer: Self-pay | Admitting: Psychiatry

## 2020-02-03 ENCOUNTER — Ambulatory Visit (INDEPENDENT_AMBULATORY_CARE_PROVIDER_SITE_OTHER): Payer: Medicare HMO | Admitting: Psychiatry

## 2020-02-03 DIAGNOSIS — F32A Depression, unspecified: Secondary | ICD-10-CM

## 2020-02-03 DIAGNOSIS — R69 Illness, unspecified: Secondary | ICD-10-CM | POA: Diagnosis not present

## 2020-02-03 DIAGNOSIS — F329 Major depressive disorder, single episode, unspecified: Secondary | ICD-10-CM | POA: Diagnosis not present

## 2020-02-03 MED ORDER — PRAMIPEXOLE DIHYDROCHLORIDE 0.5 MG PO TABS: 0.5000 mg | ORAL_TABLET | Freq: Two times a day (BID) | ORAL | 3 refills | Status: DC

## 2020-02-03 MED ORDER — BUPROPION HCL ER (SR) 150 MG PO TB12: ORAL_TABLET | ORAL | 3 refills | Status: DC

## 2020-02-03 MED ORDER — LAMOTRIGINE 200 MG PO TABS: 200.0000 mg | ORAL_TABLET | Freq: Every day | ORAL | 3 refills | Status: DC

## 2020-02-03 NOTE — Progress Notes (Signed)
Duane Hughes [id number] [birth year] 69 y.o.  Subjective:   Patient ID:  Duane Hughes is a 69 y.o. (DOB [birth year]) male.  Chief Complaint:  Chief Complaint  Patient presents with  . Follow-up    h/o depression    HPI Duane Hughes presents to the office today for follow-up of mood disturbance. Denies depressed mood. He reports only brief periods lasting less than a day where he has mild depression and reports that this is infrequent. He reports that he has had some anger issues. He describes being irritated by some things more easily and has been able to redirect these thoughts. He reports that he has had some frustration with limited mobility. Denies anxiety. Sleep was disrupted due to pain in the past. Sleep has improved and sleeping about 6-7 hours and sleep has been less interrupted. Appetite has been good. Energy has been low due to decrease in physical stamina. Now trying to increase stamina and mobility. Motivation has been good. Concentration has been adequate. Denies SI.   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   He and his wife have been planning some trips in the future.   Past Psychiatric Medication Trials: Pristiq-ineffective Effexor-effective for 2 years then no longer effective Bupropion-taken for years Cymbalta-helped for 2 years but no longer effective Abilify-not effective at 5 mg and increased emotional lability at 10 mg Lamictal Lithium Pramipexole-effective for mood signs and symptoms  GAD-7     Office Visit from 11/04/2019 in [facility]  Total GAD-7 Score 0    PHQ2-9     Office Visit from 11/04/2019 in [facility] Office Visit from 09/18/2019 in [facility] Office Visit from 09/04/2019 in [place] from 05/20/2019 in [facility] Office Visit from 03/31/2019 in [facility]  PHQ-2 Total  Score 0 0 0 1 0  PHQ-9 Total Score 0 0 0 3 0       Review of Systems:  Review of Systems  Musculoskeletal:       Had back surgery a little over a week ago. Has also had a total knee replacement.   Skin: Negative for rash.  Neurological: Negative for tremors.       Has had neuropathy that interferes with his balance. Reports pain has improved since back surgery.   Psychiatric/Behavioral:       Please refer to HPI    Medications: I have reviewed the patient's current medications.  Current Outpatient Medications  Medication Sig Dispense Refill  . atorvastatin (LIPITOR) 10 MG tablet Take 1 tablet (10 mg total) by mouth at bedtime. 90 tablet 3  . Baclofen 5 MG TABS Take 5 mg by mouth 2 (two) times daily as needed. 90 tablet 3  . buPROPion (WELLBUTRIN SR) 150 MG 12 hr tablet TAKE 1 TABLET (150 MG TOTAL) BY MOUTH 2 (TWO) TIMES DAILY. 180 tablet 3  . Cholecalciferol (VITAMIN D) 50 MCG (2000 UT) tablet Take 2,000 Units by mouth daily.    . ferrous sulfate 325 (65 FE) MG EC tablet Take 1 tablet (325 mg total) by mouth 3 (three) times a week. (Patient taking differently: Take 325 mg by mouth 3 days. ) 36 tablet 3  . lisinopril (ZESTRIL) 5 MG tablet Take 0.5 tablets (2.5 mg total) by mouth daily. 45 tablet 3  . metFORMIN (GLUCOPHAGE) 500 MG tablet Take 2 tablets with breakfast and 1 tablet with  dinner daily. (Patient taking differently: Take 500-1,000 mg by mouth See admin instructions. Take 1000 mg in the morning and 500 mg in the afternoon) 270 tablet 3  . naproxen (NAPROSYN) 250 MG tablet Take by mouth 2 (two) times daily with a meal.    . Omega-3 Fatty Acids (FISH OIL) 1000 MG CAPS Take 1,000 mg by mouth daily.     . Turmeric 500 MG TABS Take 500 mg by mouth daily.     Marland Kitchen acetaminophen (TYLENOL) 500 MG tablet Take 2 tablets (1,000 mg total) by mouth every 6 (six) hours as needed for mild pain. (Patient not taking: Reported on 02/03/2020) 30 tablet 0  . hydrocortisone 2.5 % cream Apply 1  application topically daily as needed (rash). (Patient not taking: Reported on 01/22/2020)    . ketoconazole (NIZORAL) 2 % cream Apply 1 application topically daily as needed for irritation.  (Patient not taking: Reported on 01/22/2020)    . lamoTRIgine (LAMICTAL) 200 MG tablet Take 1 tablet (200 mg total) by mouth daily. 90 tablet 3  . oxyCODONE (ROXICODONE) 5 MG immediate release tablet Take 1 tablet (5 mg total) by mouth every 4 (four) hours as needed for severe pain. (Patient not taking: Reported on 02/03/2020) 30 tablet 0  . pramipexole (MIRAPEX) 0.5 MG tablet Take 1 tablet (0.5 mg total) by mouth 2 (two) times daily. 180 tablet 3  . pregabalin (LYRICA) 25 MG capsule Take 1 capsule (25 mg total) by mouth 2 (two) times daily. (Patient not taking: Reported on 01/20/2020) 60 capsule 3  . sildenafil (REVATIO) 20 MG tablet Take 3 to 5 tablets two hours before intercouse on an empty stomach.  Do not take with nitrates. (Patient not taking: Reported on 01/20/2020) 50 tablet 3  . Vitamin D, Ergocalciferol, (DRISDOL) 1.25 MG (50000 UNIT) CAPS capsule Take 1 capsule (50,000 Units total) by mouth every 7 (seven) days. x12 weeks. (Patient not taking: Reported on 01/20/2020) 12 capsule 1   No current facility-administered medications for this visit.    Medication Side Effects: None  Allergies: No Known Allergies  Past Medical History:  Diagnosis Date  . Anemia   . Anxiety    Dr.Dahiana Kulak [facility] . well controlled  . Arthritis    back, hands, knees  . Colon polyp 05/09/2016   TUBULAR ADENOMAS   . Depression   . Diabetes mellitus without complication ([place])    type 2  . Ectatic abdominal aorta ([place]) 04/18/2016   2.9 cm on [place] 2016, unchanged 2017  . History of hiatal hernia    20 years ago  . History of skin cancer    hand  . Hyperlipidemia   . Hypertension 2015   Dr. Sanda Klein  . Morbid obesity ([place]) 11/11/2014  . Neuropathy   . Obesity 11/11/2014  . Sleep apnea    cpap  . Squamous cell carcinoma of  skin 04/11/2018   SCC In Situ R dorsum wrist  . Squamous cell carcinoma of skin 06/23/2015   R dorsum hand    Family History  Problem Relation Age of Onset  . Alcohol abuse Mother   . Rheum arthritis Mother   . Stroke Maternal Grandmother   . Stroke Paternal Grandfather   . Dementia Paternal Grandmother   . Cancer Neg Hx   . Diabetes Neg Hx   . Heart disease Neg Hx   . Hypertension Neg Hx   . Kidney disease Neg Hx   . Prostate cancer Neg Hx     Social  History   Socioeconomic History  . Marital status: Married    Spouse name: michele Jagodzinski  . Number of children: 6  . Years of education: Not on file  . Highest education level: Associate degree: academic program  Occupational History    Comment: part time  Tobacco Use  . Smoking status: Never Smoker  . Smokeless tobacco: Never Used  Vaping Use  . Vaping Use: Never used  Substance and Sexual Activity  . Alcohol use: No    Alcohol/week: 0.0 standard drinks  . Drug use: No  . Sexual activity: Yes    Partners: Female  Other Topics Concern  . 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 Difficulty of Paying Living Expenses: Not hard at all  Food Insecurity: No Food Insecurity  . Worried About Charity fundraiser in the Last Year: Never true  . Ran Out of Food in the Last Year: Never true  Transportation Needs: No Transportation Needs  . Lack of Transportation (Medical): No  . Lack of Transportation (Non-Medical): No  Physical Activity: Sufficiently Active  . Days of Exercise per Week: 7 days  . Minutes of Exercise per Session: 60 min  Stress: No Stress Concern Present  . Feeling of Stress : Only a little  Social Connections: Socially Integrated  . Frequency of Communication with Friends and Family: More than three times a week  . Frequency of Social Gatherings with Friends and Family: Three  times a week  . Attends Religious Services: More than 4 times per year  . Active Member of Clubs or Organizations: Yes  . Attends Archivist Meetings: 1 to 4 times per year  . Marital Status: Married  Human resources officer Violence: Not At Risk  . Fear of Current or Ex-Partner: No  . Emotionally Abused: No  . Physically Abused: No  . Sexually Abused: No    Past Medical History, Surgical history, Social history, and Family history were reviewed and updated as appropriate.   Please see review of systems for further details on the patient's review from today.   Objective:   Physical Exam:  There were no vitals taken for this visit.  Physical Exam Constitutional:      General: He is not in acute distress. Musculoskeletal:        General: No deformity.  Neurological:     Mental Status: He is alert and oriented to person, place, and time.     Coordination: Coordination normal.  Psychiatric:        Attention and Perception: Attention and perception normal. He does not perceive auditory or visual hallucinations.        Mood and Affect: Mood normal. Mood is not anxious or depressed. Affect is not labile, blunt, angry or inappropriate.        Speech: Speech normal.        Behavior: Behavior normal.        Thought Content: Thought content normal. Thought content is not paranoid or delusional. Thought content does not include homicidal or suicidal ideation. Thought content does not include homicidal or suicidal plan.        Cognition and Memory: Cognition and memory normal.        Judgment: Judgment normal.     Comments: Insight intact     Lab Review:     Component  Value Date/Time   NA 137 01/22/2020 1148   NA 139 05/12/2015 1537   K 4.3 01/22/2020 1148   CL 104 01/22/2020 1148   CO2 23 01/22/2020 1148   GLUCOSE 120 (H) 01/22/2020 1148   BUN 17 01/22/2020 1148   BUN 15 05/12/2015 1537   CREATININE 0.87 01/22/2020 1148   CREATININE 0.98 11/04/2019 1526   CALCIUM 8.4 (L)  01/22/2020 1148   PROT 5.6 (L) 11/04/2019 1526   PROT 6.2 03/19/2015 0825   ALBUMIN 4.1 09/19/2016 1400   ALBUMIN 4.0 03/19/2015 0825   AST 30 11/04/2019 1526   ALT 40 11/04/2019 1526   ALKPHOS 46 09/19/2016 1400   BILITOT 0.4 11/04/2019 1526   BILITOT 0.5 03/19/2015 0825   GFRNONAA >60 01/22/2020 1148   GFRNONAA 78 11/04/2019 1526   GFRAA >60 01/22/2020 1148   GFRAA 91 11/04/2019 1526       Component Value Date/Time   WBC 8.6 01/22/2020 1148   RBC 5.13 01/22/2020 1148   HGB 15.8 01/22/2020 1148   HGB 14.8 06/20/2016 1458   HCT 47.9 01/22/2020 1148   HCT 43.6 06/20/2016 1458   PLT 302 01/22/2020 1148   PLT 260 06/20/2016 1458   MCV 93.4 01/22/2020 1148   MCV 93 06/20/2016 1458   MCH 30.8 01/22/2020 1148   MCHC 33.0 01/22/2020 1148   RDW 12.8 01/22/2020 1148   RDW 13.3 06/20/2016 1458   LYMPHSABS 1,220 11/04/2019 1526   LYMPHSABS 2.0 06/20/2016 1458   MONOABS 0.4 03/29/2017 1343   EOSABS 251 11/04/2019 1526   EOSABS 0.4 06/20/2016 1458   BASOSABS 40 11/04/2019 1526   BASOSABS 0.1 06/20/2016 1458    No results found for: POCLITH, LITHIUM   No results found for: PHENYTOIN, PHENOBARB, VALPROATE, CBMZ   .res Assessment: Plan:   Will continue current plan of care since target signs and symptoms are well controlled without any tolerability issues. Continue Wellbutrin SR 150 mg po BID for depression. Continue Lamictal 200 mg qd for mood. Continue Pramipexole 0.5 mg BID for depression. Pt to follow-up in 1 year or sooner if clinically indicated. Patient advised to contact office with any questions, adverse effects, or acute worsening in signs and symptoms.  Duane Hughes was seen today for follow-up.  Diagnoses and all orders for this visit:  Chronic depressive disorder Comments: Chronic, stable Orders: -     buPROPion (WELLBUTRIN SR) 150 MG 12 hr tablet; TAKE 1 TABLET (150 MG TOTAL) BY MOUTH 2 (TWO) TIMES DAILY. -     lamoTRIgine (LAMICTAL) 200 MG tablet; Take 1  tablet (200 mg total) by mouth daily. -     pramipexole (MIRAPEX) 0.5 MG tablet; Take 1 tablet (0.5 mg total) by mouth 2 (two) times daily.     Please see After Visit Summary for patient specific instructions.  Future Appointments  Date Time Provider [place]  03/02/2020  3:30 PM [facility] None  03/09/2020  3:30 PM McGowan, Larene Beach A, PA-C [facility] None  04/01/2020  1:30 PM Gardiner Barefoot, DPM [facility]  05/20/2020  2:50 PM [place] ADVISOR [facility]  09/08/2020  3:00 PM Ralene Bathe, MD [facility] None  10/21/2020  9:30 AM [facility] VASC 2 [facility] None  10/21/2020 10:30 AM Schnier, Dolores Lory, MD [facility] None  02/03/2021  3:30 PM Thayer Headings, PMHNP CP-CP None    No orders of the defined types were placed in this encounter.   -------------------------------

## 2020-02-18 ENCOUNTER — Ambulatory Visit: Payer: Self-pay | Admitting: *Deleted

## 2020-02-18 NOTE — Chronic Care Management (AMB) (Signed)
CCM status changed to previously enrolled.  Shahad Mazurek, LCSW Clinical Social Worker  [facility] Care Management [phone]  

## 2020-02-19 ENCOUNTER — Ambulatory Visit: Payer: Self-pay | Admitting: *Deleted

## 2020-02-19 NOTE — Progress Notes (Signed)
This encounter was created in error - please disregard.

## 2020-02-19 NOTE — Addendum Note (Signed)
Addended by: Vern Claude on: 02/19/2020 09:45 PM   Modules accepted: Level of Service, SmartSet

## 2020-02-19 NOTE — Chronic Care Management (AMB) (Signed)
CCM enrollment status changed to previously enrolled.   Marcio Hoque, LCSW Clinical Social Worker  [facility] Care Management [phone]  

## 2020-03-02 ENCOUNTER — Other Ambulatory Visit: Payer: Self-pay

## 2020-03-02 ENCOUNTER — Other Ambulatory Visit: Payer: Medicare HMO

## 2020-03-02 DIAGNOSIS — N138 Other obstructive and reflux uropathy: Secondary | ICD-10-CM

## 2020-03-02 DIAGNOSIS — N401 Enlarged prostate with lower urinary tract symptoms: Secondary | ICD-10-CM

## 2020-03-03 LAB — PSA: Prostate Specific Ag, Serum: 0.2 ng/mL (ref 0.0–4.0)

## 2020-03-08 NOTE — Progress Notes (Signed)
3:56 PM   Duane Hughes 06/16/1950 [id number]  Referring provider: Arnetha Courser, MD No address on file  Chief Complaint  Patient presents with  . Benign Prostatic Hypertrophy    HPI: Patient is a 69 year old male with ED and BPH with LU TS who presents today to discuss testosterone treatment.    Patient was seen on 11/27/2019 for various MSK complaints and fatigue.  He has sleep apnea and sleeps with a CPAP.  A mid morning testosterone was obtained and it was noted to be 228.9.  Erectile dysfunction His SHIM score is 24,  which is no erectile dysfunction.   His previous SHIM score was 25.  His libido is preserved.  His risk factors for ED are age, BPH, DM, HTN, HLD, sleep apnea, depression, antidepressants, pain medication and blood pressure medications. He denies any painful erections or curvatures with his erections.   He has tried PDE5-inhibitors in the past with success and continues them now.     SHIM    Row Name 03/09/20 1534         SHIM: Over the last 6 months:   How do you rate your confidence that you could get and keep an erection? High     When you had erections with sexual stimulation, how often were your erections hard enough for penetration (entering your partner)? Almost Always or Always     During sexual intercourse, how often were you able to maintain your erection after you had penetrated (entered) your partner? Almost Always or Always     During sexual intercourse, how difficult was it to maintain your erection to completion of intercourse? Not Difficult     When you attempted sexual intercourse, how often was it satisfactory for you? Almost Always or Always       SHIM Total Score   SHIM 24            Score: 1-7 Severe ED 8-11 Moderate ED 12-16 Mild-Moderate ED 17-21 Mild ED 22-25 No ED  BPH WITH LUTS His IPSS score today is 7, which is mild lower urinary tract symptomatology.  He is mostly satisfied with his quality life due to his  urinary symptoms.  His main complaint is urinary frequency.  His previous IPSS score was 13/2.  His previous PVR is 0 mL.     IPSS    Row Name 03/09/20 1500         International Prostate Symptom Score   How often have you had the sensation of not emptying your bladder? Less than half the time     How often have you had to urinate less than every two hours? Less than 1 in 5 times     How often have you found you stopped and started again several times when you urinated? Less than 1 in 5 times     How often have you found it difficult to postpone urination? Not at All     How often have you had a weak urinary stream? Less than half the time     How often have you had to strain to start urination? Not at All     How many times did you typically get up at night to urinate? 1 Time     Total IPSS Score 7       Quality of Life due to urinary symptoms   If you were to spend the rest of your life with your urinary condition just the  way it is now how would you feel about that? Mostly Satisfied            Score:  1-7 Mild 8-19 Moderate 20-35 Severe   PMH: Past Medical History:  Diagnosis Date  . Anemia   . Anxiety    Dr.Jessica Eulas Post . well controlled  . Arthritis    back, hands, knees  . Colon polyp 05/09/2016   TUBULAR ADENOMAS   . Depression   . Diabetes mellitus without complication ([place])    type 2  . Ectatic abdominal aorta ([place]) 04/18/2016   2.9 cm on [place] 2016, unchanged 2017  . History of hiatal hernia    20 years ago  . History of skin cancer    hand  . Hyperlipidemia   . Hypertension 2015   Dr. Sanda Klein  . Morbid obesity ([place]) 11/11/2014  . Neuropathy   . Obesity 11/11/2014  . Sleep apnea    cpap  . Squamous cell carcinoma of skin 04/11/2018   SCC In Situ R dorsum wrist  . Squamous cell carcinoma of skin 06/23/2015   R dorsum hand    Surgical History: Past Surgical History:  Procedure Laterality Date  . COLONOSCOPY WITH PROPOFOL N/A 05/09/2016   Procedure:  COLONOSCOPY WITH PROPOFOL;  Surgeon: Lucilla Lame, MD;  Location: [facility];  Service: Endoscopy;  Laterality: N/A;  . HERNIA REPAIR     Hiatal  . JOINT REPLACEMENT     04-04-17 Dr. Gladstone Lighter   Right knee  . KNEE ARTHROSCOPY     several scopes done  . KNEE CLOSED REDUCTION Right 05/31/2017   Procedure: CLOSED MANIPULATION RIGHT KNEE;  Surgeon: Latanya Maudlin, MD;  Location: [facility];  Service: Orthopedics;  Laterality: Right;  Femoral Block  . LUMBAR LAMINECTOMY/DECOMPRESSION MICRODISCECTOMY N/A 01/26/2020   Procedure: L3, L4 LAMINECTOMIES;  Surgeon: Deetta Perla, MD;  Location: [facility];  Service: Neurosurgery;  Laterality: N/A;  . MEDIAL PARTIAL KNEE REPLACEMENT Left    2000s?  Marland Kitchen SKIN CANCER EXCISION  Feb 2017   removed from right hand  . 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 TOTAL KNEE ARTHROPLASTY Left 09/24/2018   Procedure: REVISION LEFT KNEE ARTHROPLASTY;  Surgeon: Paralee Cancel, MD;  Location: [facility];  Service: Orthopedics;  Laterality: Left;  70 mins    Home Medications:  Allergies as of 03/09/2020   No Known Allergies     Medication List       Accurate as of March 09, 2020  3:56 PM. If you have any questions, ask your nurse or doctor.        STOP taking these medications   hydrocortisone 2.5 % cream Stopped by: Raymundo Rout, PA-C   ketoconazole 2 % cream Commonly known as: NIZORAL Stopped by: Josephene Marrone, PA-C   oxyCODONE 5 MG immediate release tablet Commonly known as: Roxicodone Stopped by: Zara Council, PA-C     TAKE these medications   acetaminophen 500 MG tablet Commonly known as: TYLENOL Take 2 tablets (1,000 mg total) by mouth every 6 (six) hours as needed for mild pain.   atorvastatin 10 MG tablet Commonly known as: LIPITOR Take 1 tablet (10 mg total) by mouth at bedtime.   Baclofen 5 MG  Tabs Take 5 mg by mouth 2 (two) times daily as needed.   buPROPion 150 MG 12 hr tablet Commonly known as: WELLBUTRIN SR TAKE 1  TABLET (150 MG TOTAL) BY MOUTH 2 (TWO) TIMES DAILY.   ferrous sulfate 325 (65 FE) MG EC tablet Take 1 tablet (325 mg total) by mouth 3 (three) times a week. What changed: when to take this   Fish Oil 1000 MG Caps Take 1,000 mg by mouth daily.   lamoTRIgine 200 MG tablet Commonly known as: LAMICTAL Take 1 tablet (200 mg total) by mouth daily.   lisinopril 5 MG tablet Commonly known as: ZESTRIL Take 0.5 tablets (2.5 mg total) by mouth daily.   metFORMIN 500 MG tablet Commonly known as: GLUCOPHAGE Take 2 tablets with breakfast and 1 tablet with dinner daily. What changed:   how much to take  how to take this  when to take this  additional instructions   naproxen 250 MG tablet Commonly known as: NAPROSYN Take by mouth 2 (two) times daily with a meal.   pramipexole 0.5 MG tablet Commonly known as: MIRAPEX Take 1 tablet (0.5 mg total) by mouth 2 (two) times daily.   pregabalin 25 MG capsule Commonly known as: LYRICA Take 1 capsule (25 mg total) by mouth 2 (two) times daily.   sildenafil 20 MG tablet Commonly known as: REVATIO Take 3 to 5 tablets two hours before intercouse on an empty stomach.  Do not take with nitrates.   Turmeric 500 MG Tabs Take 500 mg by mouth daily.   Vitamin D (Ergocalciferol) 1.25 MG (50000 UNIT) Caps capsule Commonly known as: DRISDOL Take 1 capsule (50,000 Units total) by mouth every 7 (seven) days. x12 weeks.   Vitamin D 50 MCG (2000 UT) tablet Take 2,000 Units by mouth daily.       Allergies: No Known Allergies  Family History: Family History  Problem Relation Age of Onset  . Alcohol abuse Mother   . Rheum arthritis Mother   . Stroke Maternal Grandmother   . Stroke Paternal Grandfather   . Dementia Paternal Grandmother   . Cancer Neg Hx   . Diabetes Neg Hx   . Heart disease Neg Hx   .  Hypertension Neg Hx   . Kidney disease Neg Hx   . Prostate cancer Neg Hx     Social History:  reports that he has never smoked. He has never used smokeless tobacco. He reports that he does not drink alcohol and does not use drugs.  ROS: For pertinent review of systems please refer to history of present illness  Physical Exam: BP (!) 141/87   Pulse 83   Ht 5\' 9"  (1.753 m)   Wt 264 lb (119.7 kg)   BMI 38.99 kg/m   Constitutional:  Well nourished. Alert and oriented, No acute distress. HEENT: [place] AT, mask in place.  Trachea midline Cardiovascular: No clubbing, cyanosis, or edema. Respiratory: Normal respiratory effort, no increased work of breathing. GU: No CVA tenderness.  No bladder fullness or masses.  Patient with circumcised phallus.  Urethral meatus is patent.  No penile discharge. No penile lesions or rashes. Scrotum without lesions, cysts, rashes and/or edema.  Testicles are located scrotally bilaterally. No masses are appreciated in the testicles. Left and right epididymis are normal.  Right epididymal cyst palpated Rectal: Patient with  normal sphincter tone. Anus and perineum without scarring or rashes. No rectal masses are appreciated. Prostate is approximately 50 grams, could only palpated the apex and midportion of the gland, no nodules are appreciated. Seminal vesicles could not be palpated Neurologic: Grossly intact, no focal deficits, moving all 4 extremities. Psychiatric: Normal mood and  affect.  Laboratory Data: Urinalysis Component     Latest Ref Rng & Units 12/10/2019  Specific Gravity, UA     1.005 - 1.030 >1.030 (H)  pH, UA     5.0 - 7.5 6.0  Color, UA     Yellow Yellow  Appearance Ur     Clear Clear  Leukocytes,UA     Negative Negative  Protein,UA     Negative/Trace Negative  Glucose, UA     Negative Negative  Ketones, UA     Negative Negative  RBC, UA     Negative Trace (A)  Bilirubin, UA     Negative Negative  Urobilinogen, Ur     0.2 - 1.0  mg/dL 1.0  Nitrite, UA     Negative Negative  Microscopic Examination      See below:   Component     Latest Ref Rng & Units 12/10/2019  WBC, UA     0 - 5 /hpf 0-5  RBC     0 - 2 /hpf 0-2  Epithelial Cells (non renal)     0 - 10 /hpf 0-10  Bacteria, UA     None seen/Few None seen   Lab Results  Component Value Date   WBC 8.6 01/22/2020   HGB 15.8 01/22/2020   HCT 47.9 01/22/2020   MCV 93.4 01/22/2020   PLT 302 01/22/2020    Lab Results  Component Value Date   CREATININE 0.87 01/22/2020    PSA History  Component     Latest Ref Rng & Units 03/19/2015 02/28/2016 03/01/2017 02/25/2018  Prostate Specific Ag, Serum     0.0 - 4.0 ng/mL 0.8 1.0 0.2 0.2   Component     Latest Ref Rng & Units 03/03/2019 12/16/2019 03/02/2020  Prostate Specific Ag, Serum     0.0 - 4.0 ng/mL 0.2 0.1 0.2    Lab Results  Component Value Date   HGBA1C 6.3 (H) 09/04/2019    Lab Results  Component Value Date   TSH 3.13 09/04/2019    Lab Results  Component Value Date   AST 30 11/04/2019   Lab Results  Component Value Date   ALT 40 11/04/2019   I have reviewed the labs.  Assessment & Plan:    1. Erectile dysfunction SHIM score is 24, it is stable Continue sildenafil RTC in 12 months for repeat SHIM score and exam   2. BPH with LUTS IPSS score is 7/2, it is slightly improved  Continue conservative management, avoiding bladder irritants and timed voiding's  RTC in 12 months for IPSS, PSA and exam   Return in about 1 year (around 03/09/2021) for IPSS, SHIM, PSA and exam.  These notes generated with voice recognition software. I apologize for typographical errors.  Zara Council, PA-C  [facility] [address] [place], [place] [address] [phone]

## 2020-03-09 ENCOUNTER — Ambulatory Visit (INDEPENDENT_AMBULATORY_CARE_PROVIDER_SITE_OTHER): Payer: Medicare HMO | Admitting: Urology

## 2020-03-09 ENCOUNTER — Encounter: Payer: Self-pay | Admitting: Urology

## 2020-03-09 ENCOUNTER — Other Ambulatory Visit: Payer: Self-pay

## 2020-03-09 VITALS — BP 141/87 | HR 83 | Ht 69.0 in | Wt 264.0 lb

## 2020-03-09 DIAGNOSIS — N138 Other obstructive and reflux uropathy: Secondary | ICD-10-CM

## 2020-03-09 DIAGNOSIS — E349 Endocrine disorder, unspecified: Secondary | ICD-10-CM | POA: Diagnosis not present

## 2020-03-09 DIAGNOSIS — N529 Male erectile dysfunction, unspecified: Secondary | ICD-10-CM

## 2020-03-09 DIAGNOSIS — N401 Enlarged prostate with lower urinary tract symptoms: Secondary | ICD-10-CM | POA: Diagnosis not present

## 2020-04-01 ENCOUNTER — Other Ambulatory Visit: Payer: Self-pay

## 2020-04-01 ENCOUNTER — Encounter: Payer: Self-pay | Admitting: Podiatry

## 2020-04-01 ENCOUNTER — Ambulatory Visit: Payer: Medicare HMO | Admitting: Podiatry

## 2020-04-01 DIAGNOSIS — M79676 Pain in unspecified toe(s): Secondary | ICD-10-CM | POA: Diagnosis not present

## 2020-04-01 DIAGNOSIS — E1169 Type 2 diabetes mellitus with other specified complication: Secondary | ICD-10-CM

## 2020-04-01 DIAGNOSIS — B351 Tinea unguium: Secondary | ICD-10-CM | POA: Diagnosis not present

## 2020-04-01 DIAGNOSIS — E669 Obesity, unspecified: Secondary | ICD-10-CM

## 2020-04-01 NOTE — Progress Notes (Signed)
This patient returns to my office for at risk foot care.  This patient requires this care by a professional since this patient will be at risk due to having diabetes.  This patient is unable to cut nails himself since the patient cannot reach his nails.These nails are painful walking and wearing shoes.  This patient presents for at risk foot care today.  General Appearance  Alert, conversant and in no acute stress.  Vascular  Dorsalis pedis and posterior tibial  pulses are palpable  bilaterally.  Capillary return is within normal limits  bilaterally. Temperature is within normal limits  bilaterally.  Neurologic  Senn-Weinstein monofilament wire test within normal limits  bilaterally. Muscle power within normal limits bilaterally.  Nails Thick disfigured discolored nails with subungual debris  from hallux to fifth toes bilaterally. No evidence of bacterial infection or drainage bilaterally.  Orthopedic  No limitations of motion  feet .  No crepitus or effusions noted.  No bony pathology or digital deformities noted.  Skin  normotropic skin with no porokeratosis noted bilaterally.  No signs of infections or ulcers noted.     Onychomycosis  Pain in right toes  Pain in left toes  Consent was obtained for treatment procedures.   Mechanical debridement of nails 1-5  bilaterally performed with a nail nipper.  Filed with dremel without incident.    Return office visit  10 weeks                    Told patient to return for periodic foot care and evaluation due to potential at risk complications.   Vieva Brummitt DPM   

## 2020-04-19 DIAGNOSIS — R69 Illness, unspecified: Secondary | ICD-10-CM | POA: Diagnosis not present

## 2020-04-27 ENCOUNTER — Telehealth: Payer: Self-pay

## 2020-04-27 NOTE — Chronic Care Management (AMB) (Signed)
Chronic Care Management Pharmacy Assistant   Name: Duane Hughes  MRN: [id number] DOB: 28-Feb-1951  Reason for Encounter: Medication Review  PCP : Danelle Berry, PA-C   04/27/2020- Called patient regarding Atorvastatin 10 mg for Hyperlipidemia. Left message to return call.  04/29/2020- Spoke with patient, inquired if he was taking Atorvastatin 10 mg, patient states yes he was taking it daily. Asked patient how many he has on hand, patient states he has plenty. Asked patient if he has missed taking any days of his medication. Patient stated " no, why would he"?. Inquired on which pharmacy patient uses, verified that he is using [facility], asked patient if he picked up Atorvastatin 10 mg back in July. Patient stated he did not pick up the prescription and that he "just has enough". Unable to get anymore information from patient. Angelena Sole, CPP notified.  Allergies:  No Known Allergies  Medications: Outpatient Encounter Medications as of 04/27/2020  Medication Sig  . atorvastatin (LIPITOR) 10 MG tablet Take 1 tablet (10 mg total) by mouth at bedtime.  . Baclofen 5 MG TABS Take by mouth.  Marland Kitchen buPROPion (WELLBUTRIN SR) 150 MG 12 hr tablet TAKE 1 TABLET (150 MG TOTAL) BY MOUTH 2 (TWO) TIMES DAILY.  Marland Kitchen Cholecalciferol (VITAMIN D) 50 MCG (2000 UT) tablet Take 2,000 Units by mouth daily.  . ferrous sulfate 325 (65 FE) MG EC tablet Take 1 tablet (325 mg total) by mouth 3 (three) times a week. (Patient taking differently: Take 325 mg by mouth 3 days. )  . lamoTRIgine (LAMICTAL) 200 MG tablet Take 1 tablet (200 mg total) by mouth daily.  Marland Kitchen lisinopril (ZESTRIL) 5 MG tablet Take 0.5 tablets (2.5 mg total) by mouth daily.  . metFORMIN (GLUCOPHAGE) 500 MG tablet Take 2 tablets with breakfast and 1 tablet with dinner daily. (Patient taking differently: Take 500-1,000 mg by mouth See admin instructions. Take 1000 mg in the morning and 500 mg in the afternoon)  . naproxen (NAPROSYN) 250 MG  tablet Take by mouth 2 (two) times daily with a meal.  . Omega-3 Fatty Acids (FISH OIL) 1000 MG CAPS Take 1,000 mg by mouth daily.   . pramipexole (MIRAPEX) 0.5 MG tablet Take 1 tablet (0.5 mg total) by mouth 2 (two) times daily.  . sildenafil (REVATIO) 20 MG tablet Take 3 to 5 tablets two hours before intercouse on an empty stomach.  Do not take with nitrates.   No facility-administered encounter medications on file as of 04/27/2020.    Current Diagnosis: Patient Active Problem List   Diagnosis Date Noted  . Spinal stenosis of lumbosacral region 11/04/2019  . Myopathy, unspecified 10/21/2019  . Lymphedema 10/16/2019  . Leg weakness, bilateral 10/08/2019  . Positive ANA (antinuclear antibody) 10/08/2019  . Diabetic polyneuropathy associated with type 2 diabetes mellitus (HCC) 03/05/2019  . Paronychia of great toe of right foot 01/30/2019  . Status post total left knee replacement 09/24/2018  . Abdominal aortic aneurysm ([facility]) 09/24/2018  . Peripheral neuropathy 10/29/2017  . Hx of total knee arthroplasty, right 04/04/2017  . LAE (left atrial enlargement) 03/08/2017  . Right atrial enlargement 03/08/2017  . Vitamin D deficiency 11/21/2016  . Hx of hypogonadism 11/17/2016  . Benign neoplasm of ascending colon   . Bilateral low back pain with bilateral sciatica 03/26/2016  . 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 Dyslipidemia 06/22/2015  . Diabetes mellitus type 2 in obese (HCC) 03/19/2015  . Obstructive sleep apnea 03/18/2015  .  Morbid obesity (HCC) 11/11/2014  . Major depressive disorder, recurrent ([facility]) 11/11/2014  . Benign hypertension 11/11/2014    Follow-Up:  Pharmacist Review  Patient is not adherent on Atorvastatin 10 mg.  Billee Cashing, CMA Clinical Pharmacist Assistant [phone]

## 2020-04-29 NOTE — Telephone Encounter (Signed)
Can you please put in the CCM pharmacy referral for HLD, med noncompliance so Trinna Post can do a visit and f/up in statin?  Thanks

## 2020-05-04 ENCOUNTER — Other Ambulatory Visit: Payer: Self-pay | Admitting: Emergency Medicine

## 2020-05-04 DIAGNOSIS — E785 Hyperlipidemia, unspecified: Secondary | ICD-10-CM

## 2020-05-04 NOTE — Progress Notes (Unsigned)
Order put in for [facility] for HDL

## 2020-05-04 NOTE — Telephone Encounter (Signed)
Order put in system 

## 2020-05-20 ENCOUNTER — Ambulatory Visit (INDEPENDENT_AMBULATORY_CARE_PROVIDER_SITE_OTHER): Payer: Medicare HMO

## 2020-05-20 DIAGNOSIS — Z0000 Encounter for general adult medical examination without abnormal findings: Secondary | ICD-10-CM | POA: Diagnosis not present

## 2020-05-20 NOTE — Progress Notes (Signed)
Subjective:   Duane Hughes is a 70 y.o. male who presents for Medicare Annual/Subsequent preventive examination.  Virtual Visit via Telephone Note  I connected with  Duane Hughes on 05/20/20 at  2:50 PM EST by telephone and verified that I am speaking with the correct person using two identifiers.  Location: Patient: home Provider: [place] Persons participating in the virtual visit: [place]   I discussed the limitations, risks, security and privacy concerns of performing an evaluation and management service by telephone and the availability of in person appointments. The patient expressed understanding and agreed to proceed.  Interactive audio and video telecommunications were attempted between this nurse and patient, however failed, due to patient having technical difficulties OR patient did not have access to video capability.  We continued and completed visit with audio only.  Some vital signs may be absent or patient reported.   Clemetine Marker, LPN    Review of Systems     Cardiac Risk Factors include: advanced age (>65men, >79 women);diabetes mellitus;dyslipidemia;male gender;hypertension;obesity (BMI >30kg/m2)     Objective:    There were no vitals filed for this visit. There is no height or weight on file to calculate BMI.  Advanced Directives 05/20/2020 01/26/2020 01/22/2020 05/20/2019 09/24/2018 09/24/2018 09/17/2018  Does Patient Have a Medical Advance Directive? Yes Yes Yes Yes Yes Yes Yes  Type of Paramedic of [place];Living will Living will Living will [place];Living will [place] - -  Does patient want to make changes to medical advance directive? - No - Patient declined - - No - Patient declined No - Patient declined -  Copy of [place] in Chart? Yes - validated most recent copy scanned in chart (See row information) - - Yes - validated most recent copy scanned  in chart (See row information) - - -  Would patient like information on creating a medical advance directive? - - - - - - -    Current Medications (verified) Outpatient Encounter Medications as of 05/20/2020  Medication Sig  . aspirin EC 81 MG tablet Take 81 mg by mouth daily. Swallow whole.  Marland Kitchen atorvastatin (LIPITOR) 10 MG tablet Take 1 tablet (10 mg total) by mouth at bedtime.  Marland Kitchen buPROPion (WELLBUTRIN SR) 150 MG 12 hr tablet TAKE 1 TABLET (150 MG TOTAL) BY MOUTH 2 (TWO) TIMES DAILY.  Marland Kitchen Cholecalciferol (VITAMIN D) 50 MCG (2000 UT) tablet Take 2,000 Units by mouth daily.  . ferrous sulfate 325 (65 FE) MG EC tablet Take 1 tablet (325 mg total) by mouth 3 (three) times a week. (Patient taking differently: Take 325 mg by mouth 3 days.)  . lisinopril (ZESTRIL) 5 MG tablet Take 0.5 tablets (2.5 mg total) by mouth daily.  . metFORMIN (GLUCOPHAGE) 500 MG tablet Take 2 tablets with breakfast and 1 tablet with dinner daily. (Patient taking differently: Take 500-1,000 mg by mouth See admin instructions. Take 1000 mg in the morning and 500 mg in the afternoon)  . naproxen (NAPROSYN) 250 MG tablet Take by mouth 2 (two) times daily with a meal.  . Omega-3 Fatty Acids (FISH OIL) 1000 MG CAPS Take 1,000 mg by mouth daily.   . sildenafil (REVATIO) 20 MG tablet Take 3 to 5 tablets two hours before intercouse on an empty stomach.  Do not take with nitrates.  . lamoTRIgine (LAMICTAL) 200 MG tablet Take 1 tablet (200 mg total) by mouth daily.  . pramipexole (MIRAPEX) 0.5 MG  tablet Take 1 tablet (0.5 mg total) by mouth 2 (two) times daily.  . [DISCONTINUED] Baclofen 5 MG TABS Take by mouth.   No facility-administered encounter medications on file as of 05/20/2020.    Allergies (verified) Patient has no known allergies.   History: Past Medical History:  Diagnosis Date  . Anemia   . Anxiety    Dr.Jessica Eulas Post . well controlled  . Arthritis    back, hands, knees  . Colon polyp 05/09/2016   TUBULAR  ADENOMAS   . Depression   . Diabetes mellitus without complication ([place])    type 2  . Ectatic abdominal aorta ([place]) 04/18/2016   2.9 cm on [place] 2016, unchanged 2017  . History of hiatal hernia    20 years ago  . History of skin cancer    hand  . Hyperlipidemia   . Hypertension 2015   Dr. Sanda Klein  . Morbid obesity ([place]) 11/11/2014  . Neuropathy   . Obesity 11/11/2014  . Sleep apnea    cpap  . Squamous cell carcinoma of skin 04/11/2018   SCC In Situ R dorsum wrist  . Squamous cell carcinoma of skin 06/23/2015   R dorsum hand   Past Surgical History:  Procedure Laterality Date  . COLONOSCOPY WITH PROPOFOL N/A 05/09/2016   Procedure: COLONOSCOPY WITH PROPOFOL;  Surgeon: Lucilla Lame, MD;  Location: [facility] ENDOSCOPY;  Service: Endoscopy;  Laterality: N/A;  . HERNIA REPAIR     Hiatal  . JOINT REPLACEMENT     04-04-17 Dr. Gladstone Lighter   Right knee  . KNEE ARTHROSCOPY     several scopes done  . KNEE CLOSED REDUCTION Right 05/31/2017   Procedure: CLOSED MANIPULATION RIGHT KNEE;  Surgeon: Latanya Maudlin, MD;  Location: [facility];  Service: Orthopedics;  Laterality: Right;  Femoral Block  . LUMBAR LAMINECTOMY/DECOMPRESSION MICRODISCECTOMY N/A 01/26/2020   Procedure: L3, L4 LAMINECTOMIES;  Surgeon: Deetta Perla, MD;  Location: [facility] ORS;  Service: Neurosurgery;  Laterality: N/A;  . MEDIAL PARTIAL KNEE REPLACEMENT Left    2000s?  Marland Kitchen SKIN CANCER EXCISION  Feb 2017   removed from right hand  . 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 TOTAL KNEE ARTHROPLASTY Left 09/24/2018   Procedure: REVISION LEFT KNEE ARTHROPLASTY;  Surgeon: Paralee Cancel, MD;  Location: [facility];  Service: Orthopedics;  Laterality: Left;  70 mins   Family History  Problem Relation Age of Onset  . Alcohol abuse Mother   . Rheum arthritis Mother   . Stroke Maternal Grandmother   . Stroke Paternal  Grandfather   . Dementia Paternal Grandmother   . Cancer Neg Hx   . Diabetes Neg Hx   . Heart disease Neg Hx   . Hypertension Neg Hx   . Kidney disease Neg Hx   . Prostate cancer Neg Hx    Social History   Socioeconomic History  . Marital status: Married    Spouse name: michele Babler  . Number of children: 6  . Years of education: Not on file  . Highest education level: Associate degree: academic program  Occupational History    Comment: part time  Tobacco Use  . Smoking status: Never Smoker  . Smokeless tobacco: Never Used  Vaping Use  . Vaping Use: Never used  Substance and Sexual Activity  . Alcohol use: No    Alcohol/week: 0.0 standard drinks  . Drug use:  No  . Sexual activity: Yes    Partners: Female  Other Topics Concern  . 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 Difficulty of Paying Living Expenses: Not hard at all  Food Insecurity: No Food Insecurity  . Worried About Programme researcher, broadcasting/film/video in the Last Year: Never true  . Ran Out of Food in the Last Year: Never true  Transportation Needs: No Transportation Needs  . Lack of Transportation (Medical): No  . Lack of Transportation (Non-Medical): No  Physical Activity: Sufficiently Active  . Days of Exercise per Week: 7 days  . Minutes of Exercise per Session: 60 min  Stress: No Stress Concern Present  . Feeling of Stress : Not at all  Social Connections: Socially Integrated  . Frequency of Communication with Friends and Family: More than three times a week  . Frequency of Social Gatherings with Friends and Family: Three times a week  . Attends Religious Services: More than 4 times per year  . Active Member of Clubs or Organizations: Yes  . Attends Banker Meetings: 1 to 4 times per year  . Marital Status: Married    Tobacco Counseling Counseling given: Not  Answered   Clinical Intake:  Pre-visit preparation completed: Yes  Pain : No/denies pain     Nutritional Risks: None Diabetes: Yes CBG done?: No Did pt. bring in CBG monitor from home?: No  How often do you need to have someone help you when you read instructions, pamphlets, or other written materials from your doctor or pharmacy?: 1 - Never  Nutrition Risk Assessment:  Has the patient had any N/V/D within the last 2 months?  No  Does the patient have any non-healing wounds?  No  Has the patient had any unintentional weight loss or weight gain?  No   Diabetes:  Is the patient diabetic?  Yes  If diabetic, was a CBG obtained today?  No  Did the patient bring in their glucometer from home?  No  How often do you monitor your CBG's? Pt does not actively check blood sugar.   Financial Strains and Diabetes Management:  Are you having any financial strains with the device, your supplies or your medication? No .  Does the patient want to be seen by Chronic Care Management for management of their diabetes?  No  Would the patient like to be referred to a Nutritionist or for Diabetic Management?  No   Diabetic Exams:  Diabetic Eye Exam: Completed per patient will [facility] records [facility].   Diabetic Foot Exam: Completed 09/01/19.   Interpreter Needed?: No  Information entered by :: Reather Littler LPN   Activities of Daily Living In your present state of health, do you have any difficulty performing the following activities: 05/20/2020 01/22/2020  Hearing? Malvin Johns  Comment wears hearing aids wears hearing aids  Vision? N N  Difficulty concentrating or making decisions? N N  Walking or climbing stairs? N Y  Comment - will get very tired after walking a mile  Dressing or bathing? N N  Doing errands, shopping? N N  Preparing Food and eating ? N -  Using the Toilet? N -  In the past six months, have you accidently leaked urine? N -  Do you have problems with loss of  bowel control? N -  Managing your Medications? N -  Managing your Finances? N -  Housekeeping or managing your Housekeeping? N -  Some recent data might be hidden    Patient Care Team: Danelle Berry, PA-C as PCP - General (Family Medicine) Hulan Fray as Physician Assistant (Urology) Lemar Livings, Merrily Pew, MD (General Surgery) Dayna Barker, MD as Referring Physician (Internal Medicine) Gilda Crease, Latina Craver, MD (Vascular Surgery) Lucy Chris, MD as Consulting Physician (Neurosurgery) Helane Gunther, DPM as Consulting Physician (Podiatry) Deirdre Evener, MD (Dermatology)  Indicate any recent Medical Services you may have received from other than [facility] providers in the past year (date may be approximate).     Assessment:   This is a routine wellness examination for Doyal.  Hearing/Vision screen  Hearing Screening   125Hz  250Hz  500Hz  1000Hz  2000Hz  3000Hz  4000Hz  6000Hz  8000Hz   Right ear:           Left ear:           Comments: Pt wears hearing aids   Vision Screening Comments: Annual vision screenings with Dr.  Dietary issues and exercise activities discussed: Current Exercise Habits: Home exercise routine, Type of exercise: walking;Other - see comments (golfing), Time (Minutes): 60, Frequency (Times/Week): 7, Weekly Exercise (Minutes/Week): 420, Intensity: Mild, Exercise limited by: neurologic condition(s)  Goals    . DIET - INCREASE WATER INTAKE     Recommend drinking 6-8 glasses of water per day    . Weight (lb) < 250 lb (113.4 kg)     Pt would like to lose 10 lbs over the next year      Depression Screen PHQ 2/9 Scores 05/20/2020 11/04/2019 09/18/2019 09/04/2019 05/20/2019 05/20/2019 03/31/2019  PHQ - 2 Score 0 0 0 0 1 0 0  PHQ- 9 Score - 0 0 0 3 - 0    Fall Risk Fall Risk  05/20/2020 11/04/2019 09/18/2019 09/04/2019 05/20/2019  Falls in the past year? 0 1 0 0 0  Comment - - - - -  Number falls in past yr: 0 1 0 0 0  Injury with Fall? 0 1 0 0 0   Risk for fall due to : No Fall Risks - - - Impaired balance/gait  Follow up Falls prevention discussed Falls evaluation completed - - Falls prevention discussed    FALL RISK PREVENTION PERTAINING TO THE HOME:  Any stairs in or around the home? Yes  If so, are there any without handrails? No  Home free of loose throw rugs in walkways, pet beds, electrical cords, etc? Yes  Adequate lighting in your home to reduce risk of falls? Yes   ASSISTIVE DEVICES UTILIZED TO PREVENT FALLS:  Life alert? No  Use of a cane, walker or w/c? No  Grab bars in the bathroom? Yes  Shower chair or bench in shower? No  Elevated toilet seat or a handicapped toilet? No   TIMED UP AND GO:  Was the test performed? No . Telephonic visit  Cognitive Function:     6CIT Screen 04/18/2018  What Year? 0 points  What month? 0 points  What time? 0 points  Count back from 20 0 points  Months in reverse 0 points  Repeat phrase 0 points  Total Score 0    Immunizations Immunization History  Administered Date(s) Administered  . Fluad Quad(high Dose 65+) 01/15/2019  . Influenza, High Dose Seasonal PF 02/24/2016  . Influenza,inj,Quad PF,6+ Mos 03/18/2015, 02/28/2018  . Pneumococcal Conjugate-13 12/17/2014  . Pneumococcal Polysaccharide-23 04/05/2016  . Zoster 05/01/2009  TDAP status: Due, Education has been provided regarding the importance of this vaccine. Advised may receive this vaccine at local pharmacy or Health Dept. Aware to provide a copy of the vaccination record if obtained from local pharmacy or Health Dept. Verbalized acceptance and understanding.  Flu Vaccine status: Up to date  Pneumococcal vaccine status: Up to date  Covid-19 vaccine status: Completed vaccines. Pt advised to bring a copy of vaccine record to next appt.   Qualifies for Shingles Vaccine? Yes   Zostavax completed Yes   Shingrix Completed?: No.    Education has been provided regarding the importance of this vaccine. Patient  has been advised to call insurance company to determine out of pocket expense if they have not yet received this vaccine. Advised may also receive vaccine at local pharmacy or Health Dept. Verbalized acceptance and understanding.   Screening Tests Health Maintenance  Topic Date Due  . COVID-19 Vaccine (1) Never done  . INFLUENZA VACCINE  11/30/2019  . OPHTHALMOLOGY EXAM  12/23/2019  . HEMOGLOBIN A1C  03/06/2020  . FOOT EXAM  08/31/2020  . COLONOSCOPY (Pts 45-7yrs Insurance coverage will need to be confirmed)  05/09/2021  . TETANUS/TDAP  03/01/2028  . Hepatitis C Screening  Completed  . PNA vac Low Risk Adult  Completed    Health Maintenance  Health Maintenance Due  Topic Date Due  . COVID-19 Vaccine (1) Never done  . INFLUENZA VACCINE  11/30/2019  . OPHTHALMOLOGY EXAM  12/23/2019  . HEMOGLOBIN A1C  03/06/2020    Colorectal cancer screening: Type of screening: Colonoscopy. Completed 05/09/16. Repeat every 5 years  Lung Cancer Screening: (Low Dose CT Chest recommended if Age 92-80 years, 30 pack-year currently smoking OR have quit w/in 15years.) does not qualify.    Additional Screening:  Hepatitis C Screening: does qualify; Completed 09/19/16  Vision Screening: Recommended annual ophthalmology exams for early detection of glaucoma and other disorders of the eye. Is the patient up to date with their annual eye exam?  Yes  Who is the provider or what is the name of the office in which the patient attends annual eye exams? [place] Screening: Recommended annual dental exams for proper oral hygiene  Community Resource Referral / Chronic Care Management: CRR required this visit?  No   CCM required this visit?  No      Plan:     I have personally reviewed and noted the following in the patient's chart:   . Medical and social history . Use of alcohol, tobacco or illicit drugs  . Current medications and supplements . Functional ability and  status . Nutritional status . Physical activity . Advanced directives . List of other physicians . Hospitalizations, surgeries, and ER visits in previous 12 months . Vitals . Screenings to include cognitive, depression, and falls . Referrals and appointments  In addition, I have reviewed and discussed with patient certain preventive protocols, quality metrics, and best practice recommendations. A written personalized care plan for preventive services as well as general preventive health recommendations were provided to patient.     Clemetine Marker, LPN   [id number]   Nurse Notes: none

## 2020-05-20 NOTE — Patient Instructions (Signed)
Duane Hughes , Thank you for taking time to come for your Medicare Wellness Visit. I appreciate your ongoing commitment to your health goals. Please review the following plan we discussed and let me know if I can assist you in the future.   Screening recommendations/referrals: Colonoscopy: done 05/09/16. Repeat in 2023 Recommended yearly ophthalmology/optometry visit for glaucoma screening and checkup Recommended yearly dental visit for hygiene and checkup  Vaccinations: Influenza vaccine: please bring a record of your flu vaccine to your next appt Pneumococcal vaccine: done 04/05/16 Tdap vaccine: due Shingles vaccine: Shingrix discussed. Please contact your pharmacy for coverage information.  Covid-19: please bring a copy of your vaccine record to your next appt  Conditions/risks identified: Keep up the great work!  Next appointment: Follow up in one year for your annual wellness visit.   Preventive Care 31 Years and Older, Male Preventive care refers to lifestyle choices and visits with your health care provider that can promote health and wellness. What does preventive care include?  A yearly physical exam. This is also called an annual well check.  Dental exams once or twice a year.  Routine eye exams. Ask your health care provider how often you should have your eyes checked.  Personal lifestyle choices, including:  Daily care of your teeth and gums.  Regular physical activity.  Eating a healthy diet.  Avoiding tobacco and drug use.  Limiting alcohol use.  Practicing safe sex.  Taking low doses of aspirin every day.  Taking vitamin and mineral supplements as recommended by your health care provider. What happens during an annual well check? The services and screenings done by your health care provider during your annual well check will depend on your age, overall health, lifestyle risk factors, and family history of disease. Counseling  Your health care provider may ask  you questions about your:  Alcohol use.  Tobacco use.  Drug use.  Emotional well-being.  Home and relationship well-being.  Sexual activity.  Eating habits.  History of falls.  Memory and ability to understand (cognition).  Work and work Statistician. Screening  You may have the following tests or measurements:  Height, weight, and BMI.  Blood pressure.  Lipid and cholesterol levels. These may be checked every 5 years, or more frequently if you are over 99 years old.  Skin check.  Lung cancer screening. You may have this screening every year starting at age 50 if you have a 30-pack-year history of smoking and currently smoke or have quit within the past 15 years.  Fecal occult blood test (FOBT) of the stool. You may have this test every year starting at age 20.  Flexible sigmoidoscopy or colonoscopy. You may have a sigmoidoscopy every 5 years or a colonoscopy every 10 years starting at age 75.  Prostate cancer screening. Recommendations will vary depending on your family history and other risks.  Hepatitis C blood test.  Hepatitis B blood test.  Sexually transmitted disease (STD) testing.  Diabetes screening. This is done by checking your blood sugar (glucose) after you have not eaten for a while (fasting). You may have this done every 1-3 years.  Abdominal aortic aneurysm (AAA) screening. You may need this if you are a current or former smoker.  Osteoporosis. You may be screened starting at age 64 if you are at high risk. Talk with your health care provider about your test results, treatment options, and if necessary, the need for more tests. Vaccines  Your health care provider may recommend certain vaccines,  such as:  Influenza vaccine. This is recommended every year.  Tetanus, diphtheria, and acellular pertussis (Tdap, Td) vaccine. You may need a Td booster every 10 years.  Zoster vaccine. You may need this after age 33.  Pneumococcal 13-valent conjugate  (PCV13) vaccine. One dose is recommended after age 21.  Pneumococcal polysaccharide (PPSV23) vaccine. One dose is recommended after age 107. Talk to your health care provider about which screenings and vaccines you need and how often you need them. This information is not intended to replace advice given to you by your health care provider. Make sure you discuss any questions you have with your health care provider. Document Released: 05/14/2015 Document Revised: 01/05/2016 Document Reviewed: 02/16/2015 Elsevier Interactive Patient Education  2017 [place] Prevention in the Home Falls can cause injuries. They can happen to people of all ages. There are many things you can do to make your home safe and to help prevent falls. What can I do on the outside of my home?  Regularly fix the edges of walkways and driveways and fix any cracks.  Remove anything that might make you trip as you walk through a door, such as a raised step or threshold.  Trim any bushes or trees on the path to your home.  Use bright outdoor lighting.  Clear any walking paths of anything that might make someone trip, such as rocks or tools.  Regularly check to see if handrails are loose or broken. Make sure that both sides of any steps have handrails.  Any raised decks and porches should have guardrails on the edges.  Have any leaves, snow, or ice cleared regularly.  Use sand or salt on walking paths during winter.  Clean up any spills in your garage right away. This includes oil or grease spills. What can I do in the bathroom?  Use night lights.  Install grab bars by the toilet and in the tub and shower. Do not use towel bars as grab bars.  Use non-skid mats or decals in the tub or shower.  If you need to sit down in the shower, use a plastic, non-slip stool.  Keep the floor dry. Clean up any water that spills on the floor as soon as it happens.  Remove soap buildup in the tub or shower  regularly.  Attach bath mats securely with double-sided non-slip rug tape.  Do not have throw rugs and other things on the floor that can make you trip. What can I do in the bedroom?  Use night lights.  Make sure that you have a light by your bed that is easy to reach.  Do not use any sheets or blankets that are too big for your bed. They should not hang down onto the floor.  Have a firm chair that has side arms. You can use this for support while you get dressed.  Do not have throw rugs and other things on the floor that can make you trip. What can I do in the kitchen?  Clean up any spills right away.  Avoid walking on wet floors.  Keep items that you use a lot in easy-to-reach places.  If you need to reach something above you, use a strong step stool that has a grab bar.  Keep electrical cords out of the way.  Do not use floor polish or wax that makes floors slippery. If you must use wax, use non-skid floor wax.  Do not have throw rugs and other things on  the floor that can make you trip. What can I do with my stairs?  Do not leave any items on the stairs.  Make sure that there are handrails on both sides of the stairs and use them. Fix handrails that are broken or loose. Make sure that handrails are as long as the stairways.  Check any carpeting to make sure that it is firmly attached to the stairs. Fix any carpet that is loose or worn.  Avoid having throw rugs at the top or bottom of the stairs. If you do have throw rugs, attach them to the floor with carpet tape.  Make sure that you have a light switch at the top of the stairs and the bottom of the stairs. If you do not have them, ask someone to add them for you. What else can I do to help prevent falls?  Wear shoes that:  Do not have high heels.  Have rubber bottoms.  Are comfortable and fit you well.  Are closed at the toe. Do not wear sandals.  If you use a stepladder:  Make sure that it is fully  opened. Do not climb a closed stepladder.  Make sure that both sides of the stepladder are locked into place.  Ask someone to hold it for you, if possible.  Clearly mark and make sure that you can see:  Any grab bars or handrails.  First and last steps.  Where the edge of each step is.  Use tools that help you move around (mobility aids) if they are needed. These include:  Canes.  Walkers.  Scooters.  Crutches.  Turn on the lights when you go into a dark area. Replace any light bulbs as soon as they burn out.  Set up your furniture so you have a clear path. Avoid moving your furniture around.  If any of your floors are uneven, fix them.  If there are any pets around you, be aware of where they are.  Review your medicines with your doctor. Some medicines can make you feel dizzy. This can increase your chance of falling. Ask your doctor what other things that you can do to help prevent falls. This information is not intended to replace advice given to you by your health care provider. Make sure you discuss any questions you have with your health care provider. Document Released: 02/11/2009 Document Revised: 09/23/2015 Document Reviewed: 05/22/2014 Elsevier Interactive Patient Education  2017 [employer].

## 2020-05-23 ENCOUNTER — Encounter: Payer: Self-pay | Admitting: Family Medicine

## 2020-05-23 DIAGNOSIS — F331 Major depressive disorder, recurrent, moderate: Secondary | ICD-10-CM

## 2020-05-23 DIAGNOSIS — E559 Vitamin D deficiency, unspecified: Secondary | ICD-10-CM

## 2020-05-23 DIAGNOSIS — I10 Essential (primary) hypertension: Secondary | ICD-10-CM

## 2020-05-23 DIAGNOSIS — G729 Myopathy, unspecified: Secondary | ICD-10-CM

## 2020-05-23 DIAGNOSIS — E669 Obesity, unspecified: Secondary | ICD-10-CM

## 2020-05-23 DIAGNOSIS — E538 Deficiency of other specified B group vitamins: Secondary | ICD-10-CM

## 2020-05-23 DIAGNOSIS — E785 Hyperlipidemia, unspecified: Secondary | ICD-10-CM

## 2020-05-23 DIAGNOSIS — E1169 Type 2 diabetes mellitus with other specified complication: Secondary | ICD-10-CM

## 2020-05-23 DIAGNOSIS — E611 Iron deficiency: Secondary | ICD-10-CM

## 2020-05-23 DIAGNOSIS — G629 Polyneuropathy, unspecified: Secondary | ICD-10-CM

## 2020-06-07 ENCOUNTER — Encounter: Payer: Self-pay | Admitting: Family Medicine

## 2020-06-07 DIAGNOSIS — E1169 Type 2 diabetes mellitus with other specified complication: Secondary | ICD-10-CM | POA: Diagnosis not present

## 2020-06-07 DIAGNOSIS — G629 Polyneuropathy, unspecified: Secondary | ICD-10-CM | POA: Diagnosis not present

## 2020-06-07 DIAGNOSIS — E538 Deficiency of other specified B group vitamins: Secondary | ICD-10-CM | POA: Diagnosis not present

## 2020-06-07 DIAGNOSIS — E611 Iron deficiency: Secondary | ICD-10-CM | POA: Diagnosis not present

## 2020-06-07 DIAGNOSIS — E559 Vitamin D deficiency, unspecified: Secondary | ICD-10-CM | POA: Diagnosis not present

## 2020-06-07 DIAGNOSIS — E785 Hyperlipidemia, unspecified: Secondary | ICD-10-CM | POA: Diagnosis not present

## 2020-06-07 DIAGNOSIS — E669 Obesity, unspecified: Secondary | ICD-10-CM | POA: Diagnosis not present

## 2020-06-07 DIAGNOSIS — I10 Essential (primary) hypertension: Secondary | ICD-10-CM | POA: Diagnosis not present

## 2020-06-08 LAB — COMPLETE METABOLIC PANEL WITH GFR
AG Ratio: 2 (calc) (ref 1.0–2.5)
ALT: 30 U/L (ref 9–46)
AST: 26 U/L (ref 10–35)
Albumin: 3.9 g/dL (ref 3.6–5.1)
Alkaline phosphatase (APISO): 57 U/L (ref 35–144)
BUN: 15 mg/dL (ref 7–25)
CO2: 23 mmol/L (ref 20–32)
Calcium: 8.4 mg/dL — ABNORMAL LOW (ref 8.6–10.3)
Chloride: 108 mmol/L (ref 98–110)
Creat: 0.9 mg/dL (ref 0.70–1.18)
GFR, Est African American: 100 mL/min/{1.73_m2} (ref >=60)
GFR, Est Non African American: 86 mL/min/{1.73_m2} (ref >=60)
Globulin: 2 g/dL (calc) (ref 1.9–3.7)
Glucose, Bld: 105 mg/dL — ABNORMAL HIGH (ref 65–99)
Potassium: 4.3 mmol/L (ref 3.5–5.3)
Sodium: 140 mmol/L (ref 135–146)
Total Bilirubin: 0.2 mg/dL (ref 0.2–1.2)
Total Protein: 5.9 g/dL — ABNORMAL LOW (ref 6.1–8.1)

## 2020-06-08 LAB — IRON,TIBC AND FERRITIN PANEL
%SAT: 16 % (calc) — ABNORMAL LOW (ref 20–48)
Ferritin: 19 ng/mL — ABNORMAL LOW (ref 24–380)
Iron: 64 ug/dL (ref 50–180)
TIBC: 396 mcg/dL (calc) (ref 250–425)

## 2020-06-08 LAB — CBC WITH DIFFERENTIAL/PLATELET
Absolute Monocytes: 473 cells/uL (ref 200–950)
Basophils Absolute: 52 cells/uL (ref 0–200)
Basophils Relative: 1 %
Eosinophils Absolute: 312 cells/uL (ref 15–500)
Eosinophils Relative: 6 %
HCT: 42.6 % (ref 38.5–50.0)
Hemoglobin: 14.5 g/dL (ref 13.2–17.1)
Lymphs Abs: 1404 cells/uL (ref 850–3900)
MCH: 31.3 pg (ref 27.0–33.0)
MCHC: 34 g/dL (ref 32.0–36.0)
MCV: 92 fL (ref 80.0–100.0)
MPV: 9.2 fL (ref 7.5–12.5)
Monocytes Relative: 9.1 %
Neutro Abs: 2959 cells/uL (ref 1500–7800)
Neutrophils Relative %: 56.9 %
Platelets: 250 10*3/uL (ref 140–400)
RBC: 4.63 10*6/uL (ref 4.20–5.80)
RDW: 13.6 % (ref 11.0–15.0)
Total Lymphocyte: 27 %
WBC: 5.2 10*3/uL (ref 3.8–10.8)

## 2020-06-08 LAB — LIPID PANEL
Cholesterol: 142 mg/dL (ref <200)
HDL: 40 mg/dL (ref >=40)
LDL Cholesterol (Calc): 82 mg/dL (calc)
Non-HDL Cholesterol (Calc): 102 mg/dL (calc) (ref <130)
Total CHOL/HDL Ratio: 3.6 (calc) (ref <5.0)
Triglycerides: 102 mg/dL (ref <150)

## 2020-06-08 LAB — VITAMIN D 25 HYDROXY (VIT D DEFICIENCY, FRACTURES): Vit D, 25-Hydroxy: 29 ng/mL — ABNORMAL LOW (ref 30–100)

## 2020-06-08 LAB — HEMOGLOBIN A1C
Hgb A1c MFr Bld: 6.9 % of total Hgb — ABNORMAL HIGH (ref <5.7)
Mean Plasma Glucose: 151 mg/dL
eAG (mmol/L): 8.4 mmol/L

## 2020-06-08 LAB — VITAMIN B12: Vitamin B-12: 330 pg/mL (ref 200–1100)

## 2020-06-09 ENCOUNTER — Encounter: Payer: Self-pay | Admitting: Family Medicine

## 2020-06-10 ENCOUNTER — Ambulatory Visit: Payer: Medicare HMO | Admitting: Family Medicine

## 2020-06-10 ENCOUNTER — Other Ambulatory Visit: Payer: Self-pay

## 2020-06-10 DIAGNOSIS — I10 Essential (primary) hypertension: Secondary | ICD-10-CM

## 2020-06-10 DIAGNOSIS — Z5181 Encounter for therapeutic drug level monitoring: Secondary | ICD-10-CM

## 2020-06-10 DIAGNOSIS — E1169 Type 2 diabetes mellitus with other specified complication: Secondary | ICD-10-CM

## 2020-06-10 DIAGNOSIS — F331 Major depressive disorder, recurrent, moderate: Secondary | ICD-10-CM

## 2020-06-10 DIAGNOSIS — E785 Hyperlipidemia, unspecified: Secondary | ICD-10-CM

## 2020-06-18 ENCOUNTER — Other Ambulatory Visit: Payer: Self-pay | Admitting: Emergency Medicine

## 2020-06-18 DIAGNOSIS — E669 Obesity, unspecified: Secondary | ICD-10-CM

## 2020-06-18 DIAGNOSIS — E1169 Type 2 diabetes mellitus with other specified complication: Secondary | ICD-10-CM

## 2020-07-02 ENCOUNTER — Telehealth: Payer: Self-pay | Admitting: *Deleted

## 2020-07-02 NOTE — Chronic Care Management (AMB) (Signed)
  Chronic Care Management   Outreach Note  07/02/2020 Name: Duane Hughes MRN: [id number] DOB: Sep 26, 1950  Duane Hughes is a 70 y.o. year old male who is a primary care patient of Delsa Grana, [place]. I reached out to Duane Hughes by phone today in response to a referral sent by [name]'s PCP, Delsa Grana PA-C.     A telephone outreach was attempted today. The patient was referred to the case management team for assistance with care management and care coordination.   Follow Up Plan: A HIPAA compliant phone message was left for the patient providing contact information and requesting a return call. The care management team will reach out to the patient again over the next 7 days. If patient returns call to provider office, please advise to call [place] at [phone].  [place] [facility]

## 2020-07-07 NOTE — Chronic Care Management (AMB) (Signed)
  Chronic Care Management   Note  07/07/2020 Name: Duane Hughes MRN: [id number] DOB: 1950/09/15  Duane Hughes is a 70 y.o. year old male who is a primary care patient of Delsa Grana, [place]. I reached out to [name] by phone today in response to a referral sent by Duane Hughes's PCP, Delsa Grana, PA-C.     [name] was given information about Chronic Care Management services today including:  1. CCM service includes personalized support from designated clinical staff supervised by his physician, including individualized plan of care and coordination with other care providers 2. 24/7 contact phone numbers for assistance for urgent and routine care needs. 3. Service will only be billed when office clinical staff spend 20 minutes or more in a month to coordinate care. 4. Only one practitioner may furnish and bill the service in a calendar month. 5. The patient may stop CCM services at any time (effective at the end of the month) by phone call to the office staff. 6. The patient will be responsible for cost sharing (co-pay) of up to 20% of the service fee (after annual deductible is met).  Patient agreed to services and verbal consent obtained.   Follow up plan: Telephone appointment with care management team member scheduled for:07/14/2020  [place] [facility]

## 2020-07-08 ENCOUNTER — Other Ambulatory Visit: Payer: Self-pay

## 2020-07-08 ENCOUNTER — Encounter: Payer: Self-pay | Admitting: Podiatry

## 2020-07-08 ENCOUNTER — Ambulatory Visit: Payer: Medicare HMO | Admitting: Podiatry

## 2020-07-08 DIAGNOSIS — B351 Tinea unguium: Secondary | ICD-10-CM

## 2020-07-08 DIAGNOSIS — M79676 Pain in unspecified toe(s): Secondary | ICD-10-CM | POA: Diagnosis not present

## 2020-07-08 DIAGNOSIS — E1169 Type 2 diabetes mellitus with other specified complication: Secondary | ICD-10-CM

## 2020-07-08 DIAGNOSIS — E669 Obesity, unspecified: Secondary | ICD-10-CM

## 2020-07-08 NOTE — Progress Notes (Signed)
This patient returns to my office for at risk foot care.  This patient requires this care by a professional since this patient will be at risk due to having diabetes.  This patient is unable to cut nails himself since the patient cannot reach his nails.These nails are painful walking and wearing shoes.  This patient presents for at risk foot care today.  General Appearance  Alert, conversant and in no acute stress.  Vascular  Dorsalis pedis and posterior tibial  pulses are palpable  bilaterally.  Capillary return is within normal limits  bilaterally. Temperature is within normal limits  bilaterally.  Neurologic  Senn-Weinstein monofilament wire test within normal limits  bilaterally. Muscle power within normal limits bilaterally.  Nails Thick disfigured discolored nails with subungual debris  from hallux to fifth toes bilaterally. No evidence of bacterial infection or drainage bilaterally.  Orthopedic  No limitations of motion  feet .  No crepitus or effusions noted.  No bony pathology or digital deformities noted.  Skin  normotropic skin with no porokeratosis noted bilaterally.  No signs of infections or ulcers noted.     Onychomycosis  Pain in right toes  Pain in left toes  Consent was obtained for treatment procedures.   Mechanical debridement of nails 1-5  bilaterally performed with a nail nipper.  Filed with dremel without incident.    Return office visit  10 weeks                    Told patient to return for periodic foot care and evaluation due to potential at risk complications.   Ivalene Platte DPM   

## 2020-07-13 ENCOUNTER — Telehealth: Payer: Self-pay

## 2020-07-13 NOTE — Progress Notes (Signed)
Left Voice message  to confirmed patient telephone appointment on 07/14/2020 for CCM at 2:00 pm with Junius Argyle the Clinical pharmacist.   [place] Pharmacist Assistant [phone]

## 2020-07-13 NOTE — Progress Notes (Signed)
Chronic Care Management Pharmacy Note  07/19/2020 Name:  Duane Hughes MRN:  [id number] DOB:  Aug 30, 1950  Subjective: Duane Hughes is an 70 y.o. year old male who is a primary patient of Duane Hughes, [place].  The CCM team was consulted for assistance with disease management and care coordination needs.    Engaged with patient by telephone for initial visit in response to provider referral for pharmacy case management and/or care coordination services.   Consent to Services:  The patient was given the following information about Chronic Care Management services today, agreed to services, and gave verbal consent: 1. CCM service includes personalized support from designated clinical staff supervised by the primary care provider, including individualized plan of care and coordination with other care providers 2. 24/7 contact phone numbers for assistance for urgent and routine care needs. 3. Service will only be billed when office clinical staff spend 20 minutes or more in a month to coordinate care. 4. Only one practitioner may furnish and bill the service in a calendar month. 5.The patient may stop CCM services at any time (effective at the end of the month) by phone call to the office staff. 6. The patient will be responsible for cost sharing (co-pay) of up to 20% of the service fee (after annual deductible is met). Patient agreed to services and consent obtained.  Patient Care Team: Duane Grana, PA-C as PCP - General (Family Medicine) Duane Hughes as Physician Assistant (Urology) Duane Castilla [place] Gleason, MD (General Surgery) Duane Sax, MD as Referring Physician (Internal Medicine) Duane Hughes, Duane Lory, MD (Vascular Surgery) Duane Perla, MD as Consulting Physician (Neurosurgery) Duane Hughes, DPM as Consulting Physician (Podiatry) Duane Bathe, MD (Dermatology) Duane Hughes, [facility] (Pharmacist)  Recent office visits: 11/04/19: Patient presented to Duane Grana, PA-C for follow-up. Patient started on baclofen, oxycodone-APAP, Pregabalin.   Recent consult visits: 03/09/20: Patient presented to Zara Council, PA-C for follow-up. APAP, hydrocortisone, ketoconazole, oxycodone, pregabalin, turmeric stopped.   Hospital visits: None in previous 6 months  Objective:  Lab Results  Component Value Date   CREATININE 0.90 06/07/2020   BUN 15 06/07/2020   GFRNONAA 86 06/07/2020   GFRAA 100 06/07/2020   NA 140 06/07/2020   K 4.3 06/07/2020   CALCIUM 8.4 (L) 06/07/2020   CO2 23 06/07/2020    Lab Results  Component Value Date/Time   HGBA1C 6.9 (H) 06/07/2020 08:12 AM   HGBA1C 6.3 (H) 09/04/2019 02:48 PM   HGBA1C 7.0 (H) 04/07/2015 03:46 PM   MICROALBUR 0.6 11/27/2017 02:19 PM   MICROALBUR <0.2 04/04/2016 11:44 AM    Last diabetic Eye exam:  Lab Results  Component Value Date/Time   HMDIABEYEEXA No Retinopathy 12/23/2018 12:00 AM    Last diabetic Foot exam: No results found for: HMDIABFOOTEX   Lab Results  Component Value Date   CHOL 142 06/07/2020   HDL 40 06/07/2020   LDLCALC 82 06/07/2020   TRIG 102 06/07/2020   CHOLHDL 3.6 06/07/2020    Hepatic Function Latest Ref Rng & Units 06/07/2020 11/04/2019 09/04/2019  Total Protein 6.1 - 8.1 g/dL 5.9(L) 5.6(L) 5.5(L)  Albumin 3.6 - 5.1 g/dL - - -  AST 10 - 35 U/L $Remo'26 30 27  'DqQby$ ALT 9 - 46 U/L 30 40 31  Alk Phosphatase 40 - 115 U/L - - -  Total Bilirubin 0.2 - 1.2 mg/dL 0.2 0.4 0.3    Lab Results  Component Value Date/Time   TSH 3.13 09/04/2019 02:48 PM  TSH 1.99 03/31/2019 12:00 AM   FREET4 1.1 03/31/2019 12:00 AM   FREET4 1.0 11/16/2016 03:26 PM    CBC Latest Ref Rng & Units 06/07/2020 01/22/2020 11/04/2019  WBC 3.8 - 10.8 Thousand/uL 5.2 8.6 5.7  Hemoglobin 13.2 - 17.1 g/dL 78.1 27.8 10.8  Hematocrit 38.5 - 50.0 % 42.6 47.9 45.8  Platelets 140 - 400 Thousand/uL 250 302 271    Lab Results  Component Value Date/Time   VD25OH 29 (L) 06/07/2020 08:12 AM   VD25OH 27 (L) 11/04/2019  03:26 PM    Clinical ASCVD: No  The 10-year ASCVD risk score Denman George DC Hughes., et al., 2013) is: 39.2%   Values used to calculate the score:     Age: 86 years     Sex: Male     Is Non-Hispanic African American: No     Diabetic: Yes     Tobacco smoker: No     Systolic Blood Pressure: 141 mmHg     Is BP treated: Yes     HDL Cholesterol: 40 mg/dL     Total Cholesterol: 142 mg/dL    Depression screen [facility] 2/9 05/20/2020 11/04/2019 09/18/2019  Decreased Interest 0 0 0  Down, Depressed, Hopeless 0 0 0  PHQ - 2 Score 0 0 0  Altered sleeping - 0 0  Tired, decreased energy - 0 0  Change in appetite - 0 0  Feeling bad or failure about yourself  - 0 0  Trouble concentrating - 0 0  Moving slowly or fidgety/restless - 0 0  Suicidal thoughts - 0 0  PHQ-9 Score - 0 0  Difficult doing work/chores - Not difficult at all -  Some recent data might be hidden    Social History   Tobacco Use  Smoking Status Never Smoker  Smokeless Tobacco Never Used   BP Readings from Last 3 Encounters:  03/09/20 (!) 141/87  01/26/20 128/74  01/22/20 131/85   Pulse Readings from Last 3 Encounters:  03/09/20 83  01/26/20 66  01/22/20 78   Wt Readings from Last 3 Encounters:  03/09/20 264 lb (119.7 kg)  01/26/20 255 lb (115.7 kg)  12/10/19 261 lb (118.4 kg)    Assessment/Interventions: Review of patient past medical history, allergies, medications, health status, including review of consultants reports, laboratory and other test data, was performed as part of comprehensive evaluation and provision of chronic care management services.   SDOH:  (Social Determinants of Health) assessments and interventions performed: Yes SDOH Interventions   Flowsheet Row Most Recent Value  SDOH Interventions   Financial Strain Interventions Intervention Not Indicated      CCM Care Plan  No Known Allergies  Medications Reviewed Today    Reviewed by Helane Gunther, DPM (Physician) on 07/08/20 at 1404  Med List Status:  <None>  Medication Order Taking? Sig Documenting Provider Last Dose Status Informant  aspirin EC 81 MG tablet [id number] Yes Take 81 mg by mouth daily. Swallow whole. [provider] Taking Active   atorvastatin (LIPITOR) 10 MG tablet [id number] Yes Take 1 tablet (10 mg total) by mouth at bedtime. Danelle Berry, PA-C Taking Active Self  buPROPion [facility] SR) 150 MG 12 hr tablet [id number] Yes TAKE 1 TABLET (150 MG TOTAL) BY MOUTH 2 (TWO) TIMES DAILY. Corie Chiquito, PMHNP Taking Active   Cholecalciferol (VITAMIN D) 50 MCG (2000 UT) tablet [id number] Yes Take 2,000 Units by mouth daily. [provider] Taking Active Self  ferrous sulfate 325 (65 FE) MG EC tablet [id number] Yes  Take 1 tablet (325 mg total) by mouth 3 (three) times a week.  Patient taking differently: Take 325 mg by mouth 3 days.   Duane Grana, PA-C Taking Active   lamoTRIgine (LAMICTAL) 200 MG tablet [id number]  Take 1 tablet (200 mg total) by mouth daily. Thayer Headings, PMHNP  Expired 05/03/20 2359   lisinopril (ZESTRIL) 5 MG tablet [id number] Yes Take 0.5 tablets (2.5 mg total) by mouth daily. Duane Grana, PA-C Taking Active Self  metFORMIN (GLUCOPHAGE) 500 MG tablet [id number] Yes Take 2 tablets with breakfast and 1 tablet with dinner daily.  Patient taking differently: Take 500-1,000 mg by mouth See admin instructions. Take 1000 mg in the morning and 500 mg in the afternoon   Tapia, Leisa, PA-C Taking Active Self  naproxen (NAPROSYN) 250 MG tablet [id number] Yes Take by mouth 2 (two) times daily with a meal. [provider] Taking Active            Med Note Gloris Ham, [place] [employer] [facility]   Thu May 20, 2020  3:15 PM) Pt takes one tablet once daily  Omega-3 Fatty Acids (FISH OIL) 1000 MG CAPS [id number] Yes Take 1,000 mg by mouth daily.  [provider] Taking Active Self  pramipexole (MIRAPEX) 0.5 MG tablet [id number]  Take 1 tablet (0.5 mg total) by mouth 2 (two) times daily. Thayer Headings, [place]  Expired  05/03/20 2359   sildenafil (REVATIO) 20 MG tablet [id number] Yes Take 3 to 5 tablets two hours before intercouse on an empty stomach.  Do not take with nitrates. Nori Riis, PA-C Taking Active Self  Med List Note Jaymes Graff 03/26/17 1434): CPAP          Patient Active Problem List   Diagnosis Date Noted  . Spinal stenosis of lumbosacral region 11/04/2019  . Myopathy, unspecified 10/21/2019  . Lymphedema 10/16/2019  . Leg weakness, bilateral 10/08/2019  . Positive ANA (antinuclear antibody) 10/08/2019  . Diabetic polyneuropathy associated with type 2 diabetes mellitus ([place]) 03/05/2019  . Paronychia of great toe of right foot 01/30/2019  . Status post total left knee replacement 09/24/2018  . Abdominal aortic aneurysm ([place]) 09/24/2018  . Peripheral neuropathy 10/29/2017  . Hx of total knee arthroplasty, right 04/04/2017  . LAE (left atrial enlargement) 03/08/2017  . Right atrial enlargement 03/08/2017  . Vitamin D deficiency 11/21/2016  . Hx of hypogonadism 11/17/2016  . Benign neoplasm of ascending colon   . Bilateral low back pain with bilateral sciatica 03/26/2016  . 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 Dyslipidemia 06/22/2015  . Diabetes mellitus type 2 in obese ([place]) 03/19/2015  . Obstructive sleep apnea 03/18/2015  . Morbid obesity ([place]) 11/11/2014  . Major depressive disorder, recurrent ([place]) 11/11/2014  . Benign hypertension 11/11/2014     Immunization History  Administered Date(s) Administered  . Fluad Quad(high Dose 65+) 01/15/2019  . Influenza, High Dose Seasonal PF 02/24/2016  . Influenza,inj,Quad PF,6+ Mos 03/18/2015, 02/28/2018  . Pneumococcal Conjugate-13 12/17/2014  . Pneumococcal Polysaccharide-23 04/05/2016  . Zoster 05/01/2009    Conditions to be addressed/monitored:  Hypertension, Hyperlipidemia, Diabetes, Depression and BPH  Care Plan : General Pharmacy (Adult)  Updates made by Duane Hughes,  RPH since 07/19/2020 12:00 AM    Problem: Hypertension, Hyperlipidemia, Diabetes, Depression and BPH   Priority: High    Long-Range Goal: Patient-Specific Goal   Start Date: 07/14/2020  Expected End Date: 01/14/2021  This Visit's Progress: On track  Priority: High  Note:   Current Barriers:  .  Unable to self administer medications as prescribed  Pharmacist Clinical Goal(s):  Marland Kitchen Patient will maintain control of diabetes as evidenced by A1c less than 7%  through collaboration with PharmD and provider.   Interventions: . 1:1 collaboration with Duane Grana, PA-C regarding development and update of comprehensive plan of care as evidenced by provider attestation and co-signature . Inter-disciplinary care team collaboration (see longitudinal plan of care) . Comprehensive medication review performed; medication list updated in electronic medical record  Hypertension (BP goal <140/90) -Controlled -Current treatment: . Lisinopril 5 mg 1/2 tablet daily  -Medications previously tried: NA  -Current home readings: NA -Current dietary habits: Does salt food sometimes, tries to limit as much as possible. -Current exercise: Patient stays active with his job / hobbies: Door dash, golf 3x weekly  -Denies hypotensive/hypertensive symptoms -Educated on Daily salt intake goal < 2300 mg; Exercise goal of 150 minutes per week; Importance of home blood pressure monitoring; -Counseled to monitor BP at home weekly, document, and provide log at future appointments -Recommended to continue current medication  Hyperlipidemia: (LDL goal < 70) -Uncontrolled -Current treatment: . Atorvastatin 10 mg nightly  -Current Antiplatelet treatment: . Aspirin 81 mg daily   -Medications previously tried: NA  -Patient has not filled atorvastatin since November 2021. States he eceived extra supply from atorvastatin 20 mg sister, and has been taking 20 mg every other day until he finishes his supply. Counseled patient on  the importance of only using his own medication when possible and the importance of daily adherence to atorvastatin..   -Educated on Importance of limiting foods high in cholesterol; -Recommended to continue current medication  Diabetes (A1c goal <7%) -Controlled -Current medications: Marland Kitchen Metformin 500 mg  2 tablets AM, 1 tablet PM  -Medications previously tried: NA  -Current home glucose readings . fasting glucose: Does not routinely monitor  . post prandial glucose: NA -Denies hypoglycemic/hyperglycemic symptoms -Educated on Benefits of routine self-monitoring of blood sugar; -Counseled to check feet daily and get yearly eye exams -Recommended to continue current medication  Depression/Anxiety (Goal: maintain stable mood) -Controlled -Current treatment: . Wellbutrin SR 150 mg twice daily  . Lamotrigine 200 mg daily  -Medications previously tried/failed: NA -PHQ9: 0 -GAD7: 0 -Some -Connected with Thayer Headings, PMHNP  for mental health support,  -Educated on Benefits of cognitive-behavioral therapy with or without medication -Recommended to continue current medication  Patient Goals/Self-Care Activities . Patient will:  - check glucose daily before breakfast, document, and provide at future appointments check blood pressure weekly, document, and provide at future appointments  Follow Up Plan: Telephone follow up appointment with care management team member scheduled for: 01/13/2021 at 1:00 PM      Medication Assistance: None required.  Patient affirms current coverage meets needs.  Patient's preferred pharmacy is:  [id number] - [place], [place] [address] [place] [address] Phone: [phone] Fax: [phone]  Uses pill box? Yes Pt endorses 100% compliance  We discussed: Current pharmacy is preferred with insurance plan and patient is satisfied with pharmacy services Patient decided to: Continue current  medication management strategy  Care Plan and Follow Up Patient Decision:  Patient agrees to Care Plan and Follow-up.  Plan: Telephone follow up appointment with care management team member scheduled for:  01/13/2021 at 1:00 PM  [facility] [phone]

## 2020-07-14 ENCOUNTER — Ambulatory Visit (INDEPENDENT_AMBULATORY_CARE_PROVIDER_SITE_OTHER): Payer: Medicare HMO

## 2020-07-14 DIAGNOSIS — I152 Hypertension secondary to endocrine disorders: Secondary | ICD-10-CM

## 2020-07-14 DIAGNOSIS — F331 Major depressive disorder, recurrent, moderate: Secondary | ICD-10-CM

## 2020-07-14 DIAGNOSIS — E1169 Type 2 diabetes mellitus with other specified complication: Secondary | ICD-10-CM

## 2020-07-14 DIAGNOSIS — E669 Obesity, unspecified: Secondary | ICD-10-CM | POA: Diagnosis not present

## 2020-07-14 DIAGNOSIS — R69 Illness, unspecified: Secondary | ICD-10-CM | POA: Diagnosis not present

## 2020-07-14 DIAGNOSIS — E785 Hyperlipidemia, unspecified: Secondary | ICD-10-CM | POA: Diagnosis not present

## 2020-07-14 DIAGNOSIS — E1159 Type 2 diabetes mellitus with other circulatory complications: Secondary | ICD-10-CM

## 2020-07-19 ENCOUNTER — Other Ambulatory Visit: Payer: Self-pay | Admitting: Urology

## 2020-07-19 NOTE — Patient Instructions (Addendum)
Visit Information It was great speaking with you today!  Please let me know if you have any questions about our visit.  Goals Addressed            This Visit's Progress   . Track and Manage My Blood Pressure-Hypertension       Timeframe:  Long-Range Goal Priority:  High Start Date:  07/14/2020                           Expected End Date:  01/14/2021                     Follow Up Date 09/27/2020    - check blood pressure weekly    Why is this important?    You won't feel high blood pressure, but it can still hurt your blood vessels.   High blood pressure can cause heart or kidney problems. It can also cause a stroke.   Making lifestyle changes like losing a little weight or eating less salt will help.   Checking your blood pressure at home and at different times of the day can help to control blood pressure.   If the doctor prescribes medicine remember to take it the way the doctor ordered.   Call the office if you cannot afford the medicine or if there are questions about it.     Notes:        Patient Care Plan: General Pharmacy (Adult)    Problem Identified: Hypertension, Hyperlipidemia, Diabetes, Depression and BPH   Priority: High    Long-Range Goal: Patient-Specific Goal   Start Date: 07/14/2020  Expected End Date: 01/14/2021  This Visit's Progress: On track  Priority: High  Note:   Current Barriers:  . Unable to self administer medications as prescribed  Pharmacist Clinical Goal(s):  Marland Kitchen Patient will maintain control of diabetes as evidenced by A1c less than 7%  through collaboration with PharmD and provider.   Interventions: . 1:1 collaboration with Delsa Grana, PA-C regarding development and update of comprehensive plan of care as evidenced by provider attestation and co-signature . Inter-disciplinary care team collaboration (see longitudinal plan of care) . Comprehensive medication review performed; medication list updated in electronic medical  record  Hypertension (BP goal <140/90) -Controlled -Current treatment: . Lisinopril 5 mg 1/2 tablet daily  -Medications previously tried: NA  -Current home readings: NA -Current dietary habits: Does salt food sometimes, tries to limit as much as possible. -Current exercise: Patient stays active with his job / hobbies: Door dash, golf 3x weekly  -Denies hypotensive/hypertensive symptoms -Educated on Daily salt intake goal < 2300 mg; Exercise goal of 150 minutes per week; Importance of home blood pressure monitoring; -Counseled to monitor BP at home weekly, document, and provide log at future appointments -Recommended to continue current medication  Hyperlipidemia: (LDL goal < 70) -Uncontrolled -Current treatment: . Atorvastatin 10 mg nightly  -Current Antiplatelet treatment: . Aspirin 81 mg daily   -Medications previously tried: NA  -Patient has not filled atorvastatin since November 2021. States he eceived extra supply from atorvastatin 20 mg sister, and has been taking 20 mg every other day until he finishes his supply. Counseled patient on the importance of only using his own medication when possible and the importance of daily adherence to atorvastatin..   -Educated on Importance of limiting foods high in cholesterol; -Recommended to continue current medication  Diabetes (A1c goal <7%) -Controlled -Current medications: Marland Kitchen Metformin 500 mg  2 tablets AM, 1 tablet PM  -Medications previously tried: NA  -Current home glucose readings . fasting glucose: Does not routinely monitor  . post prandial glucose: NA -Denies hypoglycemic/hyperglycemic symptoms -Educated on Benefits of routine self-monitoring of blood sugar; -Counseled to check feet daily and get yearly eye exams -Recommended to continue current medication  Depression/Anxiety (Goal: maintain stable mood) -Controlled -Current treatment: . Wellbutrin SR 150 mg twice daily  . Lamotrigine 200 mg daily  -Medications  previously tried/failed: NA -PHQ9: 0 -GAD7: 0 -Some -Connected with Thayer Headings, PMHNP  for mental health support,  -Educated on Benefits of cognitive-behavioral therapy with or without medication -Recommended to continue current medication  Patient Goals/Self-Care Activities . Patient will:  - check glucose daily before breakfast, document, and provide at future appointments check blood pressure weekly, document, and provide at future appointments  Follow Up Plan: Telephone follow up appointment with care management team member scheduled for: 01/13/2021 at 1:00 PM       Mr. Sugg was given information about Chronic Care Management services today including:  1. CCM service includes personalized support from designated clinical staff supervised by his physician, including individualized plan of care and coordination with other care providers 2. 24/7 contact phone numbers for assistance for urgent and routine care needs. 3. Standard insurance, coinsurance, copays and deductibles apply for chronic care management only during months in which we provide at least 20 minutes of these services. Most insurances cover these services at 100%, however patients may be responsible for any copay, coinsurance and/or deductible if applicable. This service may help you avoid the need for more expensive face-to-face services. 4. Only one practitioner may furnish and bill the service in a calendar month. 5. The patient may stop CCM services at any time (effective at the end of the month) by phone call to the office staff.  Patient agreed to services and verbal consent obtained.   The patient verbalized understanding of instructions, educational materials, and care plan provided today and declined offer to receive copy of patient instructions, educational materials, and care plan.   [facility] [phone]

## 2020-07-22 ENCOUNTER — Ambulatory Visit (INDEPENDENT_AMBULATORY_CARE_PROVIDER_SITE_OTHER): Payer: Medicare HMO | Admitting: Psychiatry

## 2020-07-22 ENCOUNTER — Encounter: Payer: Self-pay | Admitting: Psychiatry

## 2020-07-22 ENCOUNTER — Other Ambulatory Visit: Payer: Self-pay

## 2020-07-22 DIAGNOSIS — F329 Major depressive disorder, single episode, unspecified: Secondary | ICD-10-CM | POA: Diagnosis not present

## 2020-07-22 DIAGNOSIS — F32A Depression, unspecified: Secondary | ICD-10-CM

## 2020-07-22 DIAGNOSIS — R69 Illness, unspecified: Secondary | ICD-10-CM | POA: Diagnosis not present

## 2020-07-22 MED ORDER — SERTRALINE HCL 100 MG PO TABS: ORAL_TABLET | ORAL | 1 refills | Status: DC

## 2020-07-22 NOTE — Progress Notes (Signed)
Duane Hughes [id number] 13-Feb-1951 70 y.o.  Subjective:   Patient ID:  Duane Hughes is a 70 y.o. (DOB January 03, 1951) male.  Chief Complaint:  Chief Complaint  Patient presents with  . Other    Mood disturbance    HPI Duane Hughes presents to the office today for follow-up of depression. He reports he "was doing really well" and about a month ago he started experiencing angry episodes and then crying episodes. He reports that he has been "short" with others. He reports that he has been easily irritated, such as with other drivers. He reports that he is occasionally driving aggressively. He reports "waves of sadness." He reports that the sadness lifts after crying. Denies anxiety or worry. He reports that he may not sleep enough. He reports that he has some daytime somnolence. Appetite has been ok. He reports gaining 15 lbs in last year. Energy is ok. Motivation has been ok. Concentration is fair.  Denies SI.   He reports that one of his daughters has not been in contact for awhile "but it's affecting more now.'   Plays golf 2-3 days a week. 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 Went to [employer] recently. Planning trip to [place] and cruise to [place].   Past Psychiatric Medication Trials: Pristiq-ineffective Effexor-effective for 2 years then no longer effective Bupropion-taken for years Cymbalta-helped for 2 years but no longer effective Abilify-not effective at 5 mg and increased emotional lability at 10 mg Lamictal Lithium Pramipexole-effective for mood signs and symptoms Trazodone  GAD-7   Flowsheet Row Office Visit from 11/04/2019 in [facility]  Total GAD-7 Score 0    PHQ2-9   Flowsheet Row Clinical Support from 05/20/2020 in [facility] Office Visit from 11/04/2019 in [facility] Office Visit from 09/18/2019 in [facility] Office Visit from 09/04/2019 in [place] from 05/20/2019 in [facility]  PHQ-2 Total Score 0 0 0 0 1  PHQ-9 Total Score -- 0 0 0 3       Review of Systems:  Review of Systems  Musculoskeletal: Negative for gait problem.  Neurological:       Neuropathy  Psychiatric/Behavioral:       Please refer to HPI   Occ RLS  He reports that back pain improved after surgery. He reports continued neuropathy.    Medications: I have reviewed the patient's current medications.  Current Outpatient Medications  Medication Sig Dispense Refill  . aspirin EC 81 MG tablet Take 81 mg by mouth daily. Swallow whole.    Marland Kitchen atorvastatin (LIPITOR) 10 MG tablet Take 1 tablet (10 mg total) by mouth at bedtime. (Patient taking differently: Take 20 mg by mouth every other day.) 90 tablet 3  . buPROPion (WELLBUTRIN SR) 150 MG 12 hr tablet TAKE 1 TABLET (150 MG TOTAL) BY MOUTH 2 (TWO) TIMES DAILY. 180 tablet 3  . Cholecalciferol (VITAMIN D) 50 MCG (2000 UT) tablet Take 2,000 Units by mouth daily.    . diphenhydramine-acetaminophen (TYLENOL PM) 25-500 MG TABS tablet Take 1 tablet by mouth at bedtime as needed.    . ferrous sulfate 325 (65 FE) MG EC tablet Take 1 tablet (325 mg total) by mouth 3 (three) times a week. (Patient taking differently: Take 325 mg by mouth 3 days.) 36 tablet 3  . lisinopril (ZESTRIL) 5 MG tablet Take 0.5 tablets (2.5 mg total) by mouth daily. 45 tablet 3  .  metFORMIN (GLUCOPHAGE) 500 MG tablet Take 2 tablets with breakfast and 1 tablet with dinner daily. (Patient taking differently: Take 500-1,000 mg by mouth See admin instructions. Take 1000 mg in the morning and 500 mg in the afternoon) 270 tablet 3  . naproxen (NAPROSYN) 250 MG tablet Take 250 mg by mouth daily with breakfast.    . Omega-3 Fatty Acids (FISH OIL) 1000 MG CAPS Take 1,000 mg by mouth daily.     . sertraline (ZOLOFT) 100 MG tablet Take 1/2 tablet daily for one week, then increase to 1 tablet daily 30 tablet 1  . lamoTRIgine  (LAMICTAL) 200 MG tablet Take 1 tablet (200 mg total) by mouth daily. 90 tablet 3  . oxyCODONE-acetaminophen (PERCOCET/ROXICET) 5-325 MG tablet Take 1 tablet by mouth every 8 (eight) hours as needed for severe pain.    . pramipexole (MIRAPEX) 0.5 MG tablet Take 1 tablet (0.5 mg total) by mouth 2 (two) times daily. 180 tablet 3  . sildenafil (REVATIO) 20 MG tablet TAKE 3-5 TABLETS 2 HOURS BEFORE INTERCOURSE ON AN EMPTY STOMACH DO NOT TAKE WITH NITRATES 50 tablet 3   No current facility-administered medications for this visit.    Medication Side Effects: None  Allergies: No Known Allergies  Past Medical History:  Diagnosis Date  . Anemia   . Anxiety    Dr.Mortimer Bair [facility] . well controlled  . Arthritis    back, hands, knees  . Colon polyp 05/09/2016   TUBULAR ADENOMAS   . Depression   . Diabetes mellitus without complication ([place])    type 2  . Ectatic abdominal aorta ([place]) 04/18/2016   2.9 cm on [place] 2016, unchanged 2017  . History of hiatal hernia    20 years ago  . History of skin cancer    hand  . Hyperlipidemia   . Hypertension 2015   Dr. Sanda Klein  . Morbid obesity ([place]) 11/11/2014  . Neuropathy   . Obesity 11/11/2014  . Sleep apnea    cpap  . Squamous cell carcinoma of skin 04/11/2018   SCC In Situ R dorsum wrist  . Squamous cell carcinoma of skin 06/23/2015   R dorsum hand    Family History  Problem Relation Age of Onset  . Alcohol abuse Mother   . Rheum arthritis Mother   . Stroke Maternal Grandmother   . Stroke Paternal Grandfather   . Dementia Paternal Grandmother   . Cancer Neg Hx   . Diabetes Neg Hx   . Heart disease Neg Hx   . Hypertension Neg Hx   . Kidney disease Neg Hx   . Prostate cancer Neg Hx     Social History   Socioeconomic History  . Marital status: Married    Spouse name: michele Biven  . Number of children: 6  . Years of education: Not on file  . Highest education level: Associate degree: academic program  Occupational History     Comment: part time  Tobacco Use  . Smoking status: Never Smoker  . Smokeless tobacco: Never Used  Vaping Use  . Vaping Use: Never used  Substance and Sexual Activity  . Alcohol use: No    Alcohol/week: 0.0 standard drinks  . Drug use: No  . Sexual activity: Yes    Partners: Female  Other Topics Concern  . 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  Difficulty of Paying Living Expenses: Not hard at all  Food Insecurity: No Food Insecurity  . Worried About Charity fundraiser in the Last Year: Never true  . Ran Out of Food in the Last Year: Never true  Transportation Needs: No Transportation Needs  . Lack of Transportation (Medical): No  . Lack of Transportation (Non-Medical): No  Physical Activity: Sufficiently Active  . Days of Exercise per Week: 7 days  . Minutes of Exercise per Session: 60 min  Stress: No Stress Concern Present  . Feeling of Stress : Not at all  Social Connections: Socially Integrated  . Frequency of Communication with Friends and Family: More than three times a week  . Frequency of Social Gatherings with Friends and Family: Three times a week  . Attends Religious Services: More than 4 times per year  . Active Member of Clubs or Organizations: Yes  . Attends Archivist Meetings: 1 to 4 times per year  . Marital Status: Married  Human resources officer Violence: Not At Risk  . Fear of Current or Ex-Partner: No  . Emotionally Abused: No  . Physically Abused: No  . Sexually Abused: No    Past Medical History, Surgical history, Social history, and Family history were reviewed and updated as appropriate.   Please see review of systems for further details on the patient's review from today.   Objective:   Physical Exam:  There were no vitals taken for this visit.  Physical Exam Constitutional:      General: He is not in acute  distress. Musculoskeletal:        General: No deformity.  Neurological:     Mental Status: He is alert and oriented to person, place, and time.     Coordination: Coordination normal.  Psychiatric:        Attention and Perception: Attention and perception normal. He does not perceive auditory or visual hallucinations.        Mood and Affect: Mood is not anxious. Affect is not labile, blunt, angry or inappropriate.        Speech: Speech normal.        Behavior: Behavior normal.        Thought Content: Thought content normal. Thought content is not paranoid or delusional. Thought content does not include homicidal or suicidal ideation. Thought content does not include homicidal or suicidal plan.        Cognition and Memory: Cognition and memory normal.        Judgment: Judgment normal.     Comments: Insight intact Dysphoric mood     Lab Review:     Component Value Date/Time   NA 140 06/07/2020 0812   NA 139 05/12/2015 1537   K 4.3 06/07/2020 0812   CL 108 06/07/2020 0812   CO2 23 06/07/2020 0812   GLUCOSE 105 (H) 06/07/2020 0812   BUN 15 06/07/2020 0812   BUN 15 05/12/2015 1537   CREATININE 0.90 06/07/2020 0812   CALCIUM 8.4 (L) 06/07/2020 0812   PROT 5.9 (L) 06/07/2020 0812   PROT 6.2 03/19/2015 0825   ALBUMIN 4.1 09/19/2016 1400   ALBUMIN 4.0 03/19/2015 0825   AST 26 06/07/2020 0812   ALT 30 06/07/2020 0812   ALKPHOS 46 09/19/2016 1400   BILITOT 0.2 06/07/2020 0812   BILITOT 0.5 03/19/2015 0825   GFRNONAA 86 06/07/2020 0812   GFRAA 100 06/07/2020 0812       Component Value Date/Time   WBC 5.2 06/07/2020 0981  RBC 4.63 06/07/2020 0812   HGB 14.5 06/07/2020 0812   HGB 14.8 06/20/2016 1458   HCT 42.6 06/07/2020 0812   HCT 43.6 06/20/2016 1458   PLT 250 06/07/2020 0812   PLT 260 06/20/2016 1458   MCV 92.0 06/07/2020 0812   MCV 93 06/20/2016 1458   MCH 31.3 06/07/2020 0812   MCHC 34.0 06/07/2020 0812   RDW 13.6 06/07/2020 0812   RDW 13.3 06/20/2016 1458    LYMPHSABS 1,404 06/07/2020 0812   LYMPHSABS 2.0 06/20/2016 1458   MONOABS 0.4 03/29/2017 1343   EOSABS 312 06/07/2020 0812   EOSABS 0.4 06/20/2016 1458   BASOSABS 52 06/07/2020 0812   BASOSABS 0.1 06/20/2016 1458    No results found for: POCLITH, LITHIUM   No results found for: PHENYTOIN, PHENOBARB, VALPROATE, CBMZ   .res Assessment: Plan:   Patient seen for 30 minutes and time spent discussing different treatment options and proposed mechanism of action of his current medications and past medication trials.  Discussed that he has not taken an SSRI in the past and has had trials of multiple SNRIs.  Discussed potential benefits, risks, and side effects of sertraline.  Discussed that sertraline is indicated for both anxiety and depression.  Discussed that sertraline is often beneficial with lowering irritation and irritability.  Patient agrees to trial of sertraline.  Will start sertraline 50 mg daily for 1 week, then increase to 100 mg daily to target mood signs and symptoms. Discussed continuing other medications without changes until response to sertraline is known. Continue bupropion SR 150 mg twice daily for depression. Continue lamotrigine 200 mg daily for mood signs and symptoms. Continue pramipexole 0.5 mg twice daily for off label indication for depression. Patient to follow-up in 4 to 6 weeks or sooner if clinically indicated. Patient advised to contact office with any questions, adverse effects, or acute worsening in signs and symptoms.  Duane Hughes was seen today for other.  Diagnoses and all orders for this visit:  Chronic depressive disorder -     sertraline (ZOLOFT) 100 MG tablet; Take 1/2 tablet daily for one week, then increase to 1 tablet daily     Please see After Visit Summary for patient specific instructions.  Future Appointments  Date Time Provider [place]  09/03/2020  1:45 PM Thayer Headings, PMHNP CP-CP None  09/08/2020  3:00 PM Ralene Bathe, MD  ASC-ASC None  10/14/2020  1:45 PM Gardiner Barefoot, DPM [facility]  10/21/2020  9:30 AM [facility] VASC 2 AVVS-IMG None  10/21/2020 10:30 AM Schnier, Dolores Lory, MD [facility] None  01/13/2021  1:00 PM [place]  02/03/2021  3:30 PM Thayer Headings, PMHNP CP-CP None  03/03/2021  3:00 PM [facility] None  03/10/2021  3:00 PM McGowan, Larene Beach A, PA-C [facility] None  05/24/2021  2:50 PM [place] ADVISOR [facility]    No orders of the defined types were placed in this encounter.   -------------------------------

## 2020-08-20 ENCOUNTER — Telehealth: Payer: Self-pay | Admitting: Family Medicine

## 2020-08-20 NOTE — Telephone Encounter (Signed)
Requested medication (s) are due for refill today: yes  Requested medication (s) are on the active medication list: yes  Last refill: ?  Future visit scheduled: no  Notes to clinic:  historical provider    Requested Prescriptions  Pending Prescriptions Disp Refills   oxyCODONE-acetaminophen (PERCOCET/ROXICET) 5-325 MG tablet 30 tablet     Sig: Take 1 tablet by mouth every 8 (eight) hours as needed for severe pain.      Not Delegated - Analgesics:  Opioid Agonist Combinations Failed - 08/20/2020  9:38 AM      Failed - This refill cannot be delegated      Failed - Urine Drug Screen completed in last 360 days      Passed - Valid encounter within last 6 months    Recent Outpatient Visits           9 months ago Dyslipidemia   [facility] Delsa Grana, PA-C   11 months ago Abnormal laboratory test result   [facility] Delsa Grana, PA-C   11 months ago Diabetic polyneuropathy associated with type 2 diabetes mellitus [facility])   [facility] Delsa Grana, PA-C   1 year ago Hypocalcemia   [facility] Delsa Grana, PA-C   1 year ago Benign hypertension   [place], NP       Future Appointments             In 6 months McGowan, Shannon A, [place]   In 9 months  [facility], [facility]

## 2020-08-20 NOTE — Telephone Encounter (Signed)
Copied from [place] [id number]. Topic: Quick Communication - Rx Refill/Question >> Aug 20, 2020  9:13 AM Tessa Lerner A wrote: Medication: oxyCODONE-acetaminophen (PERCOCET/ROXICET) 5-325 MG tablet   Has the patient contacted their pharmacy? Patient's pharmacy has made contact on their behalf  Preferred Pharmacy (with phone number or street name): [id number] - [place], [place] [address]  Phone:  [phone] Fax:  [phone]  Agent: Please be advised that RX refills may take up to 3 business days. We ask that you follow-up with your pharmacy.

## 2020-08-25 NOTE — Telephone Encounter (Signed)
Appt scheduled

## 2020-08-25 NOTE — Telephone Encounter (Signed)
Patient called to get his med refilled.  Explained that it was requested too soon.  Patient would like to know if he can get it filled now.  Please advise and call patient to confirm.  CB#[phone]

## 2020-08-25 NOTE — Telephone Encounter (Signed)
Last seen: 7.6.2021  Next scheduled: none

## 2020-08-26 ENCOUNTER — Other Ambulatory Visit: Payer: Self-pay

## 2020-08-26 DIAGNOSIS — I10 Essential (primary) hypertension: Secondary | ICD-10-CM

## 2020-08-26 MED ORDER — LISINOPRIL 5 MG PO TABS: 2.5000 mg | ORAL_TABLET | Freq: Every day | ORAL | 0 refills | Status: DC

## 2020-09-03 ENCOUNTER — Other Ambulatory Visit: Payer: Self-pay

## 2020-09-03 ENCOUNTER — Encounter: Payer: Self-pay | Admitting: Psychiatry

## 2020-09-03 ENCOUNTER — Ambulatory Visit (INDEPENDENT_AMBULATORY_CARE_PROVIDER_SITE_OTHER): Payer: Medicare HMO | Admitting: Psychiatry

## 2020-09-03 DIAGNOSIS — F32A Depression, unspecified: Secondary | ICD-10-CM

## 2020-09-03 DIAGNOSIS — R69 Illness, unspecified: Secondary | ICD-10-CM | POA: Diagnosis not present

## 2020-09-03 DIAGNOSIS — F329 Major depressive disorder, single episode, unspecified: Secondary | ICD-10-CM | POA: Diagnosis not present

## 2020-09-03 MED ORDER — PRAMIPEXOLE DIHYDROCHLORIDE 0.5 MG PO TABS
0.5000 mg | ORAL_TABLET | Freq: Two times a day (BID) | ORAL | 3 refills | Status: DC
Start: 2020-09-03 — End: 2021-02-15

## 2020-09-03 MED ORDER — BUPROPION HCL ER (SR) 150 MG PO TB12: ORAL_TABLET | ORAL | 3 refills | Status: DC

## 2020-09-03 MED ORDER — LAMOTRIGINE 200 MG PO TABS: 200.0000 mg | ORAL_TABLET | Freq: Every day | ORAL | 3 refills | Status: DC

## 2020-09-03 MED ORDER — CITALOPRAM HYDROBROMIDE 20 MG PO TABS: ORAL_TABLET | ORAL | 2 refills | Status: DC

## 2020-09-03 NOTE — Progress Notes (Signed)
Duane Hughes [id number] 1951-03-10 70 y.o.  Subjective:   Patient ID:  Duane Hughes is a 70 y.o. (DOB Aug 20, 1950) male.  Chief Complaint:  Chief Complaint  Patient presents with  . Depression  . Other    Irritability    HPI Duane Hughes presents to the office today for follow-up of depression and anxiety.  He reports that he has continued to have loose stools on Sertraline 50 mg. He has remained on 50 mg daily. He reports that he has been feeling increasingly depressed and angry. Denies tearfulness. He reports that he is swearing and getting upset easily. Some aggressive driving. Mood has been persistently depressed. Denies anxiety. Sleep has been poor. Sleeping 3-4 hours a night. He usually does not have trouble falling asleep and will take Tylenol PM prn. Appetite has been good. Motivation is low. Energy is ok. He reports that concentration is somewhat diminished and is missing some details that he normally would not miss. Has been more forgetful. He denies anhedonia. He reports that anger is reducing enjoyment in things. He reports occasional vague, fleeting suicidal thoughts. Denies SI.   He reports that 3 cousins have responded well to Celexa.   He plays golf 2-3 mornings a week. He donates plasma on Tuesdays and Thursdays.  Past Psychiatric Medication Trials: Pristiq-ineffective Effexor-effective for 2 years then no longer effective Bupropion-taken for years Cymbalta-helped for 2 years but no longer effective Sertraline- Not effective. Loose stools. Abilify-not effective at 5 mg and increased emotional lability at 10 mg Lamictal Lithium Pramipexole-effective for mood signs and symptoms Trazodone    GAD-7   Flowsheet Row Office Visit from 11/04/2019 in [facility]  Total GAD-7 Score 0    PHQ2-9   [place] from 05/20/2020 in [facility] Office Visit from 11/04/2019 in [facility] Office  Visit from 09/18/2019 in [facility] Office Visit from 09/04/2019 in [place] from 05/20/2019 in [facility]  PHQ-2 Total Score 0 0 0 0 1  PHQ-9 Total Score -- 0 0 0 3       Review of Systems:  Review of Systems  Musculoskeletal: Negative for gait problem.  Allergic/Immunologic: Positive for environmental allergies.  Neurological:       Worsening neuropathy  Psychiatric/Behavioral:       Please refer to HPI    Medications: I have reviewed the patient's current medications.  Current Outpatient Medications  Medication Sig Dispense Refill  . citalopram (CELEXA) 20 MG tablet Take 1/2 tablet daily for one week, then increase to 1 tablet daily 30 tablet 2  . aspirin EC 81 MG tablet Take 81 mg by mouth daily. Swallow whole.    Marland Kitchen atorvastatin (LIPITOR) 10 MG tablet Take 1 tablet (10 mg total) by mouth at bedtime. (Patient taking differently: Take 20 mg by mouth every other day.) 90 tablet 3  . buPROPion (WELLBUTRIN SR) 150 MG 12 hr tablet TAKE 1 TABLET (150 MG TOTAL) BY MOUTH 2 (TWO) TIMES DAILY. 180 tablet 3  . Cholecalciferol (VITAMIN D) 50 MCG (2000 UT) tablet Take 2,000 Units by mouth daily.    . diphenhydramine-acetaminophen (TYLENOL PM) 25-500 MG TABS tablet Take 1 tablet by mouth at bedtime as needed.    . ferrous sulfate 325 (65 FE) MG EC tablet Take 1 tablet (325 mg total) by mouth 3 (three) times a week. (Patient taking differently: Take 325 mg by mouth 3 days.)  36 tablet 3  . lamoTRIgine (LAMICTAL) 200 MG tablet Take 1 tablet (200 mg total) by mouth daily. 90 tablet 3  . lisinopril (ZESTRIL) 5 MG tablet Take 0.5 tablets (2.5 mg total) by mouth daily. 45 tablet 0  . metFORMIN (GLUCOPHAGE) 500 MG tablet Take 2 tablets with breakfast and 1 tablet with dinner daily. (Patient taking differently: Take 500-1,000 mg by mouth See admin instructions. Take 1000 mg in the morning and 500 mg in the afternoon) 270 tablet 3   . naproxen (NAPROSYN) 250 MG tablet Take 250 mg by mouth daily with breakfast.    . Omega-3 Fatty Acids (FISH OIL) 1000 MG CAPS Take 1,000 mg by mouth daily.     Marland Kitchen oxyCODONE-acetaminophen (PERCOCET/ROXICET) 5-325 MG tablet Take 1 tablet by mouth every 8 (eight) hours as needed for severe pain.    . pramipexole (MIRAPEX) 0.5 MG tablet Take 1 tablet (0.5 mg total) by mouth 2 (two) times daily. 180 tablet 3  . sildenafil (REVATIO) 20 MG tablet TAKE 3-5 TABLETS 2 HOURS BEFORE INTERCOURSE ON AN EMPTY STOMACH DO NOT TAKE WITH NITRATES 50 tablet 3   No current facility-administered medications for this visit.    Medication Side Effects: Other: loose stools  Allergies: No Known Allergies  Past Medical History:  Diagnosis Date  . Anemia   . Anxiety    Dr.Lilah Mijangos [facility] . well controlled  . Arthritis    back, hands, knees  . Colon polyp 05/09/2016   TUBULAR ADENOMAS   . Depression   . Diabetes mellitus without complication ([place])    type 2  . Ectatic abdominal aorta ([place]) 04/18/2016   2.9 cm on [place] 2016, unchanged 2017  . History of hiatal hernia    20 years ago  . History of skin cancer    hand  . Hyperlipidemia   . Hypertension 2015   Dr. Sanda Klein  . Morbid obesity ([place]) 11/11/2014  . Neuropathy   . Obesity 11/11/2014  . Sleep apnea    cpap  . Squamous cell carcinoma of skin 04/11/2018   SCC In Situ R dorsum wrist  . Squamous cell carcinoma of skin 06/23/2015   R dorsum hand    Past Medical History, Surgical history, Social history, and Family history were reviewed and updated as appropriate.   Please see review of systems for further details on the patient's review from today.   Objective:   Physical Exam:  There were no vitals taken for this visit.  Physical Exam Constitutional:      General: He is not in acute distress. Musculoskeletal:        General: No deformity.  Neurological:     Mental Status: He is alert and oriented to person, place, and time.      Coordination: Coordination normal.  Psychiatric:        Attention and Perception: Attention and perception normal. He does not perceive auditory or visual hallucinations.        Mood and Affect: Mood is depressed. Mood is not anxious. Affect is not labile, blunt, angry or inappropriate.        Speech: Speech normal.        Behavior: Behavior normal.        Thought Content: Thought content normal. Thought content is not paranoid or delusional. Thought content does not include homicidal or suicidal ideation. Thought content does not include homicidal or suicidal plan.        Cognition and Memory: Cognition and memory normal.  Judgment: Judgment normal.     Comments: Insight intact Dysphoric mood     Lab Review:     Component Value Date/Time   NA 140 06/07/2020 0812   NA 139 05/12/2015 1537   K 4.3 06/07/2020 0812   CL 108 06/07/2020 0812   CO2 23 06/07/2020 0812   GLUCOSE 105 (H) 06/07/2020 0812   BUN 15 06/07/2020 0812   BUN 15 05/12/2015 1537   CREATININE 0.90 06/07/2020 0812   CALCIUM 8.4 (L) 06/07/2020 0812   PROT 5.9 (L) 06/07/2020 0812   PROT 6.2 03/19/2015 0825   ALBUMIN 4.1 09/19/2016 1400   ALBUMIN 4.0 03/19/2015 0825   AST 26 06/07/2020 0812   ALT 30 06/07/2020 0812   ALKPHOS 46 09/19/2016 1400   BILITOT 0.2 06/07/2020 0812   BILITOT 0.5 03/19/2015 0825   GFRNONAA 86 06/07/2020 0812   GFRAA 100 06/07/2020 0812       Component Value Date/Time   WBC 5.2 06/07/2020 0812   RBC 4.63 06/07/2020 0812   HGB 14.5 06/07/2020 0812   HGB 14.8 06/20/2016 1458   HCT 42.6 06/07/2020 0812   HCT 43.6 06/20/2016 1458   PLT 250 06/07/2020 0812   PLT 260 06/20/2016 1458   MCV 92.0 06/07/2020 0812   MCV 93 06/20/2016 1458   MCH 31.3 06/07/2020 0812   MCHC 34.0 06/07/2020 0812   RDW 13.6 06/07/2020 0812   RDW 13.3 06/20/2016 1458   LYMPHSABS 1,404 06/07/2020 0812   LYMPHSABS 2.0 06/20/2016 1458   MONOABS 0.4 03/29/2017 1343   EOSABS 312 06/07/2020 0812   EOSABS 0.4  06/20/2016 1458   BASOSABS 52 06/07/2020 0812   BASOSABS 0.1 06/20/2016 1458    No results found for: POCLITH, LITHIUM   No results found for: PHENYTOIN, PHENOBARB, VALPROATE, CBMZ   .res Assessment: Plan:   , Pt seen for 30 minutes and time spent discussing possible treatment options and his questions about Celexa. Discussed potential benefits, risks, and side effects of Celexa. Pt agrees to trial of Celexa. Will start Celexa 10 mg po qd for one week, then increase to 20 mg daily for mood s/s.  Will stop Sertraline since he has had persistent GI side effects with Sertraline 50 mg po qd.  Continue Wellbutrin SR 150 mg po BID for depression.  Continue Lamictal 200 mg po qd for mood stabilization.  Continue Pramipexole 0.5 mg po BID for off label indication for depression.  Pt to follow-up in 6 weeks or sooner if clinically indicated.  Patient advised to contact office with any questions, adverse effects, or acute worsening in signs and symptoms.  [place] was seen today for depression and other.  Diagnoses and all orders for this visit:  Chronic depressive disorder -     citalopram (CELEXA) 20 MG tablet; Take 1/2 tablet daily for one week, then increase to 1 tablet daily -     buPROPion (WELLBUTRIN SR) 150 MG 12 hr tablet; TAKE 1 TABLET (150 MG TOTAL) BY MOUTH 2 (TWO) TIMES DAILY. -     lamoTRIgine (LAMICTAL) 200 MG tablet; Take 1 tablet (200 mg total) by mouth daily. -     pramipexole (MIRAPEX) 0.5 MG tablet; Take 1 tablet (0.5 mg total) by mouth 2 (two) times daily.     Please see After Visit Summary for patient specific instructions.  Future Appointments  Date Time Provider [place]  09/08/2020  3:00 PM Ralene Bathe, MD [facility] None  09/16/2020  3:00 PM Delsa Grana, PA-C  [facility]  10/18/2020  1:15 PM Thayer Headings, PMHNP CP-CP None  10/21/2020  9:30 AM [facility] V[facility] 2 [facility] None  10/21/2020 10:30 AM Schnier, Dolores Lory, MD [facility] None  01/13/2021  1:00 PM  [place]  02/03/2021  3:30 PM Thayer Headings, PMHNP CP-CP None  03/03/2021  3:00 PM [facility] None  03/10/2021  3:00 PM McGowan, Larene Beach A, PA-C [facility] None  05/24/2021  2:50 PM [place] ADVISOR [facility]    No orders of the defined types were placed in this encounter.   -------------------------------

## 2020-09-08 ENCOUNTER — Ambulatory Visit: Payer: Medicare HMO | Admitting: Dermatology

## 2020-09-08 ENCOUNTER — Other Ambulatory Visit: Payer: Self-pay

## 2020-09-08 DIAGNOSIS — L814 Other melanin hyperpigmentation: Secondary | ICD-10-CM

## 2020-09-08 DIAGNOSIS — D1800 Hemangioma unspecified site: Secondary | ICD-10-CM

## 2020-09-08 DIAGNOSIS — Z85828 Personal history of other malignant neoplasm of skin: Secondary | ICD-10-CM

## 2020-09-08 DIAGNOSIS — D229 Melanocytic nevi, unspecified: Secondary | ICD-10-CM | POA: Diagnosis not present

## 2020-09-08 DIAGNOSIS — L821 Other seborrheic keratosis: Secondary | ICD-10-CM

## 2020-09-08 DIAGNOSIS — L219 Seborrheic dermatitis, unspecified: Secondary | ICD-10-CM | POA: Diagnosis not present

## 2020-09-08 DIAGNOSIS — L570 Actinic keratosis: Secondary | ICD-10-CM | POA: Diagnosis not present

## 2020-09-08 DIAGNOSIS — L578 Other skin changes due to chronic exposure to nonionizing radiation: Secondary | ICD-10-CM

## 2020-09-08 DIAGNOSIS — Z1283 Encounter for screening for malignant neoplasm of skin: Secondary | ICD-10-CM

## 2020-09-08 MED ORDER — HYDROCORTISONE 2.5 % EX CREA: TOPICAL_CREAM | Freq: Every day | CUTANEOUS | 11 refills | Status: AC

## 2020-09-08 MED ORDER — KETOCONAZOLE 2 % EX CREA: 1.0000 "application " | TOPICAL_CREAM | Freq: Every day | CUTANEOUS | 11 refills | Status: AC

## 2020-09-08 NOTE — Progress Notes (Signed)
Follow-Up Visit   Subjective  Duane Hughes is a 70 y.o. male who presents for the following: Annual Exam (History of SCC and AKs - TBSE today). The patient presents for Total-Body Skin Exam (TBSE) for skin cancer screening and mole check.  The following portions of the chart were reviewed this encounter and updated as appropriate:   Tobacco  Allergies  Meds  Problems  Med Hx  Surg Hx  Fam Hx     Review of Systems:  No other skin or systemic complaints except as noted in HPI or Assessment and Plan.  Objective  Well appearing patient in no apparent distress; mood and affect are within normal limits.  A full examination was performed including scalp, head, eyes, ears, nose, lips, neck, chest, axillae, abdomen, back, buttocks, bilateral upper extremities, bilateral lower extremities, hands, feet, fingers, toes, fingernails, and toenails. All findings within normal limits unless otherwise noted below.  Objective  Face/ears (11): Erythematous thin papules/macules with gritty scale.   Objective  Face: Mild scale   Assessment & Plan    Lentigines - Scattered tan macules - Due to sun exposure - Benign-appering, observe - Recommend daily broad spectrum sunscreen SPF 30+ to sun-exposed areas, reapply every 2 hours as needed. - Call for any changes  Seborrheic Keratoses - Stuck-on, waxy, tan-brown papules and/or plaques  - Benign-appearing - Discussed benign etiology and prognosis. - Observe - Call for any changes  Melanocytic Nevi - Tan-brown and/or pink-flesh-colored symmetric macules and papules - Benign appearing on exam today - Observation - Call clinic for new or changing moles - Recommend daily use of broad spectrum spf 30+ sunscreen to sun-exposed areas.   Hemangiomas - Red papules - Discussed benign nature - Observe - Call for any changes  Actinic Damage - Chronic condition, secondary to cumulative UV/sun exposure - diffuse scaly erythematous macules  with underlying dyspigmentation - Recommend daily broad spectrum sunscreen SPF 30+ to sun-exposed areas, reapply every 2 hours as needed.  - Staying in the shade or wearing long sleeves, sun glasses (UVA+UVB protection) and wide brim hats (4-inch brim around the entire circumference of the hat) are also recommended for sun protection.  - Call for new or changing lesions.  Skin cancer screening performed today.  History of Squamous Cell Carcinoma of the Skin - No evidence of recurrence today - No lymphadenopathy - Recommend regular full body skin exams - Recommend daily broad spectrum sunscreen SPF 30+ to sun-exposed areas, reapply every 2 hours as needed.  - Call if any new or changing lesions are noted between office visits  AK (actinic keratosis) (11) Face/ears RTC if lesion on right cheek is not resolved in 6 weeks. Destruction of lesion - Face/ears Complexity: simple   Destruction method: cryotherapy   Informed consent: discussed and consent obtained   Timeout:  patient name, date of birth, surgical site, and procedure verified Lesion destroyed using liquid nitrogen: Yes   Region frozen until ice ball extended beyond lesion: Yes   Outcome: patient tolerated procedure well with no complications   Post-procedure details: wound care instructions given    Seborrheic dermatitis Face Improved on treatment. Continue Ketoconazole 2% cream qd 3 times per week, HC 2.5% cream qd 3 days per week  Seborrheic Dermatitis  -  is a chronic persistent rash characterized by pinkness and scaling most commonly of the mid face but also can occur on the scalp (dandruff), ears; mid chest and mid back. It tends to be exacerbated by stress and  cooler weather.  People who have neurologic disease may experience new onset or exacerbation of existing seborrheic dermatitis.  The condition is not curable but treatable and can be controlled.  ketoconazole (NIZORAL) 2 % cream - Face hydrocortisone 2.5 % cream  - Face  Return in about 1 year (around 09/08/2021) for TBSE.  I, Ashok Cordia, CMA, am acting as scribe for Sarina Ser, MD .  Documentation: I have reviewed the above documentation for accuracy and completeness, and I agree with the above.  Sarina Ser, MD

## 2020-09-08 NOTE — Patient Instructions (Signed)

## 2020-09-14 ENCOUNTER — Encounter: Payer: Self-pay | Admitting: Dermatology

## 2020-09-16 ENCOUNTER — Other Ambulatory Visit: Payer: Self-pay

## 2020-09-16 ENCOUNTER — Encounter: Payer: Self-pay | Admitting: Family Medicine

## 2020-09-16 ENCOUNTER — Ambulatory Visit (INDEPENDENT_AMBULATORY_CARE_PROVIDER_SITE_OTHER): Payer: Medicare HMO | Admitting: Family Medicine

## 2020-09-16 VITALS — BP 126/72 | HR 82 | Temp 98.1°F | Resp 18 | Ht 69.0 in | Wt 264.9 lb

## 2020-09-16 DIAGNOSIS — G729 Myopathy, unspecified: Secondary | ICD-10-CM

## 2020-09-16 DIAGNOSIS — E785 Hyperlipidemia, unspecified: Secondary | ICD-10-CM

## 2020-09-16 DIAGNOSIS — E669 Obesity, unspecified: Secondary | ICD-10-CM

## 2020-09-16 DIAGNOSIS — G629 Polyneuropathy, unspecified: Secondary | ICD-10-CM

## 2020-09-16 DIAGNOSIS — M25519 Pain in unspecified shoulder: Secondary | ICD-10-CM

## 2020-09-16 DIAGNOSIS — I10 Essential (primary) hypertension: Secondary | ICD-10-CM

## 2020-09-16 DIAGNOSIS — E538 Deficiency of other specified B group vitamins: Secondary | ICD-10-CM

## 2020-09-16 DIAGNOSIS — N401 Enlarged prostate with lower urinary tract symptoms: Secondary | ICD-10-CM | POA: Diagnosis not present

## 2020-09-16 DIAGNOSIS — M2550 Pain in unspecified joint: Secondary | ICD-10-CM

## 2020-09-16 DIAGNOSIS — G5793 Unspecified mononeuropathy of bilateral lower limbs: Secondary | ICD-10-CM

## 2020-09-16 DIAGNOSIS — E611 Iron deficiency: Secondary | ICD-10-CM

## 2020-09-16 DIAGNOSIS — Z5181 Encounter for therapeutic drug level monitoring: Secondary | ICD-10-CM

## 2020-09-16 DIAGNOSIS — N138 Other obstructive and reflux uropathy: Secondary | ICD-10-CM

## 2020-09-16 DIAGNOSIS — E1159 Type 2 diabetes mellitus with other circulatory complications: Secondary | ICD-10-CM

## 2020-09-16 DIAGNOSIS — E1169 Type 2 diabetes mellitus with other specified complication: Secondary | ICD-10-CM | POA: Diagnosis not present

## 2020-09-16 DIAGNOSIS — I152 Hypertension secondary to endocrine disorders: Secondary | ICD-10-CM

## 2020-09-16 DIAGNOSIS — R29898 Other symptoms and signs involving the musculoskeletal system: Secondary | ICD-10-CM

## 2020-09-16 MED ORDER — DICLOFENAC SODIUM 1 % EX GEL
2.0000 g | Freq: Four times a day (QID) | CUTANEOUS | 5 refills | Status: DC | PRN
Start: 2020-09-16 — End: 2021-03-10

## 2020-09-16 MED ORDER — LISINOPRIL 5 MG PO TABS: 2.5000 mg | ORAL_TABLET | Freq: Every day | ORAL | 3 refills | Status: AC

## 2020-09-16 MED ORDER — ATORVASTATIN CALCIUM 10 MG PO TABS: 10.0000 mg | ORAL_TABLET | Freq: Every day | ORAL | 3 refills | Status: DC

## 2020-09-16 MED ORDER — BACLOFEN 10 MG PO TABS
5.0000 mg | ORAL_TABLET | Freq: Three times a day (TID) | ORAL | 0 refills | Status: DC | PRN
Start: 2020-09-16 — End: 2021-03-10

## 2020-09-16 MED ORDER — METFORMIN HCL 500 MG PO TABS: ORAL_TABLET | ORAL | 3 refills | Status: AC

## 2020-09-16 NOTE — Progress Notes (Signed)
Name: Duane Hughes   MRN: [id number]    DOB: Sep 08, 1950   Date:09/16/2020       Progress Note  Chief Complaint  Patient presents with  . Follow-up    Medication refill      Subjective:   Duane Hughes is a 70 y.o. male, presents to clinic for routine f/up Labs were done in Feb, but pt cancelled the immediate f/up appt on 2/10  Hypertension:  Currently managed on lisinopril 2.5 mg daily Pt reports good med compliance and denies any SE.   Blood pressure today is well controlled. BP Readings from Last 3 Encounters:  09/16/20 126/72  03/09/20 (!) 141/87  01/26/20 128/74   Pt denies CP, SOB, exertional sx, LE edema, palpitation, Ha's, visual disturbances, lightheadedness, hypotension, syncope.  Hyperlipidemia: Currently treated with Lipitor 10 mg daily, pt reports good med compliance -patient has had no problem restarting this medication including no myalgias or fatigue Last Lipids: Lab Results  Component Value Date   CHOL 142 06/07/2020   HDL 40 06/07/2020   LDLCALC 82 06/07/2020   TRIG 102 06/07/2020   CHOLHDL 3.6 06/07/2020   - Denies: Chest pain, shortness of breath, myalgias, claudication -he has chronic lower extremity weakness and neuropathy which is unchanged and he has previously had a vascular work-up which was negative for PAD   DM:   Pt managing DM with metformin 1000 mg in the morning and 500 mg in the evening Reports good med compliance Pt has no SE from meds. Blood sugars not checking Denies: Polyuria, polydipsia, vision changes, neuropathy, hypoglycemia Recent pertinent labs: Lab Results  Component Value Date   HGBA1C 6.9 (H) 06/07/2020   HGBA1C 6.3 (H) 09/04/2019   HGBA1C 6.5 (H) 03/31/2019   Standard of care and health maintenance: Foot exam: Refuses, due DM eye exam:  UTD ACEI/ARB:  yes Statin:  yes   Iron deficiency -on iron supplement 3 times a week he is tolerating this Mild B12 deficiency -not on a supplement he states that it  hurt his stomach last time he tried it  Last year had severe leg pain neurogenic claudication, improved after lumbar surgery - managed with neurosurgery at [facility] -patient endorses improved back pain but no difference to his leg symptoms including weakness, numbness and difficulty with gait, mobility and his balance  Mood/psych managed by specialists out of [place] on wellbutrin, celexa, lamictal, mirapex -he was getting depressive they did add on additional medications over the past 6 to 12 months   Intermittent pain - sometimes knee pain, sometimes in his shoulder he is here asking for refill on narcotic medication but he also earlier stated very emphatically that he has no pain. Patient cannot specifically say how often he has pain, how severe it is, or what seems to be causing the onset of his pain.  Sometimes it is in his knees or shoulder or hips.  He has tried tylenol, and ibuprofen don't work.  He states that before when he had pain medication whenever he had a problem where he felt some pain he would just take the oxycodone and everything would be fine.  He has not sought examination or therapy for his shoulder pain, he has bilateral knee replacement. Last pain medication prescription was from a specialist and all medications except 1 on his controlled substance database were from orthopedic or neurosurgery specialist.  I had provided 1 prescription around the time of his surgery last year     Current Outpatient Medications:  .  aspirin EC 81 MG tablet, Take 81 mg by mouth daily. Swallow whole., Disp: , Rfl:  .  atorvastatin (LIPITOR) 10 MG tablet, Take 1 tablet (10 mg total) by mouth at bedtime. (Patient taking differently: Take 20 mg by mouth every other day.), Disp: 90 tablet, Rfl: 3 .  buPROPion (WELLBUTRIN SR) 150 MG 12 hr tablet, TAKE 1 TABLET (150 MG TOTAL) BY MOUTH 2 (TWO) TIMES DAILY., Disp: 180 tablet, Rfl: 3 .  Cholecalciferol (VITAMIN D) 50 MCG (2000 UT) tablet, Take 2,000  Units by mouth daily., Disp: , Rfl:  .  citalopram (CELEXA) 20 MG tablet, Take 1/2 tablet daily for one week, then increase to 1 tablet daily, Disp: 30 tablet, Rfl: 2 .  diphenhydramine-acetaminophen (TYLENOL PM) 25-500 MG TABS tablet, Take 1 tablet by mouth at bedtime as needed., Disp: , Rfl:  .  ferrous sulfate 325 (65 FE) MG EC tablet, Take 1 tablet (325 mg total) by mouth 3 (three) times a week. (Patient taking differently: Take 325 mg by mouth 3 days.), Disp: 36 tablet, Rfl: 3 .  hydrocortisone 2.5 % cream, Apply topically daily. 3 times per week, Disp: 30 g, Rfl: 11 .  ketoconazole (NIZORAL) 2 % cream, Apply 1 application topically daily. 3 times per week, Disp: 60 g, Rfl: 11 .  lamoTRIgine (LAMICTAL) 200 MG tablet, Take 1 tablet (200 mg total) by mouth daily., Disp: 90 tablet, Rfl: 3 .  lisinopril (ZESTRIL) 5 MG tablet, Take 0.5 tablets (2.5 mg total) by mouth daily., Disp: 45 tablet, Rfl: 0 .  metFORMIN (GLUCOPHAGE) 500 MG tablet, Take 2 tablets with breakfast and 1 tablet with dinner daily. (Patient taking differently: Take 500-1,000 mg by mouth See admin instructions. Take 1000 mg in the morning and 500 mg in the afternoon), Disp: 270 tablet, Rfl: 3 .  naproxen (NAPROSYN) 250 MG tablet, Take 250 mg by mouth daily with breakfast., Disp: , Rfl:  .  Omega-3 Fatty Acids (FISH OIL) 1000 MG CAPS, Take 1,000 mg by mouth daily. , Disp: , Rfl:  .  oxyCODONE-acetaminophen (PERCOCET/ROXICET) 5-325 MG tablet, Take 1 tablet by mouth every 8 (eight) hours as needed for severe pain., Disp: , Rfl:  .  pramipexole (MIRAPEX) 0.5 MG tablet, Take 1 tablet (0.5 mg total) by mouth 2 (two) times daily., Disp: 180 tablet, Rfl: 3 .  sildenafil (REVATIO) 20 MG tablet, TAKE 3-5 TABLETS 2 HOURS BEFORE INTERCOURSE ON AN EMPTY STOMACH DO NOT TAKE WITH NITRATES, Disp: 50 tablet, Rfl: 3  Patient Active Problem List   Diagnosis Date Noted  . Spinal stenosis of lumbosacral region 11/04/2019  . Myopathy, unspecified  10/21/2019  . Lymphedema 10/16/2019  . Leg weakness, bilateral 10/08/2019  . Positive ANA (antinuclear antibody) 10/08/2019  . Diabetic polyneuropathy associated with type 2 diabetes mellitus ([place]) 03/05/2019  . Paronychia of great toe of right foot 01/30/2019  . Status post total left knee replacement 09/24/2018  . Abdominal aortic aneurysm ([place]) 09/24/2018  . Peripheral neuropathy 10/29/2017  . Hx of total knee arthroplasty, right 04/04/2017  . LAE (left atrial enlargement) 03/08/2017  . Right atrial enlargement 03/08/2017  . Vitamin D deficiency 11/21/2016  . Hx of hypogonadism 11/17/2016  . Benign neoplasm of ascending colon   . Bilateral low back pain with bilateral sciatica 03/26/2016  . 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 Dyslipidemia 06/22/2015  . Diabetes mellitus type 2 in obese ([place]) 03/19/2015  . Obstructive sleep apnea 03/18/2015  . Morbid obesity ([place]) 11/11/2014  .  Major depressive disorder, recurrent ([place]) 11/11/2014  . Benign hypertension 11/11/2014    Past Surgical History:  Procedure Laterality Date  . COLONOSCOPY WITH PROPOFOL N/A 05/09/2016   Procedure: COLONOSCOPY WITH PROPOFOL;  Surgeon: Lucilla Lame, MD;  Location: [facility] ENDOSCOPY;  Service: Endoscopy;  Laterality: N/A;  . HERNIA REPAIR     [place]  . JOINT REPLACEMENT     04-04-17 Dr. Gladstone Lighter   Right knee  . KNEE ARTHROSCOPY     several scopes done  . KNEE CLOSED REDUCTION Right 05/31/2017   Procedure: CLOSED MANIPULATION RIGHT KNEE;  Surgeon: Latanya Maudlin, MD;  Location: [facility];  Service: Orthopedics;  Laterality: Right;  Femoral Block  . LUMBAR LAMINECTOMY/DECOMPRESSION MICRODISCECTOMY N/A 01/26/2020   Procedure: L3, L4 LAMINECTOMIES;  Surgeon: Deetta Perla, MD;  Location: [facility] ORS;  Service: Neurosurgery;  Laterality: N/A;  . MEDIAL PARTIAL KNEE REPLACEMENT Left    2000s?  Marland Kitchen SKIN CANCER EXCISION  Feb 2017   removed from right hand  . TOTAL KNEE ARTHROPLASTY Right 04/04/2017    Procedure: RIGHT TOTAL KNEE ARTHROPLASTY;  Surgeon: Latanya Maudlin, MD;  Location: [facility];  Service: Orthopedics;  Laterality: Right;  . TOTAL KNEE ARTHROPLASTY Left 09/24/2018   Procedure: REVISION LEFT KNEE ARTHROPLASTY;  Surgeon: Paralee Cancel, MD;  Location: [facility];  Service: Orthopedics;  Laterality: Left;  70 mins    Family History  Problem Relation Age of Onset  . Alcohol abuse Mother   . Rheum arthritis Mother   . Stroke Maternal Grandmother   . Stroke Paternal Grandfather   . Dementia Paternal Grandmother   . Cancer Neg Hx   . Diabetes Neg Hx   . Heart disease Neg Hx   . Hypertension Neg Hx   . Kidney disease Neg Hx   . Prostate cancer Neg Hx     Social History   Tobacco Use  . Smoking status: Never Smoker  . Smokeless tobacco: Never Used  Vaping Use  . Vaping Use: Never used  Substance Use Topics  . Alcohol use: No    Alcohol/week: 0.0 standard drinks  . Drug use: No     No Known Allergies  Health Maintenance  Topic Date Due  . FOOT EXAM  08/31/2020  . COVID-19 Vaccine (1) 09/28/2020 (Originally 11/27/1962)  . INFLUENZA VACCINE  11/29/2020  . HEMOGLOBIN A1C  12/05/2020  . OPHTHALMOLOGY EXAM  12/31/2020  . COLONOSCOPY (Pts 45-94yrs Insurance coverage will need to be confirmed)  05/09/2021  . TETANUS/TDAP  03/01/2028  . Hepatitis C Screening  Completed  . PNA vac Low Risk Adult  Completed  . HPV VACCINES  Aged Out    Chart Review Today: I personally reviewed active problem list, medication list, allergies, family history, social history, health maintenance, notes from last encounter, lab results, imaging with the patient/caregiver today.   Review of Systems  All other Systems reviewed and are negative for acute change except as noted in the HPI.  Objective:   Vitals:   09/16/20 1134  BP: 126/72  Pulse: 82  Resp: 18  Temp: 98.1 F (36.7 C)  TempSrc: Oral  SpO2: 99%  Weight: 264 lb 14.4 oz (120.2 kg)  Height: 5\' 9"  (1.753 m)    Body mass  index is 39.12 kg/m.  Physical Exam Vitals and nursing note reviewed.  Constitutional:      General: He is not in acute distress.    Appearance: Normal appearance. He is well-developed and well-groomed. He is obese. He is not ill-appearing, toxic-appearing or  diaphoretic.     Interventions: Face mask in place.  HENT:     Head: Normocephalic and atraumatic.  Eyes:     General:        Right eye: No discharge.        Left eye: No discharge.     Conjunctiva/sclera: Conjunctivae normal.  Cardiovascular:     Rate and Rhythm: Normal rate and regular rhythm.     Pulses: Normal pulses.     Heart sounds: Normal heart sounds. No murmur heard. No friction rub. No gallop.   Pulmonary:     Effort: Pulmonary effort is normal. No respiratory distress.     Breath sounds: Normal breath sounds. No stridor. No wheezing or rales.  Abdominal:     General: Bowel sounds are normal.     Palpations: Abdomen is soft.     Comments: Obese abd, NTND, NBSx4  Skin:    Coloration: Skin is not jaundiced or pale.  Neurological:     Mental Status: He is alert.  Psychiatric:        Attention and Perception: Attention normal.        Mood and Affect: Mood is depressed. Affect is flat.        Speech: Speech normal.        Behavior: Behavior normal.         Assessment & Plan:   1. Dyslipidemia Compliant with meds, no SE, no myalgias, fatigue or jaundice Reviewed his recent labs, lipids at goal - atorvastatin (LIPITOR) 10 MG tablet; Take 1 tablet (10 mg total) by mouth at bedtime.  Dispense: 90 tablet; Refill: 3 - COMPLETE METABOLIC PANEL WITH GFR  2. Benign hypertension Stable, well-controlled, blood pressure at goal today, continue lisinopril 2.5 mg daily for renal protection - lisinopril (ZESTRIL) 5 MG tablet; Take 0.5 tablets (2.5 mg total) by mouth daily.  Dispense: 45 tablet; Refill: 3 - COMPLETE METABOLIC PANEL WITH GFR  3. Diabetes mellitus type 2 in obese ([facility]) Last A1c was at goal Lab  Results  Component Value Date   HGBA1C 6.9 (H) 06/07/2020   Continue metformin, statin, ACEI Due for foot exam unfortunately not done today due to pt depressed and angry mood and affect - metFORMIN (GLUCOPHAGE) 500 MG tablet; Take 2 tablets with breakfast and 1 tablet with dinner daily.  Dispense: 270 tablet; Refill: 3 - Hemoglobin A1C - COMPLETE METABOLIC PANEL WITH GFR  4. Hypertension associated with type 2 diabetes mellitus ([place]) See above - COMPLETE METABOLIC PANEL WITH GFR  5. Peripheral polyneuropathy Did encourage him to follow-up with neurology patient has seen several specialist in the past for this he continues to complain of weakness and bothersome symptoms including numbness and balance loss Encouraged physical medicine and rehab assessment and management or neuro PT evaluation or going back to neurology I have also suggested that he supplement B12 and iron and in the next 3 months we will recheck May want to try Lyrica or gabapentin but patient is not sure if he is tried this before - CBC with Differential/Platelet - COMPLETE METABOLIC PANEL WITH GFR - Vitamin B12 - Iron, TIBC and Ferritin Panel  6. Vitamin B12 deficiency Encouraged B complex supplement or B12 supplement -patient does not believe that he can take this he states it upsets his stomach -encouraged him to look at foods high in B12 and incorporate them into his diet - CBC with Differential/Platelet - Vitamin B12  7. Iron deficiency Still taking iron supplement, recheck labs in about  3 months - CBC with Differential/Platelet - Iron, TIBC and Ferritin Panel  8. Myopathy, unspecified Per neuro/neurosurgery  9. BPH with obstruction/lower urinary tract symptoms Per urology - sx well controlled  10. Morbid obesity ([place]) Wt Readings from Last 5 Encounters:  09/16/20 264 lb 14.4 oz (120.2 kg)  03/09/20 264 lb (119.7 kg)  01/26/20 255 lb (115.7 kg)  12/10/19 261 lb (118.4 kg)  11/04/19 259 lb 11.2 oz  (117.8 kg)   BMI Readings from Last 5 Encounters:  09/16/20 39.12 kg/m  03/09/20 38.99 kg/m  01/26/20 37.66 kg/m  12/10/19 38.54 kg/m  11/04/19 38.35 kg/m   Weight stable -his various joint pain may improve with some weight loss   11. Encounter for medication monitoring - Hemoglobin A1C - CBC with Differential/Platelet - COMPLETE METABOLIC PANEL WITH GFR - Vitamin B12 - Iron, TIBC and Ferritin Panel   I have explained to the patient that I do not feel it is appropriate to prescribe narcotics for intermittent various joint pains that he cannot help me understand what is causing and he has not come in for evaluation.  I explained that with his shoulder pain he should likely have a PT evaluation possibly Ortho evaluate, encouraged him to use over-the-counter medication such as naproxen twice daily and Tylenol arthritis, gave him muscle relaxers and prescribed diclofenac gel.  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   Delsa Grana, PA-C 09/16/20 11:43 AM

## 2020-09-16 NOTE — Patient Instructions (Addendum)
Try a B12 supplement if you think you can tolerate that - or a B-complex vitamin Continue the iron supplement until we recheck your labs again in 3 months  Try naproxen twice a day and tylenol arthritis up to three times a day for pain You can also try baclofen as needed  I will try to see if there are other medicines that are safe for you to try for various kinds of pain  I recommend you see physical therapy and/or physical medicine and rehab MD for further evaluation and treatment of your pain.     Last lab notes: Calcium mildly low A1C 6.9 - at goal and well controlled Cholesterol has improved Kidney/liver function good, other electrolytes normal Vit D - near normal - good Some mild iron deficiency  B12 low end of normal CBC - normal no anemia, normal RBC size etc Will review with pt at appt tomorrow

## 2020-10-08 ENCOUNTER — Telehealth: Payer: Self-pay

## 2020-10-08 NOTE — Chronic Care Management (AMB) (Signed)
Chronic Care Management Pharmacy Assistant   Name: Duane Hughes  MRN: [id number] DOB: 1951-03-06  Reason for Encounter: Hypertension and HLD Disease State Call   Conditions to be addressed/monitored: HTN and HLD  Recent office visits: 09/16/2020 Delsa Grana, PA-C (PCP)- for a follow-up- Try a B12 supplement if you think you can tolerate that - or a B-complex vitamin Continue the iron supplement until we recheck your labs again in 3 months, Started Baclofen 10 mg take 1/2 to 1 tablet daily PRN, Started: Diclofenac Sodium 2 g Topical 4 X daily prn  Recent consult visits:  09/08/2020 Sarina Ser, MD (Dermatology) - Continue Ketoconazole 2% cream qd 3 times per week, HC 2.5% cream qd 3 days per week Start Hydrocortisone 2.5% Topical Daily 3 X per week 09/03/2020 Thayer Headings, PMHNP (Behavioral Health)- For Depression - PT will start Celexa 10 mg po qd for one week, then increase to 20 mg daily for mood s/s., Continue Wellbutrin SR 150 mg po BID for depression, Continue Lamictal 200 mg po qd for mood stabilization.Continue Pramipexole 0.5 mg po BID for off label indication for depression. Pt to follow-up in 6 weeks or sooner if clinically indicated.  07/22/2020 Thayer Headings, PMHNP [facility])-  Patient agrees to trial of sertraline.  Will start sertraline 50 mg daily for 1 week, then increase to 100 mg daily to target mood signs and symptoms. Discussed continuing other medications without changes until response to sertraline is known. Continue bupropion SR 150 mg twice daily for depression. Continue lamotrigine 200 mg daily for mood signs and symptoms. Continue pramipexole 0.5 mg twice daily for off label indication for depression. Patient to follow-up in 4 to 6 weeks or sooner if clinically indicated.  Hospital visits:  None in previous 6 months  Medications: Outpatient Encounter Medications as of 10/08/2020  Medication Sig Note   aspirin EC 81 MG tablet Take 81 mg by mouth  daily. Swallow whole.    atorvastatin (LIPITOR) 10 MG tablet Take 1 tablet (10 mg total) by mouth at bedtime.    baclofen (LIORESAL) 10 MG tablet Take 0.5-1 tablets (5-10 mg total) by mouth 3 (three) times daily as needed for muscle spasms.    buPROPion (WELLBUTRIN SR) 150 MG 12 hr tablet TAKE 1 TABLET (150 MG TOTAL) BY MOUTH 2 (TWO) TIMES DAILY.    Cholecalciferol (VITAMIN D) 50 MCG (2000 UT) tablet Take 2,000 Units by mouth daily.    citalopram (CELEXA) 20 MG tablet Take 1/2 tablet daily for one week, then increase to 1 tablet daily    diclofenac Sodium (VOLTAREN) 1 % GEL Apply 2 g topically 4 (four) times daily as needed (for chronic pain management).    diphenhydramine-acetaminophen (TYLENOL PM) 25-500 MG TABS tablet Take 1 tablet by mouth at bedtime as needed.    ferrous sulfate 325 (65 FE) MG EC tablet Take 1 tablet (325 mg total) by mouth 3 (three) times a week. (Patient taking differently: Take 325 mg by mouth 3 days.)    hydrocortisone 2.5 % cream Apply topically daily. 3 times per week    ketoconazole (NIZORAL) 2 % cream Apply 1 application topically daily. 3 times per week    lamoTRIgine (LAMICTAL) 200 MG tablet Take 1 tablet (200 mg total) by mouth daily.    lisinopril (ZESTRIL) 5 MG tablet Take 0.5 tablets (2.5 mg total) by mouth daily.    metFORMIN (GLUCOPHAGE) 500 MG tablet Take 2 tablets with breakfast and 1 tablet with dinner daily.  naproxen (NAPROSYN) 250 MG tablet Take 250 mg by mouth daily with breakfast.    Omega-3 Fatty Acids (FISH OIL) 1000 MG CAPS Take 1,000 mg by mouth daily.     oxyCODONE-acetaminophen (PERCOCET/ROXICET) 5-325 MG tablet Take 1 tablet by mouth every 8 (eight) hours as needed for severe pain. 07/14/2020: Received supply following back surgery   pramipexole (MIRAPEX) 0.5 MG tablet Take 1 tablet (0.5 mg total) by mouth 2 (two) times daily.    sildenafil (REVATIO) 20 MG tablet TAKE 3-5 TABLETS 2 HOURS BEFORE INTERCOURSE ON AN EMPTY STOMACH DO NOT TAKE WITH  NITRATES    No facility-administered encounter medications on file as of 10/08/2020.   Star Rating Drugs: Atorvastatin 10 mg last filled 03/02/2020 for a 90-Day supply at [facility] (ask about refill) Lisinopril 5 mg last filled 08/26/2020 for a 90-Day supply at [facility]  Metformin 500 mg last filled 07/02/2020 a 37- Day supply at [facility]  Reviewed chart prior to disease state call. Spoke with patient regarding BP  Recent Office Vitals: BP Readings from Last 3 Encounters:  09/16/20 126/72  03/09/20 (!) 141/87  01/26/20 128/74   Pulse Readings from Last 3 Encounters:  09/16/20 82  03/09/20 83  01/26/20 66    Wt Readings from Last 3 Encounters:  09/16/20 264 lb 14.4 oz (120.2 kg)  03/09/20 264 lb (119.7 kg)  01/26/20 255 lb (115.7 kg)     Kidney Function Lab Results  Component Value Date/Time   CREATININE 0.90 06/07/2020 08:12 AM   CREATININE 0.87 01/22/2020 11:48 AM   CREATININE 0.98 11/04/2019 03:26 PM   GFRNONAA 86 06/07/2020 08:12 AM   GFRAA 100 06/07/2020 08:12 AM    BMP Latest Ref Rng & Units 06/07/2020 01/22/2020 11/04/2019  Glucose 65 - 99 mg/dL 105(H) 120(H) 148(H)  BUN 7 - 25 mg/dL 15 17 15   Creatinine 0.70 - 1.18 mg/dL 0.90 0.87 0.98  BUN/Creat Ratio 6 - 22 (calc) NOT APPLICABLE - NOT APPLICABLE  Sodium 979 - 146 mmol/L 140 137 141  Potassium 3.5 - 5.3 mmol/L 4.3 4.3 4.0  Chloride 98 - 110 mmol/L 108 104 108  CO2 20 - 32 mmol/L 23 23 24   Calcium 8.6 - 10.3 mg/dL 8.4(L) 8.4(L) 8.8    Current antihypertensive regimen:  Lisinopril 5 mg 1/2 tablet daily  How often are you checking your Blood Pressure? 1-2x per week when he donates plasma on Tuesday and Thursday they check his blood pressure prior to him donating.  Current home BP readings: last reading was 145/70 that was last Thursday  What recent interventions/DTPs have been made by any provider to improve Blood Pressure control since last CPP Visit: None  Any recent hospitalizations  or ED visits since last visit with CPP? No  What diet changes have been made to improve Blood Pressure Control?  The patient reports that he works a lot so he eat's out often. He states during eating out he tries to get a salad here and there but he doesn't really watch what he  eats.  What exercise is being done to improve your Blood Pressure Control?  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 He does report having neuropathy pains and denies taking anything for the pain at this time.  Adherence Review: Is the patient currently on ACE/ARB medication? No  Does the patient have >5 day gap between last estimated fill dates? No   Lipid Panel  Component Value Date/Time   CHOL 142 06/07/2020 0812   CHOL 142 09/02/2015 1142   TRIG 102 06/07/2020 0812   HDL 40 06/07/2020 0812   HDL 43 09/02/2015 1142   LDLCALC 82 06/07/2020 0812    10-year ASCVD risk score: The 10-year ASCVD risk score Mikey Bussing DC Jr., et al., 2013) is: 33.4%   Values used to calculate the score:     Age: 10 years     Sex: Male     Is Non-Hispanic African American: No     Diabetic: Yes     Tobacco smoker: No     Systolic Blood Pressure: 924 mmHg     Is BP treated: Yes     HDL Cholesterol: 40 mg/dL     Total Cholesterol: 142 mg/dL  Current antihyperlipidemic regimen:  Atorvastatin 10 mg   Previous antihyperlipidemic medications tried: None  ASCVD risk enhancing conditions: age >50, DM, and HTN  What recent interventions/DTPs have been made by any provider to improve Cholesterol control since last CPP Visit: None  Any recent hospitalizations or ED visits since last visit with CPP? No  What diet changes have been made to improve Cholesterol?  The patient reports that he works a lot so he eat's out often. He states during eating out he tries to get a salad here and there but he doesn't really watch what he  eats.  What exercise is being done to improve Cholesterol?   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 He does report having neuropathy pains and denies taking anything for the pain at this time.  Adherence Review: Does the patient have >5 day gap between last estimated fill dates? Yes Atorvastatin 10 mg  last filled 03/02/2020 pt reported to CPP on last visit he received and extra supply of 20 mg from his sister.  Patient stated that at this time he still has Atorvastatin and does not need a refill. Patient stated he has an appointment to get his labs done next week to check his cholesterol levels.  Patient has a follow-up appointment with Junius Argyle, CPP on 01/13/2021 @ [place], [place] [employer] manager [phone]

## 2020-10-12 ENCOUNTER — Other Ambulatory Visit: Payer: Self-pay

## 2020-10-12 DIAGNOSIS — E611 Iron deficiency: Secondary | ICD-10-CM

## 2020-10-12 MED ORDER — FERROUS SULFATE 325 (65 FE) MG PO TBEC: 325.0000 mg | DELAYED_RELEASE_TABLET | ORAL | 3 refills | Status: DC

## 2020-10-14 ENCOUNTER — Ambulatory Visit: Payer: Medicare HMO | Admitting: Podiatry

## 2020-10-18 ENCOUNTER — Encounter: Payer: Self-pay | Admitting: Psychiatry

## 2020-10-18 ENCOUNTER — Other Ambulatory Visit: Payer: Self-pay

## 2020-10-18 ENCOUNTER — Ambulatory Visit: Payer: Medicare HMO | Admitting: Psychiatry

## 2020-10-18 DIAGNOSIS — F32A Depression, unspecified: Secondary | ICD-10-CM

## 2020-10-18 DIAGNOSIS — R69 Illness, unspecified: Secondary | ICD-10-CM | POA: Diagnosis not present

## 2020-10-18 DIAGNOSIS — F329 Major depressive disorder, single episode, unspecified: Secondary | ICD-10-CM

## 2020-10-18 MED ORDER — CITALOPRAM HYDROBROMIDE 20 MG PO TABS: ORAL_TABLET | ORAL | 2 refills | Status: DC

## 2020-10-18 MED ORDER — VORTIOXETINE HBR 10 MG PO TABS: 10.0000 mg | ORAL_TABLET | Freq: Every day | ORAL | 1 refills | Status: DC

## 2020-10-18 NOTE — Progress Notes (Signed)
Duane Hughes [id number] August 27, 1950 70 y.o.  Subjective:   Patient ID:  Duane Hughes is a 70 y.o. (DOB [birth year]) male.  Chief Complaint:  Chief Complaint  Patient presents with   Depression   Other    Irritability    HPI Duane Hughes presents to the office today for follow-up of depression. He reports worsening depression. He denies persistent sad mood. He reports worsening neuropathy and that this causes worry about the future and his mobility. He reports, "the anger is coming back." Denies anxiety. He reports that his motivation is low and will get up "and just sit there." Denies persistent fatigue. He reports that he is likely not getting enough sleep. Some nights is working until 10:30-11 pm and may not get home until 12:30-1 am. He reports that some mornings he awakens at 6 am to golf or donate plasma. He reports difficulty sleeping past 7 am. No change in appetite. He reports, "I'm not as sharp as I used to be." He reports that he got a new phone and none of his notes or passwords transferred. He reports that he has been having difficulty keeping up with his passwords and login. Denies anhedonia. He reports feelings of worthlessness. Denies SI.   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   One of his sons that lives in [place] will be coming to the [employer] for work and he will be able to see him. He will be able to his oldest son and his family in August.    Past Psychiatric Medication Trials: Pristiq-ineffective Effexor-effective for 2 years then no longer effective Bupropion-taken for years Cymbalta-helped for 2 years but no longer effective Sertraline- Not effective. Loose stools. Celexa Abilify-not effective at 5 mg and increased emotional lability at 10 mg Lamictal Lithium Pramipexole- effective for mood signs and symptoms Trazodone    GAD-7    Flowsheet Row Office Visit from 09/16/2020 in [facility] Office Visit from  11/04/2019 in [facility]  Total GAD-7 Score 1 0      PHQ2-9    [place] Office Visit from 09/16/2020 in [place] from 05/20/2020 in [facility] Office Visit from 11/04/2019 in [facility] Office Visit from 09/18/2019 in [facility] Office Visit from 09/04/2019 in [facility]  PHQ-2 Total Score 1 0 0 0 0  PHQ-9 Total Score 3 -- 0 0 0        Review of Systems:  Review of Systems  Musculoskeletal:  Negative for gait problem.  Allergic/Immunologic: Positive for environmental allergies.  Neurological:        Worsening neuropathy and balance  Psychiatric/Behavioral:         Please refer to HPI   Medications: I have reviewed the patient's current medications.  Current Outpatient Medications  Medication Sig Dispense Refill   vortioxetine HBr (TRINTELLIX) 10 MG TABS tablet Take 1 tablet (10 mg total) by mouth daily. 30 tablet 1   aspirin EC 81 MG tablet Take 81 mg by mouth daily. Swallow whole.     atorvastatin (LIPITOR) 10 MG tablet Take 1 tablet (10 mg total) by mouth at bedtime. 90 tablet 3   baclofen (LIORESAL) 10 MG tablet Take 0.5-1 tablets (5-10 mg total) by mouth 3 (three) times daily as needed for muscle spasms. 30 each 0   buPROPion (WELLBUTRIN SR) 150 MG 12 hr tablet TAKE 1 TABLET (150  MG TOTAL) BY MOUTH 2 (TWO) TIMES DAILY. 180 tablet 3   Cholecalciferol (VITAMIN D) 50 MCG (2000 UT) tablet Take 2,000 Units by mouth daily.     citalopram (CELEXA) 20 MG tablet Take 1/2 tablet daily for one week, then stop 30 tablet 2   diclofenac Sodium (VOLTAREN) 1 % GEL Apply 2 g topically 4 (four) times daily as needed (for chronic pain management). 100 g 5   diphenhydramine-acetaminophen (TYLENOL PM) 25-500 MG TABS tablet Take 1 tablet by mouth at bedtime as needed.     ferrous sulfate 325 (65 FE) MG EC tablet Take 1 tablet (325 mg total) by mouth 3 (three)  times a week. 36 tablet 3   hydrocortisone 2.5 % cream Apply topically daily. 3 times per week 30 g 11   ketoconazole (NIZORAL) 2 % cream Apply 1 application topically daily. 3 times per week 60 g 11   lamoTRIgine (LAMICTAL) 200 MG tablet Take 1 tablet (200 mg total) by mouth daily. 90 tablet 3   lisinopril (ZESTRIL) 5 MG tablet Take 0.5 tablets (2.5 mg total) by mouth daily. 45 tablet 3   metFORMIN (GLUCOPHAGE) 500 MG tablet Take 2 tablets with breakfast and 1 tablet with dinner daily. 270 tablet 3   naproxen (NAPROSYN) 250 MG tablet Take 250 mg by mouth daily with breakfast.     Omega-3 Fatty Acids (FISH OIL) 1000 MG CAPS Take 1,000 mg by mouth daily.      oxyCODONE-acetaminophen (PERCOCET/ROXICET) 5-325 MG tablet Take 1 tablet by mouth every 8 (eight) hours as needed for severe pain.     pramipexole (MIRAPEX) 0.5 MG tablet Take 1 tablet (0.5 mg total) by mouth 2 (two) times daily. 180 tablet 3   sildenafil (REVATIO) 20 MG tablet TAKE 3-5 TABLETS 2 HOURS BEFORE INTERCOURSE ON AN EMPTY STOMACH DO NOT TAKE WITH NITRATES 50 tablet 3   No current facility-administered medications for this visit.    Medication Side Effects: Other: Sexual side effects, loose stools.   Allergies: No Known Allergies  Past Medical History:  Diagnosis Date   Anemia    Anxiety    Dr.Zaniah Titterington . well controlled   Arthritis    back, hands, knees   Colon polyp 05/09/2016   TUBULAR ADENOMAS    Depression    Diabetes mellitus without complication ([place])    type 2   Ectatic abdominal aorta ([place]) 04/18/2016   2.9 cm on [place] 2016, unchanged 2017   History of hiatal hernia    20 years ago   History of skin cancer    hand   Hyperlipidemia    Hypertension 2015   Dr. Sanda Klein   Morbid obesity ([place]) 11/11/2014   Neuropathy    Obesity 11/11/2014   Sleep apnea    cpap   Squamous cell carcinoma of skin 04/11/2018   SCC In Situ R dorsum wrist   Squamous cell carcinoma of skin 06/23/2015   R dorsum hand    Past  Medical History, Surgical history, Social history, and Family history were reviewed and updated as appropriate.   Please see review of systems for further details on the patient's review from today.   Objective:   Physical Exam:  There were no vitals taken for this visit.  Physical Exam Constitutional:      General: He is not in acute distress. Musculoskeletal:        General: No deformity.  Neurological:     Mental Status: He is alert and oriented to person,  place, and time.     Coordination: Coordination normal.  Psychiatric:        Attention and Perception: Attention and perception normal. He does not perceive auditory or visual hallucinations.        Mood and Affect: Mood is depressed. Mood is not anxious. Affect is not labile, blunt, angry or inappropriate.        Speech: Speech normal.        Behavior: Behavior normal. Behavior is cooperative.        Thought Content: Thought content normal. Thought content is not paranoid or delusional. Thought content does not include homicidal or suicidal ideation. Thought content does not include homicidal or suicidal plan.        Cognition and Memory: Cognition and memory normal.        Judgment: Judgment normal.     Comments: Insight intact    Lab Review:     Component Value Date/Time   NA 140 06/07/2020 0812   NA 139 05/12/2015 1537   K 4.3 06/07/2020 0812   CL 108 06/07/2020 0812   CO2 23 06/07/2020 0812   GLUCOSE 105 (H) 06/07/2020 0812   BUN 15 06/07/2020 0812   BUN 15 05/12/2015 1537   CREATININE 0.90 06/07/2020 0812   CALCIUM 8.4 (L) 06/07/2020 0812   PROT 5.9 (L) 06/07/2020 0812   PROT 6.2 03/19/2015 0825   ALBUMIN 4.1 09/19/2016 1400   ALBUMIN 4.0 03/19/2015 0825   AST 26 06/07/2020 0812   ALT 30 06/07/2020 0812   ALKPHOS 46 09/19/2016 1400   BILITOT 0.2 06/07/2020 0812   BILITOT 0.5 03/19/2015 0825   GFRNONAA 86 06/07/2020 0812   GFRAA 100 06/07/2020 0812       Component Value Date/Time   WBC 5.2 06/07/2020  0812   RBC 4.63 06/07/2020 0812   HGB 14.5 06/07/2020 0812   HGB 14.8 06/20/2016 1458   HCT 42.6 06/07/2020 0812   HCT 43.6 06/20/2016 1458   PLT 250 06/07/2020 0812   PLT 260 06/20/2016 1458   MCV 92.0 06/07/2020 0812   MCV 93 06/20/2016 1458   MCH 31.3 06/07/2020 0812   MCHC 34.0 06/07/2020 0812   RDW 13.6 06/07/2020 0812   RDW 13.3 06/20/2016 1458   LYMPHSABS 1,404 06/07/2020 0812   LYMPHSABS 2.0 06/20/2016 1458   MONOABS 0.4 03/29/2017 1343   EOSABS 312 06/07/2020 0812   EOSABS 0.4 06/20/2016 1458   BASOSABS 52 06/07/2020 0812   BASOSABS 0.1 06/20/2016 1458    No results found for: POCLITH, LITHIUM   No results found for: PHENYTOIN, PHENOBARB, VALPROATE, CBMZ   .res Assessment: Plan:    Patient seen for 30 minutes and time spent discussing treatment plan and recommendation to switch citalopram to a different medication since he has continued to have worsening depression since switching to citalopram and is reporting multiple side effects.  Discussed potential benefits, risks, and side effects of Trintellix.  Discussed that Trintellix has additional mechanisms of action compared to an SSRI and may be more likely to improve mood signs and symptoms.  Discussed that Trintellix also has indication to improve cognitive processing speed and has low risk of sexual side effects.  Patient is agreeable to starting trial of Trintellix.  Patient provided with samples of Trintellix and prescription was also sent to his pharmacy to determine coverage.  Plan is to start Trintellix 5 mg daily for 1 week, then increase to 2 mg daily for depression if medication is not cost prohibitive. Will  decrease citalopram 20 mg to 1/2 tablet daily for 1 week, then stop due to side effects. Continue Wellbutrin SR 150 mg twice daily for depression. Continue lamotrigine 200 mg daily for mood signs and symptoms. Patient to follow-up in approximately 6 weeks or sooner if clinically indicated. Patient advised to  contact office with any questions, adverse effects, or acute worsening in signs and symptoms.   Camdan was seen today for depression and other.  Diagnoses and all orders for this visit:  Chronic depressive disorder -     vortioxetine HBr (TRINTELLIX) 10 MG TABS tablet; Take 1 tablet (10 mg total) by mouth daily. -     citalopram (CELEXA) 20 MG tablet; Take 1/2 tablet daily for one week, then stop    Please see After Visit Summary for patient specific instructions.  Future Appointments  Date Time Provider [place]  10/27/2020  7:30 AM [facility] VASC 2 [facility] None  10/27/2020  8:45 AM Kris Hartmann, NP [facility] None  11/29/2020 12:45 PM Thayer Headings, PMHNP CP-CP None  12/17/2020  1:40 PM Delsa Grana, PA-C [place] [facility]  01/13/2021  1:00 PM [place] [facility]  01/20/2021  1:45 PM Felipa Furnace, DPM [facility]  02/03/2021  3:30 PM Thayer Headings, PMHNP CP-CP None  03/03/2021  3:00 PM [facility] None  03/10/2021  3:00 PM McGowan, Hunt Oris, PA-C [facility] None  05/24/2021  2:50 PM [place] ADVISOR [place] [facility]  09/14/2021  3:45 PM Ralene Bathe, MD [facility] None    No orders of the defined types were placed in this encounter.   -------------------------------

## 2020-10-19 ENCOUNTER — Other Ambulatory Visit: Payer: Self-pay

## 2020-10-19 ENCOUNTER — Ambulatory Visit: Payer: Medicare HMO | Admitting: Podiatry

## 2020-10-19 DIAGNOSIS — M79676 Pain in unspecified toe(s): Secondary | ICD-10-CM

## 2020-10-19 DIAGNOSIS — E1169 Type 2 diabetes mellitus with other specified complication: Secondary | ICD-10-CM | POA: Diagnosis not present

## 2020-10-19 DIAGNOSIS — L853 Xerosis cutis: Secondary | ICD-10-CM | POA: Diagnosis not present

## 2020-10-19 DIAGNOSIS — E669 Obesity, unspecified: Secondary | ICD-10-CM | POA: Diagnosis not present

## 2020-10-19 DIAGNOSIS — B351 Tinea unguium: Secondary | ICD-10-CM

## 2020-10-20 ENCOUNTER — Encounter: Payer: Self-pay | Admitting: Podiatry

## 2020-10-20 NOTE — Progress Notes (Signed)
This patient returns to my office for at risk foot care.  This patient requires this care by a professional since this patient will be at risk due to having diabetes.  This patient is unable to cut nails himself since the patient cannot reach his nails.These nails are painful walking and wearing shoes.  This patient presents for at risk foot care today.  Patient presents with secondary complaint today of dry skin.  He would like to discuss treatment options he does not moisturize his foot.  He denies any other acute complaints.  General Appearance  Alert, conversant and in no acute stress.  Vascular  Dorsalis pedis and posterior tibial  pulses are palpable  bilaterally.  Capillary return is within normal limits  bilaterally. Temperature is within normal limits  bilaterally.  Neurologic  Senn-Weinstein monofilament wire test within normal limits  bilaterally. Muscle power within normal limits bilaterally.  Nails Thick disfigured discolored nails with subungual debris  from hallux to fifth toes bilaterally. No evidence of bacterial infection or drainage bilaterally.  Orthopedic  No limitations of motion  feet .  No crepitus or effusions noted.  No bony pathology or digital deformities noted.  Skin  normotropic skin with no porokeratosis noted bilaterally.  No signs of infections or ulcers noted.   Dry skin noted to both lower extremity.  Onychomycosis  Pain in right toes  Pain in left toes  Consent was obtained for treatment procedures.   Mechanical debridement of nails 1-5  bilaterally performed with a nail nipper.  Filed with dremel without incident.    Return office visit   10 weeks                   Told patient to return for periodic foot care and evaluation due to potential at risk complications.  Xerosis skin -I explained to the patient the etiology of xerosis and various treatment options were extensively discussed.  I explained to the patient the importance of maintaining moisturization  of the skin with application of over-the-counter lotion such as Eucerin or Luciderm.  I have asked the patient to apply this twice a day.  If unable to resolve patient will benefit from prescription lotion.    Boneta Lucks D.P.M.

## 2020-10-21 ENCOUNTER — Encounter (INDEPENDENT_AMBULATORY_CARE_PROVIDER_SITE_OTHER): Payer: Medicare HMO

## 2020-10-21 ENCOUNTER — Ambulatory Visit (INDEPENDENT_AMBULATORY_CARE_PROVIDER_SITE_OTHER): Payer: Medicare HMO | Admitting: Vascular Surgery

## 2020-10-26 ENCOUNTER — Other Ambulatory Visit (INDEPENDENT_AMBULATORY_CARE_PROVIDER_SITE_OTHER): Payer: Self-pay | Admitting: Vascular Surgery

## 2020-10-26 DIAGNOSIS — I714 Abdominal aortic aneurysm, without rupture, unspecified: Secondary | ICD-10-CM

## 2020-10-27 ENCOUNTER — Ambulatory Visit (INDEPENDENT_AMBULATORY_CARE_PROVIDER_SITE_OTHER): Payer: Self-pay | Admitting: Nurse Practitioner

## 2020-10-27 ENCOUNTER — Encounter (INDEPENDENT_AMBULATORY_CARE_PROVIDER_SITE_OTHER): Payer: Medicare HMO

## 2020-10-28 ENCOUNTER — Ambulatory Visit (INDEPENDENT_AMBULATORY_CARE_PROVIDER_SITE_OTHER): Payer: Medicare HMO | Admitting: Nurse Practitioner

## 2020-10-28 ENCOUNTER — Other Ambulatory Visit: Payer: Self-pay

## 2020-10-28 ENCOUNTER — Ambulatory Visit (INDEPENDENT_AMBULATORY_CARE_PROVIDER_SITE_OTHER): Payer: Medicare HMO

## 2020-10-28 ENCOUNTER — Encounter (INDEPENDENT_AMBULATORY_CARE_PROVIDER_SITE_OTHER): Payer: Self-pay | Admitting: Nurse Practitioner

## 2020-10-28 VITALS — BP 139/67 | HR 74 | Resp 16 | Wt 268.4 lb

## 2020-10-28 DIAGNOSIS — I714 Abdominal aortic aneurysm, without rupture, unspecified: Secondary | ICD-10-CM

## 2020-10-28 DIAGNOSIS — I10 Essential (primary) hypertension: Secondary | ICD-10-CM

## 2020-10-28 DIAGNOSIS — E785 Hyperlipidemia, unspecified: Secondary | ICD-10-CM

## 2020-10-28 DIAGNOSIS — I890 Lymphedema, not elsewhere classified: Secondary | ICD-10-CM | POA: Diagnosis not present

## 2020-10-28 NOTE — Progress Notes (Signed)
Subjective:    Patient ID: Duane Hughes, male    DOB: 21-Nov-1950, 70 y.o.   MRN: [id number] Chief Complaint  Patient presents with   Follow-up    Ultrasound follow up      The patient presents to the office for evaluation of an abdominal aortic aneurysm. The aneurysm was found incidentally and is followed by ultrasound. Patient denies abdominal pain or unusual back pain, no other abdominal complaints.  No history of an acute onset of painful blue discoloration of the toes.      No family history of AAA.     Since the previous visit the patient has been wearing graduated compression stockings and has noted little if any improvement in the lymphedema. The patient has not been using compression routinely morning until night.   The patient also states elevation during the day and exercise is being done too.     Patient denies amaurosis fugax or TIA symptoms. There is no history of claudication or rest pain symptoms of the lower extremities.  The patient denies angina or shortness of breath.   Duplex ultrasound shows an AAA that measures 2.80 cm, the previous measurement was 3.6 cm   Review of Systems  All other systems reviewed and are negative.     Objective:   Physical Exam Vitals reviewed.  HENT:     Head: Normocephalic.  Cardiovascular:     Rate and Rhythm: Normal rate.     Pulses: Normal pulses.  Pulmonary:     Effort: Pulmonary effort is normal.  Skin:    General: Skin is warm and dry.  Neurological:     Mental Status: He is alert and oriented to person, place, and time.  Psychiatric:        Mood and Affect: Mood normal.        Behavior: Behavior normal.        Thought Content: Thought content normal.        Judgment: Judgment normal.    BP 139/67 (BP Location: Right Arm)   Pulse 74   Resp 16   Wt 268 lb 6.4 oz (121.7 kg)   BMI 39.64 kg/m   Past Medical History:  Diagnosis Date   Anemia    Anxiety    Dr.Jessica Carter . well controlled   Arthritis     back, hands, knees   Colon polyp 05/09/2016   TUBULAR ADENOMAS    Depression    Diabetes mellitus without complication ([place])    type 2   Ectatic abdominal aorta ([place]) 04/18/2016   2.9 cm on [place] 2016, unchanged 2017   History of hiatal hernia    20 years ago   History of skin cancer    hand   Hyperlipidemia    Hypertension 2015   Dr. Sanda Klein   Morbid obesity ([place]) 11/11/2014   Neuropathy    Obesity 11/11/2014   Sleep apnea    cpap   Squamous cell carcinoma of skin 04/11/2018   SCC In Situ R dorsum wrist   Squamous cell carcinoma of skin 06/23/2015   R dorsum hand    Social History   Socioeconomic History   Marital status: Married    Spouse name: michele Goodridge   Number of children: 6   Years of education: Not on file   Highest education level: Associate degree: academic program  Occupational History    Comment: part time  Tobacco Use   Smoking status: Never   Smokeless tobacco: Never  Vaping  Use   Vaping Use: Never used  Substance and Sexual Activity   Alcohol use: No    Alcohol/week: 0.0 standard drinks   Drug use: No   Sexual activity: Yes    Partners: Female  Other Topics Concern   Not on file  Social History Narrative   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   Social Determinants of Health   Financial Resource Strain: Low Risk    Difficulty of Paying Living Expenses: Not hard at all  Food Insecurity: No Food Insecurity   Worried About Charity fundraiser in the Last Year: Never true   [place] in the Last Year: Never true  Transportation Needs: No Transportation Needs   Lack of Transportation (Medical): No   Lack of Transportation (Non-Medical): No  Physical Activity: Sufficiently Active   Days of Exercise per Week: 7 days   Minutes of Exercise per Session: 60 min  Stress: No Stress Concern Present   Feeling of Stress : Not at all  Social Connections: Socially Integrated   Frequency of Communication with Friends and Family: More than  three times a week   Frequency of Social Gatherings with Friends and Family: Three times a week   Attends Religious Services: More than 4 times per year   Active Member of Clubs or Organizations: Yes   Attends Archivist Meetings: 1 to 4 times per year   Marital Status: Married  Human resources officer Violence: Not At Risk   Fear of Current or Ex-Partner: No   Emotionally Abused: No   Physically Abused: No   Sexually Abused: No    Past Surgical History:  Procedure Laterality Date   COLONOSCOPY WITH PROPOFOL N/A 05/09/2016   Procedure: COLONOSCOPY WITH PROPOFOL;  Surgeon: Lucilla Lame, MD;  Location: [facility];  Service: Endoscopy;  Laterality: N/A;   HERNIA REPAIR     Hiatal   JOINT REPLACEMENT     04-04-17 Dr. Gladstone Lighter   Right knee   KNEE ARTHROSCOPY     several scopes done   KNEE CLOSED REDUCTION Right 05/31/2017   Procedure: CLOSED MANIPULATION RIGHT KNEE;  Surgeon: Latanya Maudlin, MD;  Location: [facility];  Service: Orthopedics;  Laterality: Right;  Femoral Block   LUMBAR LAMINECTOMY/DECOMPRESSION MICRODISCECTOMY N/A 01/26/2020   Procedure: L3, L4 LAMINECTOMIES;  Surgeon: Deetta Perla, MD;  Location: [facility];  Service: Neurosurgery;  Laterality: N/A;   MEDIAL PARTIAL KNEE REPLACEMENT Left    2000s?   SKIN CANCER EXCISION  Feb 2017   removed from right hand   TOTAL KNEE ARTHROPLASTY Right 04/04/2017   Procedure: RIGHT TOTAL KNEE ARTHROPLASTY;  Surgeon: Latanya Maudlin, MD;  Location: [facility];  Service: Orthopedics;  Laterality: Right;   TOTAL KNEE ARTHROPLASTY Left 09/24/2018   Procedure: REVISION LEFT KNEE ARTHROPLASTY;  Surgeon: Paralee Cancel, MD;  Location: [facility];  Service: Orthopedics;  Laterality: Left;  70 mins    Family History  Problem Relation Age of Onset   Alcohol abuse Mother    Rheum arthritis Mother    Stroke Maternal Grandmother    Stroke Paternal Grandfather    Dementia Paternal Grandmother    Cancer Neg Hx    Diabetes Neg Hx    Heart disease Neg Hx     Hypertension Neg Hx    Kidney disease Neg Hx    Prostate cancer Neg Hx     No Known Allergies  CBC Latest Ref Rng & Units 06/07/2020 01/22/2020 11/04/2019  WBC  3.8 - 10.8 Thousand/uL 5.2 8.6 5.7  Hemoglobin 13.2 - 17.1 g/dL 14.5 15.8 15.4  Hematocrit 38.5 - 50.0 % 42.6 47.9 45.8  Platelets 140 - 400 Thousand/uL 250 302 271      CMP     Component Value Date/Time   NA 140 06/07/2020 0812   NA 139 05/12/2015 1537   K 4.3 06/07/2020 0812   CL 108 06/07/2020 0812   CO2 23 06/07/2020 0812   GLUCOSE 105 (H) 06/07/2020 0812   BUN 15 06/07/2020 0812   BUN 15 05/12/2015 1537   CREATININE 0.90 06/07/2020 0812   CALCIUM 8.4 (L) 06/07/2020 0812   PROT 5.9 (L) 06/07/2020 0812   PROT 6.2 03/19/2015 0825   ALBUMIN 4.1 09/19/2016 1400   ALBUMIN 4.0 03/19/2015 0825   AST 26 06/07/2020 0812   ALT 30 06/07/2020 0812   ALKPHOS 46 09/19/2016 1400   BILITOT 0.2 06/07/2020 0812   BILITOT 0.5 03/19/2015 0825   GFRNONAA 86 06/07/2020 0812   GFRAA 100 06/07/2020 0812     No results found.     Assessment & Plan:   1. Abdominal aortic aneurysm (AAA) without rupture ([place]) No surgery or intervention at this time. The patient has an asymptomatic abdominal aortic aneurysm that is less than 4 cm in maximal diameter.  The patient had studies last year that displayed an aneurysm at 3.6 cm.  There is a drastic reduction in size today so we will continue to remain on annual follow-up.  The patient notes that  I have discussed the natural history of abdominal aortic aneurysm and the small risk of rupture for aneurysm less than 5 cm in size.  However, as these small aneurysms tend to enlarge over time, continued surveillance with ultrasound or CT scan is mandatory.  I have also discussed optimizing medical management with hypertension and lipid control and the importance of abstinence from tobacco.  The patient is also encouraged to exercise a minimum of 30 minutes 4 times a week.  Should the patient develop  new onset abdominal or back pain or signs of peripheral embolization they are instructed to seek medical attention immediately and to alert the physician providing care that they have an aneurysm.  The patient voices their understanding. The patient will return in 12 months with an aortic duplex.   2. Lymphedema Currently doing well with this.  Patient will continue with conservative therapy elevation and exercise  3. Benign hypertension Continue antihypertensive medications as already ordered, these medications have been reviewed and there are no changes at this time.   4. Dyslipidemia Continue statin as ordered and reviewed, no changes at this time    Current Outpatient Medications on File Prior to Visit  Medication Sig Dispense Refill   aspirin EC 81 MG tablet Take 81 mg by mouth daily. Swallow whole.     atorvastatin (LIPITOR) 10 MG tablet Take 1 tablet (10 mg total) by mouth at bedtime. 90 tablet 3   baclofen (LIORESAL) 10 MG tablet Take 0.5-1 tablets (5-10 mg total) by mouth 3 (three) times daily as needed for muscle spasms. 30 each 0   buPROPion (WELLBUTRIN SR) 150 MG 12 hr tablet TAKE 1 TABLET (150 MG TOTAL) BY MOUTH 2 (TWO) TIMES DAILY. 180 tablet 3   Cholecalciferol (VITAMIN D) 50 MCG (2000 UT) tablet Take 2,000 Units by mouth daily.     Cyanocobalamin (B-12) 1000 MCG SUBL Place under the tongue.     diclofenac Sodium (VOLTAREN) 1 % GEL Apply 2  g topically 4 (four) times daily as needed (for chronic pain management). 100 g 5   diphenhydramine-acetaminophen (TYLENOL PM) 25-500 MG TABS tablet Take 1 tablet by mouth at bedtime as needed.     ferrous sulfate 325 (65 FE) MG EC tablet Take 1 tablet (325 mg total) by mouth 3 (three) times a week. 36 tablet 3   hydrocortisone 2.5 % cream Apply topically daily. 3 times per week 30 g 11   ketoconazole (NIZORAL) 2 % cream Apply 1 application topically daily. 3 times per week 60 g 11   lamoTRIgine (LAMICTAL) 200 MG tablet Take 1 tablet (200  mg total) by mouth daily. 90 tablet 3   lisinopril (ZESTRIL) 5 MG tablet Take 0.5 tablets (2.5 mg total) by mouth daily. 45 tablet 3   metFORMIN (GLUCOPHAGE) 500 MG tablet Take 2 tablets with breakfast and 1 tablet with dinner daily. 270 tablet 3   naproxen (NAPROSYN) 250 MG tablet Take 250 mg by mouth daily with breakfast.     Omega-3 Fatty Acids (FISH OIL) 1000 MG CAPS Take 1,000 mg by mouth daily.      oxyCODONE-acetaminophen (PERCOCET/ROXICET) 5-325 MG tablet Take 1 tablet by mouth every 8 (eight) hours as needed for severe pain.     pramipexole (MIRAPEX) 0.5 MG tablet Take 1 tablet (0.5 mg total) by mouth 2 (two) times daily. 180 tablet 3   sildenafil (REVATIO) 20 MG tablet TAKE 3-5 TABLETS 2 HOURS BEFORE INTERCOURSE ON AN EMPTY STOMACH DO NOT TAKE WITH NITRATES 50 tablet 3   vortioxetine HBr (TRINTELLIX) 10 MG TABS tablet Take 1 tablet (10 mg total) by mouth daily. 30 tablet 1   citalopram (CELEXA) 20 MG tablet Take 1/2 tablet daily for one week, then stop 30 tablet 2   No current facility-administered medications on file prior to visit.    There are no Patient Instructions on file for this visit. No follow-ups on file.   Kris Hartmann, NP

## 2020-11-24 ENCOUNTER — Telehealth: Payer: Self-pay

## 2020-11-24 NOTE — Progress Notes (Signed)
Chronic Care Management Pharmacy Assistant   Name: Duane Hughes  MRN: [id number] DOB: Sep 21, 1950  Reason for Encounter:Diabetes and Hypertension Disease State Call.   Recent office visits:  No recent Office Visit  Recent consult visits:  10/28/2020 Kris Hartmann NP (Vascular Surgery) 10/19/2020 Boneta Lucks  DPM (Podiatry)  10/18/2020 [facility])  Hospital visits:  None in previous 6 months  Medications: Outpatient Encounter Medications as of 11/24/2020  Medication Sig Note   aspirin EC 81 MG tablet Take 81 mg by mouth daily. Swallow whole.    atorvastatin (LIPITOR) 10 MG tablet Take 1 tablet (10 mg total) by mouth at bedtime.    baclofen (LIORESAL) 10 MG tablet Take 0.5-1 tablets (5-10 mg total) by mouth 3 (three) times daily as needed for muscle spasms.    buPROPion (WELLBUTRIN SR) 150 MG 12 hr tablet TAKE 1 TABLET (150 MG TOTAL) BY MOUTH 2 (TWO) TIMES DAILY.    Cholecalciferol (VITAMIN D) 50 MCG (2000 UT) tablet Take 2,000 Units by mouth daily.    citalopram (CELEXA) 20 MG tablet Take 1/2 tablet daily for one week, then stop    Cyanocobalamin (B-12) 1000 MCG SUBL Place under the tongue.    diclofenac Sodium (VOLTAREN) 1 % GEL Apply 2 g topically 4 (four) times daily as needed (for chronic pain management).    diphenhydramine-acetaminophen (TYLENOL PM) 25-500 MG TABS tablet Take 1 tablet by mouth at bedtime as needed.    ferrous sulfate 325 (65 FE) MG EC tablet Take 1 tablet (325 mg total) by mouth 3 (three) times a week.    hydrocortisone 2.5 % cream Apply topically daily. 3 times per week    ketoconazole (NIZORAL) 2 % cream Apply 1 application topically daily. 3 times per week    lamoTRIgine (LAMICTAL) 200 MG tablet Take 1 tablet (200 mg total) by mouth daily.    lisinopril (ZESTRIL) 5 MG tablet Take 0.5 tablets (2.5 mg total) by mouth daily.    metFORMIN (GLUCOPHAGE) 500 MG tablet Take 2 tablets with breakfast and 1 tablet with dinner daily.     naproxen (NAPROSYN) 250 MG tablet Take 250 mg by mouth daily with breakfast.    Omega-3 Fatty Acids (FISH OIL) 1000 MG CAPS Take 1,000 mg by mouth daily.     oxyCODONE-acetaminophen (PERCOCET/ROXICET) 5-325 MG tablet Take 1 tablet by mouth every 8 (eight) hours as needed for severe pain. 07/14/2020: Received supply following back surgery   pramipexole (MIRAPEX) 0.5 MG tablet Take 1 tablet (0.5 mg total) by mouth 2 (two) times daily.    sildenafil (REVATIO) 20 MG tablet TAKE 3-5 TABLETS 2 HOURS BEFORE INTERCOURSE ON AN EMPTY STOMACH DO NOT TAKE WITH NITRATES    vortioxetine HBr (TRINTELLIX) 10 MG TABS tablet Take 1 tablet (10 mg total) by mouth daily.    No facility-administered encounter medications on file as of 11/24/2020.    Care Gaps: Covid-19 Vaccine Shingrix Vaccine Foot Exam Star Rating Drugs: Atorvastatin 10 mg last filled 03/02/2020 for a 90-Day supply at [facility] (ask about refill) Lisinopril 5 mg last filled 08/26/2020 for a 90-Day supply at [facility] Metformin 500 mg last filled 06/14//2022 a 19- Day supply at [facility] Medication Fill Gaps: Bupropion 150 MG last filled 04/19/2020 90 day supply  Reviewed chart prior to disease state call. Spoke with patient regarding BP  Recent Office Vitals: BP Readings from Last 3 Encounters:  10/28/20 139/67  09/16/20 126/72  03/09/20 (!) 141/87  Pulse Readings from Last 3 Encounters:  10/28/20 74  09/16/20 82  03/09/20 83    Wt Readings from Last 3 Encounters:  10/28/20 268 lb 6.4 oz (121.7 kg)  09/16/20 264 lb 14.4 oz (120.2 kg)  03/09/20 264 lb (119.7 kg)     Kidney Function Lab Results  Component Value Date/Time   CREATININE 0.90 06/07/2020 08:12 AM   CREATININE 0.87 01/22/2020 11:48 AM   CREATININE 0.98 11/04/2019 03:26 PM   GFRNONAA 86 06/07/2020 08:12 AM   GFRAA 100 06/07/2020 08:12 AM    BMP Latest Ref Rng & Units 06/07/2020 01/22/2020 11/04/2019  Glucose 65 - 99 mg/dL 105(H) 120(H) 148(H)   BUN 7 - 25 mg/dL '15 17 15  '$ Creatinine 0.70 - 1.18 mg/dL 0.90 0.87 0.98  BUN/Creat Ratio 6 - 22 (calc) NOT APPLICABLE - NOT APPLICABLE  Sodium A999333 - 146 mmol/L 140 137 141  Potassium 3.5 - 5.3 mmol/L 4.3 4.3 4.0  Chloride 98 - 110 mmol/L 108 104 108  CO2 20 - 32 mmol/L '23 23 24  '$ Calcium 8.6 - 10.3 mg/dL 8.4(L) 8.4(L) 8.8    Current antihypertensive regimen:  Lisinopril 5 mg 1/2 tablet daily How often are you checking your Blood Pressure? 1-2x per week Current home BP readings:  Patient states he donates his plasma twice a week and they check his blood pressure there.Patient states his blood pressure is "good". What recent interventions/DTPs have been made by any provider to improve Blood Pressure control since last CPP Visit: None ID Any recent hospitalizations or ED visits since last visit with CPP? No What diet changes have been made to improve Blood Pressure Control?  Patient reports  he works a lot so he eat's out often. Patient reports he does not watch what he eats. What exercise is being done to improve your Blood Pressure Control?  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  Adherence Review: Is the patient currently on ACE/ARB medication? No Does the patient have >5 day gap between last estimated fill dates? No  Recent Relevant Labs: Lab Results  Component Value Date/Time   HGBA1C 6.9 (H) 06/07/2020 08:12 AM   HGBA1C 6.3 (H) 09/04/2019 02:48 PM   HGBA1C 7.0 (H) 04/07/2015 03:46 PM   MICROALBUR 0.6 11/27/2017 02:19 PM   MICROALBUR <0.2 04/04/2016 11:44 AM    Kidney Function Lab Results  Component Value Date/Time   CREATININE 0.90 06/07/2020 08:12 AM   CREATININE 0.87 01/22/2020 11:48 AM   CREATININE 0.98 11/04/2019 03:26 PM   GFRNONAA 86 06/07/2020 08:12 AM   GFRAA 100 06/07/2020 08:12 AM    Current antihyperglycemic regimen:  Metformin 500 mg  2 tablets AM, 1 tablet PM What recent interventions/DTPs have been made to  improve glycemic control:  None ID Have there been any recent hospitalizations or ED visits since last visit with CPP? No Patient denies hypoglycemic symptoms, including Pale, Sweaty, Shaky, Hungry, Nervous/irritable, and Vision changes Patient denies hyperglycemic symptoms, including blurry vision, excessive thirst, fatigue, polyuria, and weakness How often are you checking your blood sugar?  Patient states he does not check his blood sugar at home, but his A1C is "good":Marland Kitchen What are your blood sugars ranging?  None ID During the week, how often does your blood glucose drop below 70?  N/A Are you checking your feet daily/regularly?  Patient states he has Neuropathy.  Adherence Review: Is the patient currently on a STATIN medication? Yes Is the  patient currently on ACE/ARB medication? Yes Does the patient have >5 day gap between last estimated fill dates? Yes   [place] Pharmacist Assistant [phone]

## 2020-11-29 ENCOUNTER — Other Ambulatory Visit: Payer: Self-pay

## 2020-11-29 ENCOUNTER — Ambulatory Visit (INDEPENDENT_AMBULATORY_CARE_PROVIDER_SITE_OTHER): Payer: Medicare HMO | Admitting: Psychiatry

## 2020-11-29 ENCOUNTER — Encounter: Payer: Self-pay | Admitting: Psychiatry

## 2020-11-29 DIAGNOSIS — R69 Illness, unspecified: Secondary | ICD-10-CM | POA: Diagnosis not present

## 2020-11-29 DIAGNOSIS — F329 Major depressive disorder, single episode, unspecified: Secondary | ICD-10-CM

## 2020-11-29 DIAGNOSIS — F32A Depression, unspecified: Secondary | ICD-10-CM

## 2020-11-29 MED ORDER — LAMOTRIGINE 150 MG PO TABS: 150.0000 mg | ORAL_TABLET | Freq: Every day | ORAL | 1 refills | Status: DC

## 2020-11-29 MED ORDER — DULOXETINE HCL 60 MG PO CPEP: 60.0000 mg | ORAL_CAPSULE | Freq: Every day | ORAL | 1 refills | Status: DC

## 2020-11-29 MED ORDER — DULOXETINE HCL 30 MG PO CPEP
ORAL_CAPSULE | ORAL | 0 refills | Status: DC
Start: 2020-11-29 — End: 2021-01-04

## 2020-11-29 NOTE — Progress Notes (Signed)
SLATE BOEDIGHEIMER [id number] [birth year] 70 y.o.  Subjective:   Patient ID:  Duane Hughes is a 70 y.o. (DOB [birth year]) male.  Chief Complaint:  Chief Complaint  Patient presents with   Depression    HPI Duane Hughes presents to the office today for follow-up of depression. He reports that he has ben having continued worsening depression. He reports that he has occasional days where he has been able to do things. He reports some irritability. He notices some low energy and low motivation. Denies anxiety. Sleeping ok overall with rare nights of disrupted sleep. Appetite has been good. Concentration has not been as good. He reports that he has been forgetting things more. Slight decrease in some activities. Continues to enjoy golf. He reports that neuropathy has been interfering with his ability to play golf- "and this may be part of the depression." He reports occasional fleeting thoughts of suicide. Denies suicidal intent or plan.    Past Psychiatric Medication Trials: Pristiq-ineffective Effexor-effective for 2 years then no longer effective Bupropion-taken for years Cymbalta-helped for 2 years but no longer effective Sertraline- Not effective. Loose stools. Celexa Trintellix Abilify-not effective at 5 mg and increased emotional lability at 10 mg Lamictal Lithium Pramipexole- effective for mood signs and symptoms Trazodone  GAD-7    Flowsheet Row Office Visit from 09/16/2020 in [facility] Office Visit from 11/04/2019 in [facility]  Total GAD-7 Score 1 0      PHQ2-9    [place] Office Visit from 09/16/2020 in [place] from 05/20/2020 in [facility] Office Visit from 11/04/2019 in [facility] Office Visit from 09/18/2019 in [facility] Office Visit from 09/04/2019 in [facility]  PHQ-2 Total Score 1 0 0 0 0  PHQ-9 Total  Score 3 -- 0 0 0        Review of Systems:  Review of Systems  Musculoskeletal:  Negative for gait problem.  Neurological:  Negative for tremors.       Neuropathy  Psychiatric/Behavioral:         Please refer to HPI   Medications: I have reviewed the patient's current medications.  Current Outpatient Medications  Medication Sig Dispense Refill   DULoxetine (CYMBALTA) 30 MG capsule Take 1 capsule po q am x 1 week, then 2 capsules po q am 30 capsule 0   DULoxetine (CYMBALTA) 60 MG capsule Take 1 capsule (60 mg total) by mouth daily. 30 capsule 1   aspirin EC 81 MG tablet Take 81 mg by mouth daily. Swallow whole.     atorvastatin (LIPITOR) 10 MG tablet Take 1 tablet (10 mg total) by mouth at bedtime. 90 tablet 3   baclofen (LIORESAL) 10 MG tablet Take 0.5-1 tablets (5-10 mg total) by mouth 3 (three) times daily as needed for muscle spasms. 30 each 0   buPROPion (WELLBUTRIN SR) 150 MG 12 hr tablet TAKE 1 TABLET (150 MG TOTAL) BY MOUTH 2 (TWO) TIMES DAILY. 180 tablet 3   Cholecalciferol (VITAMIN D) 50 MCG (2000 UT) tablet Take 2,000 Units by mouth daily.     Cyanocobalamin (B-12) 1000 MCG SUBL Place under the tongue.     diclofenac Sodium (VOLTAREN) 1 % GEL Apply 2 g topically 4 (four) times daily as needed (for chronic pain management). 100 g 5   diphenhydramine-acetaminophen (TYLENOL PM) 25-500 MG TABS tablet Take 1 tablet by mouth at bedtime as needed.  ferrous sulfate 325 (65 FE) MG EC tablet Take 1 tablet (325 mg total) by mouth 3 (three) times a week. 36 tablet 3   hydrocortisone 2.5 % cream Apply topically daily. 3 times per week 30 g 11   ketoconazole (NIZORAL) 2 % cream Apply 1 application topically daily. 3 times per week 60 g 11   lamoTRIgine (LAMICTAL) 150 MG tablet Take 1 tablet (150 mg total) by mouth daily. 30 tablet 1   lisinopril (ZESTRIL) 5 MG tablet Take 0.5 tablets (2.5 mg total) by mouth daily. 45 tablet 3   metFORMIN (GLUCOPHAGE) 500 MG tablet Take 2 tablets with  breakfast and 1 tablet with dinner daily. 270 tablet 3   naproxen (NAPROSYN) 250 MG tablet Take 250 mg by mouth daily with breakfast.     Omega-3 Fatty Acids (FISH OIL) 1000 MG CAPS Take 1,000 mg by mouth daily.      oxyCODONE-acetaminophen (PERCOCET/ROXICET) 5-325 MG tablet Take 1 tablet by mouth every 8 (eight) hours as needed for severe pain.     pramipexole (MIRAPEX) 0.5 MG tablet Take 1 tablet (0.5 mg total) by mouth 2 (two) times daily. 180 tablet 3   sildenafil (REVATIO) 20 MG tablet TAKE 3-5 TABLETS 2 HOURS BEFORE INTERCOURSE ON AN EMPTY STOMACH DO NOT TAKE WITH NITRATES 50 tablet 3   vortioxetine HBr (TRINTELLIX) 10 MG TABS tablet Take 1 tablet (10 mg total) by mouth daily. (Patient not taking: Reported on 11/29/2020) 30 tablet 1   No current facility-administered medications for this visit.    Medication Side Effects: Other: Loose stools  Allergies: No Known Allergies  Past Medical History:  Diagnosis Date   Anemia    Anxiety    Dr.Macario Shear . well controlled   Arthritis    back, hands, knees   Colon polyp 05/09/2016   TUBULAR ADENOMAS    Depression    Diabetes mellitus without complication ([place])    type 2   Ectatic abdominal aorta ([place]) 04/18/2016   2.9 cm on [place] 2016, unchanged 2017   History of hiatal hernia    20 years ago   History of skin cancer    hand   Hyperlipidemia    Hypertension 2015   Dr. Sanda Klein   Morbid obesity ([place]) 11/11/2014   Neuropathy    Obesity 11/11/2014   Sleep apnea    cpap   Squamous cell carcinoma of skin 04/11/2018   SCC In Situ R dorsum wrist   Squamous cell carcinoma of skin 06/23/2015   R dorsum hand    Past Medical History, Surgical history, Social history, and Family history were reviewed and updated as appropriate.   Please see review of systems for further details on the patient's review from today.   Objective:   Physical Exam:  There were no vitals taken for this visit.  Physical Exam Constitutional:      General:  He is not in acute distress. Musculoskeletal:        General: No deformity.  Neurological:     Mental Status: He is alert and oriented to person, place, and time.     Coordination: Coordination normal.  Psychiatric:        Attention and Perception: Attention and perception normal. He does not perceive auditory or visual hallucinations.        Mood and Affect: Mood is depressed. Mood is not anxious. Affect is not labile, blunt, angry or inappropriate.        Speech: Speech normal.  Behavior: Behavior normal.        Thought Content: Thought content normal. Thought content is not paranoid or delusional. Thought content does not include homicidal or suicidal ideation. Thought content does not include homicidal or suicidal plan.        Cognition and Memory: Cognition and memory normal.        Judgment: Judgment normal.     Comments: Insight intact    Lab Review:     Component Value Date/Time   NA 140 06/07/2020 0812   NA 139 05/12/2015 1537   K 4.3 06/07/2020 0812   CL 108 06/07/2020 0812   CO2 23 06/07/2020 0812   GLUCOSE 105 (H) 06/07/2020 0812   BUN 15 06/07/2020 0812   BUN 15 05/12/2015 1537   CREATININE 0.90 06/07/2020 0812   CALCIUM 8.4 (L) 06/07/2020 0812   PROT 5.9 (L) 06/07/2020 0812   PROT 6.2 03/19/2015 0825   ALBUMIN 4.1 09/19/2016 1400   ALBUMIN 4.0 03/19/2015 0825   AST 26 06/07/2020 0812   ALT 30 06/07/2020 0812   ALKPHOS 46 09/19/2016 1400   BILITOT 0.2 06/07/2020 0812   BILITOT 0.5 03/19/2015 0825   GFRNONAA 86 06/07/2020 0812   GFRAA 100 06/07/2020 0812       Component Value Date/Time   WBC 5.2 06/07/2020 0812   RBC 4.63 06/07/2020 0812   HGB 14.5 06/07/2020 0812   HGB 14.8 06/20/2016 1458   HCT 42.6 06/07/2020 0812   HCT 43.6 06/20/2016 1458   PLT 250 06/07/2020 0812   PLT 260 06/20/2016 1458   MCV 92.0 06/07/2020 0812   MCV 93 06/20/2016 1458   MCH 31.3 06/07/2020 0812   MCHC 34.0 06/07/2020 0812   RDW 13.6 06/07/2020 0812   RDW 13.3  06/20/2016 1458   LYMPHSABS 1,404 06/07/2020 0812   LYMPHSABS 2.0 06/20/2016 1458   MONOABS 0.4 03/29/2017 1343   EOSABS 312 06/07/2020 0812   EOSABS 0.4 06/20/2016 1458   BASOSABS 52 06/07/2020 0812   BASOSABS 0.1 06/20/2016 1458    No results found for: POCLITH, LITHIUM   No results found for: PHENYTOIN, PHENOBARB, VALPROATE, CBMZ   .res Assessment: Plan:    Patient seen for 30 minutes and time spent counseling patient regarding possible treatment options for depression.  Discussed he has had limited improvement to multiple antidepressants, however SNRI's have seemed to be most effective. Discussed considering re-trial of Cymbalta since this had been effective for depressive s/s in the past and it also is indicated for neuropathy. Reviewed potential benefits, risks, and side effects of Cymbalta. He agrees to re-trial of Cymbalta 30 mg po qd for one week, then increase to 60 mg po qd. Discussed decreasing Lamictal to 150 mg po qd due to possible cognitive side effects and limited improvement. Continue Wellbutrin SR 150 mg po BID and depression.  Continue Pramipexole 0.5 mg po BID for off-label indication for depression.  Pt to follow-up in 4 weeks or sooner if clinically indicated.  Patient advised to contact office with any questions, adverse effects, or acute worsening in signs and symptoms.   Deavonte was seen today for depression.  Diagnoses and all orders for this visit:  Chronic depressive disorder -     DULoxetine (CYMBALTA) 30 MG capsule; Take 1 capsule po q am x 1 week, then 2 capsules po q am -     DULoxetine (CYMBALTA) 60 MG capsule; Take 1 capsule (60 mg total) by mouth daily. -     lamoTRIgine (LAMICTAL)  150 MG tablet; Take 1 tablet (150 mg total) by mouth daily.    Please see After Visit Summary for patient specific instructions.  Future Appointments  Date Time Provider [place]  12/17/2020  1:40 PM Delsa Grana, PA-C [place] [facility]  01/04/2021  4:00 PM  Thayer Headings, PMHNP CP-CP None  01/13/2021  1:00 PM [place] [facility]  01/20/2021  1:45 PM Felipa Furnace, DPM [facility]  02/03/2021  3:30 PM Thayer Headings, PMHNP CP-CP None  03/03/2021  3:00 PM [facility] None  03/10/2021  3:00 PM McGowan, Hunt Oris, PA-C [facility] None  05/24/2021  2:50 PM [place] ADVISOR [place] [facility]  09/14/2021  3:45 PM Ralene Bathe, MD ASC-ASC None  10/27/2021  7:30 AM [facility] VASC 1 [facility]IMG None  10/27/2021  8:30 AM Schnier, Dolores Lory, MD [facility] None    No orders of the defined types were placed in this encounter.   -------------------------------

## 2020-12-14 DIAGNOSIS — E1169 Type 2 diabetes mellitus with other specified complication: Secondary | ICD-10-CM | POA: Diagnosis not present

## 2020-12-14 DIAGNOSIS — M5431 Sciatica, right side: Secondary | ICD-10-CM | POA: Diagnosis not present

## 2020-12-14 DIAGNOSIS — Z96653 Presence of artificial knee joint, bilateral: Secondary | ICD-10-CM | POA: Diagnosis not present

## 2020-12-14 DIAGNOSIS — I890 Lymphedema, not elsewhere classified: Secondary | ICD-10-CM | POA: Diagnosis not present

## 2020-12-14 DIAGNOSIS — E785 Hyperlipidemia, unspecified: Secondary | ICD-10-CM | POA: Diagnosis not present

## 2020-12-14 DIAGNOSIS — Z0189 Encounter for other specified special examinations: Secondary | ICD-10-CM | POA: Diagnosis not present

## 2020-12-14 DIAGNOSIS — G629 Polyneuropathy, unspecified: Secondary | ICD-10-CM | POA: Diagnosis not present

## 2020-12-14 DIAGNOSIS — E782 Mixed hyperlipidemia: Secondary | ICD-10-CM | POA: Diagnosis not present

## 2020-12-14 DIAGNOSIS — I10 Essential (primary) hypertension: Secondary | ICD-10-CM | POA: Diagnosis not present

## 2020-12-14 DIAGNOSIS — N400 Enlarged prostate without lower urinary tract symptoms: Secondary | ICD-10-CM | POA: Diagnosis not present

## 2020-12-14 DIAGNOSIS — M5432 Sciatica, left side: Secondary | ICD-10-CM | POA: Diagnosis not present

## 2020-12-14 DIAGNOSIS — I714 Abdominal aortic aneurysm, without rupture: Secondary | ICD-10-CM | POA: Diagnosis not present

## 2020-12-14 DIAGNOSIS — R69 Illness, unspecified: Secondary | ICD-10-CM | POA: Diagnosis not present

## 2020-12-16 DIAGNOSIS — Z6839 Body mass index (BMI) 39.0-39.9, adult: Secondary | ICD-10-CM | POA: Diagnosis not present

## 2020-12-16 DIAGNOSIS — G2581 Restless legs syndrome: Secondary | ICD-10-CM | POA: Diagnosis not present

## 2020-12-16 DIAGNOSIS — Z87891 Personal history of nicotine dependence: Secondary | ICD-10-CM | POA: Diagnosis not present

## 2020-12-16 DIAGNOSIS — I10 Essential (primary) hypertension: Secondary | ICD-10-CM | POA: Diagnosis not present

## 2020-12-16 DIAGNOSIS — R69 Illness, unspecified: Secondary | ICD-10-CM | POA: Diagnosis not present

## 2020-12-16 DIAGNOSIS — F419 Anxiety disorder, unspecified: Secondary | ICD-10-CM | POA: Diagnosis not present

## 2020-12-16 DIAGNOSIS — E119 Type 2 diabetes mellitus without complications: Secondary | ICD-10-CM | POA: Diagnosis not present

## 2020-12-17 ENCOUNTER — Ambulatory Visit: Payer: Medicare HMO | Admitting: Family Medicine

## 2020-12-21 ENCOUNTER — Other Ambulatory Visit: Payer: Self-pay | Admitting: Family Medicine

## 2020-12-21 DIAGNOSIS — R29898 Other symptoms and signs involving the musculoskeletal system: Secondary | ICD-10-CM

## 2020-12-21 DIAGNOSIS — G629 Polyneuropathy, unspecified: Secondary | ICD-10-CM

## 2020-12-27 ENCOUNTER — Ambulatory Visit
Admission: RE | Admit: 2020-12-27 | Discharge: 2020-12-27 | Disposition: A | Discharge status: Home / Self Care | Payer: Medicare HMO | Source: Ambulatory Visit | Attending: Family Medicine | Admitting: Family Medicine

## 2020-12-27 ENCOUNTER — Telehealth: Payer: Self-pay | Admitting: Psychiatry

## 2020-12-27 DIAGNOSIS — R531 Weakness: Secondary | ICD-10-CM | POA: Diagnosis not present

## 2020-12-27 DIAGNOSIS — G629 Polyneuropathy, unspecified: Secondary | ICD-10-CM

## 2020-12-27 DIAGNOSIS — R29898 Other symptoms and signs involving the musculoskeletal system: Secondary | ICD-10-CM

## 2020-12-27 DIAGNOSIS — R5383 Other fatigue: Secondary | ICD-10-CM | POA: Diagnosis not present

## 2020-12-27 DIAGNOSIS — I70213 Atherosclerosis of native arteries of extremities with intermittent claudication, bilateral legs: Secondary | ICD-10-CM | POA: Diagnosis not present

## 2020-12-27 NOTE — Telephone Encounter (Signed)
Please review

## 2020-12-27 NOTE — Telephone Encounter (Signed)
Pt called in stating that he was recently increased his dosage of Cymbalta to '60mg'$ . He asked how does he come off of as it is not working. Says its effecting bowel movements and puts him in kind of a stupor and feels cloudy. Eliminates sex life. Pls rtc [phone]

## 2020-12-28 NOTE — Telephone Encounter (Signed)
Pt informed

## 2020-12-28 NOTE — Telephone Encounter (Signed)
Pt wants to stop completely.He wants to know how long does he have to stay on the 30 mg dose

## 2020-12-28 NOTE — Telephone Encounter (Signed)
Recommend reducing back down to 30 mg. Can send in script for 30 mg if needed.

## 2021-01-04 ENCOUNTER — Ambulatory Visit (INDEPENDENT_AMBULATORY_CARE_PROVIDER_SITE_OTHER): Payer: Medicare HMO | Admitting: Psychiatry

## 2021-01-04 ENCOUNTER — Encounter: Payer: Self-pay | Admitting: Psychiatry

## 2021-01-04 ENCOUNTER — Other Ambulatory Visit: Payer: Self-pay

## 2021-01-04 DIAGNOSIS — F32A Depression, unspecified: Secondary | ICD-10-CM

## 2021-01-04 DIAGNOSIS — F329 Major depressive disorder, single episode, unspecified: Secondary | ICD-10-CM | POA: Diagnosis not present

## 2021-01-04 MED ORDER — VILAZODONE HCL 20 MG PO TABS: 20.0000 mg | ORAL_TABLET | Freq: Every day | ORAL | 1 refills | Status: DC

## 2021-01-04 MED ORDER — VIIBRYD STARTER PACK 10 & 20 MG PO KIT: PACK | ORAL | 0 refills | Status: DC

## 2021-01-05 ENCOUNTER — Telehealth: Payer: Self-pay | Admitting: Psychiatry

## 2021-01-05 NOTE — Telephone Encounter (Signed)
Pt left a message saying that he has a question for [place]. He was seen yesterday.. Please give him a call at [phone]

## 2021-01-05 NOTE — Telephone Encounter (Signed)
Spoke to patient and clarified viibryd instructions

## 2021-01-05 NOTE — Progress Notes (Signed)
Duane Hughes [id number] [birth year] 70 y.o.  Subjective:   Patient ID:  Duane Hughes is a 70 y.o. (DOB February 28, 1951) male.  Chief Complaint:  Chief Complaint  Patient presents with   Depression   Anxiety     Depression        Past medical history includes anxiety.   Anxiety    Duane Hughes presents to the office today for follow-up of depression and anxiety. He reports that he had worsening side effects with Cymbalta 60 mg without benefits. He has since decreased and discontinued this. He reports that he has decreased lamictal to 150 mg po qd every other day for about 3 weeks. He reports consistent sad mood. He reports that he has experienced some irritability. He reports motivation has gotten lower. Has to push himself to do part-time job. Energy is lower. Not sleeping as well. Has stopped taking Tylenol PM and started a new OTC sleep gummy. He has difficulty returning to sleep if he awakens to use the bathroom. Appetite has been good. He reports that concentration is "not as good as it used to" be and has difficulty recalling some information. He denies SI.   He reports that he has been more anxious than usual. He reports feeling "nervous."   He reports wife's children have both moved back in. Older stepson has been converting a camper and will be leaving in several months. Younger stepson is going through a separation and is having mental health issues and not following the advice of others. He reports that they have set some ground rules.    Past Psychiatric Medication Trials: Pristiq-ineffective Effexor-effective for 2 years then no longer effective Bupropion-taken for years Cymbalta-Not effective Sertraline- Not effective. Loose stools. Celexa Trintellix-Ineffective Abilify-not effective at 5 mg and increased emotional lability at 10 mg Lamictal Lithium Pramipexole- effective for mood signs and symptoms Trazodone  GAD-7    Flowsheet Row Office Visit from  09/16/2020 in [facility] Office Visit from 11/04/2019 in [facility]  Total GAD-7 Score 1 0      PHQ2-9    [place] Office Visit from 09/16/2020 in [place] from 05/20/2020 in [facility] Office Visit from 11/04/2019 in [facility] Office Visit from 09/18/2019 in [facility] Office Visit from 09/04/2019 in [facility]  PHQ-2 Total Score 1 0 0 0 0  PHQ-9 Total Score 3 -- 0 0 0        Review of Systems:  Review of Systems  Gastrointestinal:        Loose stools, frequent BM's  Musculoskeletal:  Negative for gait problem.  Neurological:  Negative for tremors.  Psychiatric/Behavioral:  Positive for depression.        Please refer to HPI   Has transitioned care to a new PCP  Medications: I have reviewed the patient's current medications.  Current Outpatient Medications  Medication Sig Dispense Refill   aspirin EC 81 MG tablet Take 81 mg by mouth daily. Swallow whole.     atorvastatin (LIPITOR) 10 MG tablet Take 1 tablet (10 mg total) by mouth at bedtime. 90 tablet 3   buPROPion (WELLBUTRIN SR) 150 MG 12 hr tablet TAKE 1 TABLET (150 MG TOTAL) BY MOUTH 2 (TWO) TIMES DAILY. 180 tablet 3   Cholecalciferol (VITAMIN D) 50 MCG (2000 UT) tablet Take 2,000 Units by mouth daily.     Cyanocobalamin (B-12) 1000 MCG SUBL Place  under the tongue.     ferrous sulfate 325 (65 FE) MG EC tablet Take 1 tablet (325 mg total) by mouth 3 (three) times a week. 36 tablet 3   lisinopril (ZESTRIL) 5 MG tablet Take 0.5 tablets (2.5 mg total) by mouth daily. 45 tablet 3   Melaton-Thean-Cham-PassF-LBalm (SLEEP PO) Take by mouth.     metFORMIN (GLUCOPHAGE) 500 MG tablet Take 2 tablets with breakfast and 1 tablet with dinner daily. 270 tablet 3   Omega-3 Fatty Acids (FISH OIL) 1000 MG CAPS Take 1,000 mg by mouth daily.      Vilazodone HCl (VIIBRYD STARTER PACK)  10 & 20 MG KIT Take 5 mg by mouth daily with breakfast for 7 days, THEN 10 mg daily with breakfast for 7 days, THEN 20 mg daily with breakfast for 14 days. 2 kit 0   Vilazodone HCl 20 MG TABS Take 1 tablet (20 mg total) by mouth daily with breakfast. 30 tablet 1   baclofen (LIORESAL) 10 MG tablet Take 0.5-1 tablets (5-10 mg total) by mouth 3 (three) times daily as needed for muscle spasms. (Patient not taking: Reported on 01/04/2021) 30 each 0   diclofenac Sodium (VOLTAREN) 1 % GEL Apply 2 g topically 4 (four) times daily as needed (for chronic pain management). 100 g 5   hydrocortisone 2.5 % cream Apply topically daily. 3 times per week 30 g 11   ketoconazole (NIZORAL) 2 % cream Apply 1 application topically daily. 3 times per week 60 g 11   pramipexole (MIRAPEX) 0.5 MG tablet Take 1 tablet (0.5 mg total) by mouth 2 (two) times daily. 180 tablet 3   sildenafil (REVATIO) 20 MG tablet TAKE 3-5 TABLETS 2 HOURS BEFORE INTERCOURSE ON AN EMPTY STOMACH DO NOT TAKE WITH NITRATES 50 tablet 3   No current facility-administered medications for this visit.    Medication Side Effects: Duane Hughes [id number] 06-09-1950 70 y.o. Objective:   Physical Exam:  There were no vitals taken for this visit.  Physical Exam Constitutional:      General: He is not in acute distress. Musculoskeletal:        General: No deformity.  Neurological:     Mental Status: He is alert and oriented to person, place, and time.     Coordination: Coordination normal.  Psychiatric:        Attention and Perception: Attention and perception normal. He does not perceive auditory or visual hallucinations.        Mood and Affect: Mood is anxious and depressed. Affect is not labile, blunt, angry or inappropriate.        Speech: Speech normal.        Behavior: Behavior normal.        Thought Content: Thought content normal. Thought content is not paranoid or delusional. Thought content does not include homicidal or suicidal  ideation. Thought content does not include homicidal or suicidal plan.        Cognition and Memory: Cognition and memory normal.        Judgment: Judgment normal.     Comments: Insight intact  .res Assessment: Plan:    Pt seen for 30 minutes and time spent discussing alternative treatment options to include potential benefits, risks, and side effects of Viibryd. Pt agrees to trial of Viibryd. Will start Vibryd 5 mg po qd for one week, then increase to 10 mg po qd for one week, then increase to 20 mg po qd for anxiety and depression.  Discussed that  Lamictal could be discontinued since he has been reducing it on his own and has been taking it every other day for several weeks.  Continue Pramipexole for RLS and off label indication for depression.  Continue Wellbutrin SR 150 mg po q am for depression.  Pt to follow-up in 4-6 weeks or sooner if clinically indicated.  Patient advised to contact office with any questions, adverse effects, or acute worsening in signs and symptoms.   Duane Hughes was seen today for depression and anxiety.  Diagnoses and all orders for this visit:  Chronic depressive disorder -     Vilazodone HCl (VIIBRYD STARTER PACK) 10 & 20 MG KIT; Take 5 mg by mouth daily with breakfast for 7 days, THEN 10 mg daily with breakfast for 7 days, THEN 20 mg daily with breakfast for 14 days. -     Vilazodone HCl 20 MG TABS; Take 1 tablet (20 mg total) by mouth daily with breakfast.    Please see After Visit Summary for patient specific instructions.  Future Appointments  Date Time Provider [place]  01/18/2021  2:15 PM Felipa Furnace, DPM [facility]  02/15/2021  2:30 PM Thayer Headings, PMHNP CP-CP None  03/03/2021  3:00 PM [facility] None  03/10/2021  3:00 PM McGowan, Hunt Oris, PA-C [facility] None  05/24/2021  2:50 PM [place] ADVISOR [place] [facility]  09/14/2021  3:45 PM Ralene Bathe, MD ASC-[facility] None  10/27/2021  7:30 AM [facility] VASC 1 [facility] None   10/27/2021  8:30 AM Schnier, Dolores Lory, MD [facility] None    No orders of the defined types were placed in this encounter.   -------------------------------

## 2021-01-13 ENCOUNTER — Telehealth: Payer: Self-pay

## 2021-01-18 ENCOUNTER — Ambulatory Visit: Payer: Medicare HMO | Admitting: Podiatry

## 2021-01-18 ENCOUNTER — Encounter: Payer: Self-pay | Admitting: Podiatry

## 2021-01-18 ENCOUNTER — Other Ambulatory Visit: Payer: Self-pay

## 2021-01-18 DIAGNOSIS — E1169 Type 2 diabetes mellitus with other specified complication: Secondary | ICD-10-CM | POA: Diagnosis not present

## 2021-01-18 DIAGNOSIS — B351 Tinea unguium: Secondary | ICD-10-CM | POA: Diagnosis not present

## 2021-01-18 DIAGNOSIS — E669 Obesity, unspecified: Secondary | ICD-10-CM

## 2021-01-18 DIAGNOSIS — M79675 Pain in left toe(s): Secondary | ICD-10-CM

## 2021-01-18 DIAGNOSIS — M79674 Pain in right toe(s): Secondary | ICD-10-CM

## 2021-01-18 NOTE — Progress Notes (Signed)
This patient returns to my office for at risk foot care.  This patient requires this care by a professional since this patient will be at risk due to having diabetes.  This patient is unable to cut nails himself since the patient cannot reach his nails.These nails are painful walking and wearing shoes.  This patient presents for at risk foot care today.  Patient presents with secondary complaint today of dry skin.  He would like to discuss treatment options he does not moisturize his foot.  He denies any other acute complaints.  General Appearance  Alert, conversant and in no acute stress.  Vascular  Dorsalis pedis and posterior tibial  pulses are palpable  bilaterally.  Capillary return is within normal limits  bilaterally. Temperature is within normal limits  bilaterally.  Neurologic  Senn-Weinstein monofilament wire test within normal limits  bilaterally. Muscle power within normal limits bilaterally.  Nails Thick disfigured discolored nails with subungual debris  from hallux to fifth toes bilaterally. No evidence of bacterial infection or drainage bilaterally.  Orthopedic  No limitations of motion  feet .  No crepitus or effusions noted.  No bony pathology or digital deformities noted.  Skin  normotropic skin with no porokeratosis noted bilaterally.  No signs of infections or ulcers noted.   Dry skin noted to both lower extremity.  Onychomycosis  Pain in right toes  Pain in left toes  Consent was obtained for treatment procedures.   Mechanical debridement of nails 1-5  bilaterally performed with a nail nipper.  Filed with dremel without incident.    Return office visit   10 weeks                   Told patient to return for periodic foot care and evaluation due to potential at risk complications.  Xerosis skin -Clinically improving    Mushka Laconte D.P.M. 

## 2021-01-20 ENCOUNTER — Ambulatory Visit: Payer: Medicare HMO | Admitting: Podiatry

## 2021-02-03 ENCOUNTER — Ambulatory Visit: Payer: Medicare HMO | Admitting: Psychiatry

## 2021-02-15 ENCOUNTER — Other Ambulatory Visit: Payer: Self-pay

## 2021-02-15 ENCOUNTER — Encounter: Payer: Self-pay | Admitting: Psychiatry

## 2021-02-15 ENCOUNTER — Ambulatory Visit: Payer: Medicare HMO | Admitting: Psychiatry

## 2021-02-15 DIAGNOSIS — F32A Depression, unspecified: Secondary | ICD-10-CM

## 2021-02-15 MED ORDER — PRAMIPEXOLE DIHYDROCHLORIDE 0.75 MG PO TABS: 0.7500 mg | ORAL_TABLET | Freq: Two times a day (BID) | ORAL | 1 refills | Status: DC

## 2021-02-15 MED ORDER — VILAZODONE HCL 40 MG PO TABS: 40.0000 mg | ORAL_TABLET | Freq: Every day | ORAL | 1 refills | Status: DC

## 2021-02-15 NOTE — Progress Notes (Signed)
Duane Hughes [id number] Jul 02, 1950 70 y.o.  Subjective:   Patient ID:  Duane Hughes is a 69 y.o. (DOB 31-Dec-1950) male.  Chief Complaint:  Chief Complaint  Patient presents with   Depression    HPI Duane Hughes presents to the office today for follow-up of depression. Duane Hughes reports sad mood. Duane Hughes reports that Duane Hughes has had some irritability and "lost my cool a couple of times." Denies any significant anxiety. Notices more negative thinking about himself. "I just feel discouraged." Energy is sometimes low. Duane Hughes reports that at times Duane Hughes is physically fatigued. Duane Hughes reports some days Duane Hughes physically feels ok and other days not as good. Occ notices a brief "boost" in motivation. Not sleeping well and difficulty returning to sleep after middle to the night awakenings. Appetite has been ok. Some diminished concentration. Denies SI.   Duane Hughes reports that Duane Hughes is not sure about benefit of Viibryd. Duane Hughes has also tried CBD gummies.   Enjoyed time with his sons and wife in [place] and that it is "a let down" when that is over.   Duane Hughes has been trying to earn some extra money. Has been Door Dashing. Has been buying some things Duane Hughes would normally not buy to try to lift his mood.   One of his step-son's just moved out and the other just traveled to [place].   Past Psychiatric Medication Trials: Pristiq-ineffective Effexor-effective for 2 years then no longer effective Bupropion-taken for years Cymbalta-Not effective Sertraline- Not effective. Loose stools. Celexa Trintellix-Ineffective Abilify-not effective at 5 mg and increased emotional lability at 10 mg Lamictal Lithium Pramipexole- effective for mood signs and symptoms Trazodone  GAD-7    Flowsheet Row Office Visit from 09/16/2020 in [facility] Office Visit from 11/04/2019 in [facility]  Total GAD-7 Score 1 0      PHQ2-9    [facility] Office Visit from 09/16/2020 in [place] from 05/20/2020 in [facility] Office Visit from 11/04/2019 in [facility] Office Visit from 09/18/2019 in [facility] Office Visit from 09/04/2019 in [facility]  PHQ-2 Total Score 1 0 0 0 0  PHQ-9 Total Score 3 -- 0 0 0        Review of Systems:  Review of Systems  Musculoskeletal:  Negative for gait problem.  Neurological:  Negative for tremors.  Psychiatric/Behavioral:         Please refer to HPI   Medications: I have reviewed the patient's current medications.  Current Outpatient Medications  Medication Sig Dispense Refill   Melaton-Thean-Cham-PassF-LBalm (SLEEP PO) Take by mouth.     Vilazodone HCl (VIIBRYD) 40 MG TABS Take 1 tablet (40 mg total) by mouth daily. 30 tablet 1   aspirin EC 81 MG tablet Take 81 mg by mouth daily. Swallow whole.     atorvastatin (LIPITOR) 10 MG tablet Take 1 tablet (10 mg total) by mouth at bedtime. 90 tablet 3   baclofen (LIORESAL) 10 MG tablet Take 0.5-1 tablets (5-10 mg total) by mouth 3 (three) times daily as needed for muscle spasms. (Patient not taking: Reported on 01/04/2021) 30 each 0   buPROPion (WELLBUTRIN SR) 150 MG 12 hr tablet TAKE 1 TABLET (150 MG TOTAL) BY MOUTH 2 (TWO) TIMES DAILY. 180 tablet 3   Cholecalciferol (VITAMIN D) 50 MCG (2000 UT) tablet Take 2,000 Units by mouth daily.     Cyanocobalamin (B-12) 1000 MCG SUBL Place  under the tongue.     diclofenac Sodium (VOLTAREN) 1 % GEL Apply 2 g topically 4 (four) times daily as needed (for chronic pain management). 100 g 5   ferrous sulfate 325 (65 FE) MG EC tablet Take 1 tablet (325 mg total) by mouth 3 (three) times a week. 36 tablet 3   hydrocortisone 2.5 % cream Apply topically daily. 3 times per week 30 g 11   ketoconazole (NIZORAL) 2 % cream Apply 1 application topically daily. 3 times per week 60 g 11   lisinopril (ZESTRIL) 5 MG tablet Take 0.5 tablets (2.5 mg total) by mouth daily. 45 tablet 3    metFORMIN (GLUCOPHAGE) 500 MG tablet Take 2 tablets with breakfast and 1 tablet with dinner daily. 270 tablet 3   Omega-3 Fatty Acids (FISH OIL) 1000 MG CAPS Take 1,000 mg by mouth daily.      pramipexole (MIRAPEX) 0.75 MG tablet Take 1 tablet (0.75 mg total) by mouth 2 (two) times daily. 60 tablet 1   sildenafil (REVATIO) 20 MG tablet TAKE 3-5 TABLETS 2 HOURS BEFORE INTERCOURSE ON AN EMPTY STOMACH DO NOT TAKE WITH NITRATES 50 tablet 3   No current facility-administered medications for this visit.    Medication Side Effects: Other: Loose stools  Allergies: No Known Allergies  Past Medical History:  Diagnosis Date   Anemia    Anxiety    Dr.Indie Boehne . well controlled   Arthritis    back, hands, knees   Colon polyp 05/09/2016   TUBULAR ADENOMAS    Depression    Diabetes mellitus without complication ([place])    type 2   Ectatic abdominal aorta ([place]) 04/18/2016   2.9 cm on [place] 2016, unchanged 2017   History of hiatal hernia    20 years ago   History of skin cancer    hand   Hyperlipidemia    Hypertension 2015   Dr. Sanda Klein   Morbid obesity ([place]) 11/11/2014   Neuropathy    Obesity 11/11/2014   Sleep apnea    cpap   Squamous cell carcinoma of skin 04/11/2018   SCC In Situ R dorsum wrist   Squamous cell carcinoma of skin 06/23/2015   R dorsum hand    Past Medical History, Surgical history, Social history, and Family history were reviewed and updated as appropriate.   Please see review of systems for further details on the patient's review from today.   Objective:   Physical Exam:  There were no vitals taken for this visit.  Physical Exam Constitutional:      General: Duane Hughes is not in acute distress. Musculoskeletal:        General: No deformity.  Neurological:     Mental Status: Duane Hughes is alert and oriented to person, place, and time.     Coordination: Coordination normal.  Psychiatric:        Attention and Perception: Attention and perception normal. Duane Hughes does not  perceive auditory or visual hallucinations.        Mood and Affect: Mood is depressed. Mood is not anxious. Affect is not labile, blunt, angry or inappropriate.        Speech: Speech normal.        Behavior: Behavior normal. Behavior is cooperative.        Thought Content: Thought content normal. Thought content is not paranoid or delusional. Thought content does not include homicidal or suicidal ideation. Thought content does not include homicidal or suicidal plan.  Cognition and Memory: Cognition and memory normal.        Judgment: Judgment normal.     Comments: Insight intact    Lab Review:     Component Value Date/Time   NA 140 06/07/2020 0812   NA 139 05/12/2015 1537   K 4.3 06/07/2020 0812   CL 108 06/07/2020 0812   CO2 23 06/07/2020 0812   GLUCOSE 105 (H) 06/07/2020 0812   BUN 15 06/07/2020 0812   BUN 15 05/12/2015 1537   CREATININE 0.90 06/07/2020 0812   CALCIUM 8.4 (L) 06/07/2020 0812   PROT 5.9 (L) 06/07/2020 0812   PROT 6.2 03/19/2015 0825   ALBUMIN 4.1 09/19/2016 1400   ALBUMIN 4.0 03/19/2015 0825   AST 26 06/07/2020 0812   ALT 30 06/07/2020 0812   ALKPHOS 46 09/19/2016 1400   BILITOT 0.2 06/07/2020 0812   BILITOT 0.5 03/19/2015 0825   GFRNONAA 86 06/07/2020 0812   GFRAA 100 06/07/2020 0812       Component Value Date/Time   WBC 5.2 06/07/2020 0812   RBC 4.63 06/07/2020 0812   HGB 14.5 06/07/2020 0812   HGB 14.8 06/20/2016 1458   HCT 42.6 06/07/2020 0812   HCT 43.6 06/20/2016 1458   PLT 250 06/07/2020 0812   PLT 260 06/20/2016 1458   MCV 92.0 06/07/2020 0812   MCV 93 06/20/2016 1458   MCH 31.3 06/07/2020 0812   MCHC 34.0 06/07/2020 0812   RDW 13.6 06/07/2020 0812   RDW 13.3 06/20/2016 1458   LYMPHSABS 1,404 06/07/2020 0812   LYMPHSABS 2.0 06/20/2016 1458   MONOABS 0.4 03/29/2017 1343   EOSABS 312 06/07/2020 0812   EOSABS 0.4 06/20/2016 1458   BASOSABS 52 06/07/2020 0812   BASOSABS 0.1 06/20/2016 1458    No results found for: POCLITH,  LITHIUM   No results found for: PHENYTOIN, PHENOBARB, VALPROATE, CBMZ   .res Assessment: Plan:   Patient seen for 30 minutes and time spent discussing options for treatment resistant depression, to include [place] and [place].  Case staffed with Dr. Clovis Pu.  Discussed that pramipexole is used off label for depression and had previously been helpful for past depressive episode.  Discussed that Duane Hughes is currently on a lower dose compared to what is typically helpful for treatment resistant depression.  Discussed attempting increase in dose of pramipexole to determine if this dose will be more effective. Increase Pramipexole 0.75 mg po BID to improve depression. Consider TID dosing if sedation occurs.  Will increase Viibryd to 40 mg daily to potentially improve depression. May consider nortriptyline if depression does not improve with other treatment options. Patient follow-up in 4 weeks or sooner if clinically indicated. Patient advised to contact office with any questions, adverse effects, or acute worsening in signs and symptoms.    Javarion was seen today for depression.  Diagnoses and all orders for this visit:  Chronic depressive disorder -     Vilazodone HCl (VIIBRYD) 40 MG TABS; Take 1 tablet (40 mg total) by mouth daily. -     pramipexole (MIRAPEX) 0.75 MG tablet; Take 1 tablet (0.75 mg total) by mouth 2 (two) times daily.    Please see After Visit Summary for patient specific instructions.  Future Appointments  Date Time Provider [place]  03/03/2021  3:00 PM [facility] None  03/10/2021  3:00 PM McGowan, Larene Beach A, PA-C [facility] None  03/22/2021  4:00 PM Thayer Headings, PMHNP CP-CP None  04/12/2021  1:15 PM Felipa Furnace, DPM [facility]  05/24/2021  2:50 PM [place] ADVISOR [place] [facility]  09/14/2021  3:45 PM Ralene Bathe, MD ASC-ASC None  10/27/2021  7:30 AM [facility] VASC 1 [facility] None  10/27/2021  8:30 AM Schnier, Dolores Lory, MD [facility] None     No orders of the defined types were placed in this encounter.   -------------------------------

## 2021-03-03 ENCOUNTER — Other Ambulatory Visit: Payer: Medicare HMO

## 2021-03-03 ENCOUNTER — Encounter: Payer: Self-pay | Admitting: Urology

## 2021-03-03 ENCOUNTER — Other Ambulatory Visit: Payer: Self-pay | Admitting: *Deleted

## 2021-03-03 ENCOUNTER — Other Ambulatory Visit
Admission: RE | Admit: 2021-03-03 | Discharge: 2021-03-03 | Disposition: A | Discharge status: Home / Self Care | Payer: Medicare HMO | Source: Ambulatory Visit | Attending: Urology | Admitting: Urology

## 2021-03-03 DIAGNOSIS — N138 Other obstructive and reflux uropathy: Secondary | ICD-10-CM

## 2021-03-03 DIAGNOSIS — N401 Enlarged prostate with lower urinary tract symptoms: Secondary | ICD-10-CM | POA: Insufficient documentation

## 2021-03-03 LAB — PSA: Prostatic Specific Antigen: 0.19 ng/mL (ref 0.00–4.00)

## 2021-03-09 NOTE — Progress Notes (Signed)
1:26 PM   Duane Hughes [birth year] [id number]  Referring provider: Delsa Grana, PA-C [address] [place],  [place] [address]  Chief Complaint  Patient presents with   Benign Prostatic Hypertrophy    1year w/IPSS&SHIM    Urological history: 1. ED -contributing factors of age, BPH, DM, HTN, HLD, sleep apnea, depression, antidepressants, pain medication and blood pressure medications -SHIM 16 -managed with sildenafil 20 mg, on-demand-dosing  2. BPH with LU TS -PSA 0.19 in 03/2021 -I PSS 10/2   HPI: Duane Hughes is  a 70 y.o. male who presents for a yearly visit.    Patient is not having spontaneous erections.  He denies any pain or curvature with erections.  He is having good results with the sildenafil.     SHIM     Row Name 03/10/21 1514         SHIM: Over the last 6 months:   How do you rate your confidence that you could get and keep an erection? Low     When you had erections with sexual stimulation, how often were your erections hard enough for penetration (entering your partner)? Most Times (much more than half the time)     During sexual intercourse, how often were you able to maintain your erection after you had penetrated (entered) your partner? Most Times (much more than half the time)     During sexual intercourse, how difficult was it to maintain your erection to completion of intercourse? Difficult     When you attempted sexual intercourse, how often was it satisfactory for you? Sometimes (about half the time)       SHIM Total Score   SHIM 16               Score: 1-7 Severe ED 8-11 Moderate ED 12-16 Mild-Moderate ED 17-21 Mild ED 22-25 No ED  He is experiencing urinary frequency.  Patient denies any modifying or aggravating factors.  Patient denies any gross hematuria, dysuria or suprapubic/flank pain.  Patient denies any fevers, chills, nausea or vomiting.     IPSS     Row Name 03/10/21 1500         International  Prostate Symptom Score   How often have you had the sensation of not emptying your bladder? About half the time     How often have you had to urinate less than every two hours? Less than 1 in 5 times     How often have you found you stopped and started again several times when you urinated? Less than 1 in 5 times     How often have you found it difficult to postpone urination? Not at All     How often have you had a weak urinary stream? More than half the time     How often have you had to strain to start urination? Not at All     How many times did you typically get up at night to urinate? 1 Time     Total IPSS Score 10       Quality of Life due to urinary symptoms   If you were to spend the rest of your life with your urinary condition just the way it is now how would you feel about that? Mostly Satisfied               Score:  1-7 Mild 8-19 Moderate 20-35 Severe   PMH: Past Medical History:  Diagnosis  Date   Anemia    Anxiety    Dr.Jessica Eulas [place] . well controlled   Arthritis    back, hands, knees   Colon polyp 05/09/2016   TUBULAR ADENOMAS    Depression    Diabetes mellitus without complication ([place])    type 2   Ectatic abdominal aorta ([place]) 04/18/2016   2.9 cm on [place] 2016, unchanged 2017   History of hiatal hernia    20 years ago   History of skin cancer    hand   Hyperlipidemia    Hypertension 2015   Dr. Sanda Klein   Morbid obesity ([place]) 11/11/2014   Neuropathy    Obesity 11/11/2014   Sleep apnea    cpap   Squamous cell carcinoma of skin 04/11/2018   SCC In Situ R dorsum wrist   Squamous cell carcinoma of skin 06/23/2015   R dorsum hand    Surgical History: Past Surgical History:  Procedure Laterality Date   COLONOSCOPY WITH PROPOFOL N/A 05/09/2016   Procedure: COLONOSCOPY WITH PROPOFOL;  Surgeon: Lucilla Lame, MD;  Location: [facility] ENDOSCOPY;  Service: Endoscopy;  Laterality: N/A;   HERNIA REPAIR     Hiatal   JOINT REPLACEMENT     04-04-17 Dr. Gladstone Lighter   Right  knee   KNEE ARTHROSCOPY     several scopes done   KNEE CLOSED REDUCTION Right 05/31/2017   Procedure: CLOSED MANIPULATION RIGHT KNEE;  Surgeon: Latanya Maudlin, MD;  Location: [facility];  Service: Orthopedics;  Laterality: Right;  Femoral Block   LUMBAR LAMINECTOMY/DECOMPRESSION MICRODISCECTOMY N/A 01/26/2020   Procedure: L3, L4 LAMINECTOMIES;  Surgeon: Deetta Perla, MD;  Location: [facility] ORS;  Service: Neurosurgery;  Laterality: N/A;   MEDIAL PARTIAL KNEE REPLACEMENT Left    2000s?   SKIN CANCER EXCISION  Feb 2017   removed from right hand   TOTAL KNEE ARTHROPLASTY Right 04/04/2017   Procedure: RIGHT TOTAL KNEE ARTHROPLASTY;  Surgeon: Latanya Maudlin, MD;  Location: [facility];  Service: Orthopedics;  Laterality: Right;   TOTAL KNEE ARTHROPLASTY Left 09/24/2018   Procedure: REVISION LEFT KNEE ARTHROPLASTY;  Surgeon: Paralee Cancel, MD;  Location: [facility];  Service: Orthopedics;  Laterality: Left;  70 mins    Home Medications:  Allergies as of 03/10/2021   No Known Allergies      Medication List        Accurate as of March 10, 2021 11:59 PM. If you have any questions, ask your nurse or doctor.          STOP taking these medications    baclofen 10 MG tablet Commonly known as: LIORESAL Stopped by: Londa Mackowski, PA-C   diclofenac Sodium 1 % Gel Commonly known as: VOLTAREN Stopped by: Emree Locicero, PA-C       TAKE these medications    aspirin EC 81 MG tablet Take 81 mg by mouth daily. Swallow whole.   atorvastatin 10 MG tablet Commonly known as: LIPITOR Take 1 tablet (10 mg total) by mouth at bedtime.   B-12 1000 MCG Subl Place under the tongue.   buPROPion 150 MG 12 hr tablet Commonly known as: WELLBUTRIN SR TAKE 1 TABLET (150 MG TOTAL) BY MOUTH 2 (TWO) TIMES DAILY.   ferrous sulfate 325 (65 FE) MG EC tablet Take 1 tablet (325 mg total) by mouth 3 (three) times a week.   Fish Oil 1000 MG Caps Take 1,000 mg by mouth daily.   hydrocortisone 2.5 % cream Apply  topically daily. 3 times per week   ketoconazole 2 %  cream Commonly known as: NIZORAL Apply 1 application topically daily. 3 times per week   lisinopril 5 MG tablet Commonly known as: ZESTRIL Take 0.5 tablets (2.5 mg total) by mouth daily.   metFORMIN 500 MG tablet Commonly known as: GLUCOPHAGE Take 2 tablets with breakfast and 1 tablet with dinner daily.   pramipexole 0.75 MG tablet Commonly known as: MIRAPEX Take 1 tablet (0.75 mg total) by mouth 2 (two) times daily.   sildenafil 20 MG tablet Commonly known as: REVATIO TAKE 3-5 TABLETS 2 HOURS BEFORE INTERCOURSE ON AN EMPTY STOMACH DO NOT TAKE WITH NITRATES   SLEEP PO Take by mouth.   Vilazodone HCl 40 MG Tabs Commonly known as: Viibryd Take 1 tablet (40 mg total) by mouth daily.   Vitamin D 50 MCG (2000 UT) tablet Take 2,000 Units by mouth daily.        Allergies: No Known Allergies  Family History: Family History  Problem Relation Age of Onset   Alcohol abuse Mother    Rheum arthritis Mother    Stroke Maternal Grandmother    Stroke Paternal Grandfather    Dementia Paternal Grandmother    Cancer Neg Hx    Diabetes Neg Hx    Heart disease Neg Hx    Hypertension Neg Hx    Kidney disease Neg Hx    Prostate cancer Neg Hx     Social History:  reports that he has never smoked. He has never used smokeless tobacco. He reports that he does not drink alcohol and does not use drugs.  ROS: For pertinent review of systems please refer to history of present illness  Physical Exam: BP (!) 157/79   Pulse 77   Ht 5\' 8"  (1.727 m)   Wt 269 lb (122 kg)   BMI 40.90 kg/m   Constitutional:  Well nourished. Alert and oriented, No acute distress. HEENT: [place] AT, mask in place.  Trachea midline Cardiovascular: No clubbing, cyanosis, or edema. Respiratory: Normal respiratory effort, no increased work of breathing. GU: No CVA tenderness.  No bladder fullness or masses.  Patient with circumcised phallus.   Urethral meatus is  patent.  No penile discharge. No penile lesions or rashes. Scrotum without lesions, cysts, rashes and/or edema.  Testicles are located scrotally bilaterally. No masses are appreciated in the testicles. Left and right epididymis are normal. Rectal: Patient with  normal sphincter tone. Anus and perineum without scarring or rashes. No rectal masses are appreciated. Prostate is approximately 50 + grams, could only palpate the apex, no nodules are appreciated. Seminal vesicles could not be palpated Neurologic: Grossly intact, no focal deficits, moving all 4 extremities. Psychiatric: Normal mood and affect.   Laboratory Data: Component     Latest Ref Rng & Units 03/03/2021  Prostatic Specific Antigen     0.00 - 4.00 ng/mL 0.19    Lab Results  Component Value Date   WBC 5.2 06/07/2020   HGB 14.5 06/07/2020   HCT 42.6 06/07/2020   MCV 92.0 06/07/2020   PLT 250 06/07/2020    Lab Results  Component Value Date   CREATININE 0.90 06/07/2020    Lab Results  Component Value Date   HGBA1C 6.9 (H) 06/07/2020    Lab Results  Component Value Date   AST 26 06/07/2020   Lab Results  Component Value Date   ALT 30 06/07/2020  I have reviewed the labs.  Assessment & Plan:    1. Erectile dysfunction -continue sildenafil  2. BPH with LUTS -PSA stable -  DRE benign -continue conservative management, avoiding bladder irritants and timed voiding's    Return in about 1 year (around 03/10/2022) for IPSS, SHIM, PSA and exam.  These notes generated with voice recognition software. I apologize for typographical errors.  Zara Council, PA-C  [facility] [address] [place], [place] [address] [phone]

## 2021-03-10 ENCOUNTER — Other Ambulatory Visit: Payer: Self-pay

## 2021-03-10 ENCOUNTER — Ambulatory Visit: Payer: Medicare HMO | Admitting: Urology

## 2021-03-10 ENCOUNTER — Encounter: Payer: Self-pay | Admitting: Urology

## 2021-03-10 VITALS — BP 157/79 | HR 77 | Ht 68.0 in | Wt 269.0 lb

## 2021-03-10 DIAGNOSIS — N529 Male erectile dysfunction, unspecified: Secondary | ICD-10-CM | POA: Diagnosis not present

## 2021-03-10 DIAGNOSIS — N138 Other obstructive and reflux uropathy: Secondary | ICD-10-CM

## 2021-03-10 DIAGNOSIS — N401 Enlarged prostate with lower urinary tract symptoms: Secondary | ICD-10-CM

## 2021-03-22 ENCOUNTER — Ambulatory Visit (INDEPENDENT_AMBULATORY_CARE_PROVIDER_SITE_OTHER): Payer: Medicare HMO | Admitting: Psychiatry

## 2021-03-22 ENCOUNTER — Encounter: Payer: Self-pay | Admitting: Psychiatry

## 2021-03-22 DIAGNOSIS — F32A Depression, unspecified: Secondary | ICD-10-CM

## 2021-03-22 MED ORDER — PRAMIPEXOLE DIHYDROCHLORIDE 0.75 MG PO TABS: 0.7500 mg | ORAL_TABLET | Freq: Two times a day (BID) | ORAL | 2 refills | Status: DC

## 2021-03-22 MED ORDER — VILAZODONE HCL 40 MG PO TABS: 40.0000 mg | ORAL_TABLET | Freq: Every day | ORAL | 2 refills | Status: DC

## 2021-03-22 NOTE — Progress Notes (Signed)
Duane Hughes [id number] February 21, 1951 70 y.o.  Virtual Visit via Telephone Note  I connected with pt on 03/22/21 at  4:00 PM EST by telephone and verified that I am speaking with the correct person using two identifiers.   I discussed the limitations, risks, security and privacy concerns of performing an evaluation and management service by telephone and the availability of in person appointments. I also discussed with the patient that there may be a patient responsible charge related to this service. The patient expressed understanding and agreed to proceed.   I discussed the assessment and treatment plan with the patient. The patient was provided an opportunity to ask questions and all were answered. The patient agreed with the plan and demonstrated an understanding of the instructions.   The patient was advised to call back or seek an in-person evaluation if the symptoms worsen or if the condition fails to improve as anticipated.  I provided 21 minutes of non-face-to-face time during this encounter.  The patient was located at home.  The provider was located at [place].   Duane Hughes, PMHNP   Subjective:   Patient ID:  Duane Hughes is a 70 y.o. (DOB 08-20-1950) male.  Chief Complaint:  Chief Complaint  Patient presents with   Depression   Anxiety    Depression        Past medical history includes anxiety.   Anxiety Patient reports no nausea.    Duane Hughes presents for follow-up of depression and anxiety. He reports that Viibryd has caused some GI side effects. He reports that depression has improved. "It's not as bad as it was." He reports that depression no longer seems to be persistent. He reports that depression will periodically worsen without apparent trigger. He reports that at times he feels anxious. Irritability has improved. Energy has been "not bad." He reports that he occasionally has trouble getting going for Door [employer]. No change in  sleep. Awakens 4-5:30 am most days. He reports that he has been doing different activities. Denies anhedonia. Appetite has been good. Concentration has been ok. Denies SI.   "I think we just stick with what we are doing for right now."   Going to mother-in-law's for Thanksgiving.   Past Psychiatric Medication Trials: Pristiq-ineffective Effexor-effective for 2 years then no longer effective Bupropion-taken for years Cymbalta-Not effective Sertraline- Not effective. Loose stools. Celexa Trintellix-Ineffective Abilify-not effective at 5 mg and increased emotional lability at 10 mg Lamictal Lithium Pramipexole- effective for mood signs and symptoms Trazodone     Review of Systems:  Review of Systems  HENT:  Positive for congestion.   Gastrointestinal:  Negative for nausea.       Some loose stools  Musculoskeletal:  Negative for gait problem.  Psychiatric/Behavioral:  Positive for depression.        Please refer to HPI   Medications: I have reviewed the patient's current medications.  Current Outpatient Medications  Medication Sig Dispense Refill   celecoxib (CELEBREX) 100 MG capsule Take 100 mg by mouth daily as needed.     pseudoephedrine (SUDAFED) 30 MG tablet Take 30 mg by mouth every 4 (four) hours as needed for congestion.     aspirin EC 81 MG tablet Take 81 mg by mouth daily. Swallow whole.     atorvastatin (LIPITOR) 10 MG tablet Take 1 tablet (10 mg total) by mouth at bedtime. 90 tablet 3   buPROPion (WELLBUTRIN SR) 150 MG 12 hr tablet TAKE 1 TABLET (150 MG TOTAL)  BY MOUTH 2 (TWO) TIMES DAILY. 180 tablet 3   Cholecalciferol (VITAMIN D) 50 MCG (2000 UT) tablet Take 2,000 Units by mouth daily.     Cyanocobalamin (B-12) 1000 MCG SUBL Place under the tongue.     ferrous sulfate 325 (65 FE) MG EC tablet Take 1 tablet (325 mg total) by mouth 3 (three) times a week. 36 tablet 3   hydrocortisone 2.5 % cream Apply topically daily. 3 times per week 30 g 11   ketoconazole  (NIZORAL) 2 % cream Apply 1 application topically daily. 3 times per week 60 g 11   lisinopril (ZESTRIL) 5 MG tablet Take 0.5 tablets (2.5 mg total) by mouth daily. 45 tablet 3   Melaton-Thean-Cham-PassF-LBalm (SLEEP PO) Take by mouth.     metFORMIN (GLUCOPHAGE) 500 MG tablet Take 2 tablets with breakfast and 1 tablet with dinner daily. 270 tablet 3   Omega-3 Fatty Acids (FISH OIL) 1000 MG CAPS Take 1,000 mg by mouth daily.      pramipexole (MIRAPEX) 0.75 MG tablet Take 1 tablet (0.75 mg total) by mouth 2 (two) times daily. 60 tablet 2   sildenafil (REVATIO) 20 MG tablet TAKE 3-5 TABLETS 2 HOURS BEFORE INTERCOURSE ON AN EMPTY STOMACH DO NOT TAKE WITH NITRATES 50 tablet 3   Vilazodone HCl (VIIBRYD) 40 MG TABS Take 1 tablet (40 mg total) by mouth daily. 30 tablet 2   No current facility-administered medications for this visit.    Medication Side Effects: Other: Loose stools  Allergies: No Known Allergies  Past Medical History:  Diagnosis Date   Anemia    Anxiety    Dr.Erhard Senske . well controlled   Arthritis    back, hands, knees   Colon polyp 05/09/2016   TUBULAR ADENOMAS    Depression    Diabetes mellitus without complication ([place])    type 2   Ectatic abdominal aorta (HCC) 04/18/2016   2.9 cm on [place] 2016, unchanged 2017   History of hiatal hernia    20 years ago   History of skin cancer    hand   Hyperlipidemia    Hypertension 2015   Dr. Sanda Klein   Morbid obesity ([place]) 11/11/2014   Neuropathy    Obesity 11/11/2014   Sleep apnea    cpap   Squamous cell carcinoma of skin 04/11/2018   SCC In Situ R dorsum wrist   Squamous cell carcinoma of skin 06/23/2015   R dorsum hand    Family History  Problem Relation Age of Onset   Alcohol abuse Mother    Rheum arthritis Mother    Stroke Maternal Grandmother    Stroke Paternal Grandfather    Dementia Paternal Grandmother    Cancer Neg Hx    Diabetes Neg Hx    Heart disease Neg Hx    Hypertension Neg Hx    Kidney disease Neg  Hx    Prostate cancer Neg Hx     Social History   Socioeconomic History   Marital status: Married    Spouse name: Duane Hughes   Number of children: 6   Years of education: Not on file   Highest education level: Associate degree: academic program  Occupational History    Comment: part time  Tobacco Use   Smoking status: Never   Smokeless tobacco: Never  Vaping Use   Vaping Use: Never used  Substance and Sexual Activity   Alcohol use: No    Alcohol/week: 0.0 standard drinks   Drug use: No  Sexual activity: Yes    Partners: Female  Other Topics Concern   Not on file  Social History Narrative   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   Social Determinants of Health   Financial Resource Strain: Low Risk    Difficulty of Paying Living Expenses: Not hard at all  Food Insecurity: No Food Insecurity   Worried About Charity fundraiser in the Last Year: Never true   Arboriculturist in the Last Year: Never true  Transportation Needs: No Transportation Needs   Lack of Transportation (Medical): No   Lack of Transportation (Non-Medical): No  Physical Activity: Sufficiently Active   Days of Exercise per Week: 7 days   Minutes of Exercise per Session: 60 min  Stress: No Stress Concern Present   Feeling of Stress : Not at all  Social Connections: Socially Integrated   Frequency of Communication with Friends and Family: More than three times a week   Frequency of Social Gatherings with Friends and Family: Three times a week   Attends Religious Services: More than 4 times per year   Active Member of Clubs or Organizations: Yes   Attends Archivist Meetings: 1 to 4 times per year   Marital Status: Married  Human resources officer Violence: Not At Risk   Fear of Current or Ex-Partner: No   Emotionally Abused: No   Physically Abused: No   Sexually Abused: No    Past Medical History, Surgical history, Social history, and Family history were reviewed and updated as  appropriate.   Please see review of systems for further details on the patient's review from today.   Objective:   Physical Exam:  There were no vitals taken for this visit.  Physical Exam Neurological:     Mental Status: He is alert and oriented to person, place, and time.     Cranial Nerves: No dysarthria.  Psychiatric:        Attention and Perception: Attention and perception normal.        Speech: Speech normal.        Behavior: Behavior is cooperative.        Thought Content: Thought content normal. Thought content is not paranoid or delusional. Thought content does not include homicidal or suicidal ideation. Thought content does not include homicidal or suicidal plan.        Cognition and Memory: Cognition and memory normal.        Judgment: Judgment normal.     Comments: Insight intact Mood presents as less depressed compared to last visit. Mildly anxious.    Lab Review:     Component Value Date/Time   NA 140 06/07/2020 0812   NA 139 05/12/2015 1537   K 4.3 06/07/2020 0812   CL 108 06/07/2020 0812   CO2 23 06/07/2020 0812   GLUCOSE 105 (H) 06/07/2020 0812   BUN 15 06/07/2020 0812   BUN 15 05/12/2015 1537   CREATININE 0.90 06/07/2020 0812   CALCIUM 8.4 (L) 06/07/2020 0812   PROT 5.9 (L) 06/07/2020 0812   PROT 6.2 03/19/2015 0825   ALBUMIN 4.1 09/19/2016 1400   ALBUMIN 4.0 03/19/2015 0825   AST 26 06/07/2020 0812   ALT 30 06/07/2020 0812   ALKPHOS 46 09/19/2016 1400   BILITOT 0.2 06/07/2020 0812   BILITOT 0.5 03/19/2015 0825   GFRNONAA 86 06/07/2020 0812   GFRAA 100 06/07/2020 0812       Component Value Date/Time   WBC 5.2  06/07/2020 0812   RBC 4.63 06/07/2020 0812   HGB 14.5 06/07/2020 0812   HGB 14.8 06/20/2016 1458   HCT 42.6 06/07/2020 0812   HCT 43.6 06/20/2016 1458   PLT 250 06/07/2020 0812   PLT 260 06/20/2016 1458   MCV 92.0 06/07/2020 0812   MCV 93 06/20/2016 1458   MCH 31.3 06/07/2020 0812   MCHC 34.0 06/07/2020 0812   RDW 13.6 06/07/2020  0812   RDW 13.3 06/20/2016 1458   LYMPHSABS 1,404 06/07/2020 0812   LYMPHSABS 2.0 06/20/2016 1458   MONOABS 0.4 03/29/2017 1343   EOSABS 312 06/07/2020 0812   EOSABS 0.4 06/20/2016 1458   BASOSABS 52 06/07/2020 0812   BASOSABS 0.1 06/20/2016 1458    No results found for: POCLITH, LITHIUM   No results found for: PHENYTOIN, PHENOBARB, VALPROATE, CBMZ   .res Assessment: Plan:    Pt seen for 21 minutes and time spent reviewing response to increase in Viibryd and increase Pramipexole. He reports that he has noticed some improvement in depressive s/s in response to recent med changes. He reports that he would like to continue current medications through the holidays and then re-evaluate in January. He reports that benefits of medication outweigh side effects.  Continue Viibryd 40 mg po qd for anxiety and depression.  Continue Pramipexole 0.75 mg po BID for depression.  Continue Wellbutrin SR 150 mg po BID for depression.  Pt to follow-up in 2 months or sooner if clinically indicated.  Patient advised to contact office with any questions, adverse effects, or acute worsening in signs and symptoms.   Latif was seen today for depression and anxiety.  Diagnoses and all orders for this visit:  Chronic depressive disorder -     Vilazodone HCl (VIIBRYD) 40 MG TABS; Take 1 tablet (40 mg total) by mouth daily. -     pramipexole (MIRAPEX) 0.75 MG tablet; Take 1 tablet (0.75 mg total) by mouth 2 (two) times daily.   Please see After Visit Summary for patient specific instructions.  Future Appointments  Date Time Provider [place]  04/12/2021  1:15 PM Felipa Furnace, DPM [facility]  05/24/2021  4:00 PM Duane Hughes, PMHNP [facility] None  09/14/2021  3:45 PM Ralene Bathe, MD [facility] None  10/27/2021  7:30 AM [facility] None  10/27/2021  8:30 AM Schnier, Dolores Lory, MD [facility] None  03/09/2022  2:00 PM [facility] None  03/14/2022  3:00 PM McGowan, Larene Beach  A, PA-C [facility] None    No orders of the defined types were placed in this encounter.     -------------------------------

## 2021-04-12 ENCOUNTER — Other Ambulatory Visit: Payer: Self-pay

## 2021-04-12 ENCOUNTER — Ambulatory Visit: Payer: Medicare HMO | Admitting: Podiatry

## 2021-04-12 DIAGNOSIS — E1169 Type 2 diabetes mellitus with other specified complication: Secondary | ICD-10-CM | POA: Diagnosis not present

## 2021-04-12 DIAGNOSIS — B351 Tinea unguium: Secondary | ICD-10-CM

## 2021-04-12 DIAGNOSIS — E669 Obesity, unspecified: Secondary | ICD-10-CM

## 2021-04-12 DIAGNOSIS — M79675 Pain in left toe(s): Secondary | ICD-10-CM | POA: Diagnosis not present

## 2021-04-12 DIAGNOSIS — M79674 Pain in right toe(s): Secondary | ICD-10-CM

## 2021-04-14 ENCOUNTER — Encounter: Payer: Self-pay | Admitting: Podiatry

## 2021-04-14 NOTE — Progress Notes (Signed)
This patient returns to my office for at risk foot care.  This patient requires this care by a professional since this patient will be at risk due to having diabetes.  This patient is unable to cut nails himself since the patient cannot reach his nails.These nails are painful walking and wearing shoes.  This patient presents for at risk foot care today.  Patient presents with secondary complaint today of dry skin.  He would like to discuss treatment options he does not moisturize his foot.  He denies any other acute complaints.  General Appearance  Alert, conversant and in no acute stress.  Vascular  Dorsalis pedis and posterior tibial  pulses are palpable  bilaterally.  Capillary return is within normal limits  bilaterally. Temperature is within normal limits  bilaterally.  Neurologic  Senn-Weinstein monofilament wire test within normal limits  bilaterally. Muscle power within normal limits bilaterally.  Nails Thick disfigured discolored nails with subungual debris  from hallux to fifth toes bilaterally. No evidence of bacterial infection or drainage bilaterally.  Orthopedic  No limitations of motion  feet .  No crepitus or effusions noted.  No bony pathology or digital deformities noted.  Skin  normotropic skin with no porokeratosis noted bilaterally.  No signs of infections or ulcers noted.   Dry skin noted to both lower extremity.  Onychomycosis  Pain in right toes  Pain in left toes  Consent was obtained for treatment procedures.   Mechanical debridement of nails 1-5  bilaterally performed with a nail nipper.  Filed with dremel without incident.    Return office visit   10 weeks                   Told patient to return for periodic foot care and evaluation due to potential at risk complications.  Xerosis skin -Clinically improving    Dorine Duffey D.P.M. 

## 2021-05-24 ENCOUNTER — Ambulatory Visit: Payer: Medicare HMO

## 2021-05-24 ENCOUNTER — Ambulatory Visit (INDEPENDENT_AMBULATORY_CARE_PROVIDER_SITE_OTHER): Payer: Medicare HMO | Admitting: Psychiatry

## 2021-05-24 ENCOUNTER — Encounter: Payer: Self-pay | Admitting: Psychiatry

## 2021-05-24 DIAGNOSIS — F32A Depression, unspecified: Secondary | ICD-10-CM | POA: Diagnosis not present

## 2021-05-24 MED ORDER — VILAZODONE HCL 40 MG PO TABS: 40.0000 mg | ORAL_TABLET | Freq: Every day | ORAL | 2 refills | Status: DC

## 2021-05-24 NOTE — Progress Notes (Signed)
Duane Hughes [id number] 1951/03/11 71 y.o.  Virtual Visit via Telephone Note  I connected with pt on 05/24/21 at  4:00 PM EST by telephone and verified that I am speaking with the correct person using two identifiers.   I discussed the limitations, risks, security and privacy concerns of performing an evaluation and management service by telephone and the availability of in person appointments. I also discussed with the patient that there may be a patient responsible charge related to this service. The patient expressed understanding and agreed to proceed.   I discussed the assessment and treatment plan with the patient. The patient was provided an opportunity to ask questions and all were answered. The patient agreed with the plan and demonstrated an understanding of the instructions.   The patient was advised to call back or seek an in-person evaluation if the symptoms worsen or if the condition fails to improve as anticipated.  I provided 30 minutes of non-face-to-face time during this encounter.  The patient was located at home.  The provider was located at [place].   Duane Hughes, PMHNP   Subjective:   Patient ID:  Duane Hughes is a 71 y.o. (DOB 09-12-1950) male.  Chief Complaint:  Chief Complaint  Patient presents with   Depression    Depression        Associated symptoms include fatigue. Duane Hughes presents for follow-up of depression. He reports that he feels "the same... nothing has really changed." He wonders about stopping medications and then determining new baseline to clarify if he needs medications. He reports that he is having GI side effects with Viibryd. He reports that he has also been having issues sexual function. He reports that he would like to come off medications "as quick as I can without endangering myself."   He reports that he has low mood. He reports that he continues to experience irritability. He reports "there are things I  want to do, but I am not physically able to." He reports that he is not sleeping as well as he used to. Appetite has been unchanged. He reports difficulty with concentration. He reports that he occasionally has passive suicidal thoughts. Denies SI.   Planning a trip in April.   Past Psychiatric Medication Trials: Pristiq-ineffective Effexor-effective for 2 years then no longer effective Bupropion-taken for years Cymbalta-Not effective Sertraline- Not effective. Loose stools. Celexa Trintellix-Ineffective Abilify-not effective at 5 mg and increased emotional lability at 10 mg Lamictal Lithium Pramipexole- effective for mood signs and symptoms Trazodone  Review of Systems:  Review of Systems  Constitutional:  Positive for fatigue.  Gastrointestinal:        Loose stools  Musculoskeletal:  Negative for gait problem.  Neurological:  Positive for weakness.  Psychiatric/Behavioral:  Positive for depression.        Please refer to HPI   Medications: I have reviewed the patient's current medications.  Current Outpatient Medications  Medication Sig Dispense Refill   buPROPion (WELLBUTRIN SR) 150 MG 12 hr tablet TAKE 1 TABLET (150 MG TOTAL) BY MOUTH 2 (TWO) TIMES DAILY. 180 tablet 3   Vilazodone HCl (VIIBRYD) 40 MG TABS Take 1 tablet (40 mg total) by mouth daily. 30 tablet 2   aspirin EC 81 MG tablet Take 81 mg by mouth daily. Swallow whole.     atorvastatin (LIPITOR) 10 MG tablet Take 1 tablet (10 mg total) by mouth at bedtime. 90 tablet 3   celecoxib (CELEBREX) 100 MG capsule Take 100 mg by  mouth daily as needed.     Cholecalciferol (VITAMIN D) 50 MCG (2000 UT) tablet Take 2,000 Units by mouth daily.     Cyanocobalamin (B-12) 1000 MCG SUBL Place under the tongue.     ferrous sulfate 325 (65 FE) MG EC tablet Take 1 tablet (325 mg total) by mouth 3 (three) times a week. 36 tablet 3   hydrocortisone 2.5 % cream Apply topically daily. 3 times per week 30 g 11   ketoconazole (NIZORAL) 2 %  cream Apply 1 application topically daily. 3 times per week 60 g 11   lisinopril (ZESTRIL) 5 MG tablet Take 0.5 tablets (2.5 mg total) by mouth daily. 45 tablet 3   Melaton-Thean-Cham-PassF-LBalm (SLEEP PO) Take by mouth.     metFORMIN (GLUCOPHAGE) 500 MG tablet Take 2 tablets with breakfast and 1 tablet with dinner daily. 270 tablet 3   pramipexole (MIRAPEX) 0.75 MG tablet Take 1 tablet (0.75 mg total) by mouth 2 (two) times daily. 60 tablet 2   pseudoephedrine (SUDAFED) 30 MG tablet Take 30 mg by mouth every 4 (four) hours as needed for congestion.     sildenafil (REVATIO) 20 MG tablet TAKE 3-5 TABLETS 2 HOURS BEFORE INTERCOURSE ON AN EMPTY STOMACH DO NOT TAKE WITH NITRATES 50 tablet 3   No current facility-administered medications for this visit.    Medication Side Effects: Other: Loose stools, possible sexual side effects  Allergies: No Known Allergies  Past Medical History:  Diagnosis Date   Anemia    Anxiety    Dr.Makenzee Choudhry . well controlled   Arthritis    back, hands, knees   Colon polyp 05/09/2016   TUBULAR ADENOMAS    Depression    Diabetes mellitus without complication ([place])    type 2   Ectatic abdominal aorta (HCC) 04/18/2016   2.9 cm on [place] 2016, unchanged 2017   History of hiatal hernia    20 years ago   History of skin cancer    hand   Hyperlipidemia    Hypertension 2015   Dr. Sanda Klein   Morbid obesity ([place]) 11/11/2014   Neuropathy    Obesity 11/11/2014   Sleep apnea    cpap   Squamous cell carcinoma of skin 04/11/2018   SCC In Situ R dorsum wrist   Squamous cell carcinoma of skin 06/23/2015   R dorsum hand    Family History  Problem Relation Age of Onset   Alcohol abuse Mother    Rheum arthritis Mother    Stroke Maternal Grandmother    Stroke Paternal Grandfather    Dementia Paternal Grandmother    Cancer Neg Hx    Diabetes Neg Hx    Heart disease Neg Hx    Hypertension Neg Hx    Kidney disease Neg Hx    Prostate cancer Neg Hx     Social  History   Socioeconomic History   Marital status: Married    Spouse name: Duane Hughes   Number of children: 6   Years of education: Not on file   Highest education level: Associate degree: academic program  Occupational History    Comment: part time  Tobacco Use   Smoking status: Never   Smokeless tobacco: Never  Vaping Use   Vaping Use: Never used  Substance and Sexual Activity   Alcohol use: No    Alcohol/week: 0.0 standard drinks   Drug use: No   Sexual activity: Yes    Partners: Female  Other Topics Concern   Not on  file  Social History Narrative   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   Please see review of systems for further details on the patient's review from today.   Objective:   Physical Exam:  There were no vitals taken for this visit.  Physical Exam Neurological:     Mental Status: He is alert and oriented to person, place, and time.     Cranial Nerves: No dysarthria.  Psychiatric:        Attention and Perception: Attention and perception normal.        Mood and Affect: Mood is depressed.        Speech: Speech normal.        Behavior: Behavior is cooperative.        Thought Content: Thought content normal. Thought content is not paranoid or delusional. Thought content does not include homicidal or suicidal ideation. Thought content does not include homicidal or suicidal plan.        Cognition and Memory: Cognition and memory normal.        Judgment: Judgment normal.     Comments: Insight intact     Lab Review:     Component Value Date/Time   NA 140 06/07/2020 0812   NA 139 05/12/2015 1537   K 4.3 06/07/2020 0812   CL 108 06/07/2020 0812   CO2 23 06/07/2020 0812   GLUCOSE 105 (H) 06/07/2020 0812   BUN 15 06/07/2020 0812   BUN 15 05/12/2015 1537   CREATININE 0.90 06/07/2020 0812   CALCIUM 8.4 (L) 06/07/2020 0812   PROT 5.9 (L) 06/07/2020 0812   PROT 6.2 03/19/2015 0825   ALBUMIN 4.1 09/19/2016 1400   ALBUMIN 4.0 03/19/2015 0825   AST 26 06/07/2020 0812   ALT 30 06/07/2020 0812   ALKPHOS 46 09/19/2016 1400   BILITOT 0.2 06/07/2020 0812   BILITOT 0.5 03/19/2015 0825   GFRNONAA 86 06/07/2020 0812   GFRAA 100 06/07/2020 0812       Component Value Date/Time   WBC 5.2 06/07/2020 0812   RBC 4.63 06/07/2020 0812   HGB 14.5 06/07/2020 0812   HGB 14.8 06/20/2016 1458   HCT 42.6 06/07/2020 0812   HCT 43.6 06/20/2016 1458   PLT 250 06/07/2020 0812   PLT 260 06/20/2016 1458   MCV 92.0 06/07/2020 0812   MCV 93 06/20/2016 1458   MCH 31.3 06/07/2020 0812   MCHC 34.0 06/07/2020 0812   RDW 13.6 06/07/2020 0812   RDW 13.3 06/20/2016 1458   LYMPHSABS 1,404 06/07/2020 0812   LYMPHSABS 2.0 06/20/2016 1458   MONOABS 0.4 03/29/2017 1343   EOSABS 312 06/07/2020 0812   EOSABS 0.4 06/20/2016 1458   BASOSABS 52 06/07/2020 0812   BASOSABS 0.1 06/20/2016 1458    No results found for: POCLITH, LITHIUM   No results found for: PHENYTOIN, PHENOBARB, VALPROATE, CBMZ   .res Assessment: Plan:    Pt seen for 30 minutes and time spent discussing his request to taper off of all psychiatric medications to determine baseline,  eliminate side effects, and then re-evaluate treatment plan. Recommended reducing and eliminating one medication at a time since this may help with evaluating the effect and side effects of each medication. Discussed the following taper schedule: Decrease Viibryd to 40 mg 1/2 tablet daily for one week, then stop.  Next decrease Wellbutrin SR to morning only for 5-7  days, then stop. Next decrease Pramipexole by 1/2 of 0.75 mg tablet twice daily for one day, then 0.25 mg twice daily for one day, then 1/2 of 0.25 mg tablet twice daily for one day, then stop.  Discussed considering [place] treatment for depression since this is an approved treatment for depression and can be helpful for depression signs and symptoms that have had limited improvement with medications. Discussed that he may want to visit [employer] website at [email or web address] to learn more about [place]. Discussed that he could contact [facility] at [phone] if he wishes to schedule a consultation.  Will re-assess in 4-6 weeks or sooner if clinically indicated.  Patient advised to contact office with any questions, adverse effects, or acute worsening in signs and symptoms.  Daquann was seen today for depression.  Diagnoses and all orders for this visit:  Chronic depressive disorder    Please see After Visit Summary for patient specific instructions.  Future Appointments  Date Time Provider [place]  07/12/2021  4:15 PM Felipa Furnace, DPM [facility]  09/14/2021  3:45 PM Ralene Bathe, MD [facility] None  10/27/2021  7:30 AM [facility] 1 [facility] None  10/27/2021  8:30 AM Schnier, Dolores Lory, MD [facility] None  03/09/2022  2:00 PM [facility] None  03/14/2022  3:00 PM McGowan, Larene Beach A, PA-C [facility] None    No orders of the defined types were placed in this encounter.     -------------------------------

## 2021-06-01 ENCOUNTER — Telehealth: Payer: Self-pay | Admitting: Psychiatry

## 2021-06-01 NOTE — Telephone Encounter (Signed)
I read the patient the notes from his last visit and he thought that sounded right to him, he just wanted to verify.

## 2021-06-01 NOTE — Telephone Encounter (Signed)
Pt LVM asking for a call back to clarify a medication reduction he discussed with Janett Billow.   Next appt 3/9

## 2021-06-15 ENCOUNTER — Emergency Department
Admission: EM | Admit: 2021-06-15 | Discharge: 2021-06-15 | Disposition: A | Discharge status: Home / Self Care | Payer: Medicare HMO | Attending: Emergency Medicine | Admitting: Emergency Medicine

## 2021-06-15 ENCOUNTER — Emergency Department: Payer: Medicare HMO

## 2021-06-15 ENCOUNTER — Other Ambulatory Visit: Payer: Self-pay

## 2021-06-15 DIAGNOSIS — S0101XA Laceration without foreign body of scalp, initial encounter: Secondary | ICD-10-CM | POA: Insufficient documentation

## 2021-06-15 DIAGNOSIS — W010XXA Fall on same level from slipping, tripping and stumbling without subsequent striking against object, initial encounter: Secondary | ICD-10-CM | POA: Diagnosis not present

## 2021-06-15 DIAGNOSIS — S60312A Abrasion of left thumb, initial encounter: Secondary | ICD-10-CM | POA: Diagnosis not present

## 2021-06-15 DIAGNOSIS — S0990XA Unspecified injury of head, initial encounter: Secondary | ICD-10-CM | POA: Diagnosis present

## 2021-06-15 DIAGNOSIS — E119 Type 2 diabetes mellitus without complications: Secondary | ICD-10-CM | POA: Insufficient documentation

## 2021-06-15 DIAGNOSIS — T07XXXA Unspecified multiple injuries, initial encounter: Secondary | ICD-10-CM

## 2021-06-15 DIAGNOSIS — Z7982 Long term (current) use of aspirin: Secondary | ICD-10-CM | POA: Diagnosis not present

## 2021-06-15 DIAGNOSIS — S43401A Unspecified sprain of right shoulder joint, initial encounter: Secondary | ICD-10-CM | POA: Insufficient documentation

## 2021-06-15 MED ORDER — NAPROXEN 500 MG PO TABS: 500.0000 mg | ORAL_TABLET | Freq: Two times a day (BID) | ORAL | 2 refills | Status: DC

## 2021-06-15 NOTE — ED Triage Notes (Signed)
Pt comes into the ED via [facility] from [facility] c/o mechanical fall.  Pt has known neuropathy and he lost his balance stepping off the curb.  Pt has right shoulder pain with no deformity.  Small laceration to the forehead and left thumb.  Denies any LOC or blood thinners.   82 HR 96% RA 167/96

## 2021-06-15 NOTE — ED Provider Notes (Signed)
[facility] Provider Note    Event Date/Time   First MD Initiated Contact with Patient 06/15/21 1628     (approximate)   History   Fall, head injury, laceration   HPI  Duane Hughes is a 71 y.o. male with a history of right and left knee arthroplasty, diabetes, obesity who presents after a fall.  The patient is on a baby aspirin but no other blood thinners.  Patient reports this was a mechanical fall.  Fell onto his right shoulder primarily and hit his head on asphalt.  No LOC.  No neurodeficits.  Complains of pain in his head, left hand and right shoulder.  No abdominal pain chest pain or lower extremity injuries     Physical Exam   Triage Vital Signs: ED Triage Vitals  Enc Vitals Group     BP 06/15/21 1612 (!) 170/96     Pulse Rate 06/15/21 1612 81     Resp 06/15/21 1612 18     Temp 06/15/21 1612 98 F (36.7 C)     Temp Source 06/15/21 1612 Oral     SpO2 06/15/21 1612 95 %     Weight 06/15/21 1611 122.5 kg (270 lb)     Height 06/15/21 1611 1.753 m (5\' 9" )     Head Circumference --      Peak Flow --      Pain Score 06/15/21 1611 3     Pain Loc --      Pain Edu? --      Excl. in [place]? --     Most recent vital signs: Vitals:   06/15/21 1612 06/15/21 1828  BP: (!) 170/96 (!) 158/95  Pulse: 81 76  Resp: 18 18  Temp: 98 F (36.7 C)   SpO2: 95% 96%     General: Awake, no distress.  CV:  Good peripheral perfusion.  No chest wall tenderness palpation Resp:  Normal effort.  Abd:  No distention.  No tenderness to palpation Other:  Shallow abrasion to the right forehead, shallow abrasion to the base of the left thumb, dorsally  Painful range of motion of the right upper extremity all the patient is able to abduct, doubt dislocation.  Warm and well perfused, no bony abnormalities palpated.  Normal range of motion of the lower extremities without pain   ED Results / Procedures / Treatments   Labs (all labs ordered are listed, but only  abnormal results are displayed) Labs Reviewed - No data to display   EKG     RADIOLOGY Shoulder x-ray viewed and interpreted by me, no dislocation noted    PROCEDURES:  Critical Care performed:   Procedures   MEDICATIONS ORDERED IN ED: Medications - No data to display   IMPRESSION / MDM / [place] / ED COURSE  I reviewed the triage vital signs and the nursing notes.   Patient presents after mechanical fall as detailed above.  Right shoulder pain as well as head injury.  Small shallow abrasions, the one on his thumb will require tissue adhesive.  We will clean and dress the others.  Shoulder x-rays without dislocation or fracture, suspect shoulder strain.  Pending CT head   CT head read as unremarkable by radiology, 1 cm soft tissue lesion noted, discussed with patient and his wife need for outpatient MRI  Wounds dressed and cleaned, shoulder immobilizer/sling placed, outpatient follow-up with Ortho, NSAIDs prescribed         FINAL CLINICAL IMPRESSION(S) /  ED DIAGNOSES   Final diagnoses:  Injury of head, initial encounter  Abrasions of multiple sites  Sprain of right shoulder, unspecified shoulder sprain type, initial encounter     Rx / DC Orders   ED Discharge Orders          Ordered    naproxen (NAPROSYN) 500 MG tablet  2 times daily with meals        06/15/21 1810             Note:  This document was prepared using Dragon voice recognition software and may include unintentional dictation errors.   Lavonia Drafts, MD 06/15/21 Bosie Helper

## 2021-06-15 NOTE — ED Notes (Signed)
Pt to ED with family, came EMS, had mechanical fall, has bilateral neuropathy and tripped on curb, has laceration to R forehead, complains of R shoulder pain, EDP has seen pt and scans have been ordered.  Pt alert and oriented. Denies LOC or blood thinner use.

## 2021-06-15 NOTE — ED Triage Notes (Addendum)
See triage note. Denies any weakness or syncope. Also states right shoulder pain from fall.

## 2021-06-22 ENCOUNTER — Other Ambulatory Visit: Payer: Self-pay | Admitting: Internal Medicine

## 2021-06-22 ENCOUNTER — Telehealth: Payer: Self-pay | Admitting: Psychiatry

## 2021-06-22 DIAGNOSIS — D4989 Neoplasm of unspecified behavior of other specified sites: Secondary | ICD-10-CM

## 2021-06-22 NOTE — Telephone Encounter (Signed)
Spoke with pt and gave him info for [facility]

## 2021-06-22 NOTE — Telephone Encounter (Signed)
Pt lvm that he has misplaced the information about the alternative method to help with depression without using medication. Please call him back with that information at [phone]

## 2021-06-23 ENCOUNTER — Telehealth: Payer: Self-pay | Admitting: Psychiatry

## 2021-06-23 NOTE — Telephone Encounter (Signed)
Pt lvm that he needs a medication history on all the meds he has taken for depression for his insurance. Please call him at [phone]

## 2021-06-24 NOTE — Telephone Encounter (Signed)
There isn't a way to print past med history. It looks like he needs dates of meds tried.

## 2021-06-24 NOTE — Telephone Encounter (Signed)
Is there a way to print past med history from [place], ie. From chart review when you un-click "current meds?" Below if the list that I have.   Past Psychiatric Medication Trials: Pristiq-ineffective Effexor-effective for 2 years then no longer effective Bupropion-taken for years Cymbalta-Not effective Sertraline- Not effective. Loose stools. Celexa Trintellix-Ineffective Viibryd-Ineffective Abilify-not effective at 5 mg and increased emotional lability at 10 mg Lamictal Lithium Pramipexole- effective for mood signs and symptoms Trazodone

## 2021-06-24 NOTE — Telephone Encounter (Signed)
Pt stated he needs this info for the [place] treatment.The insurance needs the history of depression meds he was on including estimated dates he started and stopped.He can pick it up when ready,and needs it asap.Is this something the front office can help with?

## 2021-06-27 ENCOUNTER — Other Ambulatory Visit: Payer: Self-pay | Admitting: Neurosurgery

## 2021-06-27 DIAGNOSIS — M4802 Spinal stenosis, cervical region: Secondary | ICD-10-CM

## 2021-06-27 NOTE — Telephone Encounter (Signed)
I picked up a voice mail this morning and it was a message left on Fri about the same thing.  Is someone planning on calling him back or sending this info to him in some format?  I had not seen this previous message before I spoke to him this morning.  He is expecting a call back from someone to tell him how he can get this info for his insurance company.  Next appt 3/9

## 2021-06-27 NOTE — Telephone Encounter (Signed)
I pulled chart and put in your box.

## 2021-06-27 NOTE — Telephone Encounter (Signed)
Can his paper chart be pulled since it has additional med history?

## 2021-06-27 NOTE — Telephone Encounter (Signed)
Please let me know what I need to do.He did say the dates can be estimates and he just needs to know the year he started/stopped

## 2021-06-28 NOTE — Telephone Encounter (Signed)
Pt sent My Chart message with list. Would you please print the updated list in case he wants it sent to [facility] or wants to pick up a copy?   Past Psychiatric Medication Trials for Duane Hughes   Pristiq-ineffective. Took in 2018. Effexor-effective for 2 years then no longer effective. Took 2016-2017 Bupropion-taken for >5 years Cymbalta-Not effective. Took in 2017 and again 11/29/20-01/04/21. Sertraline- Not effective. Loose stools. Taken 07/22/20-09/03/20 Celexa- Took 09/03/20. Ineffective Trintellix-Ineffective. Took around June 2022 [place] 01/04/21-05/24/21. Was on doses up to 40 mg daily for several months. Ineffective. Abilify-not effective at 5 mg and increased emotional lability at 10 mg. Took in 2018 Lamictal- Started 01/22/17. Lithium-Took in July 2019 Pramipexole- Taken 02/15/21-04/21/21. Initially effective for mood signs and symptoms and then no longer effective Trazodone- Took in 2016

## 2021-06-28 NOTE — Telephone Encounter (Signed)
Emailed info to patient.

## 2021-07-07 ENCOUNTER — Ambulatory Visit
Admission: RE | Admit: 2021-07-07 | Discharge: 2021-07-07 | Disposition: A | Discharge status: Home / Self Care | Payer: Medicare HMO | Source: Ambulatory Visit | Attending: Neurosurgery | Admitting: Neurosurgery

## 2021-07-07 ENCOUNTER — Other Ambulatory Visit: Payer: Self-pay

## 2021-07-07 ENCOUNTER — Ambulatory Visit (INDEPENDENT_AMBULATORY_CARE_PROVIDER_SITE_OTHER): Payer: Medicare HMO | Admitting: Psychiatry

## 2021-07-07 ENCOUNTER — Ambulatory Visit
Admission: RE | Admit: 2021-07-07 | Discharge: 2021-07-07 | Disposition: A | Discharge status: Home / Self Care | Payer: Medicare HMO | Source: Ambulatory Visit | Attending: Internal Medicine | Admitting: Internal Medicine

## 2021-07-07 DIAGNOSIS — F32A Depression, unspecified: Secondary | ICD-10-CM

## 2021-07-07 DIAGNOSIS — M4802 Spinal stenosis, cervical region: Secondary | ICD-10-CM

## 2021-07-07 DIAGNOSIS — D4989 Neoplasm of unspecified behavior of other specified sites: Secondary | ICD-10-CM

## 2021-07-07 MED ORDER — GADOBENATE DIMEGLUMINE 529 MG/ML IV SOLN
20.0000 mL | Freq: Once | INTRAVENOUS | Status: AC | PRN
  Administered 2021-07-07: 20 mL via INTRAVENOUS

## 2021-07-07 MED ORDER — PRAMIPEXOLE DIHYDROCHLORIDE 0.75 MG PO TABS: 0.7500 mg | ORAL_TABLET | Freq: Two times a day (BID) | ORAL | 0 refills | Status: DC

## 2021-07-07 NOTE — Progress Notes (Signed)
Duane Hughes [id number] 11-29-1950 71 y.o.  Subjective:   Patient ID:  Duane Hughes is a 71 y.o. (DOB [birth year]) male.  Chief Complaint:  Chief Complaint  Patient presents with   Depression    HPI Duane Hughes presents to the office today for follow-up of depression. He reports possible worsening in depression recently and this is compounded by progressive weakness and difficulty with balance. He reports that he notices his mood is worse some days than others. He reports low energy and motivation. He reports that sleep is fragmented and awakens after a few hours and makes himself return to sleep. Denies difficulty falling asleep. He reports weight gain and attributes this to fluid. No change in appetite. Concentration is "not as good." He reports passive death wishes in response to increased health issues and not "wanting to be a burden." Denies suicidal intent or plan.   He denies any difficulty with reducing and discontinuing medications. He reports that he experienced worsening RLS when he stopped Pramipexole and then resumed 0.75 mg QHS.  He reports that he fell when his foot grabbed the ground when stepping off a curb and fell on his face. He reports that his right arm and shoulder are sore.   He has contacted [facility] and had initial evaluation. He is now awaiting insurance prior authorization.    Past Psychiatric Medication Trials: Pristiq-ineffective Effexor-effective for 2 years then no longer effective Bupropion-taken for years Cymbalta-Not effective Sertraline- Not effective. Loose stools. Celexa Trintellix-Ineffective Abilify-not effective at 5 mg and increased emotional lability at 10 mg Lamictal Lithium Pramipexole- effective for mood signs and symptoms Trazodone   GAD-7    Flowsheet Row Office Visit from 09/16/2020 in [facility] Office Visit from 11/04/2019 in [facility]  Total GAD-7 Score 1 0       PHQ2-9    [place] Office Visit from 09/16/2020 in [place] from 05/20/2020 in [facility] Office Visit from 11[facility]04/2019 in [facility] Office Visit from 09/18/2019 in [facility] Office Visit from 09/04/2019 in [facility]  PHQ-2 Total Score 1 0 0 0 0  PHQ-9 Total Score 3 -- 0 0 0      [place] ED from 06/15/2021 in [place] No Risk        Review of Systems:  Review of Systems  Cardiovascular:  Positive for leg swelling.  Musculoskeletal:  Negative for gait problem.  Neurological:  Positive for weakness.       Tingling in upper extremities. Dropping things.   Psychiatric/Behavioral:         Please refer to HPI   Medications: I have reviewed the patient's current medications.  Current Outpatient Medications  Medication Sig Dispense Refill   aspirin EC 81 MG tablet Take 81 mg by mouth daily. Swallow whole.     atorvastatin (LIPITOR) 20 MG tablet Take 20 mg by mouth daily.     celecoxib (CELEBREX) 100 MG capsule Take 100 mg by mouth daily as needed.     Cholecalciferol (VITAMIN D) 50 MCG (2000 UT) tablet Take 2,000 Units by mouth daily.     Cyanocobalamin (B-12) 1000 MCG SUBL Place under the tongue.     ferrous sulfate 325 (65 FE) MG EC tablet Take 1 tablet (325 mg total) by mouth 3 (three) times a week. 36 tablet 3   hydrocortisone  2.5 % cream Apply topically daily. 3 times per week 30 g 11   ketoconazole (NIZORAL) 2 % cream Apply 1 application topically daily. 3 times per week 60 g 11   lisinopril (ZESTRIL) 5 MG tablet Take 0.5 tablets (2.5 mg total) by mouth daily. 45 tablet 3   Melaton-Thean-Cham-PassF-LBalm (SLEEP PO) Take by mouth.     metFORMIN (GLUCOPHAGE) 500 MG tablet Take 2 tablets with breakfast and 1 tablet with dinner daily. 270 tablet 3   pseudoephedrine (SUDAFED) 30 MG tablet  Take 30 mg by mouth every 4 (four) hours as needed for congestion.     sildenafil (REVATIO) 20 MG tablet TAKE 3-5 TABLETS 2 HOURS BEFORE INTERCOURSE ON AN EMPTY STOMACH DO NOT TAKE WITH NITRATES 50 tablet 3   naproxen (NAPROSYN) 500 MG tablet Take 1 tablet (500 mg total) by mouth 2 (two) times daily with a meal. (Patient not taking: Reported on 07/07/2021) 20 tablet 2   pramipexole (MIRAPEX) 0.75 MG tablet Take 1 tablet (0.75 mg total) by mouth 2 (two) times daily. 180 tablet 0   No current facility-administered medications for this visit.    Medication Side Effects: None  Allergies: No Known Allergies  Past Medical History:  Diagnosis Date   Anemia    Anxiety    Dr.Liesel Peckenpaugh . well controlled   Arthritis    back, hands, knees   Colon polyp 05/09/2016   TUBULAR ADENOMAS    Depression    Diabetes mellitus without complication ([place])    type 2   Ectatic abdominal aorta ([place]) 04/18/2016   2.9 cm on [place] 2016, unchanged 2017   History of hiatal hernia    20 years ago   History of skin cancer    hand   Hyperlipidemia    Hypertension 2015   Dr. Sanda Klein   Morbid obesity ([place]) 11/11/2014   Neuropathy    Obesity 11/11/2014   Sleep apnea    cpap   Squamous cell carcinoma of skin 04/11/2018   SCC In Situ R dorsum wrist   Squamous cell carcinoma of skin 06/23/2015   R dorsum hand    Past Medical History, Surgical history, Social history, and Family history were reviewed and updated as appropriate.   Please see review of systems for further details on the patient's review from today.   Objective:   Physical Exam:  There were no vitals taken for this visit.  Physical Exam Constitutional:      General: He is not in acute distress. Musculoskeletal:        General: No deformity.  Neurological:     Mental Status: He is alert and oriented to person, place, and time.     Coordination: Coordination normal.  Psychiatric:        Attention and Perception: Attention and perception  normal. He does not perceive auditory or visual hallucinations.        Mood and Affect: Mood is depressed. Mood is not anxious. Affect is not labile, blunt, angry or inappropriate.        Speech: Speech normal.        Behavior: Behavior normal.        Thought Content: Thought content normal. Thought content is not paranoid or delusional. Thought content does not include homicidal or suicidal ideation. Thought content does not include homicidal or suicidal plan.        Cognition and Memory: Cognition and memory normal.        Judgment: Judgment normal.  Comments: Insight intact    Lab Review:     Component Value Date/Time   NA 140 06/07/2020 0812   NA 139 05/12/2015 1537   K 4.3 06/07/2020 0812   CL 108 06/07/2020 0812   CO2 23 06/07/2020 0812   GLUCOSE 105 (H) 06/07/2020 0812   BUN 15 06/07/2020 0812   BUN 15 05/12/2015 1537   CREATININE 0.90 06/07/2020 0812   CALCIUM 8.4 (L) 06/07/2020 0812   PROT 5.9 (L) 06/07/2020 0812   PROT 6.2 03/19/2015 0825   ALBUMIN 4.1 09/19/2016 1400   ALBUMIN 4.0 03/19/2015 0825   AST 26 06/07/2020 0812   ALT 30 06/07/2020 0812   ALKPHOS 46 09/19/2016 1400   BILITOT 0.2 06/07/2020 0812   BILITOT 0.5 03/19/2015 0825   GFRNONAA 86 06/07/2020 0812   GFRAA 100 06/07/2020 0812       Component Value Date/Time   WBC 5.2 06/07/2020 0812   RBC 4.63 06/07/2020 0812   HGB 14.5 06/07/2020 0812   HGB 14.8 06/20/2016 1458   HCT 42.6 06/07/2020 0812   HCT 43.6 06/20/2016 1458   PLT 250 06/07/2020 0812   PLT 260 06/20/2016 1458   MCV 92.0 06/07/2020 0812   MCV 93 06/20/2016 1458   MCH 31.3 06/07/2020 0812   MCHC 34.0 06/07/2020 0812   RDW 13.6 06/07/2020 0812   RDW 13.3 06/20/2016 1458   LYMPHSABS 1,404 06/07/2020 0812   LYMPHSABS 2.0 06/20/2016 1458   MONOABS 0.4 03/29/2017 1343   EOSABS 312 06/07/2020 0812   EOSABS 0.4 06/20/2016 1458   BASOSABS 52 06/07/2020 0812   BASOSABS 0.1 06/20/2016 1458    No results found for: POCLITH, LITHIUM    No results found for: PHENYTOIN, PHENOBARB, VALPROATE, CBMZ   .res Assessment: Plan:   Pt seen for 30 minutes and time spent discussing response to decrease and discontinuation of several of his psychiatric medications. Pt reports that he would like to continue Pramipexole 0.75 mg po BID since he experienced significant worsening in RLS with dose reduction. Will continue Pramipexole 0.75 mg po BID.  He reports that he prefers not to resume any previous antidepressants or start a new treatment option at this time and prefers to pursue [place] if covered by insurance.  Pt to follow-up as needed.   Wil[place]th was seen today for depression.  Diagnoses and all orders for this visit:  Chronic depressive disorder -     pramipexole (MIRAPEX) 0.75 MG tablet; Take 1 tablet (0.75 mg total) by mouth 2 (two) times daily.     Please see After Visit Summary for patient specific instructions.  Future Appointments  Date Time Provider [place]  07/12/2021  4:15 PM Felipa Furnace, DPM [facility]  09/14/2021  3:45 PM Ralene Bathe, MD [facility] None  10/27/2021  7:30 AM [facility] 1 [facility] None  10/27/2021  8:30 AM Schnier, Dolores Lory, MD [facility] None  03/09/2022  2:00 PM [facility] None  03/14/2022  3:00 PM McGowan, Larene Beach A, PA-C [facility] None    No orders of the defined types were placed in this encounter.   -------------------------------

## 2021-07-08 ENCOUNTER — Encounter: Payer: Self-pay | Admitting: Psychiatry

## 2021-07-12 ENCOUNTER — Ambulatory Visit (INDEPENDENT_AMBULATORY_CARE_PROVIDER_SITE_OTHER): Payer: Medicare HMO | Admitting: Podiatry

## 2021-07-12 ENCOUNTER — Other Ambulatory Visit: Payer: Self-pay

## 2021-07-12 DIAGNOSIS — B351 Tinea unguium: Secondary | ICD-10-CM

## 2021-07-12 DIAGNOSIS — M79675 Pain in left toe(s): Secondary | ICD-10-CM

## 2021-07-12 DIAGNOSIS — E1169 Type 2 diabetes mellitus with other specified complication: Secondary | ICD-10-CM

## 2021-07-12 DIAGNOSIS — E669 Obesity, unspecified: Secondary | ICD-10-CM | POA: Diagnosis not present

## 2021-07-12 DIAGNOSIS — M79674 Pain in right toe(s): Secondary | ICD-10-CM

## 2021-07-15 ENCOUNTER — Encounter: Payer: Self-pay | Admitting: Podiatry

## 2021-07-15 NOTE — Progress Notes (Signed)
This patient returns to my office for at risk foot care.  This patient requires this care by a professional since this patient will be at risk due to having diabetes.  This patient is unable to cut nails himself since the patient cannot reach his nails.These nails are painful walking and wearing shoes.  This patient presents for at risk foot care today.  Patient presents with secondary complaint today of dry skin.  He would like to discuss treatment options he does not moisturize his foot.  He denies any other acute complaints.  General Appearance  Alert, conversant and in no acute stress.  Vascular  Dorsalis pedis and posterior tibial  pulses are palpable  bilaterally.  Capillary return is within normal limits  bilaterally. Temperature is within normal limits  bilaterally.  Neurologic  Senn-Weinstein monofilament wire test within normal limits  bilaterally. Muscle power within normal limits bilaterally.  Nails Thick disfigured discolored nails with subungual debris  from hallux to fifth toes bilaterally. No evidence of bacterial infection or drainage bilaterally.  Orthopedic  No limitations of motion  feet .  No crepitus or effusions noted.  No bony pathology or digital deformities noted.  Skin  normotropic skin with no porokeratosis noted bilaterally.  No signs of infections or ulcers noted.   Dry skin noted to both lower extremity.  Onychomycosis  Pain in right toes  Pain in left toes  Consent was obtained for treatment procedures.   Mechanical debridement of nails 1-5  bilaterally performed with a nail nipper.  Filed with dremel without incident.    Return office visit   10 weeks                   Told patient to return for periodic foot care and evaluation due to potential at risk complications.  Xerosis skin -Clinically improving    Blimie Vaness D.P.M. 

## 2021-07-21 ENCOUNTER — Other Ambulatory Visit (HOSPITAL_COMMUNITY): Payer: Self-pay | Admitting: Family Medicine

## 2021-07-21 DIAGNOSIS — I517 Cardiomegaly: Secondary | ICD-10-CM

## 2021-07-21 DIAGNOSIS — R600 Localized edema: Secondary | ICD-10-CM

## 2021-07-21 DIAGNOSIS — I714 Abdominal aortic aneurysm, without rupture, unspecified: Secondary | ICD-10-CM

## 2021-07-26 ENCOUNTER — Other Ambulatory Visit: Payer: Self-pay

## 2021-07-26 ENCOUNTER — Ambulatory Visit (HOSPITAL_COMMUNITY)
Admission: RE | Admit: 2021-07-26 | Discharge: 2021-07-26 | Disposition: A | Discharge status: Home / Self Care | Payer: Medicare HMO | Source: Ambulatory Visit | Attending: Family Medicine | Admitting: Family Medicine

## 2021-07-26 DIAGNOSIS — I714 Abdominal aortic aneurysm, without rupture, unspecified: Secondary | ICD-10-CM | POA: Insufficient documentation

## 2021-07-26 DIAGNOSIS — E119 Type 2 diabetes mellitus without complications: Secondary | ICD-10-CM | POA: Insufficient documentation

## 2021-07-26 DIAGNOSIS — I517 Cardiomegaly: Secondary | ICD-10-CM

## 2021-07-26 DIAGNOSIS — E785 Hyperlipidemia, unspecified: Secondary | ICD-10-CM | POA: Insufficient documentation

## 2021-07-26 DIAGNOSIS — I119 Hypertensive heart disease without heart failure: Secondary | ICD-10-CM | POA: Insufficient documentation

## 2021-07-26 DIAGNOSIS — R600 Localized edema: Secondary | ICD-10-CM | POA: Diagnosis not present

## 2021-07-26 LAB — ECHOCARDIOGRAM COMPLETE
Area-P 1/2: 3.42 cm2
S' Lateral: 3.2 cm

## 2021-08-09 DIAGNOSIS — H9193 Unspecified hearing loss, bilateral: Secondary | ICD-10-CM | POA: Insufficient documentation

## 2021-09-09 ENCOUNTER — Other Ambulatory Visit: Payer: Self-pay | Admitting: Family Medicine

## 2021-09-09 DIAGNOSIS — E1169 Type 2 diabetes mellitus with other specified complication: Secondary | ICD-10-CM

## 2021-09-14 ENCOUNTER — Ambulatory Visit: Payer: Medicare HMO | Admitting: Dermatology

## 2021-09-14 DIAGNOSIS — Z85828 Personal history of other malignant neoplasm of skin: Secondary | ICD-10-CM | POA: Diagnosis not present

## 2021-09-14 DIAGNOSIS — L821 Other seborrheic keratosis: Secondary | ICD-10-CM

## 2021-09-14 DIAGNOSIS — L578 Other skin changes due to chronic exposure to nonionizing radiation: Secondary | ICD-10-CM

## 2021-09-14 DIAGNOSIS — L820 Inflamed seborrheic keratosis: Secondary | ICD-10-CM | POA: Diagnosis not present

## 2021-09-14 DIAGNOSIS — Z1283 Encounter for screening for malignant neoplasm of skin: Secondary | ICD-10-CM | POA: Diagnosis not present

## 2021-09-14 DIAGNOSIS — L570 Actinic keratosis: Secondary | ICD-10-CM | POA: Diagnosis not present

## 2021-09-14 DIAGNOSIS — D1800 Hemangioma unspecified site: Secondary | ICD-10-CM

## 2021-09-14 DIAGNOSIS — L814 Other melanin hyperpigmentation: Secondary | ICD-10-CM | POA: Diagnosis not present

## 2021-09-14 DIAGNOSIS — D229 Melanocytic nevi, unspecified: Secondary | ICD-10-CM

## 2021-09-14 NOTE — Progress Notes (Signed)
Follow-Up Visit   Subjective  Duane Hughes is a 71 y.o. male who presents for the following: Annual Exam (Hx SCC and Ak's). The patient presents for Total-Body Skin Exam (TBSE) for skin cancer screening and mole check.  The patient has spots, moles and lesions to be evaluated, some may be new or changing and the patient has concerns that these could be cancer.  The following portions of the chart were reviewed this encounter and updated as appropriate:   Tobacco  Allergies  Meds  Problems  Med Hx  Surg Hx  Fam Hx     Review of Systems:  No other skin or systemic complaints except as noted in HPI or Assessment and Plan.  Objective  Well appearing patient in no apparent distress; mood and affect are within normal limits.  A full examination was performed including scalp, head, eyes, ears, nose, lips, neck, chest, axillae, abdomen, back, buttocks, bilateral upper extremities, bilateral lower extremities, hands, feet, fingers, toes, fingernails, and toenails. All findings within normal limits unless otherwise noted below.  B/L hands and arms x 20, face x 8 (28) Erythematous thin papules/macules with gritty scale.   Face x 2, B/L hands and arms x 15 (17) Erythematous stuck-on, waxy papule or plaque   Assessment & Plan  AK (actinic keratosis) (28) B/L hands and arms x 20, face x 8  Destruction of lesion - B/L hands and arms x 20, face x 8 Complexity: simple   Destruction method: cryotherapy   Informed consent: discussed and consent obtained   Timeout:  patient name, date of birth, surgical site, and procedure verified Lesion destroyed using liquid nitrogen: Yes   Region frozen until ice ball extended beyond lesion: Yes   Outcome: patient tolerated procedure well with no complications   Post-procedure details: wound care instructions given    Inflamed seborrheic keratosis (17) Face x 2, B/L hands and arms x 15  Destruction of lesion - Face x 2, B/L hands and arms x  15 Complexity: simple   Destruction method: cryotherapy   Informed consent: discussed and consent obtained   Timeout:  patient name, date of birth, surgical site, and procedure verified Lesion destroyed using liquid nitrogen: Yes   Region frozen until ice ball extended beyond lesion: Yes   Outcome: patient tolerated procedure well with no complications   Post-procedure details: wound care instructions given    Skin cancer screening  Lentigines - Scattered tan macules - Due to sun exposure - Benign-appearing, observe - Recommend daily broad spectrum sunscreen SPF 30+ to sun-exposed areas, reapply every 2 hours as needed. - Call for any changes  Seborrheic Keratoses - Stuck-on, waxy, tan-brown papules and/or plaques  - Benign-appearing - Discussed benign etiology and prognosis. - Observe - Call for any changes  Melanocytic Nevi - Tan-brown and/or pink-flesh-colored symmetric macules and papules - Benign appearing on exam today - Observation - Call clinic for new or changing moles - Recommend daily use of broad spectrum spf 30+ sunscreen to sun-exposed areas.   Hemangiomas - Red papules - Discussed benign nature - Observe - Call for any changes  Actinic Damage - Chronic condition, secondary to cumulative UV/sun exposure - diffuse scaly erythematous macules with underlying dyspigmentation - Recommend daily broad spectrum sunscreen SPF 30+ to sun-exposed areas, reapply every 2 hours as needed.  - Staying in the shade or wearing long sleeves, sun glasses (UVA+UVB protection) and wide brim hats (4-inch brim around the entire circumference of the hat) are also recommended  for sun protection.  - Call for new or changing lesions.  History of Squamous Cell Carcinoma of the Skin - No evidence of recurrence today - No lymphadenopathy - Recommend regular full body skin exams - Recommend daily broad spectrum sunscreen SPF 30+ to sun-exposed areas, reapply every 2 hours as needed.   - Call if any new or changing lesions are noted between office visits  Skin cancer screening performed today.  Return in about 3 months (around 12/15/2021) for Ak follow up .  Luther Redo, CMA, am acting as scribe for Sarina Ser, MD . Documentation: I have reviewed the above documentation for accuracy and completeness, and I agree with the above.  Sarina Ser, MD

## 2021-09-14 NOTE — Patient Instructions (Signed)

## 2021-09-21 ENCOUNTER — Other Ambulatory Visit: Payer: Self-pay | Admitting: Family Medicine

## 2021-09-21 ENCOUNTER — Other Ambulatory Visit: Payer: Self-pay | Admitting: Psychiatry

## 2021-09-21 DIAGNOSIS — E669 Obesity, unspecified: Secondary | ICD-10-CM

## 2021-09-21 DIAGNOSIS — F32A Depression, unspecified: Secondary | ICD-10-CM

## 2021-09-24 ENCOUNTER — Encounter: Payer: Self-pay | Admitting: Dermatology

## 2021-10-03 ENCOUNTER — Encounter (INDEPENDENT_AMBULATORY_CARE_PROVIDER_SITE_OTHER): Payer: Self-pay | Admitting: Vascular Surgery

## 2021-10-13 ENCOUNTER — Ambulatory Visit: Payer: Medicare HMO | Admitting: Podiatry

## 2021-10-14 ENCOUNTER — Other Ambulatory Visit: Payer: Self-pay | Admitting: Student in an Organized Health Care Education/Training Program

## 2021-10-14 DIAGNOSIS — D4989 Neoplasm of unspecified behavior of other specified sites: Secondary | ICD-10-CM

## 2021-10-18 ENCOUNTER — Ambulatory Visit (INDEPENDENT_AMBULATORY_CARE_PROVIDER_SITE_OTHER): Payer: Medicare HMO | Admitting: Podiatry

## 2021-10-18 ENCOUNTER — Encounter: Payer: Self-pay | Admitting: Podiatry

## 2021-10-18 DIAGNOSIS — M79675 Pain in left toe(s): Secondary | ICD-10-CM

## 2021-10-18 DIAGNOSIS — E669 Obesity, unspecified: Secondary | ICD-10-CM | POA: Diagnosis not present

## 2021-10-18 DIAGNOSIS — E1169 Type 2 diabetes mellitus with other specified complication: Secondary | ICD-10-CM | POA: Diagnosis not present

## 2021-10-18 DIAGNOSIS — B351 Tinea unguium: Secondary | ICD-10-CM | POA: Diagnosis not present

## 2021-10-18 DIAGNOSIS — M79674 Pain in right toe(s): Secondary | ICD-10-CM

## 2021-10-18 NOTE — Progress Notes (Signed)
This patient returns to my office for at risk foot care.  This patient requires this care by a professional since this patient will be at risk due to having diabetes.  This patient is unable to cut nails himself since the patient cannot reach his nails.These nails are painful walking and wearing shoes.  This patient presents for at risk foot care today.  Patient presents with secondary complaint today of dry skin.  He would like to discuss treatment options he does not moisturize his foot.  He denies any other acute complaints.  General Appearance  Alert, conversant and in no acute stress.  Vascular  Dorsalis pedis and posterior tibial  pulses are palpable  bilaterally.  Capillary return is within normal limits  bilaterally. Temperature is within normal limits  bilaterally.  Neurologic  Senn-Weinstein monofilament wire test within normal limits  bilaterally. Muscle power within normal limits bilaterally.  Nails Thick disfigured discolored nails with subungual debris  from hallux to fifth toes bilaterally. No evidence of bacterial infection or drainage bilaterally.  Orthopedic  No limitations of motion  feet .  No crepitus or effusions noted.  No bony pathology or digital deformities noted.  Skin  normotropic skin with no porokeratosis noted bilaterally.  No signs of infections or ulcers noted.   Dry skin noted to both lower extremity.  Onychomycosis  Pain in right toes  Pain in left toes  Consent was obtained for treatment procedures.   Mechanical debridement of nails 1-5  bilaterally performed with a nail nipper.  Filed with dremel without incident.    Return office visit   10 weeks                   Told patient to return for periodic foot care and evaluation due to potential at risk complications.  Xerosis skin -Clinically improving    Griffin Dewilde D.P.M. 

## 2021-10-27 ENCOUNTER — Ambulatory Visit (INDEPENDENT_AMBULATORY_CARE_PROVIDER_SITE_OTHER): Payer: Medicare HMO | Admitting: Vascular Surgery

## 2021-10-27 ENCOUNTER — Encounter (INDEPENDENT_AMBULATORY_CARE_PROVIDER_SITE_OTHER): Payer: Self-pay | Admitting: Vascular Surgery

## 2021-10-27 ENCOUNTER — Other Ambulatory Visit (INDEPENDENT_AMBULATORY_CARE_PROVIDER_SITE_OTHER): Payer: Self-pay | Admitting: Nurse Practitioner

## 2021-10-27 ENCOUNTER — Other Ambulatory Visit: Payer: Medicare HMO

## 2021-10-27 ENCOUNTER — Ambulatory Visit (INDEPENDENT_AMBULATORY_CARE_PROVIDER_SITE_OTHER): Payer: Medicare HMO

## 2021-10-27 VITALS — BP 153/83 | HR 73 | Resp 17 | Ht 69.0 in | Wt 274.0 lb

## 2021-10-27 DIAGNOSIS — I10 Essential (primary) hypertension: Secondary | ICD-10-CM | POA: Diagnosis not present

## 2021-10-27 DIAGNOSIS — E669 Obesity, unspecified: Secondary | ICD-10-CM

## 2021-10-27 DIAGNOSIS — I7143 Infrarenal abdominal aortic aneurysm, without rupture: Secondary | ICD-10-CM | POA: Diagnosis not present

## 2021-10-27 DIAGNOSIS — I714 Abdominal aortic aneurysm, without rupture, unspecified: Secondary | ICD-10-CM

## 2021-10-27 DIAGNOSIS — I890 Lymphedema, not elsewhere classified: Secondary | ICD-10-CM | POA: Diagnosis not present

## 2021-10-27 DIAGNOSIS — E785 Hyperlipidemia, unspecified: Secondary | ICD-10-CM

## 2021-10-27 DIAGNOSIS — E1169 Type 2 diabetes mellitus with other specified complication: Secondary | ICD-10-CM

## 2021-10-27 NOTE — Progress Notes (Signed)
MRN : [id number]  Duane Hughes is a 71 y.o. (08-12-1950) male who presents with chief complaint of check circulation.  History of Present Illness:   The patient presents to the office for evaluation of an abdominal aortic aneurysm. The aneurysm was found incidentally and is followed by ultrasound. Patient denies abdominal pain or unusual back pain, no other abdominal complaints.  No history of an acute onset of painful blue discoloration of the toes.      No family history of AAA.    Since the previous visit the patient has been wearing graduated compression stockings and has noted little if any improvement in the lymphedema. The patient has not been using compression routinely morning until night.   The patient also states elevation during the day and exercise is being done too.     Patient denies amaurosis fugax or TIA symptoms. There is no history of claudication or rest pain symptoms of the lower extremities.  The patient denies angina or shortness of breath.   Duplex ultrasound shows an AAA that measures 2.80 cm, the previous measurement was 2.80 cm    Current Meds  Medication Sig   aspirin EC 81 MG tablet Take 81 mg by mouth daily. Swallow whole.   atorvastatin (LIPITOR) 20 MG tablet Take 20 mg by mouth daily.   Cyanocobalamin (B-12) 1000 MCG SUBL Place under the tongue.   ferrous sulfate 325 (65 FE) MG EC tablet Take 1 tablet (325 mg total) by mouth 3 (three) times a week.   hydrocortisone 2.5 % cream Apply topically daily. 3 times per week   ketoconazole (NIZORAL) 2 % cream Apply 1 application topically daily. 3 times per week   lisinopril (ZESTRIL) 5 MG tablet Take 0.5 tablets (2.5 mg total) by mouth daily.   Melaton-Thean-Cham-PassF-LBalm (SLEEP PO) Take by mouth.   metFORMIN (GLUCOPHAGE) 500 MG tablet Take 2 tablets with breakfast and 1 tablet with dinner daily.   pramipexole (MIRAPEX) 0.75 MG tablet TAKE 1 TABLET (0.75 MG TOTAL) BY MOUTH 2 (TWO)  TIMES DAILY.   pseudoephedrine (SUDAFED) 30 MG tablet Take 30 mg by mouth every 4 (four) hours as needed for congestion.   sildenafil (REVATIO) 20 MG tablet TAKE 3-5 TABLETS 2 HOURS BEFORE INTERCOURSE ON AN EMPTY STOMACH DO NOT TAKE WITH NITRATES   Vitamin D, Ergocalciferol, 50 MCG (2000 UT) CAPS     Past Medical History:  Diagnosis Date   Anemia    Anxiety    Dr.Jessica Carter . well controlled   Arthritis    back, hands, knees   Colon polyp 05/09/2016   TUBULAR ADENOMAS    Depression    Diabetes mellitus without complication ([place])    type 2   Ectatic abdominal aorta ([place]) 04/18/2016   2.9 cm on [place] 2016, unchanged 2017   History of hiatal hernia    20 years ago   History of skin cancer    hand   Hyperlipidemia    Hypertension 2015   Dr. Sanda Klein   Morbid obesity ([place]) 11/11/2014   Neuropathy    Obesity 11/11/2014   Sleep apnea    cpap   Squamous cell carcinoma of skin 04/11/2018   SCC In Situ R dorsum wrist   Squamous cell carcinoma of skin 06/23/2015   R dorsum hand    Past Surgical History:  Procedure Laterality Date   COLONOSCOPY WITH PROPOFOL N/A 05/09/2016   Procedure:  COLONOSCOPY WITH PROPOFOL;  Surgeon: Lucilla Lame, MD;  Location[facility] ENDOSCOPY;  Service: Endoscopy;  Laterality: N/A;   HERNIA REPAIR     Hiatal   JOINT REPLACEMENT     04-04-17 Dr. Gladstone Lighter   Right knee   KNEE ARTHROSCOPY     several scopes done   KNEE CLOSED REDUCTION Right 05/31/2017   Procedure: CLOSED MANIPULATION RIGHT KNEE;  Surgeon: Latanya Maudlin, MD;  Location: [facility] ORS;  Service: Orthopedics;  Laterality: Right;  Femoral Block   LUMBAR LAMINECTOMY/DECOMPRESSION MICRODISCECTOMY N/A 01/26/2020   Procedure: L3, L4 LAMINECTOMIES;  Surgeon: Deetta Perla, MD;  Location: [facility] ORS;  Service: Neurosurgery;  Laterality: N/A;   MEDIAL PARTIAL KNEE REPLACEMENT Left    2000s?   SKIN CANCER EXCISION  Feb 2017   removed from right hand   TOTAL KNEE ARTHROPLASTY Right 04/04/2017   Procedure: RIGHT TOTAL  KNEE ARTHROPLASTY;  Surgeon: Latanya Maudlin, MD;  Location: [facility] ORS;  Service: Orthopedics;  Laterality: Right;   TOTAL KNEE ARTHROPLASTY Left 09/24/2018   Procedure: REVISION LEFT KNEE ARTHROPLASTY;  Surgeon: Paralee Cancel, MD;  Location: [facility] ORS;  Service: Orthopedics;  Laterality: Left;  70 mins    Social History Social History   Tobacco Use   Smoking status: Never   Smokeless tobacco: Never  Vaping Use   Vaping Use: Never used  Substance Use Topics   Alcohol use: No    Alcohol/week: 0.0 standard drinks of alcohol   Drug use: No    Family History Family History  Problem Relation Age of Onset   Alcohol abuse Mother    Rheum arthritis Mother    Stroke Maternal Grandmother    Stroke Paternal Grandfather    Dementia Paternal Grandmother    Cancer Neg Hx    Diabetes Neg Hx    Heart disease Neg Hx    Hypertension Neg Hx    Kidney disease Neg Hx    Prostate cancer Neg Hx     No Known Allergies   REVIEW OF SYSTEMS (Negative unless checked)  Constitutional: '[]'$ Weight loss  '[]'$ Fever  '[]'$ Chills Cardiac: '[]'$ Chest pain   '[]'$ Chest pressure   '[]'$ Palpitations   '[]'$ Shortness of breath when laying flat   '[]'$ Shortness of breath with exertion. Vascular:  '[x]'$ Pain in legs with walking   '[]'$ Pain in legs at rest  '[]'$ History of DVT   '[]'$ Phlebitis   '[]'$ Swelling in legs   '[]'$ Varicose veins   '[]'$ Non-healing ulcers Pulmonary:   '[]'$ Uses home oxygen   '[]'$ Productive cough   '[]'$ Hemoptysis   '[]'$ Wheeze  '[]'$ COPD   '[]'$ Asthma Neurologic:  '[]'$ Dizziness   '[]'$ Seizures   '[]'$ History of stroke   '[]'$ History of TIA  '[]'$ Aphasia   '[]'$ Vissual changes   '[]'$ Weakness or numbness in arm   '[]'$ Weakness or numbness in leg Musculoskeletal:   '[]'$ Joint swelling   '[]'$ Joint pain   '[]'$ Low back pain Hematologic:  '[]'$ Easy bruising  '[]'$ Easy bleeding   '[]'$ Hypercoagulable state   '[]'$ Anemic Gastrointestinal:  '[]'$ Diarrhea   '[]'$ Vomiting  '[]'$ Gastroesophageal reflux/heartburn   '[]'$ Difficulty swallowing. Genitourinary:  '[]'$ Chronic kidney disease   '[]'$ Difficult urination   '[]'$ Frequent urination   '[]'$ Blood in urine Skin:  '[]'$ Rashes   '[]'$ Ulcers  Psychological:  '[]'$ History of anxiety   '[]'$  History of major depression.  Physical Examination  Vitals:   10/27/21 0824  BP: (!) 153/83  Pulse: 73  Resp: 17  Weight: 274 lb (124.3 kg)  Height: '5\' 9"'$  (1.753 m)   Body mass index is 40.46 kg/m. Gen: WD/WN, NAD Head: [place]/AT, No temporalis wasting.  Ear/Nose/Throat:  Hearing grossly intact, nares w/o erythema or drainage Eyes: PER, EOMI, sclera nonicteric.  Neck: Supple, no masses.  No bruit or JVD.  Pulmonary:  Good air movement, no audible wheezing, no use of accessory muscles.  Cardiac: RRR, normal S1, S2, no Murmurs. Vascular:  mild trophic changes, no open wounds Vessel Right Left  Radial Palpable Palpable  Gastrointestinal: soft, non-distended. No guarding/no peritoneal signs.  Musculoskeletal: M/S 5/5 throughout.  No visible deformity.  Neurologic: CN 2-12 intact. Pain and light touch intact in extremities.  Symmetrical.  Speech is fluent. Motor exam as listed above. Psychiatric: Judgment intact, Mood & affect appropriate for pt's clinical situation. Dermatologic: No rashes or ulcers noted.  No changes consistent with cellulitis.   CBC Lab Results  Component Value Date   WBC 5.2 06/07/2020   HGB 14.5 06/07/2020   HCT 42.6 06/07/2020   MCV 92.0 06/07/2020   PLT 250 06/07/2020    BMET    Component Value Date/Time   NA 140 06/07/2020 0812   NA 139 05/12/2015 1537   K 4.3 06/07/2020 0812   CL 108 06/07/2020 0812   CO2 23 06/07/2020 0812   GLUCOSE 105 (H) 06/07/2020 0812   BUN 15 06/07/2020 0812   BUN 15 05/12/2015 1537   CREATININE 0.90 06/07/2020 0812   CALCIUM 8.4 (L) 06/07/2020 0812   GFRNONAA 86 06/07/2020 0812   GFRAA 100 06/07/2020 0812   CrCl cannot be calculated (Patient's most recent lab result is older than the maximum 21 days allowed.).  COAG Lab Results  Component Value Date   INR 1.0 01/22/2020   INR 0.98 03/29/2017     Radiology No results found.   Assessment/Plan 1. Infrarenal abdominal aortic aneurysm (AAA) without rupture (HCC) Recommend: No surgery or intervention is indicated at this time.  The patient has an asymptomatic abdominal aortic aneurysm that is less than 4 cm in maximal diameter.    I have reviewed the natural history of abdominal aortic aneurysm and the small risk of rupture for aneurysm less than 5 cm in size.  However, as these small aneurysms tend to enlarge over time, continued surveillance with ultrasound or CT scan is mandatory.   I have also discussed optimizing medical management with hypertension and lipid control and the importance of abstinence from tobacco.  The patient is also encouraged to exercise a minimum of 30 minutes 4 times a week.   Should the patient develop new onset abdominal or back pain or signs of peripheral embolization they are instructed to seek medical attention immediately and to alert the physician providing care that they have an aneurysm.   The patient voices their understanding.  The patient will return in 12 months with an aortic duplex.  - VAS US AORTA/IVC/ILIACS; Future  2. Lymphedema Recommend:  No surgery or intervention at this point in time.  I have reviewed my discussion with the patient regarding venous insufficiency and why it causes symptoms. I have discussed with the patient the chronic skin changes that accompany venous insufficiency and the long term sequela such as ulceration. Patient will contnue wearing graduated compression stockings on a daily basis, as this has provided excellent control of his edema. The patient will put the stockings on first thing in the morning and removing them in the evening. The patient is reminded not to sleep in the stockings.  In addition, behavioral modification including elevation during the day will be initiated. Exercise is strongly encouraged.  Previous duplex ultrasound of the lower  extremities  shows normal deep system, no significant superficial reflux was identified.  Given the patient's good control and lack of any problems regarding the venous insufficiency and lymphedema a lymph pump in not need at this time.    3. Benign hypertension Continue antihypertensive medications as already ordered, these medications have been reviewed and there are no changes at this time.   4. Diabetes mellitus type 2 in obese ([place]) Continue hypoglycemic medications as already ordered, these medications have been reviewed and there are no changes at this time.  Hgb A1C to be monitored as already arranged by primary service   5. Dyslipidemia Continue statin as ordered and reviewed, no changes at this time     Hortencia Pilar, MD  10/27/2021 8:36 AM

## 2021-10-30 ENCOUNTER — Encounter (INDEPENDENT_AMBULATORY_CARE_PROVIDER_SITE_OTHER): Payer: Self-pay | Admitting: Vascular Surgery

## 2021-11-08 ENCOUNTER — Ambulatory Visit
Admission: RE | Admit: 2021-11-08 | Discharge: 2021-11-08 | Disposition: A | Discharge status: Home / Self Care | Payer: Medicare HMO | Source: Ambulatory Visit | Attending: Student in an Organized Health Care Education/Training Program | Admitting: Student in an Organized Health Care Education/Training Program

## 2021-11-08 DIAGNOSIS — D4989 Neoplasm of unspecified behavior of other specified sites: Secondary | ICD-10-CM

## 2021-11-08 MED ORDER — GADOBENATE DIMEGLUMINE 529 MG/ML IV SOLN
20.0000 mL | Freq: Once | INTRAVENOUS | Status: AC | PRN
  Administered 2021-11-08: 20 mL via INTRAVENOUS

## 2021-12-08 ENCOUNTER — Encounter: Payer: Self-pay | Admitting: Podiatry

## 2021-12-23 ENCOUNTER — Other Ambulatory Visit: Payer: Self-pay | Admitting: Urology

## 2021-12-23 ENCOUNTER — Telehealth: Payer: Self-pay | Admitting: Urology

## 2021-12-26 ENCOUNTER — Ambulatory Visit: Payer: Medicare HMO | Admitting: Dermatology

## 2022-01-09 NOTE — Telephone Encounter (Signed)
error 

## 2022-01-12 ENCOUNTER — Telehealth: Payer: Self-pay | Admitting: Psychiatry

## 2022-01-12 ENCOUNTER — Other Ambulatory Visit: Payer: Self-pay

## 2022-01-12 DIAGNOSIS — F32A Depression, unspecified: Secondary | ICD-10-CM

## 2022-01-12 MED ORDER — PRAMIPEXOLE DIHYDROCHLORIDE 0.75 MG PO TABS: 0.7500 mg | ORAL_TABLET | Freq: Two times a day (BID) | ORAL | 1 refills | Status: DC

## 2022-01-12 NOTE — Telephone Encounter (Signed)
Rx sent.  ? Need local RF until mail order arrives.

## 2022-01-12 NOTE — Telephone Encounter (Signed)
Duane Hughes called this morning at 9:20 to request refill of his pramipexole.  Said he requested this a week ago.  He is needing the prescription to be sent to a new pharmacy - [facility] - Mail order, phone is [phone] if you call it in.  He does not have an appt because according to notes he didn't need to return unless symptoms worse.  He is not on any depression medications anymore just this one medication for restless legs.  Please send to the new pharmacy.

## 2022-01-12 NOTE — Telephone Encounter (Signed)
Duplicate   Pt called reporting he talked with someone last week about changing his Pramipexole 0.75 mg 2/d from [place] to [facility] order. Almost out of meds. Pt requesting to call in the Rx will be faster than computer. Contact # for Pt [phone].

## 2022-01-12 NOTE — Telephone Encounter (Signed)
Patient said he had 7 days left. He will call us if mail order RF not received and we can call in to local pharmacy to cover.

## 2022-01-17 ENCOUNTER — Ambulatory Visit: Payer: Medicare HMO | Admitting: Podiatry

## 2022-01-17 DIAGNOSIS — M79674 Pain in right toe(s): Secondary | ICD-10-CM | POA: Diagnosis not present

## 2022-01-17 DIAGNOSIS — M79675 Pain in left toe(s): Secondary | ICD-10-CM

## 2022-01-17 DIAGNOSIS — B351 Tinea unguium: Secondary | ICD-10-CM | POA: Diagnosis not present

## 2022-01-17 DIAGNOSIS — E669 Obesity, unspecified: Secondary | ICD-10-CM

## 2022-01-17 DIAGNOSIS — E1169 Type 2 diabetes mellitus with other specified complication: Secondary | ICD-10-CM

## 2022-01-17 NOTE — Progress Notes (Signed)
This patient returns to my office for at risk foot care.  This patient requires this care by a professional since this patient will be at risk due to having diabetes.  This patient is unable to cut nails himself since the patient cannot reach his nails.These nails are painful walking and wearing shoes.  This patient presents for at risk foot care today.  Patient presents with secondary complaint today of dry skin.  He would like to discuss treatment options he does not moisturize his foot.  He denies any other acute complaints.  General Appearance  Alert, conversant and in no acute stress.  Vascular  Dorsalis pedis and posterior tibial  pulses are palpable  bilaterally.  Capillary return is within normal limits  bilaterally. Temperature is within normal limits  bilaterally.  Neurologic  Senn-Weinstein monofilament wire test within normal limits  bilaterally. Muscle power within normal limits bilaterally.  Nails Thick disfigured discolored nails with subungual debris  from hallux to fifth toes bilaterally. No evidence of bacterial infection or drainage bilaterally.  Orthopedic  No limitations of motion  feet .  No crepitus or effusions noted.  No bony pathology or digital deformities noted.  Skin  normotropic skin with no porokeratosis noted bilaterally.  No signs of infections or ulcers noted.   Dry skin noted to both lower extremity.  Onychomycosis  Pain in right toes  Pain in left toes  Consent was obtained for treatment procedures.   Mechanical debridement of nails 1-5  bilaterally performed with a nail nipper.  Filed with dremel without incident.    Return office visit   10 weeks                   Told patient to return for periodic foot care and evaluation due to potential at risk complications.  Xerosis skin -Clinically improving    Jaelen Gellerman D.P.M. 

## 2022-01-19 ENCOUNTER — Ambulatory Visit: Payer: Medicare HMO | Admitting: Podiatry

## 2022-02-27 ENCOUNTER — Encounter (INDEPENDENT_AMBULATORY_CARE_PROVIDER_SITE_OTHER): Payer: Self-pay

## 2022-03-08 ENCOUNTER — Other Ambulatory Visit: Payer: Medicare HMO

## 2022-03-08 DIAGNOSIS — N138 Other obstructive and reflux uropathy: Secondary | ICD-10-CM

## 2022-03-09 ENCOUNTER — Other Ambulatory Visit: Payer: Medicare HMO

## 2022-03-09 LAB — PSA: Prostate Specific Ag, Serum: 0.2 ng/mL (ref 0.0–4.0)

## 2022-03-13 NOTE — Progress Notes (Unsigned)
4:34 PM   RISHABH RINKENBERGER 1950/07/26 [id number]  Referring provider: Gardiner Sleeper, MD [id number] [place],  [place] [address]  Urological history: 1. ED -contributing factors of age, BPH, DM, HTN, HLD, sleep apnea, depression, antidepressants, pain medication and blood pressure medications -SHIM *** -managed with sildenafil 20 mg, on-demand-dosing  2. BPH with LU TS -PSA (0.2) 03/2022 -I PSS ***   HPI: Duane Hughes is  a 71 y.o. male who presents for a yearly visit.    Patient is not having spontaneous erections.  He denies any pain or curvature with erections.  He is having good results with the sildenafil.        Score: 1-7 Severe ED 8-11 Moderate ED 12-16 Mild-Moderate ED 17-21 Mild ED 22-25 No ED  He is experiencing urinary frequency.  Patient denies any modifying or aggravating factors.  Patient denies any gross hematuria, dysuria or suprapubic/flank pain.  Patient denies any fevers, chills, nausea or vomiting.        Score:  1-7 Mild 8-19 Moderate 20-35 Severe   PMH: Past Medical History:  Diagnosis Date   Anemia    Anxiety    Dr.Jessica Carter . well controlled   Arthritis    back, hands, knees   Colon polyp 05/09/2016   TUBULAR ADENOMAS    Depression    Diabetes mellitus without complication ([place])    type 2   Ectatic abdominal aorta ([place]) 04/18/2016   2.9 cm on [place] 2016, unchanged 2017   History of hiatal hernia    20 years ago   History of skin cancer    hand   Hyperlipidemia    Hypertension 2015   Dr. Sanda Klein   Morbid obesity ([place]) 11/11/2014   Neuropathy    Obesity 11/11/2014   Sleep apnea    cpap   Squamous cell carcinoma of skin 04/11/2018   SCC In Situ R dorsum wrist   Squamous cell carcinoma of skin 06/23/2015   R dorsum hand    Surgical History: Past Surgical History:  Procedure Laterality Date   COLONOSCOPY WITH PROPOFOL N/A 05/09/2016   Procedure: COLONOSCOPY WITH PROPOFOL;  Surgeon: Lucilla Lame, MD;  Location: [facility]  ENDOSCOPY;  Service: Endoscopy;  Laterality: N/A;   HERNIA REPAIR     Hiatal   JOINT REPLACEMENT     04-04-17 Dr. Gladstone Lighter   Right knee   KNEE ARTHROSCOPY     several scopes done   KNEE CLOSED REDUCTION Right 05/31/2017   Procedure: CLOSED MANIPULATION RIGHT KNEE;  Surgeon: Latanya Maudlin, MD;  Location: [facility];  Service: Orthopedics;  Laterality: Right;  Femoral Block   LUMBAR LAMINECTOMY/DECOMPRESSION MICRODISCECTOMY N/A 01/26/2020   Procedure: L3, L4 LAMINECTOMIES;  Surgeon: Deetta Perla, MD;  Location: [facility] ORS;  Service: Neurosurgery;  Laterality: N/A;   MEDIAL PARTIAL KNEE REPLACEMENT Left    2000s?   SKIN CANCER EXCISION  Feb 2017   removed from right hand   TOTAL KNEE ARTHROPLASTY Right 04/04/2017   Procedure: RIGHT TOTAL KNEE ARTHROPLASTY;  Surgeon: Latanya Maudlin, MD;  Location: [facility];  Service: Orthopedics;  Laterality: Right;   TOTAL KNEE ARTHROPLASTY Left 09/24/2018   Procedure: REVISION LEFT KNEE ARTHROPLASTY;  Surgeon: Paralee Cancel, MD;  Location: [facility];  Service: Orthopedics;  Laterality: Left;  70 mins    Home Medications:  Allergies as of 03/14/2022   No Known Allergies      Medication List        Accurate as of March 13, 2022  4:34 PM. If you have any questions, ask your nurse or doctor.          aspirin EC 81 MG tablet Take 81 mg by mouth daily. Swallow whole.   atorvastatin 20 MG tablet Commonly known as: LIPITOR Take 20 mg by mouth daily.   B-12 1000 MCG Subl Place under the tongue.   celecoxib 100 MG capsule Commonly known as: CELEBREX Take 100 mg by mouth daily as needed.   ferrous sulfate 325 (65 FE) MG EC tablet Take 1 tablet (325 mg total) by mouth 3 (three) times a week.   hydrocortisone 2.5 % cream Apply topically daily. 3 times per week   ketoconazole 2 % cream Commonly known as: NIZORAL Apply 1 application topically daily. 3 times per week   lisinopril 5 MG tablet Commonly known as: ZESTRIL Take 0.5 tablets (2.5 mg total)  by mouth daily.   metFORMIN 500 MG tablet Commonly known as: GLUCOPHAGE Take 2 tablets with breakfast and 1 tablet with dinner daily.   naproxen 500 MG tablet Commonly known as: Naprosyn Take 1 tablet (500 mg total) by mouth 2 (two) times daily with a meal.   pramipexole 0.75 MG tablet Commonly known as: MIRAPEX Take 1 tablet (0.75 mg total) by mouth 2 (two) times daily.   pseudoephedrine 30 MG tablet Commonly known as: SUDAFED Take 30 mg by mouth every 4 (four) hours as needed for congestion.   sildenafil 20 MG tablet Commonly known as: REVATIO TAKE 3-5 TABLETS 2 HOURS BEFORE INTERCOURSE ON AN EMPTY STOMACH DO NOT TAKE WITH NITRATES   SLEEP PO Take by mouth.   Vitamin D (Ergocalciferol) 50 MCG (2000 UT) Caps   Vitamin D 50 MCG (2000 UT) tablet Take 2,000 Units by mouth daily.        Allergies: No Known Allergies  Family History: Family History  Problem Relation Age of Onset   Alcohol abuse Mother    Rheum arthritis Mother    Stroke Maternal Grandmother    Stroke Paternal Grandfather    Dementia Paternal Grandmother    Cancer Neg Hx    Diabetes Neg Hx    Heart disease Neg Hx    Hypertension Neg Hx    Kidney disease Neg Hx    Prostate cancer Neg Hx     Social History:  reports that he has never smoked. He has never used smokeless tobacco. He reports that he does not drink alcohol and does not use drugs.  ROS: For pertinent review of systems please refer to history of present illness  Physical Exam: There were no vitals taken for this visit.  Constitutional:  Well nourished. Alert and oriented, No acute distress. HEENT: [place] AT, mask in place.  Trachea midline Cardiovascular: No clubbing, cyanosis, or edema. Respiratory: Normal respiratory effort, no increased work of breathing. GU: No CVA tenderness.  No bladder fullness or masses.  Patient with circumcised phallus.   Urethral meatus is patent.  No penile discharge. No penile lesions or rashes. Scrotum  without lesions, cysts, rashes and/or edema.  Testicles are located scrotally bilaterally. No masses are appreciated in the testicles. Left and right epididymis are normal. Rectal: Patient with  normal sphincter tone. Anus and perineum without scarring or rashes. No rectal masses are appreciated. Prostate is approximately 50 + grams, could only palpate the apex, no nodules are appreciated. Seminal vesicles could not be palpated Neurologic: Grossly intact, no focal deficits, moving all 4 extremities. Psychiatric: Normal mood and affect.  Laboratory Data: Color Colorless, Straw, Light Yellow, Yellow, Dark Yellow Light Yellow  Clarity Clear Clear  Specific Gravity 1.005 - 1.030 1.025  pH, Urine 5.0 - 8.0 5.0  Protein, Urinalysis Negative Negative  Glucose, Urinalysis Negative Negative  Ketones, Urinalysis Negative Negative  Blood, Urinalysis Negative Negative  Nitrite, Urinalysis Negative Negative  Leukocytes, Urinalysis Negative Negative  Bilirubin, Urinalysis Negative Negative  Urobilinogen, Urinalysis 0.2 - 1.0 mg/dL 0.2  Resulting Agency  [facility] CLINICAL LABORATORY  Narrative Performed by [place] Reporting of microscopic urinalysis (UA) bacteria and yeast results was discontinued on August 30th, 2021 due to poor clinical predictive value for urinary tract infection (UTI). Providers should use other clinical signs and symptoms to diagnose UTI including urine leukocyte esterase, white blood cell count, and culture in accordance with local and national guidelines. Additional information and rationale can obtained by emailing [email or web address].  Specimen Collected: 02/14/22 14:28   Performed by: [place] Resulted: 02/14/22 15:22  Received From: [place]  Result Received: 03/06/22 14:01   WBC (White Blood Cell Count) 3.2 - 9.8 x10^9/L 6.4  Hemoglobin 13.7 - 17.3 g/dL 15.3  Hematocrit 39.0 - 49.0 % 46.2  Platelets 150 - 450  x10^9/L 225  MCV (Mean Corpuscular Volume) 80 - 98 fL 96  MCH (Mean Corpuscular Hemoglobin) 26.5 - 34.0 pg 31.9  MCHC (Mean Corpuscular Hemoglobin Concentration) 31.5 - 36.3 % 33.1  RBC (Red Blood Cell Count) 4.37 - 5.74 x10^12/L 4.80  RDW-CV (Red Cell Distribution Width) 11.5 - 14.5 % 12.7  NRBC (Nucleated Red Blood Cell Count) 0 x10^9/L 0.00  NRBC % (Nucleated Red Blood Cell %) % 0.0  MPV (Mean Platelet Volume) 7.2 - 11.7 fL 9.1  Neutrophil Count 2.0 - 8.6 x10^9/L 4.6  Neutrophil % 37 - 80 % 71.0  Lymphocyte Count 0.6 - 4.2 x10^9/L 1.2  Lymphocyte % 10 - 50 % 19.0  Monocyte Count 0 - 0.9 x10^9/L 0.4  Monocyte % 0 - 12 % 6.5  Eosinophil Count 0 - 0.70 x10^9/L 0.19  Eosinophil % 0 - 7 % 3.0  Basophil Count 0 - 0.20 x10^9/L 0.02  Basophil % 0 - 2 % 0.3  Immature Granulocyte Count <=0.06 x10^9/L 0.01  Immature Granulocyte % <=0.7 % 0.2  Resulting Agency  [facility] CLINICAL LAB[facility]   Specimen Collected: 02/14/22 14:28   Performed by: [place] Last Resulted: 02/14/22 14:55  Received From: [place]  Result Received: 03/06/22 14:01   Sodium 135 - 145 mmol/L 140  Potassium 3.5 - 5.0 mmol/L 3.8  Chloride 98 - 108 mmol/L 109 High   Carbon Dioxide (CO2) 21 - 30 mmol/L 24  Urea Nitrogen (BUN) 7 - 20 mg/dL 13  Creatinine 0.6 - 1.3 mg/dL 0.9  Glucose 70 - 140 mg/dL 154 High   Comment: Interpretive Data: Above is the NONFASTING reference range.  Below are the FASTING reference ranges: NORMAL:      70-99 mg/dL PREDIABETES: 100-125 mg/dL DIABETES:    > 125 mg/dL  Calcium 8.7 - 10.2 mg/dL 9.0  Anion Gap 3 - 12 mmol/L 7  BUN/CREA Ratio 6 - 27 14  Glomerular Filtration Rate (eGFR) mL/min/1.73sq m 91  Comment: CKD-EPI (2021) does not include patient's race in the calculation of eGFR. Monitoring changes of plasma creatinine and eGFR over time is useful for monitoring kidney function.  This change was made on 06/29/2020.  Interpretive Ranges for eGFR(CKD-EPI  2021):  eGFR:              >  60 mL/min/1.73 sq m - Normal eGFR:              30 - 59 mL/min/1.73 sq m - Moderately Decreased eGFR:              15 - 29 mL/min/1.73 sq m - Severely Decreased eGFR:              < 15 mL/min/1.73 sq m -  Kidney Failure   Note: These eGFR calculations do not apply in acute situations when eGFR is changing rapidly or in patients on dialysis.  Resulting Agency  [facility]   Specimen Collected: 02/14/22 14:28   Performed by: [place] Resulted: 02/14/22 15:11  Received From: [place]  Result Received: 03/06/22 14:01   Sodium 135 - 145 mmol/L 140  Potassium 3.5 - 5.0 mmol/L 4.1  Chloride 98 - 108 mmol/L 108  Carbon Dioxide (CO2) 21 - 30 mmol/L 23  Urea Nitrogen (BUN) 7 - 20 mg/dL 11  Creatinine 0.6 - 1.3 mg/dL 0.9  Glucose 70 - 140 mg/dL 151 High   Comment: Interpretive Data: Above is the NONFASTING reference range.  Below are the FASTING reference ranges: NORMAL:      70-99 mg/dL PREDIABETES: 100-125 mg/dL DIABETES:    > 125 mg/dL  Calcium 8.7 - 10.2 mg/dL 8.9  Anion Gap 3 - 12 mmol/L 9  BUN/CREA Ratio 6 - 27 13  Glomerular Filtration Rate (eGFR) mL/min/1.73sq m 91  Comment: CKD-EPI (2021) does not include patient's race in the calculation of eGFR. Monitoring changes of plasma creatinine and eGFR over time is useful for monitoring kidney function.  This change was made on 06/29/2020.  Interpretive Ranges for eGFR(CKD-EPI 2021):  eGFR:              > 60 mL/min/1.73 sq m - Normal eGFR:              30 - 59 mL/min/1.73 sq m - Moderately Decreased eGFR:              15 - 29 mL/min/1.73 sq m - Severely Decreased eGFR:              < 15 mL/min/1.73 sq m -  Kidney Failure   Note: These eGFR calculations do not apply in acute situations when eGFR is changing rapidly or in patients on dialysis.  Resulting Agency  [place] [facility]   Specimen Collected: 02/28/22 04:35   Performed by: [place] Resulted: 02/28/22 05:26  Received From: [place]  Result Received: 03/06/22 14:01   Hemoglobin A1C <5.7 % 7.2 High   Average Blood Glucose (Calculated From HgBA1c Level) mg/dL 162  Resulting Agency  [place]  Narrative Performed by [place] Between 5.7% and 6.4% is suggestive of Pre-Diabetes or controlled Diabetes. Greater than or equal to 6.5% is suggestive of Diabetes, and if more than one value, diagnostic.  Accuracy may be reduced by anemia, hemoglobinopathy, recent transfusion, sickle cell, artificial heart valve, dialysis, TIPS, severe hyperglycemia, etc.  Specimen Collected: 02/14/22 14:28   Performed by: [place]: 02/14/22 17:27  Received From: [place]  Result Received: 03/06/22 14:01   WBC (White Blood Cell Count) 3.2 - 9.8 x10^9/L 11.3 High   Hemoglobin 13.7 - 17.3 g/dL 13.9  Hematocrit 39.0 - 49.0 % 41.9  Platelets 150 - 450 x10^9/L 264  MCV (Mean Corpuscular Volume) 80 -  98 fL 97  MCH (Mean Corpuscular Hemoglobin) 26.5 - 34.0 pg 32.1  MCHC (Mean Corpuscular Hemoglobin Concentration) 31.5 - 36.3 % 33.2  RBC (Red Blood Cell Count) 4.37 - 5.74 x10^12/L 4.33 Low   RDW-CV (Red Cell Distribution Width) 11.5 - 14.5 % 12.5  NRBC (Nucleated Red Blood Cell Count) 0 x10^9/L 0.00  NRBC % (Nucleated Red Blood Cell %) % 0.0  MPV (Mean Platelet Volume) 7.2 - 11.7 fL 9.0  Resulting Agency  [facility] CLINICAL LABORAT[facility]   Specimen Collected: 02/28/22 04:35   Performed by: [place] Resulted: 02/28/22 05:12  Received From: [place]  Result Received: 03/06/22 14:01  I have reviewed the labs.  Pertinent Imaging N/A   Assessment & Plan:    1. Erectile dysfunction -continue sildenafil  2. BPH with LUTS -PSA stable -DRE benign -continue conservative management, avoiding bladder irritants and timed  voiding's    No follow-ups on file.  These notes generated with voice recognition software. I apologize for typographical errors.  [place] [address] [place], [place] [address] [phone]

## 2022-03-14 ENCOUNTER — Ambulatory Visit: Payer: Medicare HMO | Admitting: Urology

## 2022-03-14 ENCOUNTER — Encounter: Payer: Self-pay | Admitting: Urology

## 2022-03-14 VITALS — BP 144/79 | HR 84 | Ht 68.0 in | Wt 265.0 lb

## 2022-03-14 DIAGNOSIS — N138 Other obstructive and reflux uropathy: Secondary | ICD-10-CM

## 2022-03-14 DIAGNOSIS — N401 Enlarged prostate with lower urinary tract symptoms: Secondary | ICD-10-CM

## 2022-03-14 DIAGNOSIS — N529 Male erectile dysfunction, unspecified: Secondary | ICD-10-CM | POA: Diagnosis not present

## 2022-03-14 MED ORDER — SILDENAFIL CITRATE 100 MG PO TABS: 100.0000 mg | ORAL_TABLET | Freq: Every day | ORAL | 3 refills | Status: DC | PRN

## 2022-04-04 ENCOUNTER — Ambulatory Visit: Payer: Medicare HMO | Admitting: Podiatry

## 2022-04-04 VITALS — BP 140/80

## 2022-04-04 DIAGNOSIS — M79674 Pain in right toe(s): Secondary | ICD-10-CM

## 2022-04-04 DIAGNOSIS — E1169 Type 2 diabetes mellitus with other specified complication: Secondary | ICD-10-CM | POA: Diagnosis not present

## 2022-04-04 DIAGNOSIS — E669 Obesity, unspecified: Secondary | ICD-10-CM

## 2022-04-04 DIAGNOSIS — M79675 Pain in left toe(s): Secondary | ICD-10-CM | POA: Diagnosis not present

## 2022-04-04 DIAGNOSIS — B351 Tinea unguium: Secondary | ICD-10-CM

## 2022-04-12 ENCOUNTER — Ambulatory Visit: Payer: Medicare HMO | Admitting: Dermatology

## 2022-04-12 ENCOUNTER — Encounter: Payer: Self-pay | Admitting: Dermatology

## 2022-04-12 VITALS — BP 127/73 | HR 91

## 2022-04-12 DIAGNOSIS — Z79899 Other long term (current) drug therapy: Secondary | ICD-10-CM

## 2022-04-12 DIAGNOSIS — L820 Inflamed seborrheic keratosis: Secondary | ICD-10-CM | POA: Diagnosis not present

## 2022-04-12 DIAGNOSIS — L578 Other skin changes due to chronic exposure to nonionizing radiation: Secondary | ICD-10-CM | POA: Diagnosis not present

## 2022-04-12 DIAGNOSIS — Z5111 Encounter for antineoplastic chemotherapy: Secondary | ICD-10-CM

## 2022-04-12 DIAGNOSIS — L570 Actinic keratosis: Secondary | ICD-10-CM

## 2022-04-12 NOTE — Patient Instructions (Addendum)
- Start 5-fluorouracil/calcipotriene cream twice a day for 7 days to affected areas including arms, hands, forehead, temples. Can treat sections at a time. Prescription sent to [facility]. Patient advised they will receive an email to purchase the medication online and have it sent to their home. Patient provided with handout reviewing treatment course and side effects and advised to call or message [place] on MyChart with any concerns. Start end of January   Instructions for [facility]  One or more of your medications was sent to the [facility] mail order compounding pharmacy. You will receive an email from them and can purchase the medicine through that link. It will then be mailed to your home at the address you confirmed. If for any reason you do not receive an email from them, please check your spam folder. If you still do not find the email, please let us know. [facility] phone number is [phone].    5-Fluorouracil/Calcipotriene Patient Education   Actinic keratoses are the dry, red scaly spots on the skin caused by sun damage. A portion of these spots can turn into skin cancer with time, and treating them can help prevent development of skin cancer.   Treatment of these spots requires removal of the defective skin cells. There are various ways to remove actinic keratoses, including freezing with liquid nitrogen, treatment with creams, or treatment with a blue light procedure in the office.   5-fluorouracil cream is a topical cream used to treat actinic keratoses. It works by interfering with the growth of abnormal fast-growing skin cells, such as actinic keratoses. These cells peel off and are replaced by healthy ones.   5-fluorouracil/calcipotriene is a combination of the 5-fluorouracil cream with a vitamin D analog cream called calcipotriene. The calcipotriene alone does not treat actinic keratoses. However, when it is combined with  5-fluorouracil, it helps the 5-fluorouracil treat the actinic keratoses much faster so that the same results can be achieved with a much shorter treatment time.  INSTRUCTIONS FOR 5-FLUOROURACIL/CALCIPOTRIENE CREAM:   5-fluorouracil/calcipotriene cream typically only needs to be used for 4-7 days. A thin layer should be applied twice a day to the treatment areas recommended by your physician.   If your physician prescribed you separate tubes of 5-fluourouracil and calcipotriene, apply a thin layer of 5-fluorouracil followed by a thin layer of calcipotriene.   Avoid contact with your eyes, nostrils, and mouth. Do not use 5-fluorouracil/calcipotriene cream on infected or open wounds.   You will develop redness, irritation and some crusting at areas where you have pre-cancer damage/actinic keratoses. IF YOU DEVELOP PAIN, BLEEDING, OR SIGNIFICANT CRUSTING, STOP THE TREATMENT EARLY - you have already gotten a good response and the actinic keratoses should clear up well.  Wash your hands after applying 5-fluorouracil 5% cream on your skin.   A moisturizer or sunscreen with a minimum SPF 30 should be applied each morning.   Once you have finished the treatment, you can apply a thin layer of Vaseline twice a day to irritated areas to soothe and calm the areas more quickly. If you experience significant discomfort, contact your physician.  For some patients it is necessary to repeat the treatment for best results.  SIDE EFFECTS: When using 5-fluorouracil/calcipotriene cream, you may have mild irritation, such as redness, dryness, swelling, or a mild burning sensation. This usually resolves within 2 weeks. The more actinic keratoses you have, the more redness and inflammation you can expect during treatment. Eye irritation has been reported  rarely. If this occurs, please let us know.  If you have any trouble using this cream, please call the office. If you have any other questions about this information,  please do not hesitate to ask me before you leave the office.   Cryotherapy Aftercare  Wash gently with soap and water everyday.   Apply Vaseline and Band-Aid daily until healed.   Due to recent changes in healthcare laws, you may see results of your pathology and/or laboratory studies on MyChart before the doctors have had a chance to review them. We understand that in some cases there may be results that are confusing or concerning to you. Please understand that not all results are received at the same time and often the doctors may need to interpret multiple results in order to provide you with the best plan of care or course of treatment. Therefore, we ask that you please give [place] 2 business days to thoroughly review all your results before contacting the office for clarification. Should we see a critical lab result, you will be contacted sooner.   If You Need Anything After Your Visit  If you have any questions or concerns for your doctor, please call our main line at [phone] and press option 4 to reach your doctor's medical assistant. If no one answers, please leave a voicemail as directed and we will return your call as soon as possible. Messages left after 4 pm will be answered the following business day.   You may also send [place] a message via [place]. We typically respond to MyChart messages within 1-2 business days.  For prescription refills, please ask your pharmacy to contact our office. Our fax number is [phone].  If you have an urgent issue when the clinic is closed that cannot wait until the next business day, you can page your doctor at the number below.    Please note that while we do our best to be available for urgent issues outside of office hours, we are not available 24/7.   If you have an urgent issue and are unable to reach [place], you may choose to seek medical care at your doctor's office, retail clinic, urgent care center, or emergency room.  If you have a  medical emergency, please immediately call 911 or go to the emergency department.  Pager Numbers  - Dr. Nehemiah Massed: [phone]  - Dr. Laurence Ferrari: [phone]  - Dr. Nicole Kindred: [phone]  In the event of inclement weather, please call our main line at [phone] for an update on the status of any delays or closures.  Dermatology Medication Tips: Please keep the boxes that topical medications come in in order to help keep track of the instructions about where and how to use these. Pharmacies typically print the medication instructions only on the boxes and not directly on the medication tubes.   If your medication is too expensive, please contact our office at [phone] option 4 or send [place] a message through [place].   We are unable to tell what your co-pay for medications will be in advance as this is different depending on your insurance coverage. However, we may be able to find a substitute medication at lower cost or fill out paperwork to get insurance to cover a needed medication.   If a prior authorization is required to get your medication covered by your insurance company, please allow [place] 1-2 business days to complete this process.  Drug prices often vary depending on where the prescription is filled and some  pharmacies may offer cheaper prices.  The website www.goodrx.com contains coupons for medications through different pharmacies. The prices here do not account for what the cost may be with help from insurance (it may be cheaper with your insurance), but the website can give you the price if you did not use any insurance.  - You can print the associated coupon and take it with your prescription to the pharmacy.  - You may also stop by our office during regular business hours and pick up a GoodRx coupon card.  - If you need your prescription sent electronically to a different pharmacy, notify our office through [facility] or by phone at [phone] option 4.     Si  Usted Necesita Algo Despus de Su Visita  Tambin puede enviarnos un mensaje a travs de Pharmacist, community. Por lo general respondemos a los mensajes de MyChart en el transcurso de 1 a 2 das hbiles.  Para renovar recetas, por favor pida a su farmacia que se ponga en contacto con nuestra oficina. Harland Dingwall de fax es [place] [phone].  Si tiene un asunto urgente cuando la clnica est cerrada y que no puede esperar hasta el siguiente da hbil, puede llamar/localizar a su doctor(a) al nmero que aparece a continuacin.   Por favor, tenga en cuenta que aunque hacemos todo lo posible para estar disponibles para asuntos urgentes fuera del horario de [place], no estamos disponibles las 24 horas del da, los 7 das de la [place].   Si tiene un problema urgente y no puede comunicarse con nosotros, puede optar por buscar atencin mdica  en el consultorio de su doctor(a), en una clnica privada, en un centro de atencin urgente o en una sala de emergencias.  Si tiene Engineering geologist, por favor llame inmediatamente al 911 o vaya a la sala de emergencias.  Nmeros de bper  - Dr. Nehemiah Massed: [phone]  - Dra. Moye: [phone]  - Dra. Nicole Kindred: [phone]  En caso de inclemencias del [place], por favor llame a Johnsie Kindred principal al [phone] para una actualizacin sobre el [place] de cualquier retraso o cierre.  Consejos para la medicacin en dermatologa: Por favor, guarde las cajas en las que vienen los medicamentos de uso tpico para ayudarle a seguir las instrucciones sobre dnde y cmo usarlos. Las farmacias generalmente imprimen las instrucciones del medicamento slo en las cajas y no directamente en los tubos del [place].   Si su medicamento es muy caro, por favor, pngase en contacto con Zigmund Daniel llamando al [phone] y presione la opcin 4 o envenos un mensaje a travs de Pharmacist, community.   No podemos decirle cul ser su copago por los medicamentos por adelantado ya que  esto es diferente dependiendo de la cobertura de su seguro. Sin embargo, es posible que podamos encontrar un medicamento sustituto a Electrical engineer un formulario para que el seguro cubra el medicamento que se considera necesario.   Si se requiere una autorizacin previa para que su compaa de seguros [place] su medicamento, por favor permtanos de 1 a 2 das hbiles para completar este proceso.  Los precios de los medicamentos varan con frecuencia dependiendo del Environmental consultant de dnde se surte la receta y alguna farmacias pueden ofrecer precios ms baratos.  El sitio web www.goodrx.com tiene cupones para medicamentos de Airline pilot. Los precios aqu no tienen en cuenta lo que podra costar con la ayuda del seguro (puede ser ms barato con su seguro), pero el sitio web puede darle el precio si  utiliz ningn seguro.  - Puede imprimir el cupn correspondiente y llevarlo con su receta a la farmacia.  - Tambin puede pasar por nuestra oficina durante el horario de atencin regular y recoger una tarjeta de cupones de GoodRx.  - Si necesita que su receta se enve electrnicamente a una farmacia diferente, informe a nuestra oficina a travs de MyChart de [place] o por telfono llamando al [phone] y presione la opcin 4.  

## 2022-04-12 NOTE — Progress Notes (Signed)
Follow-Up Visit   Subjective  Duane Hughes is a 71 y.o. male who presents for the following: Actinic Keratosis (7 month recheck. Face, arms, hands. Tx with LN2 at last). The patient has spots, moles and lesions to be evaluated, some may be new or changing and the patient has concerns that these could be cancer.  The following portions of the chart were reviewed this encounter and updated as appropriate:  Tobacco  Allergies  Meds  Problems  Med Hx  Surg Hx  Fam Hx     Review of Systems: No other skin or systemic complaints except as noted in HPI or Assessment and Plan.  Objective  Well appearing patient in no apparent distress; mood and affect are within normal limits.  A focused examination was performed including face, neck, arms, hands. Relevant physical exam findings are noted in the Assessment and Plan.  face x10, right arm x3, left arm x10 (23) Erythematous thin papules/macules with gritty scale.   Left Forearm x5 (5) Erythematous keratotic or waxy stuck-on papule or plaque.   Assessment & Plan  AK (actinic keratosis) (23) face x10, right arm x3, left arm x10  Actinic keratoses are precancerous spots that appear secondary to cumulative UV radiation exposure/sun exposure over time. They are chronic with expected duration over 1 year. A portion of actinic keratoses will progress to squamous cell carcinoma of the skin. It is not possible to reliably predict which spots will progress to skin cancer and so treatment is recommended to prevent development of skin cancer.  Recommend daily broad spectrum sunscreen SPF 30+ to sun-exposed areas, reapply every 2 hours as needed.  Recommend staying in the shade or wearing long sleeves, sun glasses (UVA+UVB protection) and wide brim hats (4-inch brim around the entire circumference of the hat). Call for new or changing lesions.  Destruction of lesion - face x10, right arm x3, left arm x10 Complexity: simple   Destruction method:  cryotherapy   Informed consent: discussed and consent obtained   Timeout:  patient name, date of birth, surgical site, and procedure verified Lesion destroyed using liquid nitrogen: Yes   Region frozen until ice ball extended beyond lesion: Yes   Outcome: patient tolerated procedure well with no complications   Post-procedure details: wound care instructions given   Additional details:  Prior to procedure, discussed risks of blister formation, small wound, skin dyspigmentation, or rare scar following cryotherapy. Recommend Vaseline ointment to treated areas while healing.   Inflamed seborrheic keratosis (5) Left Forearm x5  Symptomatic, irritating, patient would like treated.  Destruction of lesion - Left Forearm x5 Complexity: simple   Destruction method: cryotherapy   Informed consent: discussed and consent obtained   Timeout:  patient name, date of birth, surgical site, and procedure verified Lesion destroyed using liquid nitrogen: Yes   Region frozen until ice ball extended beyond lesion: Yes   Outcome: patient tolerated procedure well with no complications   Post-procedure details: wound care instructions given   Additional details:  Prior to procedure, discussed risks of blister formation, small wound, skin dyspigmentation, or rare scar following cryotherapy. Recommend Vaseline ointment to treated areas while healing.   Actinic Damage with PreCancerous Actinic Keratoses Counseling for Topical Chemotherapy Management: Patient exhibits: - Severe, confluent actinic changes with pre-cancerous actinic keratoses that is secondary to cumulative UV radiation exposure over time - Condition that is severe; chronic, not at goal. - diffuse scaly erythematous macules and papules with underlying dyspigmentation - Discussed Prescription "Field Treatment" topical  Chemotherapy for Severe, Chronic Confluent Actinic Changes with Pre-Cancerous Actinic Keratoses Field treatment involves treatment of  an entire area of skin that has confluent Actinic Changes (Sun/ Ultraviolet light damage) and PreCancerous Actinic Keratoses by method of PhotoDynamic Therapy (PDT) and/or prescription Topical Chemotherapy agents such as 5-fluorouracil, 5-fluorouracil/calcipotriene, and/or imiquimod.  The purpose is to decrease the number of clinically evident and subclinical PreCancerous lesions to prevent progression to development of skin cancer by chemically destroying early precancer changes that may or may not be visible.  It has been shown to reduce the risk of developing skin cancer in the treated area. As a result of treatment, redness, scaling, crusting, and open sores may occur during treatment course. One or more than one of these methods may be used and may have to be used several times to control, suppress and eliminate the PreCancerous changes. Discussed treatment course, expected reaction, and possible side effects. - Recommend daily broad spectrum sunscreen SPF 30+ to sun-exposed areas, reapply every 2 hours as needed.  - Staying in the shade or wearing long sleeves, sun glasses (UVA+UVB protection) and wide brim hats (4-inch brim around the entire circumference of the hat) are also recommended. - Call for new or changing lesions.  - Start 5-fluorouracil/calcipotriene cream twice a day for 7 days to affected areas including arms, hands, forehead, temples. Can treat sections at a time. Prescription sent to [facility]. Patient advised they will receive an email to purchase the medication online and have it sent to their home. Patient provided with handout reviewing treatment course and side effects and advised to call or message [place] on MyChart with any concerns. Start end of January  Reviewed course of treatment and expected reaction.  Patient advised to expect inflammation and crusting and advised that erosions are possible.  Patient advised to be diligent with sun protection during  and after treatment. Counseled to keep medication out of reach of children and pets.   Return in about 3 months (around 07/12/2022) for AK Follow Up.  I, Emelia Salisbury, CMA, am acting as scribe for Sarina Ser, MD. Documentation: I have reviewed the above documentation for accuracy and completeness, and I agree with the above.  Sarina Ser, MD

## 2022-04-18 ENCOUNTER — Ambulatory Visit: Payer: Medicare HMO | Admitting: Podiatry

## 2022-04-18 NOTE — Progress Notes (Signed)
This patient returns to my office for at risk foot care.  This patient requires this care by a professional since this patient will be at risk due to having diabetes.  This patient is unable to cut nails himself since the patient cannot reach his nails.These nails are painful walking and wearing shoes.  This patient presents for at risk foot care today.  Patient presents with secondary complaint today of dry skin.  He would like to discuss treatment options he does not moisturize his foot.  He denies any other acute complaints.  General Appearance  Alert, conversant and in no acute stress.  Vascular  Dorsalis pedis and posterior tibial  pulses are palpable  bilaterally.  Capillary return is within normal limits  bilaterally. Temperature is within normal limits  bilaterally.  Neurologic  Senn-Weinstein monofilament wire test within normal limits  bilaterally. Muscle power within normal limits bilaterally.  Nails Thick disfigured discolored nails with subungual debris  from hallux to fifth toes bilaterally. No evidence of bacterial infection or drainage bilaterally.  Orthopedic  No limitations of motion  feet .  No crepitus or effusions noted.  No bony pathology or digital deformities noted.  Skin  normotropic skin with no porokeratosis noted bilaterally.  No signs of infections or ulcers noted.   Dry skin noted to both lower extremity.  Onychomycosis  Pain in right toes  Pain in left toes  Consent was obtained for treatment procedures.   Mechanical debridement of nails 1-5  bilaterally performed with a nail nipper.  Filed with dremel without incident.    Return office visit   10 weeks                   Told patient to return for periodic foot care and evaluation due to potential at risk complications.  Xerosis skin -Clinically improving    Boneta Lucks D.P.M.

## 2022-04-27 ENCOUNTER — Encounter: Payer: Self-pay | Admitting: Dermatology

## 2022-06-13 ENCOUNTER — Other Ambulatory Visit: Payer: Self-pay | Admitting: Psychiatry

## 2022-06-13 DIAGNOSIS — F32A Depression, unspecified: Secondary | ICD-10-CM

## 2022-06-13 NOTE — Telephone Encounter (Signed)
Please call to schedule an appt. F/U is as needed, but has been almost a year since he has been seen.

## 2022-06-22 NOTE — Telephone Encounter (Signed)
Pt has appt 3/21

## 2022-07-07 ENCOUNTER — Ambulatory Visit: Payer: Medicare HMO | Admitting: Podiatry

## 2022-07-07 DIAGNOSIS — M79675 Pain in left toe(s): Secondary | ICD-10-CM | POA: Diagnosis not present

## 2022-07-07 DIAGNOSIS — E1169 Type 2 diabetes mellitus with other specified complication: Secondary | ICD-10-CM | POA: Diagnosis not present

## 2022-07-07 DIAGNOSIS — M79674 Pain in right toe(s): Secondary | ICD-10-CM

## 2022-07-07 DIAGNOSIS — B351 Tinea unguium: Secondary | ICD-10-CM | POA: Diagnosis not present

## 2022-07-07 DIAGNOSIS — E669 Obesity, unspecified: Secondary | ICD-10-CM

## 2022-07-07 NOTE — Progress Notes (Signed)
This patient returns to my office for at risk foot care.  This patient requires this care by a professional since this patient will be at risk due to having diabetes.  This patient is unable to cut nails himself since the patient cannot reach his nails.These nails are painful walking and wearing shoes.  This patient presents for at risk foot care today.  Patient presents with secondary complaint today of dry skin.  He would like to discuss treatment options he does not moisturize his foot.  He denies any other acute complaints.  General Appearance  Alert, conversant and in no acute stress.  Vascular  Dorsalis pedis and posterior tibial  pulses are palpable  bilaterally.  Capillary return is within normal limits  bilaterally. Temperature is within normal limits  bilaterally.  Neurologic  Senn-Weinstein monofilament wire test within normal limits  bilaterally. Muscle power within normal limits bilaterally.  Nails Thick disfigured discolored nails with subungual debris  from hallux to fifth toes bilaterally. No evidence of bacterial infection or drainage bilaterally.  Orthopedic  No limitations of motion  feet .  No crepitus or effusions noted.  No bony pathology or digital deformities noted.  Skin  normotropic skin with no porokeratosis noted bilaterally.  No signs of infections or ulcers noted.   Dry skin noted to both lower extremity.  Onychomycosis  Pain in right toes  Pain in left toes  Consent was obtained for treatment procedures.   Mechanical debridement of nails 1-5  bilaterally performed with a nail nipper.  Filed with dremel without incident.    Return office visit   10 weeks                   Told patient to return for periodic foot care and evaluation due to potential at risk complications.  Xerosis skin -Clinically improving    Azell Bill D.P.M. 

## 2022-07-18 ENCOUNTER — Ambulatory Visit: Payer: Medicare HMO | Admitting: Dermatology

## 2022-07-20 ENCOUNTER — Encounter: Payer: Self-pay | Admitting: Psychiatry

## 2022-07-20 ENCOUNTER — Ambulatory Visit: Payer: Medicare HMO | Admitting: Psychiatry

## 2022-07-20 DIAGNOSIS — F325 Major depressive disorder, single episode, in full remission: Secondary | ICD-10-CM | POA: Diagnosis not present

## 2022-07-20 DIAGNOSIS — F32A Depression, unspecified: Secondary | ICD-10-CM

## 2022-07-20 MED ORDER — PRAMIPEXOLE DIHYDROCHLORIDE 0.75 MG PO TABS: 0.7500 mg | ORAL_TABLET | Freq: Two times a day (BID) | ORAL | 3 refills | Status: DC

## 2022-07-20 NOTE — Progress Notes (Signed)
Duane Hughes [id number] 1950/12/29 72 y.o.  Subjective:   Patient ID:  Duane Hughes is a 72 y.o. (DOB 11-07-1950) male.  Chief Complaint:  Chief Complaint  Patient presents with   Follow-up    RLS, depression     HPI Duane Hughes presents to the office today for follow-up of depression and RLS. He reports that he had TMS treatments and this seemed to be helpful. He reports that his depression remains in remission. He reports that he occasionally notices some frustration. Denies anxiety. Energy and motivation have been good. He has been Door Dashing again. Sleep is "not great." He reports that he has not required much sleep throughout his lifetime. He reports that he has some middle of the night awakenings. Lost about 15 lbs after starting Ozempic. He reports that appetite has been less. He reports adequate concentration. Denies SI.   He reports that RLS is "contained" and has had only a couple of episodes of RLS in the last year.   Recently had neck surgery.   He plays golf. 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    Past Psychiatric Medication Trials: Pristiq-ineffective Effexor-effective for 2 years then no longer effective Bupropion-taken for years Cymbalta-Not effective Sertraline- Not effective. Loose stools. Celexa Trintellix-Ineffective Abilify-not effective at 5 mg and increased emotional lability at 10 mg Lamictal Lithium Pramipexole- effective for mood signs and symptoms Trazodone  GAD-7    Flowsheet Row Office Visit from 09/16/2020 in [facility] Office Visit from 11/04/2019 in [facility]  Total GAD-7 Score 1 0      PHQ2-9    [place] Office Visit from 09/16/2020 in [place] from [facility]/20/2020 in [facility] Office Visit from 11/04/2019 in [facility] Office Visit from  09/18/2019 in [facility] Office Visit from 09/04/2019 in [facility]  PHQ-2 Total Score 1 0 0 0 0  PHQ-9 Total Score 3 -- 0 0 0      [place] [facility] from 06/15/2021 in [facility] [place] No Risk        Review of Systems:  Review of Systems  Constitutional:  Positive for fatigue.  Musculoskeletal:        Recent neck surgery  Neurological:  Negative for dizziness.       Neuropathy  Psychiatric/Behavioral:         Please refer to HPI    Medications: I have reviewed the patient's current medications.  Current Outpatient Medications  Medication Sig Dispense Refill   aspirin EC 81 MG tablet Take 81 mg by mouth daily. Swallow whole.     atorvastatin (LIPITOR) 20 MG tablet Take 20 mg by mouth daily.     Calcium Carb-Cholecalciferol (HM CALCIUM-VITAMIN D) 600-20 MG-MCG TABS Take by mouth.     Cyanocobalamin (B-12) 5000 MCG SUBL Place under the tongue.     ferrous sulfate 325 (65 FE) MG EC tablet Take 1 tablet (325 mg total) by mouth 3 (three) times a week. 36 tablet 3   hydrocortisone 2.5 % cream Apply topically daily. 3 times per week 30 g 11   ketoconazole (NIZORAL) 2 % cream Apply 1 application topically daily. 3 times per week 60 g 11   lisinopril (ZESTRIL) 5 MG tablet Take 0.5 tablets (2.5 mg total) by mouth daily. 45 tablet  3   Melaton-Thean-Cham-PassF-LBalm (SLEEP PO) Take by mouth.     metFORMIN (GLUCOPHAGE) 500 MG tablet Take 2 tablets with breakfast and 1 tablet with dinner daily. 270 tablet 3   OZEMPIC, 1 MG/DOSE, 4 MG/3ML SOPN Inject into the skin.     sildenafil (VIAGRA) 100 MG tablet Take 1 tablet (100 mg total) by mouth daily as needed for erectile dysfunction. 30 tablet 3   Vitamin D, Ergocalciferol, 50 MCG (2000 UT) CAPS      Cholecalciferol (VITAMIN D) 50 MCG (2000 UT) tablet Take 2,000 Units by mouth daily.     pramipexole (MIRAPEX) 0.75 MG tablet Take 1  tablet (0.75 mg total) by mouth 2 (two) times daily. 180 tablet 3   No current facility-administered medications for this visit.    Medication Side Effects: None  Allergies: No Known Allergies  Past Medical History:  Diagnosis Date   Anemia    Anxiety    Dr.Yurani Fettes . well controlled   Arthritis    back, hands, knees   Colon polyp 05/09/2016   TUBULAR ADENOMAS    Depression    Diabetes mellitus without complication ([place])    type 2   Ectatic abdominal aorta ([place]) 04/18/2016   2.9 cm on [place] 2016, unchanged 2017   History of hiatal hernia    20 years ago   History of skin cancer    hand   Hyperlipidemia    Hypertension 2015   Dr. Sanda Klein   Morbid obesity ([place]) 11/11/2014   Neuropathy    Obesity 11/11/2014   Sleep apnea    cpap   Squamous cell carcinoma of skin 04/11/2018   SCC In Situ R dorsum wrist   Squamous cell carcinoma of skin 06/23/2015   R dorsum hand    Past Medical History, Surgical history, Social history, and Family history were reviewed and updated as appropriate.   Please see review of systems for further details on the patient's review from today.   Objective:   Physical Exam:  Wt 258 lb (117 kg)   BMI 39.23 kg/m   Physical Exam Constitutional:      General: He is not in acute distress. Musculoskeletal:        General: No deformity.  Neurological:     Mental Status: He is alert and oriented to person, place, and time.     Coordination: Coordination normal.  Psychiatric:        Attention and Perception: Attention and perception normal. He does not perceive auditory or visual hallucinations.        Mood and Affect: Mood normal. Mood is not anxious or depressed. Affect is not labile, blunt, angry or inappropriate.        Speech: Speech normal.        Behavior: Behavior normal.        Thought Content: Thought content normal. Thought content is not paranoid or delusional. Thought content does not include homicidal or suicidal ideation. Thought  content does not include homicidal or suicidal plan.        Cognition and Memory: Cognition and memory normal.        Judgment: Judgment normal.     Comments: Insight intact     Lab Review:     Component Value Date/Time   NA 140 06/07/2020 0812   NA 139 05/12/2015 1537   K 4.3 06/07/2020 0812   CL 108 06/07/2020 0812   CO2 23 06/07/2020 0812   GLUCOSE 105 (H) 06/07/2020 [id number]  BUN 15 06/07/2020 0812   BUN 15 05/12/2015 1537   CREATININE 0.90 06/07/2020 0812   CALCIUM 8.4 (L) 06/07/2020 0812   PROT 5.9 (L) 06/07/2020 0812   PROT 6.2 03/19/2015 0825   ALBUMIN 4.1 09/19/2016 1400   ALBUMIN 4.0 03/19/2015 0825   AST 26 06/07/2020 0812   ALT 30 06/07/2020 0812   ALKPHOS 46 09/19/2016 1400   BILITOT 0.2 06/07/2020 0812   BILITOT 0.5 03/19/2015 0825   GFRNONAA 86 06/07/2020 0812   GFRAA 100 06/07/2020 0812       Component Value Date/Time   WBC 5.2 06/07/2020 0812   RBC 4.63 06/07/2020 0812   HGB 14.5 06/07/2020 0812   HGB 14.8 06/20/2016 1458   HCT 42.6 06/07/2020 0812   HCT 43.6 06/20/2016 1458   PLT 250 06/07/2020 0812   PLT 260 06/20/2016 1458   MCV 92.0 06/07/2020 0812   MCV 93 06/20/2016 1458   MCH 31.3 06/07/2020 0812   MCHC 34.0 06/07/2020 0812   RDW 13.6 06/07/2020 0812   RDW 13.3 06/20/2016 1458   LYMPHSABS 1,404 06/07/2020 0812   LYMPHSABS 2.0 06/20/2016 1458   MONOABS 0.4 03/29/2017 1343   EOSABS 312 06/07/2020 0812   EOSABS 0.4 06/20/2016 1458   BASOSABS 52 06/07/2020 0812   BASOSABS 0.1 06/20/2016 1458    No results found for: "POCLITH", "LITHIUM"   No results found for: "PHENYTOIN", "PHENOBARB", "VALPROATE", "CBMZ"   .res Assessment: Plan:    Pt seen for follow-up visit. He reports that [place] was helpful for treatment of depression and that he currently is not experiencing any depressive s/s. He reports that he is continuing to take Pramipexole 0.75 mg po BID since this has been helpful for RLS and he is requesting a refill of Pramipexole. Will  continue Pramipexole 0.75 mg po BID.  Discussed that PCP may be able to assume management of Pramipexole and that he may follow-up in this office as needed.  Patient advised to contact office with any questions, adverse effects, or acute worsening in signs and symptoms.  Kevis was seen today for follow-up.  Diagnoses and all orders for this visit:  Depression, major, in remission ([place]) -     pramipexole (MIRAPEX) 0.75 MG tablet; Take 1 tablet (0.75 mg total) by mouth 2 (two) times daily.     Please see After Visit Summary for patient specific instructions.  Future Appointments  Date Time Provider [place]  09/20/2022  3:45 PM Ralene Bathe, MD [facility] None  10/05/2022 10:45 AM Felipa Furnace, DPM [facility]  10/26/2022  7:30 AM [facility] V[facility] 1 [facility] None  10/26/2022  8:30 AM Schnier, Dolores Lory, MD [facility] None  03/12/2023  3:00 PM [facility] None  03/15/2023  3:00 PM McGowan, Larene Beach A, PA-C [facility] None    No orders of the defined types were placed in this encounter.   -------------------------------

## 2022-09-20 ENCOUNTER — Encounter: Payer: Self-pay | Admitting: Dermatology

## 2022-09-20 ENCOUNTER — Ambulatory Visit: Payer: Medicare HMO | Admitting: Dermatology

## 2022-09-20 VITALS — BP 130/68 | HR 80

## 2022-09-20 DIAGNOSIS — Z5111 Encounter for antineoplastic chemotherapy: Secondary | ICD-10-CM

## 2022-09-20 DIAGNOSIS — X32XXXA Exposure to sunlight, initial encounter: Secondary | ICD-10-CM

## 2022-09-20 DIAGNOSIS — L570 Actinic keratosis: Secondary | ICD-10-CM | POA: Diagnosis not present

## 2022-09-20 DIAGNOSIS — L578 Other skin changes due to chronic exposure to nonionizing radiation: Secondary | ICD-10-CM | POA: Diagnosis not present

## 2022-09-20 DIAGNOSIS — L820 Inflamed seborrheic keratosis: Secondary | ICD-10-CM

## 2022-09-20 DIAGNOSIS — Z79899 Other long term (current) drug therapy: Secondary | ICD-10-CM

## 2022-09-20 DIAGNOSIS — W908XXA Exposure to other nonionizing radiation, initial encounter: Secondary | ICD-10-CM

## 2022-09-20 DIAGNOSIS — Z7189 Other specified counseling: Secondary | ICD-10-CM

## 2022-09-20 NOTE — Progress Notes (Signed)
Follow-Up Visit   Subjective  Duane Hughes is a 72 y.o. male who presents for the following: 5 month AK recheck. Face, arms. Tx with LN2 at last visit. Used 5FU/Calcipotriene treatment on arms since last visit. Reports significant reaction so has not started on face yet.   The patient has spots, moles and lesions to be evaluated, some may be new or changing and the patient has concerns that these could be cancer.    The following portions of the chart were reviewed this encounter and updated as appropriate: medications, allergies, medical history  Review of Systems:  No other skin or systemic complaints except as noted in HPI or Assessment and Plan.  Objective  Well appearing patient in no apparent distress; mood and affect are within normal limits.  A focused examination was performed of the following areas: Face, arms, ears  Relevant physical exam findings are noted in the Assessment and Plan.  face, hands, arms x24 (24) Erythematous thin papules/macules with gritty scale.   Right Inferior Cheek x5 (5) Erythematous keratotic or waxy stuck-on papule or plaque.    Assessment & Plan   AK (actinic keratosis) (24) face, hands, arms x24  Actinic keratoses are precancerous spots that appear secondary to cumulative UV radiation exposure/sun exposure over time. They are chronic with expected duration over 1 year. A portion of actinic keratoses will progress to squamous cell carcinoma of the skin. It is not possible to reliably predict which spots will progress to skin cancer and so treatment is recommended to prevent development of skin cancer.  Recommend daily broad spectrum sunscreen SPF 30+ to sun-exposed areas, reapply every 2 hours as needed.  Recommend staying in the shade or wearing long sleeves, sun glasses (UVA+UVB protection) and wide brim hats (4-inch brim around the entire circumference of the hat). Call for new or changing lesions.  Destruction of lesion - face,  hands, arms x24 Complexity: simple   Destruction method: cryotherapy   Informed consent: discussed and consent obtained   Timeout:  patient name, date of birth, surgical site, and procedure verified Lesion destroyed using liquid nitrogen: Yes   Region frozen until ice ball extended beyond lesion: Yes   Outcome: patient tolerated procedure well with no complications   Post-procedure details: wound care instructions given   Additional details:  Prior to procedure, discussed risks of blister formation, small wound, skin dyspigmentation, or rare scar following cryotherapy. Recommend Vaseline ointment to treated areas while healing.   Inflamed seborrheic keratosis (5) Right Inferior Cheek x5  Symptomatic, irritating, patient would like treated.  Destruction of lesion - Right Inferior Cheek x5 Complexity: simple   Destruction method: cryotherapy   Informed consent: discussed and consent obtained   Timeout:  patient name, date of birth, surgical site, and procedure verified Lesion destroyed using liquid nitrogen: Yes   Region frozen until ice ball extended beyond lesion: Yes   Outcome: patient tolerated procedure well with no complications   Post-procedure details: wound care instructions given   Additional details:  Prior to procedure, discussed risks of blister formation, small wound, skin dyspigmentation, or rare scar following cryotherapy. Recommend Vaseline ointment to treated areas while healing.   ACTINIC DAMAGE WITH PRECANCEROUS ACTINIC KERATOSES Counseling for Topical Chemotherapy Management: Patient exhibits: - Severe, confluent actinic changes with pre-cancerous actinic keratoses that is secondary to cumulative UV radiation exposure over time - Condition that is severe; chronic, not at goal. - diffuse scaly erythematous macules and papules with underlying dyspigmentation - Discussed Prescription "[employer]  Treatment" topical Chemotherapy for Severe, Chronic Confluent Actinic Changes  with Pre-Cancerous Actinic Keratoses Field treatment involves treatment of an entire area of skin that has confluent Actinic Changes (Sun/ Ultraviolet light damage) and PreCancerous Actinic Keratoses by method of PhotoDynamic Therapy (PDT) and/or prescription Topical Chemotherapy agents such as 5-fluorouracil, 5-fluorouracil/calcipotriene, and/or imiquimod.  The purpose is to decrease the number of clinically evident and subclinical PreCancerous lesions to prevent progression to development of skin cancer by chemically destroying early precancer changes that may or may not be visible.  It has been shown to reduce the risk of developing skin cancer in the treated area. As a result of treatment, redness, scaling, crusting, and open sores may occur during treatment course. One or more than one of these methods may be used and may have to be used several times to control, suppress and eliminate the PreCancerous changes. Discussed treatment course, expected reaction, and possible side effects. - Recommend daily broad spectrum sunscreen SPF 30+ to sun-exposed areas, reapply every 2 hours as needed.  - Staying in the shade or wearing long sleeves, sun glasses (UVA+UVB protection) and wide brim hats (4-inch brim around the entire circumference of the hat) are also recommended. - Call for new or changing lesions.  - Start 5-fluorouracil/calcipotriene cream twice a day for 7 days to affected areas including temples. Prescription sent to [facility]. Patient advised they will receive an email to purchase the medication online and have it sent to their home. Patient provided with handout reviewing treatment course and side effects and advised to call or message [place] on MyChart with any concerns.  Reviewed course of treatment and expected reaction.  Patient advised to expect inflammation and crusting and advised that erosions are possible.  Patient advised to be diligent with sun protection during  and after treatment. Counseled to keep medication out of reach of children and pets.     Return for AK Follow Up 6-8 months.  I, Lawson Radar, CMA, am acting as scribe for Armida Sans, MD.   Documentation: I have reviewed the above documentation for accuracy and completeness, and I agree with the above.  Armida Sans, MD

## 2022-09-20 NOTE — Patient Instructions (Addendum)
Start 5-fluorouracil/calcipotriene cream twice a day for 7 days to affected areas including temples.  Reviewed course of treatment and expected reaction.  Patient advised to expect inflammation and crusting and advised that erosions are possible.  Patient advised to be diligent with sun protection during and after treatment. Counseled to keep medication out of reach of children and pets.    Cryotherapy Aftercare  Wash gently with soap and water everyday.   Apply Vaseline daily until healed.   Recommend daily broad spectrum sunscreen SPF 30+ to sun-exposed areas, reapply every 2 hours as needed. Call for new or changing lesions.  Staying in the shade or wearing long sleeves, sun glasses (UVA+UVB protection) and wide brim hats (4-inch brim around the entire circumference of the hat) are also recommended for sun protection.    Due to recent changes in healthcare laws, you may see results of your pathology and/or laboratory studies on MyChart before the doctors have had a chance to review them. We understand that in some cases there may be results that are confusing or concerning to you. Please understand that not all results are received at the same time and often the doctors may need to interpret multiple results in order to provide you with the best plan of care or course of treatment. Therefore, we ask that you please give [place] 2 business days to thoroughly review all your results before contacting the office for clarification. Should we see a critical lab result, you will be contacted sooner.   If You Need Anything After Your Visit  If you have any questions or concerns for your doctor, please call our main line at [phone] and press option 4 to reach your doctor's medical assistant. If no one answers, please leave a voicemail as directed and we will return your call as soon as possible. Messages left after 4 pm will be answered the following business day.   You may also send [place] a message via  MyChart. We typically respond to MyChart messages within 1-2 business days.  For prescription refills, please ask your pharmacy to contact our office. Our fax number is [phone].  If you have an urgent issue when the clinic is closed that cannot wait until the next business day, you can page your doctor at the number below.    Please note that while we do our best to be available for urgent issues outside of office hours, we are not available 24/7.   If you have an urgent issue and are unable to reach [place], you may choose to seek medical care at your doctor's office, retail clinic, urgent care center, or emergency room.  If you have a medical emergency, please immediately call 911 or go to the emergency department.  Pager Numbers  - Dr. Gwen Pounds: [phone]  - Dr. Neale Burly: [phone]  - Dr. Roseanne Reno: [phone]  In the event of inclement weather, please call our main line at [phone] for an update on the status of any delays or closures.  Dermatology Medication Tips: Please keep the boxes that topical medications come in in order to help keep track of the instructions about where and how to use these. Pharmacies typically print the medication instructions only on the boxes and not directly on the medication tubes.   If your medication is too expensive, please contact our office at [phone] option 4 or send [place] a message through MyChart.   We are unable to tell what your co-pay for medications will be in advance as this  is different depending on your insurance coverage. However, we may be able to find a substitute medication at lower cost or fill out paperwork to get insurance to cover a needed medication.   If a prior authorization is required to get your medication covered by your insurance company, please allow [place] 1-2 business days to complete this process.  Drug prices often vary depending on where the prescription is filled and some pharmacies may offer cheaper  prices.  The website www.goodrx.com contains coupons for medications through different pharmacies. The prices here do not account for what the cost may be with help from insurance (it may be cheaper with your insurance), but the website can give you the price if you did not use any insurance.  - You can print the associated coupon and take it with your prescription to the pharmacy.  - You may also stop by our office during regular business hours and pick up a GoodRx coupon card.  - If you need your prescription sent electronically to a different pharmacy, notify our office through [facility] or by phone at [phone] option 4.     Si Usted Necesita Algo Despus de Su Visita  Tambin puede enviarnos un mensaje a travs de Clinical cytogeneticist. Por lo general respondemos a los mensajes de MyChart en el transcurso de 1 a 2 das hbiles.  Para renovar recetas, por favor pida a su farmacia que se ponga en contacto con nuestra oficina. Annie Sable de fax es [place] [phone].  Si tiene un asunto urgente cuando la clnica est cerrada y que no puede esperar hasta el siguiente da hbil, puede llamar/localizar a su doctor(a) al nmero que aparece a continuacin.   Por favor, tenga en cuenta que aunque hacemos todo lo posible para estar disponibles para asuntos urgentes fuera del horario de [place], no estamos disponibles las 24 horas del da, los 7 [address]  [address] de la [place].   Si tiene un problema urgente y no puede comunicarse con nosotros, puede optar por buscar atencin mdica  en el consultorio de su doctor(a), en una clnica privada, en un centro de atencin urgente o en una sala de emergencias.  Si tiene Engineer, drilling, por favor llame inmediatamente al 911 o vaya a la sala de emergencias.  Nmeros de bper  - Dr. Gwen Pounds: [phone]  - Dra. Moye: [phone]  - Dra. Roseanne Reno: [phone]  En caso de inclemencias del [place], por favor llame a Lacy Duverney principal al [phone]  para una actualizacin sobre el [place] de cualquier retraso o cierre.  Consejos para la medicacin en dermatologa: Por favor, guarde las cajas en las que vienen los medicamentos de uso tpico para ayudarle a seguir las instrucciones sobre dnde y cmo usarlos. Las farmacias generalmente imprimen las instrucciones del medicamento slo en las cajas y no directamente en los tubos del [place].   Si su medicamento es muy caro, por favor, pngase en contacto con Rolm Gala llamando al [phone] y presione la opcin 4 o envenos un mensaje a travs de Clinical cytogeneticist.   No podemos decirle cul ser su copago por los medicamentos por adelantado ya que esto es diferente dependiendo de la cobertura de su seguro. Sin embargo, es posible que podamos encontrar un medicamento sustituto a Audiological scientist un formulario para que el seguro cubra el medicamento que se considera necesario.   Si se requiere una autorizacin previa para que su compaa de seguros [place] su medicamento, por favor permtanos de 1 a 2 das hbiles para  completar [employer].  Los precios de los medicamentos varan con frecuencia dependiendo del Environmental consultant de dnde se surte la receta y alguna farmacias pueden ofrecer precios ms baratos.  El sitio web www.goodrx.com tiene cupones para medicamentos de Health and safety inspector. Los precios aqu no tienen en cuenta lo que podra costar con la ayuda del seguro (puede ser ms barato con su seguro), pero el sitio web puede darle el precio si no utiliz Tourist information centre manager.  - Puede imprimir el cupn correspondiente y llevarlo con su receta a la farmacia.  - Tambin puede pasar por nuestra oficina durante el horario de atencin regular y Education officer, museum una tarjeta de cupones de GoodRx.  - Si necesita que su receta se enve electrnicamente a una farmacia diferente, informe a nuestra oficina a travs de MyChart de [place] o por telfono llamando al [phone] y presione la opcin 4.

## 2022-09-26 ENCOUNTER — Other Ambulatory Visit: Payer: Self-pay | Admitting: Psychiatry

## 2022-09-26 DIAGNOSIS — F325 Major depressive disorder, single episode, in full remission: Secondary | ICD-10-CM

## 2022-10-03 ENCOUNTER — Ambulatory Visit: Payer: Medicare HMO | Admitting: Podiatry

## 2022-10-03 ENCOUNTER — Encounter: Payer: Self-pay | Admitting: Dermatology

## 2022-10-03 DIAGNOSIS — M79675 Pain in left toe(s): Secondary | ICD-10-CM

## 2022-10-03 DIAGNOSIS — B351 Tinea unguium: Secondary | ICD-10-CM | POA: Diagnosis not present

## 2022-10-03 DIAGNOSIS — M79674 Pain in right toe(s): Secondary | ICD-10-CM | POA: Diagnosis not present

## 2022-10-03 NOTE — Progress Notes (Signed)
This patient returns to my office for at risk foot care.  This patient requires this care by a professional since this patient will be at risk due to having diabetes.  This patient is unable to cut nails himself since the patient cannot reach his nails.These nails are painful walking and wearing shoes.  This patient presents for at risk foot care today.  Patient presents with secondary complaint today of dry skin.  He would like to discuss treatment options he does not moisturize his foot.  He denies any other acute complaints.  General Appearance  Alert, conversant and in no acute stress.  Vascular  Dorsalis pedis and posterior tibial  pulses are palpable  bilaterally.  Capillary return is within normal limits  bilaterally. Temperature is within normal limits  bilaterally.  Neurologic  Senn-Weinstein monofilament wire test within normal limits  bilaterally. Muscle power within normal limits bilaterally.  Nails Thick disfigured discolored nails with subungual debris  from hallux to fifth toes bilaterally. No evidence of bacterial infection or drainage bilaterally.  Orthopedic  No limitations of motion  feet .  No crepitus or effusions noted.  No bony pathology or digital deformities noted.  Skin  normotropic skin with no porokeratosis noted bilaterally.  No signs of infections or ulcers noted.   Dry skin noted to both lower extremity.  Onychomycosis  Pain in right toes  Pain in left toes  Consent was obtained for treatment procedures.   Mechanical debridement of nails 1-5  bilaterally performed with a nail nipper.  Filed with dremel without incident.    Return office visit   10 weeks                   Told patient to return for periodic foot care and evaluation due to potential at risk complications.  Xerosis skin -Clinically improving    Ralpheal Zappone D.P.M. 

## 2022-10-05 ENCOUNTER — Ambulatory Visit: Payer: Medicare HMO | Admitting: Podiatry

## 2022-10-23 ENCOUNTER — Other Ambulatory Visit (INDEPENDENT_AMBULATORY_CARE_PROVIDER_SITE_OTHER): Payer: Self-pay | Admitting: Vascular Surgery

## 2022-10-23 DIAGNOSIS — I714 Abdominal aortic aneurysm, without rupture, unspecified: Secondary | ICD-10-CM

## 2022-10-25 NOTE — Progress Notes (Signed)
MRN : [id number]  Duane Hughes is a 72 y.o. (01-18-1951) male who presents with chief complaint of check circulation.  History of Present Illness:   The patient returns to the office for surveillance of a known abdominal aortic aneurysm. Patient denies abdominal pain or back pain, no other abdominal complaints. No changes suggesting embolic episodes.   There have been no interval changes in the patient's overall health care since his last visit.  She continues to have numbness and painful tingling in her feet which is consistent with neuropathy.  Patient denies amaurosis fugax or TIA symptoms. There is no history of claudication or rest pain symptoms of the lower extremities. The patient denies angina or shortness of breath.   Duplex ultrasound shows an AAA that measures 2.81 cm, the previous measurement was 2.80 cm.  No significant change compared to last year.  No outpatient medications have been marked as taking for the 10/26/22 encounter (Appointment) with Gilda Crease, Latina Craver, MD.    Past Medical History:  Diagnosis Date   Anemia    Anxiety    Dr.Jessica Montez Morita . well controlled   Arthritis    back, hands, knees   Colon polyp 05/09/2016   TUBULAR ADENOMAS    Depression    Diabetes mellitus without complication (HCC)    type 2   Ectatic abdominal aorta (HCC) 04/18/2016   2.9 cm on [place] 2016, unchanged 2017   History of hiatal hernia    20 years ago   History of skin cancer    hand   Hyperlipidemia    Hypertension 2015   Dr. Sherie Don   Morbid obesity (HCC) 11/11/2014   Neuropathy    Obesity 11/11/2014   Sleep apnea    cpap   Squamous cell carcinoma of skin 04/11/2018   SCC In Situ R dorsum wrist   Squamous cell carcinoma of skin 06/23/2015   R dorsum hand    Past Surgical History:  Procedure Laterality Date   COLONOSCOPY WITH PROPOFOL N/A 05/09/2016   Procedure: COLONOSCOPY WITH PROPOFOL;  Surgeon:  Midge Minium, MD;  Location: [facility];  Service: Endoscopy;  Laterality: N/A;   HERNIA REPAIR     Hiatal   JOINT REPLACEMENT     04-04-17 Dr. Darrelyn Hillock   Right knee   KNEE ARTHROSCOPY     several scopes done   KNEE CLOSED REDUCTION Right 05/31/2017   Procedure: CLOSED MANIPULATION RIGHT KNEE;  Surgeon: Ranee Gosselin, MD;  Location: [facility];  Service: Orthopedics;  Laterality: Right;  Femoral Block   LUMBAR LAMINECTOMY/DECOMPRESSION MICRODISCECTOMY N/A 01/26/2020   Procedure: L3, L4 LAMINECTOMIES;  Surgeon: Lucy Chris, MD;  Location: [facility];  Service: Neurosurgery;  Laterality: N/A;   MEDIAL PARTIAL KNEE REPLACEMENT Left    2000s?   SKIN CANCER EXCISION  Feb 2017   removed from right hand   TOTAL KNEE ARTHROPLASTY Right 04/04/2017   Procedure: RIGHT TOTAL KNEE ARTHROPLASTY;  Surgeon: Ranee Gosselin, MD;  Location: [facility];  Service: Orthopedics;  Laterality: Right;   TOTAL KNEE ARTHROPLASTY Left 09/24/2018   Procedure: REVISION LEFT KNEE ARTHROPLASTY;  Surgeon:  Durene Romans, MD;  Location: [facility];  Service: Orthopedics;  Laterality: Left;  70 mins    Social History Social History   Tobacco Use   Smoking status: Never   Smokeless tobacco: Never  Vaping Use   Vaping Use: Never used  Substance Use Topics   Alcohol use: No    Alcohol/week: 0.0 standard drinks of alcohol   Drug use: No    Family History Family History  Problem Relation Age of Onset   Alcohol abuse Mother    Rheum arthritis Mother    Stroke Maternal Grandmother    Stroke Paternal Grandfather    Dementia Paternal Grandmother    Cancer Neg Hx    Diabetes Neg Hx    Heart disease Neg Hx    Hypertension Neg Hx    Kidney disease Neg Hx    Prostate cancer Neg Hx     No Known Allergies   REVIEW OF SYSTEMS (Negative unless checked)  Constitutional: [] Weight loss  [] Fever  [] Chills Cardiac: [] Chest pain   [] Chest pressure   [] Palpitations   [] Shortness of breath when laying flat   [] Shortness of breath  with exertion. Vascular:  [x] Pain in legs with walking   [] Pain in legs at rest  [] History of DVT   [] Phlebitis   [] Swelling in legs   [] Varicose veins   [] Non-healing ulcers Pulmonary:   [] Uses home oxygen   [] Productive cough   [] Hemoptysis   [] Wheeze  [] COPD   [] Asthma Neurologic:  [] Dizziness   [] Seizures   [] History of stroke   [] History of TIA  [] Aphasia   [] Vissual changes   [] Weakness or numbness in arm   [] Weakness or numbness in leg Musculoskeletal:   [] Joint swelling   [] Joint pain   [] Low back pain Hematologic:  [] Easy bruising  [] Easy bleeding   [] Hypercoagulable state   [] Anemic Gastrointestinal:  [] Diarrhea   [] Vomiting  [] Gastroesophageal reflux/heartburn   [] Difficulty swallowing. Genitourinary:  [] Chronic kidney disease   [] Difficult urination  [] Frequent urination   [] Blood in urine Skin:  [] Rashes   [] Ulcers  Psychological:  [] History of anxiety   []  History of major depression.  Physical Examination  There were no vitals filed for this visit. There is no height or weight on file to calculate BMI. Gen: WD/WN, NAD Head: [place]/AT, No temporalis wasting.  Ear/Nose/Throat: Hearing grossly intact, nares w/o erythema or drainage Eyes: PER, EOMI, sclera nonicteric.  Neck: Supple, no masses.  No bruit or JVD.  Pulmonary:  Good air movement, no audible wheezing, no use of accessory muscles.  Cardiac: RRR, normal S1, S2, no Murmurs. Vascular:  mild trophic changes, no open wounds Vessel Right Left  Radial Palpable Palpable  PT Not Palpable Not Palpable  DP Not Palpable Not Palpable  Gastrointestinal: soft, non-distended. No guarding/no peritoneal signs.  Musculoskeletal: M/S 5/5 throughout.  No visible deformity.  Neurologic: CN 2-12 intact. Pain and light touch intact in extremities.  Symmetrical.  Speech is fluent. Motor exam as listed above. Psychiatric: Judgment intact, Mood & affect appropriate for pt's clinical situation. Dermatologic: No rashes or ulcers noted.  No  changes consistent with cellulitis.   CBC Lab Results  Component Value Date   WBC 5.2 06/07/2020   HGB 14.5 06/07/2020   HCT 42.6 06/07/2020   MCV 92.0 06/07/2020   PLT 250 06/07/2020    BMET    Component Value Date/Time   NA 140 06/07/2020 0812   NA 139 05/12/2015 1537   K 4.3 06/07/2020 1610  CL 108 06/07/2020 0812   CO2 23 06/07/2020 0812   GLUCOSE 105 (H) 06/07/2020 0812   BUN 15 06/07/2020 0812   BUN 15 05/12/2015 1537   CREATININE 0.90 06/07/2020 0812   CALCIUM 8.4 (L) 06/07/2020 0812   GFRNONAA 86 06/07/2020 0812   GFRAA 100 06/07/2020 0812   CrCl cannot be calculated (Patient's most recent lab result is older than the maximum 21 days allowed.).  COAG Lab Results  Component Value Date   INR 1.0 01/22/2020   INR 0.98 03/29/2017    Radiology No results found.   Assessment/Plan 1. Infrarenal abdominal aortic aneurysm (AAA) without rupture (HCC) Recommend: No surgery or intervention is indicated at this time.  The patient has an asymptomatic abdominal aortic aneurysm that is less than 4 cm in maximal diameter.    I have reviewed the natural history of abdominal aortic aneurysm and the small risk of rupture for aneurysm less than 5 cm in size.  However, as these small aneurysms tend to enlarge over time, continued surveillance with ultrasound or CT scan is mandatory.   I have also discussed optimizing medical management with hypertension and lipid control and the importance of abstinence from tobacco.  The patient is also encouraged to exercise a minimum of 30 minutes 4 times a week.   Should the patient develop new onset abdominal or back pain or signs of peripheral embolization they are instructed to seek medical attention immediately and to alert the physician providing care that they have an aneurysm.   The patient voices their understanding.  The patient will return in 12 months with an aortic duplex. - VAS US AORTA/IVC/ILIACS; Future  2.  Lymphedema Recommend:  No surgery or intervention at this point in time.    I have reviewed my discussion with the patient regarding venous insufficiency and secondary lymph edema and why it  causes symptoms. I have discussed with the patient the chronic skin changes that accompany these problems and the long term sequela such as ulceration and infection.  Patient will continue wearing graduated compression on a daily basis a prescription, if needed, was given to the patient to keep this updated. The patient will  put the compression on first thing in the morning and removing them in the evening. The patient is instructed specifically not to sleep in the compression.  In addition, behavioral modification including elevation during the day will be continued.  Diet and salt restriction will also be helpful.  Previous duplex ultrasound of the lower extremities shows normal deep venous system, superficial reflux was not present.   Following the review of the ultrasound the patient will follow up in 12 months to reassess the degree of swelling and the control that graduated compression is offering.   The patient can be assessed for a Lymph Pump at that time.  However, at this time the patient states they are satisfied with the control compression and elevation is yielding.    3. Benign hypertension Continue antihypertensive medications as already ordered, these medications have been reviewed and there are no changes at this time.  4. Diabetic polyneuropathy associated with type 2 diabetes mellitus (HCC) Continue hypoglycemic medications as already ordered, these medications have been reviewed and there are no changes at this time.  Hgb A1C to be monitored as already arranged by primary service  5. Dyslipidemia Continue statin as ordered and reviewed, no changes at this time    Levora Dredge, MD  10/25/2022 7:55 AM

## 2022-10-26 ENCOUNTER — Ambulatory Visit (INDEPENDENT_AMBULATORY_CARE_PROVIDER_SITE_OTHER): Payer: Medicare HMO | Admitting: Vascular Surgery

## 2022-10-26 ENCOUNTER — Encounter (INDEPENDENT_AMBULATORY_CARE_PROVIDER_SITE_OTHER): Payer: Self-pay | Admitting: Vascular Surgery

## 2022-10-26 ENCOUNTER — Ambulatory Visit (INDEPENDENT_AMBULATORY_CARE_PROVIDER_SITE_OTHER): Payer: Medicare HMO

## 2022-10-26 VITALS — BP 113/69 | HR 59 | Resp 18 | Ht 68.0 in | Wt 257.8 lb

## 2022-10-26 DIAGNOSIS — I10 Essential (primary) hypertension: Secondary | ICD-10-CM

## 2022-10-26 DIAGNOSIS — I714 Abdominal aortic aneurysm, without rupture, unspecified: Secondary | ICD-10-CM

## 2022-10-26 DIAGNOSIS — I890 Lymphedema, not elsewhere classified: Secondary | ICD-10-CM

## 2022-10-26 DIAGNOSIS — E785 Hyperlipidemia, unspecified: Secondary | ICD-10-CM

## 2022-10-26 DIAGNOSIS — E1142 Type 2 diabetes mellitus with diabetic polyneuropathy: Secondary | ICD-10-CM

## 2022-10-26 DIAGNOSIS — I7143 Infrarenal abdominal aortic aneurysm, without rupture: Secondary | ICD-10-CM | POA: Diagnosis not present

## 2022-10-29 ENCOUNTER — Encounter (INDEPENDENT_AMBULATORY_CARE_PROVIDER_SITE_OTHER): Payer: Self-pay | Admitting: Vascular Surgery

## 2022-12-18 ENCOUNTER — Other Ambulatory Visit: Payer: Self-pay | Admitting: Psychiatry

## 2022-12-18 DIAGNOSIS — F325 Major depressive disorder, single episode, in full remission: Secondary | ICD-10-CM

## 2023-01-02 ENCOUNTER — Ambulatory Visit: Payer: Medicare HMO | Admitting: Podiatry

## 2023-01-02 DIAGNOSIS — M79674 Pain in right toe(s): Secondary | ICD-10-CM

## 2023-01-02 DIAGNOSIS — M79675 Pain in left toe(s): Secondary | ICD-10-CM

## 2023-01-02 DIAGNOSIS — B351 Tinea unguium: Secondary | ICD-10-CM | POA: Diagnosis not present

## 2023-01-02 NOTE — Progress Notes (Signed)
This patient returns to my office for at risk foot care.  This patient requires this care by a professional since this patient will be at risk due to having diabetes.  This patient is unable to cut nails himself since the patient cannot reach his nails.These nails are painful walking and wearing shoes.  This patient presents for at risk foot care today.  Patient presents with secondary complaint today of dry skin.  He would like to discuss treatment options he does not moisturize his foot.  He denies any other acute complaints.  General Appearance  Alert, conversant and in no acute stress.  Vascular  Dorsalis pedis and posterior tibial  pulses are palpable  bilaterally.  Capillary return is within normal limits  bilaterally. Temperature is within normal limits  bilaterally.  Neurologic  Senn-Weinstein monofilament wire test within normal limits  bilaterally. Muscle power within normal limits bilaterally.  Nails Thick disfigured discolored nails with subungual debris  from hallux to fifth toes bilaterally. No evidence of bacterial infection or drainage bilaterally.  Orthopedic  No limitations of motion  feet .  No crepitus or effusions noted.  No bony pathology or digital deformities noted.  Skin  normotropic skin with no porokeratosis noted bilaterally.  No signs of infections or ulcers noted.   Dry skin noted to both lower extremity.  Onychomycosis  Pain in right toes  Pain in left toes  Consent was obtained for treatment procedures.   Mechanical debridement of nails 1-5  bilaterally performed with a nail nipper.  Filed with dremel without incident.    Return office visit   10 weeks                   Told patient to return for periodic foot care and evaluation due to potential at risk complications.  Xerosis skin -Clinically improving    Kevin Patel D.P.M. 

## 2023-03-12 ENCOUNTER — Other Ambulatory Visit: Payer: Medicare HMO

## 2023-03-12 ENCOUNTER — Other Ambulatory Visit: Payer: Self-pay

## 2023-03-12 DIAGNOSIS — N138 Other obstructive and reflux uropathy: Secondary | ICD-10-CM

## 2023-03-13 ENCOUNTER — Other Ambulatory Visit: Payer: Medicare HMO

## 2023-03-13 ENCOUNTER — Other Ambulatory Visit: Payer: Self-pay | Admitting: Psychiatry

## 2023-03-13 DIAGNOSIS — F325 Major depressive disorder, single episode, in full remission: Secondary | ICD-10-CM

## 2023-03-13 DIAGNOSIS — N138 Other obstructive and reflux uropathy: Secondary | ICD-10-CM

## 2023-03-13 NOTE — Progress Notes (Unsigned)
9:02 PM   Duane Hughes 11-22-1950 [id number]  Referring provider: Thereasa Distance, MD [address] [place],  [place] [address]  Urological history: 1. ED -contributing factors of age, BPH, DM, HTN, HLD, sleep apnea, depression, antidepressants, pain medication and blood pressure medications -managed with sildenafil 20 mg, on-demand-dosing  2. BPH with LU TS -PSA (03/2023) ***  HPI: Duane Hughes is  a 72 y.o. male who presents for a yearly visit.    Previous records reviewed.   SHIM ***      Score: 1-7 Severe ED 8-11 Moderate ED 12-16 Mild-Moderate ED 17-21 Mild ED 22-25 No ED  I PSS ***      Score:  1-7 Mild 8-19 Moderate 20-35 Severe   PMH: Past Medical History:  Diagnosis Date   Anemia    Anxiety    Dr.Jessica Carter . well controlled   Arthritis    back, hands, knees   Colon polyp 05/09/2016   TUBULAR ADENOMAS    Depression    Diabetes mellitus without complication (HCC)    type 2   Ectatic abdominal aorta (HCC) 04/18/2016   2.9 cm on [place] 2016, unchanged 2017   History of hiatal hernia    20 years ago   History of skin cancer    hand   Hyperlipidemia    Hypertension 2015   Dr. Sherie Don   Morbid obesity (HCC) 11/11/2014   Neuropathy    Obesity 11/11/2014   Sleep apnea    cpap   Squamous cell carcinoma of skin 04/11/2018   SCC In Situ R dorsum wrist   Squamous cell carcinoma of skin 06/23/2015   R dorsum hand    Surgical History: Past Surgical History:  Procedure Laterality Date   COLONOSCOPY WITH PROPOFOL N/A 05/09/2016   Procedure: COLONOSCOPY WITH PROPOFOL;  Surgeon: Midge Minium, MD;  Location: [facility];  Service: Endoscopy;  Laterality: N/A;   HERNIA REPAIR     Hiatal   JOINT REPLACEMENT     04-04-17 Dr. Darrelyn Hillock   Right knee   KNEE ARTHROSCOPY     several scopes done   KNEE CLOSED REDUCTION Right 05/31/2017   Procedure: CLOSED MANIPULATION RIGHT KNEE;  Surgeon: Ranee Gosselin, MD;  Location: [facility];  Service:  Orthopedics;  Laterality: Right;  Femoral Block   LUMBAR LAMINECTOMY/DECOMPRESSION MICRODISCECTOMY N/A 01/26/2020   Procedure: L3, L4 LAMINECTOMIES;  Surgeon: Lucy Chris, MD;  Location: [facility];  Service: Neurosurgery;  Laterality: N/A;   MEDIAL PARTIAL KNEE REPLACEMENT Left    2000s?   SKIN CANCER EXCISION  Feb 2017   removed from right hand   TOTAL KNEE ARTHROPLASTY Right 04/04/2017   Procedure: RIGHT TOTAL KNEE ARTHROPLASTY;  Surgeon: Ranee Gosselin, MD;  Location: [facility];  Service: Orthopedics;  Laterality: Right;   TOTAL KNEE ARTHROPLASTY Left 09/24/2018   Procedure: REVISION LEFT KNEE ARTHROPLASTY;  Surgeon: Durene Romans, MD;  Location: [facility];  Service: Orthopedics;  Laterality: Left;  70 mins    Home Medications:  Allergies as of 03/15/2023   No Known Allergies      Medication List        Accurate as of March 13, 2023  9:02 PM. If you have any questions, ask your nurse or doctor.          aspirin EC 81 MG tablet Take 81 mg by mouth daily. Swallow whole.   atorvastatin 20 MG tablet Commonly known as: LIPITOR Take 20 mg by mouth daily.   B-12 5000  MCG Subl Place under the tongue.   Ferrous Sulfate Dried 45 MG Tbcr Take 45 mg by mouth every other day.   hydrocortisone 2.5 % cream Apply topically daily. 3 times per week   lisinopril 5 MG tablet Commonly known as: ZESTRIL Take 0.5 tablets (2.5 mg total) by mouth daily.   meloxicam 15 MG tablet Commonly known as: MOBIC Take 15 mg by mouth as needed.   metFORMIN 500 MG tablet Commonly known as: GLUCOPHAGE Take 2 tablets with breakfast and 1 tablet with dinner daily.   pramipexole 0.75 MG tablet Commonly known as: MIRAPEX TAKE 1 TABLET TWICE A DAY   sildenafil 100 MG tablet Commonly known as: VIAGRA Take 1 tablet (100 mg total) by mouth daily as needed for erectile dysfunction.   SLEEP PO Take by mouth.   Vitamin D (Ergocalciferol) 50 MCG (2000 UT) Caps        Allergies: No Known  Allergies  Family History: Family History  Problem Relation Age of Onset   Alcohol abuse Mother    Rheum arthritis Mother    Stroke Maternal Grandmother    Stroke Paternal Grandfather    Dementia Paternal Grandmother    Cancer Neg Hx    Diabetes Neg Hx    Heart disease Neg Hx    Hypertension Neg Hx    Kidney disease Neg Hx    Prostate cancer Neg Hx     Social History:  reports that he has never smoked. He has never used smokeless tobacco. He reports that he does not drink alcohol and does not use drugs.  ROS: For pertinent review of systems please refer to history of present illness  Physical Exam: There were no vitals taken for this visit.  Constitutional:  Well nourished. Alert and oriented, No acute distress. HEENT: [place] AT, moist mucus membranes.  Trachea midline, no masses. Cardiovascular: No clubbing, cyanosis, or edema. Respiratory: Normal respiratory effort, no increased work of breathing. GI: Abdomen is soft, non tender, non distended, no abdominal masses. Liver and spleen not palpable.  No hernias appreciated.  Stool sample for occult testing is not indicated.   GU: No CVA tenderness.  No bladder fullness or masses.  Patient with circumcised/uncircumcised phallus. ***Foreskin easily retracted***  Urethral meatus is patent.  No penile discharge. No penile lesions or rashes. Scrotum without lesions, cysts, rashes and/or edema.  Testicles are located scrotally bilaterally. No masses are appreciated in the testicles. Left and right epididymis are normal. Rectal: Patient with  normal sphincter tone. Anus and perineum without scarring or rashes. No rectal masses are appreciated. Prostate is approximately *** grams, *** nodules are appreciated. Seminal vesicles are normal. Skin: No rashes, bruises or suspicious lesions. Lymph: No cervical or inguinal adenopathy. Neurologic: Grossly intact, no focal deficits, moving all 4 extremities. Psychiatric: Normal mood and affect.    Laboratory Data: Pending  Pertinent Imaging N/A   Assessment & Plan:    1. Erectile dysfunction -not at goal w/ sildenafil 20 mg, on-demand-dosing - taking up to three tablets -increase the sildenafil 100 mg, on-demand-dosing  2. BPH with LUTS -PSA stable -continue conservative management, avoiding bladder irritants and timed voiding's    No follow-ups on file.  These notes generated with voice recognition software. I apologize for typographical errors.  Cloretta Ned  [facility] [address] [place], [place] [address] [phone]

## 2023-03-14 ENCOUNTER — Encounter: Payer: Self-pay | Admitting: Psychiatry

## 2023-03-14 LAB — PSA: Prostate Specific Ag, Serum: 0.2 ng/mL (ref 0.0–4.0)

## 2023-03-14 NOTE — Telephone Encounter (Signed)
Last note mentions PCP may resume prescribing. LVM to [facility] to see if this is the case.

## 2023-03-15 ENCOUNTER — Encounter: Payer: Self-pay | Admitting: Urology

## 2023-03-15 ENCOUNTER — Ambulatory Visit: Payer: Medicare HMO | Admitting: Urology

## 2023-03-15 VITALS — BP 134/73 | HR 75

## 2023-03-15 DIAGNOSIS — N529 Male erectile dysfunction, unspecified: Secondary | ICD-10-CM | POA: Diagnosis not present

## 2023-03-15 DIAGNOSIS — N401 Enlarged prostate with lower urinary tract symptoms: Secondary | ICD-10-CM

## 2023-03-15 DIAGNOSIS — N138 Other obstructive and reflux uropathy: Secondary | ICD-10-CM

## 2023-03-15 NOTE — Telephone Encounter (Signed)
Sent My-Chart message

## 2023-03-16 NOTE — Telephone Encounter (Signed)
Called patient again and he said he has taken care of it.

## 2023-03-20 ENCOUNTER — Ambulatory Visit (INDEPENDENT_AMBULATORY_CARE_PROVIDER_SITE_OTHER): Payer: Medicare HMO | Admitting: Podiatry

## 2023-03-20 ENCOUNTER — Encounter: Payer: Self-pay | Admitting: Podiatry

## 2023-03-20 VITALS — Ht 68.0 in | Wt 257.8 lb

## 2023-03-20 DIAGNOSIS — M79675 Pain in left toe(s): Secondary | ICD-10-CM

## 2023-03-20 DIAGNOSIS — M79674 Pain in right toe(s): Secondary | ICD-10-CM | POA: Diagnosis not present

## 2023-03-20 DIAGNOSIS — B351 Tinea unguium: Secondary | ICD-10-CM

## 2023-03-20 NOTE — Progress Notes (Signed)
This patient returns to my office for at risk foot care.  This patient requires this care by a professional since this patient will be at risk due to having diabetes.  This patient is unable to cut nails himself since the patient cannot reach his nails.These nails are painful walking and wearing shoes.  This patient presents for at risk foot care today.  Patient presents with secondary complaint today of dry skin.  He would like to discuss treatment options he does not moisturize his foot.  He denies any other acute complaints.  General Appearance  Alert, conversant and in no acute stress.  Vascular  Dorsalis pedis and posterior tibial  pulses are palpable  bilaterally.  Capillary return is within normal limits  bilaterally. Temperature is within normal limits  bilaterally.  Neurologic  Senn-Weinstein monofilament wire test within normal limits  bilaterally. Muscle power within normal limits bilaterally.  Nails Thick disfigured discolored nails with subungual debris  from hallux to fifth toes bilaterally. No evidence of bacterial infection or drainage bilaterally.  Orthopedic  No limitations of motion  feet .  No crepitus or effusions noted.  No bony pathology or digital deformities noted.  Skin  normotropic skin with no porokeratosis noted bilaterally.  No signs of infections or ulcers noted.   Dry skin noted to both lower extremity.  Onychomycosis  Pain in right toes  Pain in left toes  Consent was obtained for treatment procedures.   Mechanical debridement of nails 1-5  bilaterally performed with a nail nipper.  Filed with dremel without incident.    Return office visit   10 weeks                   Told patient to return for periodic foot care and evaluation due to potential at risk complications.  Xerosis skin -Clinically improving    Kevin Patel D.P.M. 

## 2023-05-14 ENCOUNTER — Emergency Department: Payer: Medicare HMO

## 2023-05-14 ENCOUNTER — Emergency Department
Admission: EM | Admit: 2023-05-14 | Discharge: 2023-05-14 | Disposition: A | Discharge status: Home / Self Care | Payer: Medicare HMO | Attending: Emergency Medicine | Admitting: Emergency Medicine

## 2023-05-14 ENCOUNTER — Other Ambulatory Visit: Payer: Self-pay

## 2023-05-14 DIAGNOSIS — I10 Essential (primary) hypertension: Secondary | ICD-10-CM | POA: Diagnosis not present

## 2023-05-14 DIAGNOSIS — K529 Noninfective gastroenteritis and colitis, unspecified: Secondary | ICD-10-CM | POA: Insufficient documentation

## 2023-05-14 DIAGNOSIS — E119 Type 2 diabetes mellitus without complications: Secondary | ICD-10-CM | POA: Insufficient documentation

## 2023-05-14 DIAGNOSIS — K921 Melena: Secondary | ICD-10-CM | POA: Diagnosis present

## 2023-05-14 LAB — BASIC METABOLIC PANEL
Anion gap: 12 (ref 5–15)
BUN: 27 mg/dL — ABNORMAL HIGH (ref 8–23)
CO2: 21 mmol/L — ABNORMAL LOW (ref 22–32)
Calcium: 8.3 mg/dL — ABNORMAL LOW (ref 8.9–10.3)
Chloride: 108 mmol/L (ref 98–111)
Creatinine, Ser: 0.76 mg/dL (ref 0.61–1.24)
GFR, Estimated: >60 mL/min (ref >60)
Glucose, Bld: 105 mg/dL — ABNORMAL HIGH (ref 70–99)
Potassium: 4.2 mmol/L (ref 3.5–5.1)
Sodium: 141 mmol/L (ref 135–145)

## 2023-05-14 LAB — CBC
HCT: 41 % (ref 39.0–52.0)
Hemoglobin: 13.7 g/dL (ref 13.0–17.0)
MCH: 31.4 pg (ref 26.0–34.0)
MCHC: 33.4 g/dL (ref 30.0–36.0)
MCV: 93.8 fL (ref 80.0–100.0)
Platelets: 244 10*3/uL (ref 150–400)
RBC: 4.37 MIL/uL (ref 4.22–5.81)
RDW: 13.2 % (ref 11.5–15.5)
WBC: 5.7 10*3/uL (ref 4.0–10.5)
nRBC: 0 % (ref 0.0–0.2)

## 2023-05-14 LAB — HEPATIC FUNCTION PANEL
ALT: 26 U/L (ref 0–44)
AST: 26 U/L (ref 15–41)
Albumin: 3.7 g/dL (ref 3.5–5.0)
Alkaline Phosphatase: 56 U/L (ref 38–126)
Bilirubin, Direct: 0.1 mg/dL (ref 0.0–0.2)
Indirect Bilirubin: 0.6 mg/dL (ref 0.3–0.9)
Total Bilirubin: 0.7 mg/dL (ref 0.0–1.2)
Total Protein: 6.1 g/dL — ABNORMAL LOW (ref 6.5–8.1)

## 2023-05-14 LAB — CBG MONITORING, ED: Glucose-Capillary: 109 mg/dL — ABNORMAL HIGH (ref 70–99)

## 2023-05-14 MED ORDER — IOHEXOL 300 MG/ML  SOLN
100.0000 mL | Freq: Once | INTRAMUSCULAR | Status: AC | PRN
  Administered 2023-05-14: 100 mL via INTRAVENOUS

## 2023-05-14 MED ORDER — AZITHROMYCIN 500 MG PO TABS: 500.0000 mg | ORAL_TABLET | Freq: Every day | ORAL | 0 refills | Status: AC

## 2023-05-14 NOTE — ED Triage Notes (Signed)
 Pt to ED for blood in stool noticed last night.

## 2023-05-14 NOTE — Discharge Instructions (Signed)
 Please take the azithromycin  once a day for 5 days.  If you do not have an improvement in your symptoms after taking the antibiotic please return to the ED or your primary care provider for further evaluation.  At that point it would likely be necessary to get a stool sample so we can look for the specific bacteria causing the infection.  This may also be a viral infection that will resolve on its own, but we will cover with an antibiotic just to be safe.  Please return to the ED if you have any new or worsening symptoms like increasing abdominal pain or chest pain.

## 2023-05-14 NOTE — ED Provider Notes (Addendum)
 [facility] Provider Note    Event Date/Time   First MD Initiated Contact with Patient 05/14/23 1039     (approximate)   History   Rectal Bleeding   HPI  Duane Hughes is a 73 y.o. male with PMH of ectatic abdominal aorta, obesity, colon polyp, diabetes, hypertension and anxiety presents for evaluation of bloody diarrhea that began last night.  Patient states he has had around 7 episodes.  He denies abdominal pain but states he had some mild discomfort.  No nausea, vomiting or fevers.  Did go to [place] in November 2024 but no recent travel outside the [place] .  No changes in diet and no one else around him has been sick.      Physical Exam   Triage Vital Signs: ED Triage Vitals  Encounter Vitals Group     BP 05/14/23 0728 132/77     Systolic BP Percentile --      Diastolic BP Percentile --      Pulse Rate 05/14/23 0728 91     Resp 05/14/23 0728 18     Temp 05/14/23 0728 97.9 F (36.6 C)     Temp src --      SpO2 05/14/23 0728 97 %     Weight 05/14/23 0729 242 lb (109.8 kg)     Height 05/14/23 0729 5' 9 (1.753 m)     Head Circumference --      Peak Flow --      Pain Score 05/14/23 0729 0     Pain Loc --      Pain Education --      Exclude from Growth Chart --     Most recent vital signs: Vitals:   05/14/23 0728 05/14/23 1133  BP: 132/77 130/70  Pulse: 91 88  Resp: 18 18  Temp: 97.9 F (36.6 C) 98 F (36.7 C)  SpO2: 97% 98%    General: Awake, no distress.  CV:  Good peripheral perfusion.  RRR. Resp:  Normal effort.  CTAB. Abd:  No distention.  Soft, nontender to palpation, bowel sounds appropriate. Rectal:  No visible hemorrhoids or fissures     ED Results / Procedures / Treatments   Labs (all labs ordered are listed, but only abnormal results are displayed) Labs Reviewed  BASIC METABOLIC PANEL - Abnormal; Notable for the following components:      Result Value   CO2 21 (*)    Glucose, Bld 105 (*)    BUN 27 (*)     Calcium  8.3 (*)    All other components within normal limits  HEPATIC FUNCTION PANEL - Abnormal; Notable for the following components:   Total Protein 6.1 (*)    All other components within normal limits  CBG MONITORING, ED - Abnormal; Notable for the following components:   Glucose-Capillary 109 (*)    All other components within normal limits  GASTROINTESTINAL PANEL BY PCR, STOOL (REPLACES STOOL CULTURE)  C DIFFICILE QUICK SCREEN W PCR REFLEX    CBC    RADIOLOGY  CT abdomen pelvis obtained, interpreted the images as well as reviewed the radiologist report which was negative for any acute abnormalities.   PROCEDURES:  Critical Care performed: No  Procedures   MEDICATIONS ORDERED IN ED: Medications  iohexol  (OMNIPAQUE ) 300 MG/ML solution 100 mL (100 mLs Intravenous Contrast Given 05/14/23 1227)     IMPRESSION / MDM / ASSESSMENT AND PLAN / ED COURSE  I reviewed the triage vital signs and the  nursing notes.                             73 year old male presents for evaluation of multiple episodes of bloody diarrhea that began yesterday.  Vital signs are stable patient NAD on exam.  Differential diagnosis includes, but is not limited to, infectious colitis, diverticulitis, inflammatory bowel, ischemic colitis, hemorrhoids.  Patient's presentation is most consistent with acute complicated illness / injury requiring diagnostic workup.  Physical exam is reassuring, patient does not have any tenderness to palpation.  I did discuss this case with my attending physician, Dr. Levander.  Due to his history of an abdominal aortic aneurysm she thought it pertinent to get CT abdomen pelvis which is pending.  Hemoccult was positive.  CBC and CMP were unremarkable.  We will also evaluate for an infectious cause with a GI panel and C. difficile screen.  Patient was unable to provide a sample for the GI stool panel and C. difficile screen.  Given his CT scan is negative for any acute  abnormalities, we we will treat empirically for infectious diarrhea.  Patient was given strict return precautions.  He voiced understanding, all questions were answered and he was stable at discharge.     FINAL CLINICAL IMPRESSION(S) / ED DIAGNOSES   Final diagnoses:  Colitis presumed infectious     Rx / DC Orders   ED Discharge Orders          Ordered    azithromycin  (ZITHROMAX ) 500 MG tablet  Daily        05/14/23 1447             Note:  This document was prepared using Dragon voice recognition software and may include unintentional dictation errors.   Cleaster Tinnie LABOR, PA-C 05/14/23 1140    Cleaster Tinnie LABOR, PA-C 05/14/23 1456    Levander Slate, MD 05/14/23 1535

## 2023-05-16 ENCOUNTER — Ambulatory Visit: Payer: Medicare HMO | Admitting: Dermatology

## 2023-06-04 ENCOUNTER — Other Ambulatory Visit: Payer: Self-pay

## 2023-06-04 MED ORDER — KETOCONAZOLE 2 % EX CREA: TOPICAL_CREAM | CUTANEOUS | 11 refills | Status: AC

## 2023-06-13 ENCOUNTER — Ambulatory Visit (INDEPENDENT_AMBULATORY_CARE_PROVIDER_SITE_OTHER): Payer: Medicare HMO | Admitting: Dermatology

## 2023-06-13 ENCOUNTER — Encounter: Payer: Self-pay | Admitting: Dermatology

## 2023-06-13 DIAGNOSIS — D229 Melanocytic nevi, unspecified: Secondary | ICD-10-CM

## 2023-06-13 DIAGNOSIS — Z85828 Personal history of other malignant neoplasm of skin: Secondary | ICD-10-CM

## 2023-06-13 DIAGNOSIS — W908XXA Exposure to other nonionizing radiation, initial encounter: Secondary | ICD-10-CM

## 2023-06-13 DIAGNOSIS — Z7189 Other specified counseling: Secondary | ICD-10-CM

## 2023-06-13 DIAGNOSIS — L578 Other skin changes due to chronic exposure to nonionizing radiation: Secondary | ICD-10-CM

## 2023-06-13 DIAGNOSIS — L821 Other seborrheic keratosis: Secondary | ICD-10-CM

## 2023-06-13 DIAGNOSIS — Z1283 Encounter for screening for malignant neoplasm of skin: Secondary | ICD-10-CM

## 2023-06-13 DIAGNOSIS — L820 Inflamed seborrheic keratosis: Secondary | ICD-10-CM | POA: Diagnosis not present

## 2023-06-13 DIAGNOSIS — L219 Seborrheic dermatitis, unspecified: Secondary | ICD-10-CM

## 2023-06-13 DIAGNOSIS — Z86007 Personal history of in-situ neoplasm of skin: Secondary | ICD-10-CM

## 2023-06-13 DIAGNOSIS — Z79899 Other long term (current) drug therapy: Secondary | ICD-10-CM

## 2023-06-13 DIAGNOSIS — D1801 Hemangioma of skin and subcutaneous tissue: Secondary | ICD-10-CM

## 2023-06-13 DIAGNOSIS — L814 Other melanin hyperpigmentation: Secondary | ICD-10-CM | POA: Diagnosis not present

## 2023-06-13 DIAGNOSIS — Z872 Personal history of diseases of the skin and subcutaneous tissue: Secondary | ICD-10-CM

## 2023-06-13 DIAGNOSIS — L570 Actinic keratosis: Secondary | ICD-10-CM | POA: Diagnosis not present

## 2023-06-13 DIAGNOSIS — Z8589 Personal history of malignant neoplasm of other organs and systems: Secondary | ICD-10-CM

## 2023-06-13 NOTE — Patient Instructions (Addendum)

## 2023-06-13 NOTE — Progress Notes (Signed)
Follow-Up Visit   Subjective  Duane Hughes is a 73 y.o. male who presents for the following: Skin Cancer Screening and Full Body Skin Exam Hx of aks, hx of scc  The patient presents for Total-Body Skin Exam (TBSE) for skin cancer screening and mole check. The patient has spots, moles and lesions to be evaluated, some may be new or changing and the patient may have concern these could be cancer.  The following portions of the chart were reviewed this encounter and updated as appropriate: medications, allergies, medical history  Review of Systems:  No other skin or systemic complaints except as noted in HPI or Assessment and Plan.  Objective  Well appearing patient in no apparent distress; mood and affect are within normal limits.  A full examination was performed including scalp, head, eyes, ears, nose, lips, neck, chest, axillae, abdomen, back, buttocks, bilateral upper extremities, bilateral lower extremities, hands, feet, fingers, toes, fingernails, and toenails. All findings within normal limits unless otherwise noted below.   Relevant physical exam findings are noted in the Assessment and Plan.  face and hands x 18 (18) Erythematous thin papules/macules with gritty scale.  b/l arms x 16 (16) Erythematous stuck-on, waxy papule or plaque  Assessment & Plan   SKIN CANCER SCREENING PERFORMED TODAY.  ACTINIC DAMAGE - Chronic condition, secondary to cumulative UV/sun exposure - diffuse scaly erythematous macules with underlying dyspigmentation - Recommend daily broad spectrum sunscreen SPF 30+ to sun-exposed areas, reapply every 2 hours as needed.  - Staying in the shade or wearing long sleeves, sun glasses (UVA+UVB protection) and wide brim hats (4-inch brim around the entire circumference of the hat) are also recommended for sun protection.  - Call for new or changing lesions.  LENTIGINES, SEBORRHEIC KERATOSES, HEMANGIOMAS - Benign normal skin lesions - Benign-appearing -  Call for any changes  MELANOCYTIC NEVI - Tan-brown and/or pink-flesh-colored symmetric macules and papules - Benign appearing on exam today - Observation - Call clinic for new or changing moles - Recommend daily use of broad spectrum spf 30+ sunscreen to sun-exposed areas.   Seborrheic dermatitis Face Improved on treatment. Exam: clear at exam Chronic condition with duration or expected duration over one year. Currently well-controlled. Seborrheic Dermatitis  -  is a chronic persistent rash characterized by pinkness and scaling most commonly of the mid face but also can occur on the scalp (dandruff), ears; mid chest and mid back. It tends to be exacerbated by stress and cooler weather.  People who have neurologic disease may experience new onset or exacerbation of existing seborrheic dermatitis.  The condition is not curable but treatable and can be controlled. 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  - Call if any new or changing lesions are noted between office visits  HISTORY OF SQUAMOUS CELL CARCINOMA OF THE SKIN Right dorsum hand 06/23/2015 - No evidence of recurrence today - No lymphadenopathy - Recommend regular full body skin exams - Recommend daily broad spectrum sunscreen SPF 30+ to sun-exposed areas, reapply every 2 hours as needed.  - Call if any new or changing lesions are noted between office visits  ACTINIC KERATOSIS (18) face and hands  x 18 (18) Residual aks   Actinic keratoses are precancerous spots that appear secondary to cumulative UV radiation exposure/sun exposure over time. They are chronic with expected duration over 1 year. A portion of  actinic keratoses will progress to squamous cell carcinoma of the skin. It is not possible to reliably predict which spots will progress to skin cancer and so treatment is recommended to prevent development of skin cancer.  Recommend daily broad spectrum sunscreen SPF 30+ to sun-exposed areas, reapply every 2 hours as needed.  Recommend staying in the shade or wearing long sleeves, sun glasses (UVA+UVB protection) and wide brim hats (4-inch brim around the entire circumference of the hat). Call for new or changing lesions. Destruction of lesion - face and hands x 18 (18) Complexity: simple   Destruction method: cryotherapy   Informed consent: discussed and consent obtained   Timeout:  patient name, date of birth, surgical site, and procedure verified Lesion destroyed using liquid nitrogen: Yes   Region frozen until ice ball extended beyond lesion: Yes   Outcome: patient tolerated procedure well with no complications   Post-procedure details: wound care instructions given   INFLAMED SEBORRHEIC KERATOSIS (16) b/l arms x 16 (16) Symptomatic, irritating, patient would like treated. Destruction of lesion - b/l arms x 16 (16) Complexity: simple   Destruction method: cryotherapy   Informed consent: discussed and consent obtained   Timeout:  patient name, date of birth, surgical site, and procedure verified Lesion destroyed using liquid nitrogen: Yes   Region frozen until ice ball extended beyond lesion: Yes   Outcome: patient tolerated procedure well with no complications   Post-procedure details: wound care instructions given   Return in about 1 year (around 06/12/2024) for TBSE.  IAsher Muir, CMA, am acting as scribe for Armida Sans, MD.   Documentation: I have reviewed the above documentation for accuracy and completeness, and I agree with the above.  Armida Sans, MD

## 2023-09-18 ENCOUNTER — Encounter (INDEPENDENT_AMBULATORY_CARE_PROVIDER_SITE_OTHER): Payer: Self-pay

## 2023-10-16 ENCOUNTER — Ambulatory Visit (INDEPENDENT_AMBULATORY_CARE_PROVIDER_SITE_OTHER): Admitting: Podiatry

## 2023-10-16 DIAGNOSIS — M79674 Pain in right toe(s): Secondary | ICD-10-CM

## 2023-10-16 DIAGNOSIS — B351 Tinea unguium: Secondary | ICD-10-CM

## 2023-10-16 DIAGNOSIS — M79675 Pain in left toe(s): Secondary | ICD-10-CM

## 2023-10-16 NOTE — Progress Notes (Signed)
This patient returns to my office for at risk foot care.  This patient requires this care by a professional since this patient will be at risk due to having diabetes.  This patient is unable to cut nails himself since the patient cannot reach his nails.These nails are painful walking and wearing shoes.  This patient presents for at risk foot care today.  Patient presents with secondary complaint today of dry skin.  He would like to discuss treatment options he does not moisturize his foot.  He denies any other acute complaints.  General Appearance  Alert, conversant and in no acute stress.  Vascular  Dorsalis pedis and posterior tibial  pulses are palpable  bilaterally.  Capillary return is within normal limits  bilaterally. Temperature is within normal limits  bilaterally.  Neurologic  Senn-Weinstein monofilament wire test within normal limits  bilaterally. Muscle power within normal limits bilaterally.  Nails Thick disfigured discolored nails with subungual debris  from hallux to fifth toes bilaterally. No evidence of bacterial infection or drainage bilaterally.  Orthopedic  No limitations of motion  feet .  No crepitus or effusions noted.  No bony pathology or digital deformities noted.  Skin  normotropic skin with no porokeratosis noted bilaterally.  No signs of infections or ulcers noted.   Dry skin noted to both lower extremity.  Onychomycosis  Pain in right toes  Pain in left toes  Consent was obtained for treatment procedures.   Mechanical debridement of nails 1-5  bilaterally performed with a nail nipper.  Filed with dremel without incident.    Return office visit   10 weeks                   Told patient to return for periodic foot care and evaluation due to potential at risk complications.  Xerosis skin -Clinically improving    Kevin Patel D.P.M. 

## 2023-10-22 NOTE — Progress Notes (Unsigned)
 MRN : [id number]  Duane Hughes is a 73 y.o. (24-Sep-1950) male who presents with chief complaint of check circulation.  History of Present Illness:   The patient returns to the office for surveillance of a known abdominal aortic aneurysm. Patient denies abdominal pain or back pain, no other abdominal complaints. No changes suggesting embolic episodes.   He notes his swelling has been very well-controlled.  He continues to wear graduated compression on a daily basis.   There have been no interval changes in the patient's overall health care since his last visit.     Patient denies amaurosis fugax or TIA symptoms. There is no history of claudication or rest pain symptoms of the lower extremities. The patient denies angina or shortness of breath.    Duplex ultrasound shows an AAA that measures 2.64 cm, the previous measurement was 2.81 cm.  No significant change compared to last year.  No outpatient medications have been marked as taking for the 10/25/23 encounter (Appointment) with Jama, Cordella MATSU, MD.    Past Medical History:  Diagnosis Date   Anemia    Anxiety    Dr.Jessica Franchot . well controlled   Arthritis    back, hands, knees   Colon polyp 05/09/2016   TUBULAR ADENOMAS    Depression    Diabetes mellitus without complication (HCC)    type 2   Ectatic abdominal aorta (HCC) 04/18/2016   2.9 cm on US  2016, unchanged 2017   History of hiatal hernia    20 years ago   History of skin cancer    hand   Hyperlipidemia    Hypertension 2015   Dr. Hinton   Morbid obesity (HCC) 11/11/2014   Neuropathy    Obesity 11/11/2014   Sleep apnea    cpap   Squamous cell carcinoma of skin 04/11/2018   SCC In Situ R dorsum wrist   Squamous cell carcinoma of skin 06/23/2015   R dorsum hand    Past Surgical History:  Procedure Laterality Date   COLONOSCOPY WITH PROPOFOL  N/A 05/09/2016   Procedure: COLONOSCOPY WITH  PROPOFOL ;  Surgeon: Rogelia Copping, MD;  Location: [facility];  Service: Endoscopy;  Laterality: N/A;   HERNIA REPAIR     Hiatal   JOINT REPLACEMENT     04-04-17 Dr. Heide   Right knee   KNEE ARTHROSCOPY     several scopes done   KNEE CLOSED REDUCTION Right 05/31/2017   Procedure: CLOSED MANIPULATION RIGHT KNEE;  Surgeon: Heide Ingle, MD;  Location: [facility];  Service: Orthopedics;  Laterality: Right;  Femoral Block   LUMBAR LAMINECTOMY/DECOMPRESSION MICRODISCECTOMY N/A 01/26/2020   Procedure: L3, L4 LAMINECTOMIES;  Surgeon: Bluford Standing, MD;  Location: [facility];  Service: Neurosurgery;  Laterality: N/A;   MEDIAL PARTIAL KNEE REPLACEMENT Left    2000s?   SKIN CANCER EXCISION  Feb 2017   removed from right hand   TOTAL KNEE ARTHROPLASTY Right 04/04/2017   Procedure: RIGHT TOTAL KNEE ARTHROPLASTY;  Surgeon: Heide Ingle, MD;  Location: [facility];  Service: Orthopedics;  Laterality: Right;   TOTAL KNEE ARTHROPLASTY Left 09/24/2018  Procedure: REVISION LEFT KNEE ARTHROPLASTY;  Surgeon: Ernie Cough, MD;  Location: [facility];  Service: Orthopedics;  Laterality: Left;  70 mins    Social History Social History   Tobacco Use   Smoking status: Never   Smokeless tobacco: Never  Vaping Use   Vaping status: Never Used  Substance Use Topics   Alcohol use: No    Alcohol/week: 0.0 standard drinks of alcohol   Drug use: No    Family History Family History  Problem Relation Age of Onset   Alcohol abuse Mother    Rheum arthritis Mother    Stroke Maternal Grandmother    Stroke Paternal Grandfather    Dementia Paternal Grandmother    Cancer Neg Hx    Diabetes Neg Hx    Heart disease Neg Hx    Hypertension Neg Hx    Kidney disease Neg Hx    Prostate cancer Neg Hx     Allergies  Allergen Reactions   Vilazodone       REVIEW OF SYSTEMS (Negative unless checked)  Constitutional: [] Weight loss  [] Fever  [] Chills Cardiac: [] Chest pain   [] Chest pressure   [] Palpitations   [] Shortness  of breath when laying flat   [] Shortness of breath with exertion. Vascular:  [x] Pain in legs with walking   [] Pain in legs at rest  [] History of DVT   [] Phlebitis   [] Swelling in legs   [] Varicose veins   [] Non-healing ulcers Pulmonary:   [] Uses home oxygen   [] Productive cough   [] Hemoptysis   [] Wheeze  [] COPD   [] Asthma Neurologic:  [] Dizziness   [] Seizures   [] History of stroke   [] History of TIA  [] Aphasia   [] Vissual changes   [] Weakness or numbness in arm   [] Weakness or numbness in leg Musculoskeletal:   [] Joint swelling   [x] Joint pain   [x] Low back pain Hematologic:  [] Easy bruising  [] Easy bleeding   [] Hypercoagulable state   [] Anemic Gastrointestinal:  [] Diarrhea   [] Vomiting  [] Gastroesophageal reflux/heartburn   [] Difficulty swallowing. Genitourinary:  [] Chronic kidney disease   [] Difficult urination  [] Frequent urination   [] Blood in urine Skin:  [] Rashes   [] Ulcers  Psychological:  [] History of anxiety   [x]  History of major depression.  Physical Examination  There were no vitals filed for this visit. There is no height or weight on file to calculate BMI. Gen: WD/WN, NAD Head: [place]/AT, No temporalis wasting.  Ear/Nose/Throat: Hearing grossly intact, nares w/o erythema or drainage Eyes: PER, EOMI, sclera nonicteric.  Neck: Supple, no masses.  No bruit or JVD.  Pulmonary:  Good air movement, no audible wheezing, no use of accessory muscles.  Cardiac: RRR, normal S1, S2, no Murmurs. Vascular:  no open wounds, minimal edema.  Patient is wearing graduated compression Vessel Right Left  Radial Palpable Palpable  Gastrointestinal: soft, non-distended. No guarding/no peritoneal signs.  Musculoskeletal: M/S 5/5 throughout.  No visible deformity.  Neurologic: CN 2-12 intact. Pain and light touch intact in extremities.  Symmetrical.  Speech is fluent. Motor exam as listed above. Psychiatric: Judgment intact, Mood & affect appropriate for pt's clinical situation. Dermatologic: No rashes  or ulcers noted.  No changes consistent with cellulitis.   CBC Lab Results  Component Value Date   WBC 5.7 05/14/2023   HGB 13.7 05/14/2023   HCT 41.0 05/14/2023   MCV 93.8 05/14/2023   PLT 244 05/14/2023    BMET    Component Value Date/Time   NA 141 05/14/2023 0729   NA 139 05/12/2015 1537  K 4.2 05/14/2023 0729   CL 108 05/14/2023 0729   CO2 21 (L) 05/14/2023 0729   GLUCOSE 105 (H) 05/14/2023 0729   BUN 27 (H) 05/14/2023 0729   BUN 15 05/12/2015 1537   CREATININE 0.76 05/14/2023 0729   CREATININE 0.90 06/07/2020 0812   CALCIUM  8.3 (L) 05/14/2023 0729   GFRNONAA >60 05/14/2023 0729   GFRNONAA 86 06/07/2020 0812   GFRAA 100 06/07/2020 0812   CrCl cannot be calculated (Patient's most recent lab result is older than the maximum 21 days allowed.).  COAG Lab Results  Component Value Date   INR 1.0 01/22/2020   INR 0.98 03/29/2017    Radiology No results found.   Assessment/Plan 1. Infrarenal abdominal aortic aneurysm (AAA) without rupture (HCC) (Primary) Recommend: No surgery or intervention is indicated at this time.  The patient has an asymptomatic abdominal aortic aneurysm that is less than 4 cm in maximal diameter.    I have reviewed the natural history of abdominal aortic aneurysm and the small risk of rupture for aneurysm less than 5 cm in size.  However, as these small aneurysms tend to enlarge over time, continued surveillance with ultrasound or CT scan is mandatory.   I have also discussed optimizing medical management with hypertension and lipid control and the negative effect that any tobacco products have on aneurysmal disease.  The patient is also encouraged to exercise a minimum of 30 minutes 4 times a week.   Should the patient develop new onset abdominal or back pain or signs of peripheral embolization they are instructed to seek medical attention immediately and to alert the physician providing care that they have an aneurysm.   The patient  voices their understanding.  The patient will return in 24 months with an aortic duplex. - VAS US  AORTA/IVC/ILIACS; Future  2. Lymphedema Recommend:  No surgery or intervention at this point in time.    The patient is doing well with compression and will continue wearing graduated compression on a daily basis. The patient will  continue wearing the compression first thing in the morning and removing them in the evening. The patient has been instructed specifically not to sleep in the compression.    In addition, behavioral modification including elevation during the day and exercise as tolerated will be continued.    Patient should follow-up as ordered  3. Benign hypertension Continue antihypertensive medications as already ordered, these medications have been reviewed and there are no changes at this time.  4. Type 2 diabetes mellitus with obesity (HCC) Continue hypoglycemic medications as already ordered, these medications have been reviewed and there are no changes at this time.  Hgb A1C to be monitored as already arranged by primary service  5. Dyslipidemia Continue statin as ordered and reviewed, no changes at this time    Cordella Shawl, MD  10/22/2023 9:06 AM

## 2023-10-25 ENCOUNTER — Encounter (INDEPENDENT_AMBULATORY_CARE_PROVIDER_SITE_OTHER): Payer: Self-pay | Admitting: Vascular Surgery

## 2023-10-25 ENCOUNTER — Ambulatory Visit (INDEPENDENT_AMBULATORY_CARE_PROVIDER_SITE_OTHER): Payer: Medicare HMO | Admitting: Vascular Surgery

## 2023-10-25 ENCOUNTER — Ambulatory Visit (INDEPENDENT_AMBULATORY_CARE_PROVIDER_SITE_OTHER): Payer: Medicare HMO

## 2023-10-25 VITALS — BP 126/68 | HR 71 | Ht 68.0 in | Wt 228.1 lb

## 2023-10-25 DIAGNOSIS — E1169 Type 2 diabetes mellitus with other specified complication: Secondary | ICD-10-CM | POA: Diagnosis not present

## 2023-10-25 DIAGNOSIS — E785 Hyperlipidemia, unspecified: Secondary | ICD-10-CM

## 2023-10-25 DIAGNOSIS — I890 Lymphedema, not elsewhere classified: Secondary | ICD-10-CM

## 2023-10-25 DIAGNOSIS — I7143 Infrarenal abdominal aortic aneurysm, without rupture: Secondary | ICD-10-CM | POA: Diagnosis not present

## 2023-10-25 DIAGNOSIS — E669 Obesity, unspecified: Secondary | ICD-10-CM

## 2023-10-25 DIAGNOSIS — I10 Essential (primary) hypertension: Secondary | ICD-10-CM

## 2023-11-16 IMAGING — MR MR ORBITS WO/W CM
4 of 7 series · 15 of 48 positions shown · IV contrast (20 mL Multihance)
Comparison: No pertinent prior exam.

CLINICAL DATA: Orbital tumor. Additional history provided by
scanning technologist: Patient reports weakness and fatigue in legs,
tingling in bilateral hands, dropping things. Recent CT scan
demonstrating an orbital tumor. MRI recommended.

EXAM:
MRI OF THE ORBITS WITHOUT AND WITH CONTRAST
TECHNIQUE: Multiplanar, multi-echo pulse sequences of the orbits and
surrounding structures were acquired including fat saturation
techniques, before and after intravenous contrast administration.
CONTRAST:  20mL MULTIHANCE GADOBENATE DIMEGLUMINE 529 MG/ML IV SOLN

[Series 5: T1 · sagittal · 4.0mm · 0.75mm/px · 3 of 29 slices shown]
[im 5/29]
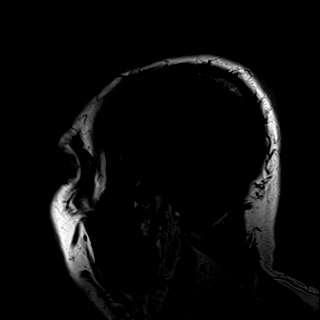
[im 17/29]
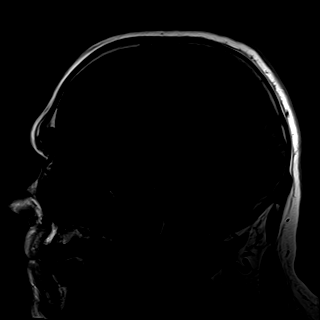
[im 25/29]
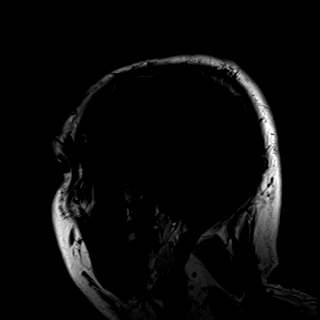

[Series 6: T2 fat-sat · coronal · 3.0mm · 0.25mm/px · 3 of 30 slices shown (1 of 2)]
[im 5/30]
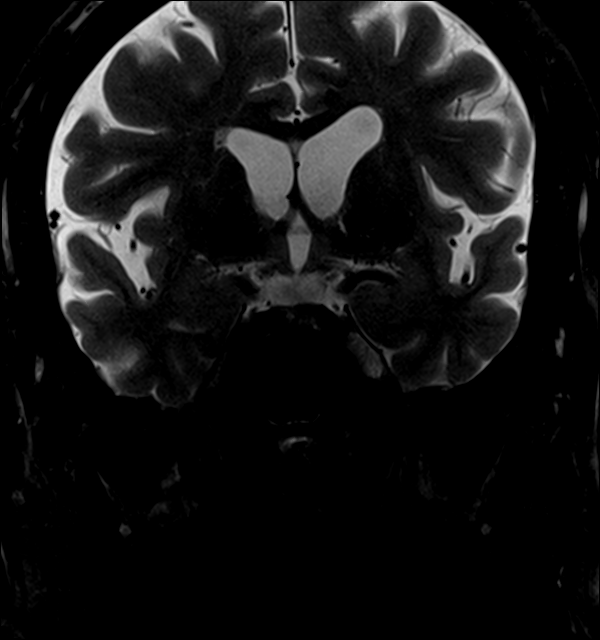
[im 17/30]
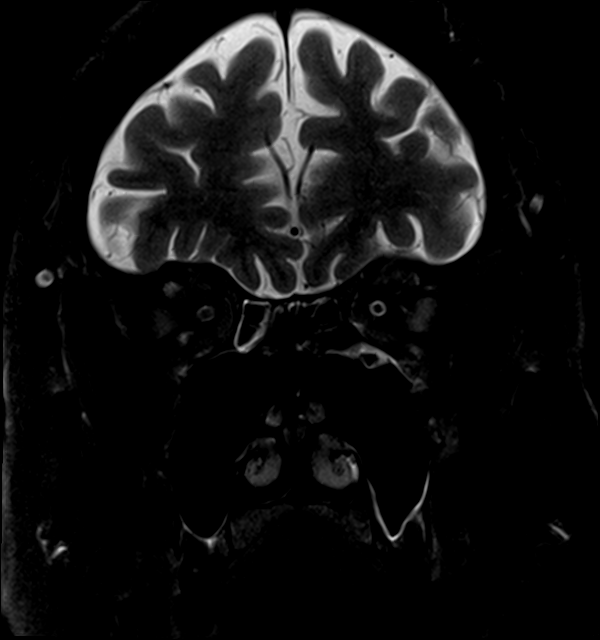
[im 25/30]
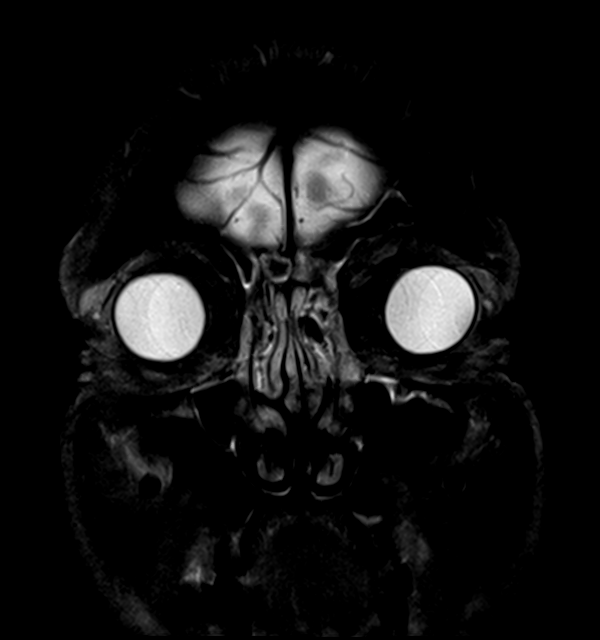

[Series 7: T2 fat-sat · axial · 3.0mm · 0.25mm/px · z∈[-10,+41]mm · 3 of 23 slices shown (2 of 2)]
[im 5/23]
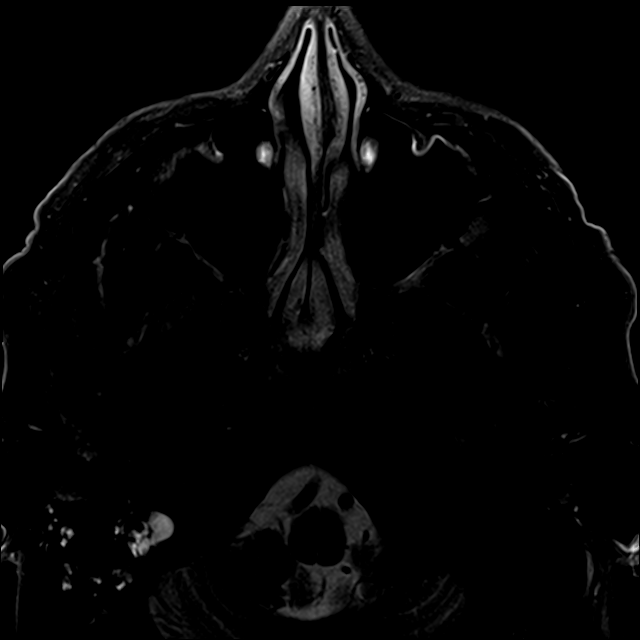
[im 14/23]
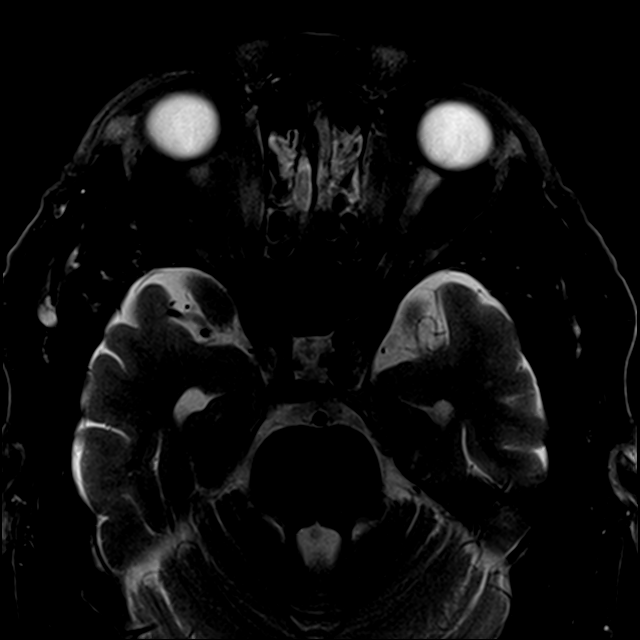
[im 23/23]
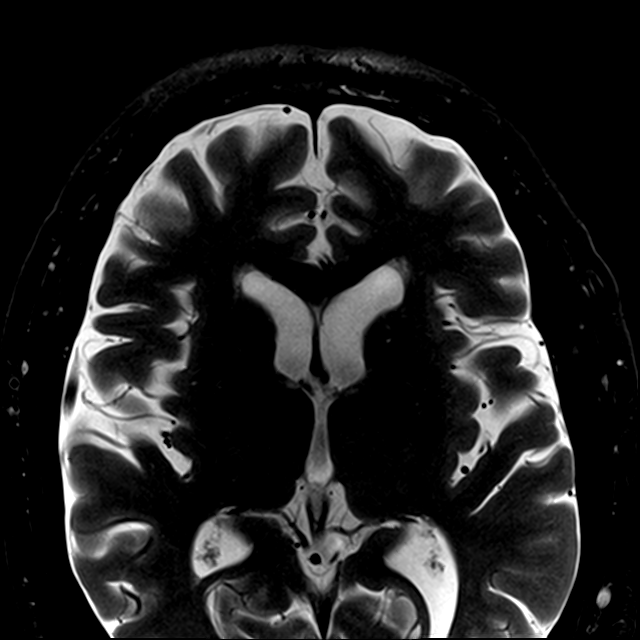

[Series 11: T1 post-contrast · coronal · 3.0mm · 0.25mm/px · 6 of 29 slices shown]
[im 1/29]
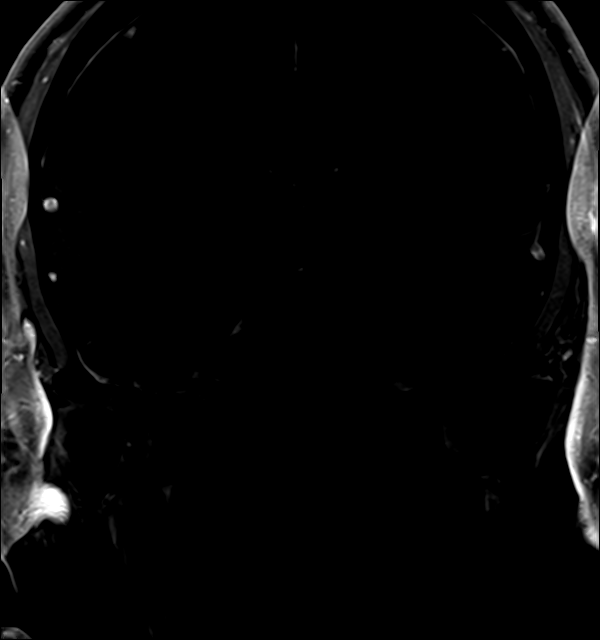
[im 5/29]
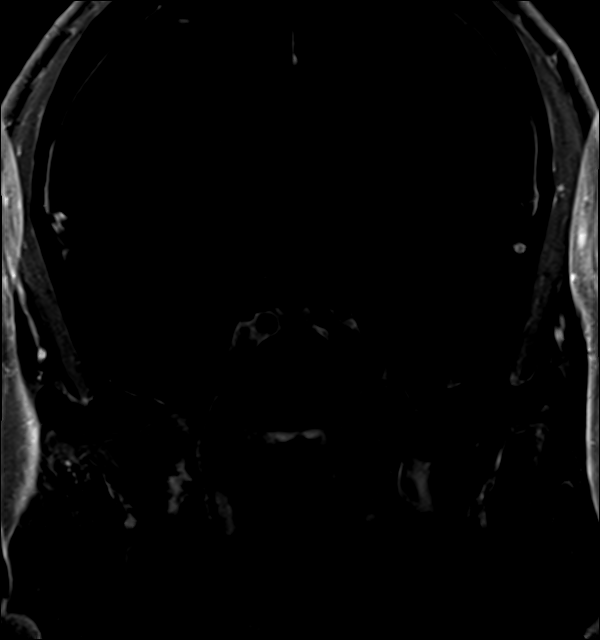
[im 10/29]
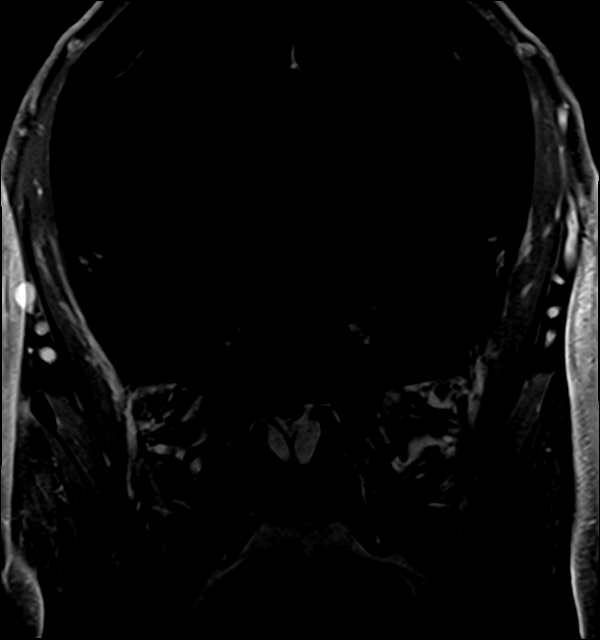
[im 15/29]
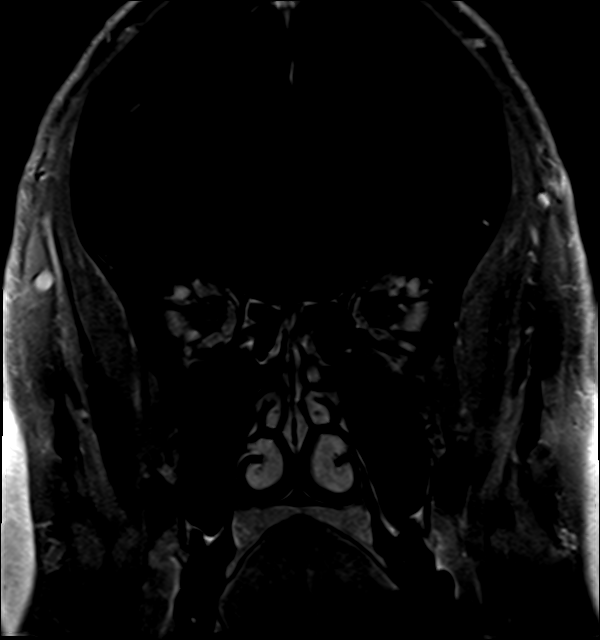
[im 19/29]
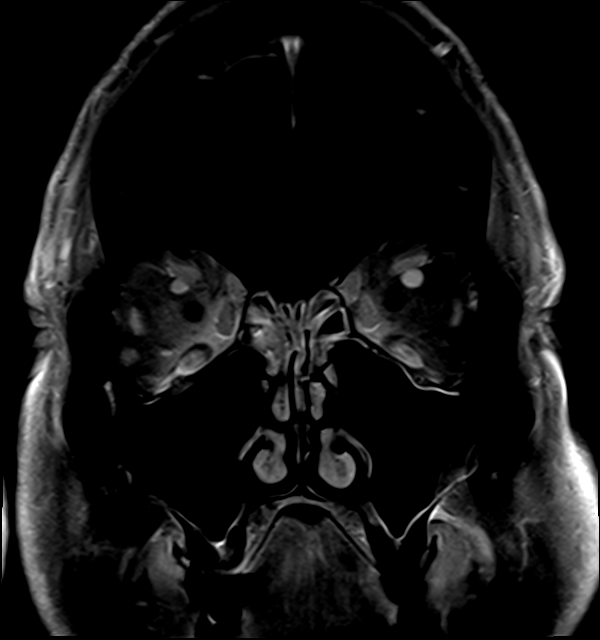
[im 24/29]
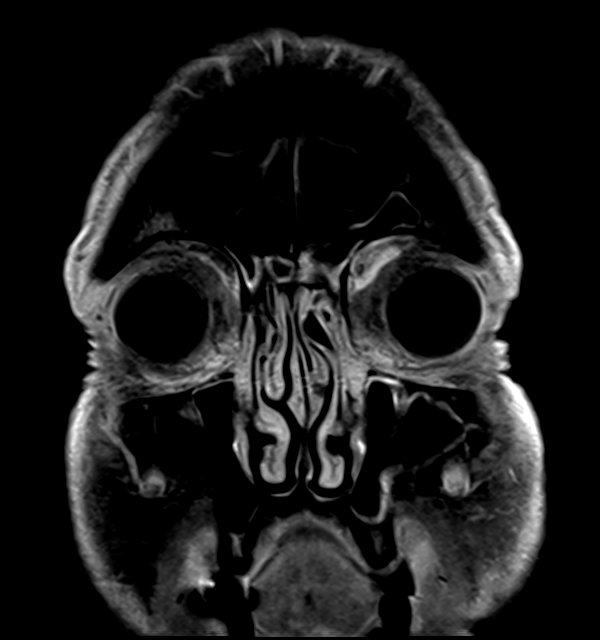

[15 of 48 positions shown; findings below may reference images not displayed]

FINDINGS: Orbits:

Within the inferolateral extraconal right orbit, there is a 1.2 x
0.7 cm ovoid mass which is hyperintense on T2-weighted imaging,
intermediate signal to hypointense on T1-weighted imaging and avidly
enhancing (for instance as seen on series 10, images 8-10). The mass
demonstrates slightly heterogeneous signal. The globes are normal in
size and contour. The extraocular muscles, optic nerve sheath
complexes and lacrimal glands are symmetric and unremarkable.

Visualized sinuses: Mild mucosal thickening within the bilateral
frontal sinuses. Moderate mucosal thickening within the bilateral
ethmoid sinuses. Trace mucosal thickening within the bilateral
maxillary sinuses at the imaged levels.

Soft tissues: The visualized maxillofacial and upper neck soft
tissues are unremarkable.

Limited intracranial: No evidence of acute intracranial abnormality
within the field of view.

Other: Small right mastoid effusion.
IMPRESSION: 1.2 x 0.7 cm ovoid enhancing mass within the inferolateral
extraconal right orbit, as described and unchanged in size from the
recent prior head CT of 06/15/2021. This may reflect a schwannoma.
However, the imaging features are nonspecific and alternative
etiologies cannot be excluded. Initial short-interval 3-6 month MRI
follow-up is recommended to ensure stability.

Paranasal sinus disease at the imaged levels, as described.

Small right mastoid effusion.

## 2023-11-16 IMAGING — MR MR CERVICAL SPINE W/O CM
5 series · 28 of 48 positions shown · non-contrast
Comparison: None.

CLINICAL DATA: Weakness in legs. Spinal stenosis. Tingling in
bilateral hands. Dropping things.

EXAM:
MRI CERVICAL SPINE WITHOUT CONTRAST
TECHNIQUE: Multiplanar, multisequence MR imaging of the cervical spine was
performed. No intravenous contrast was administered.

[Series 6: T1 · sagittal · 3.0mm · 0.66mm/px · 6 of 15 slices shown]
[im 1/15]
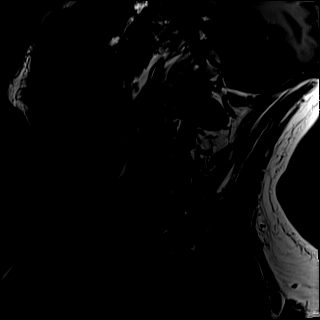
[im 3/15]
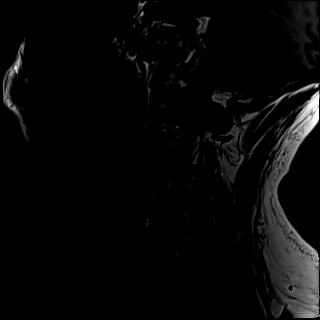
[im 6/15]
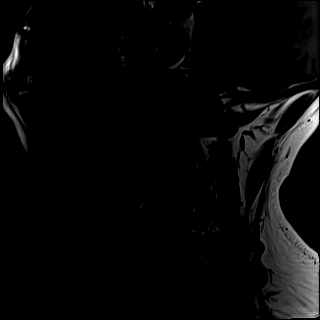
[im 9/15]
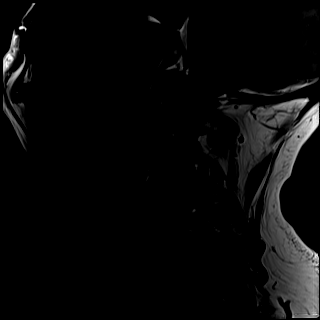
[im 12/15]
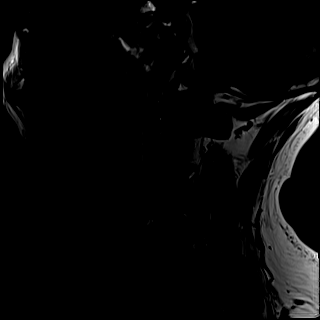
[im 15/15]
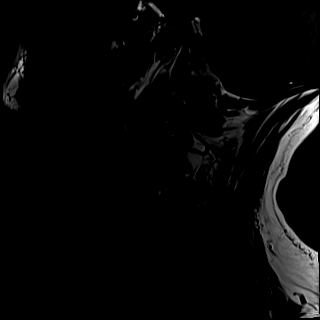

[Series 7: T2 · sagittal · 3.0mm · 0.55mm/px · 6 of 15 slices shown (1 of 2)]
[im 1/15]
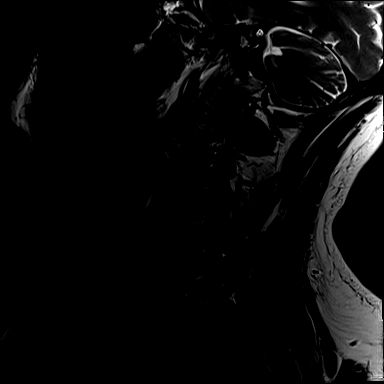
[im 3/15]
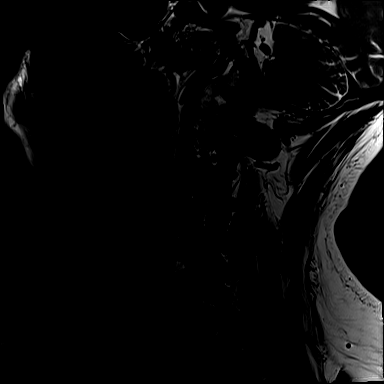
[im 6/15]
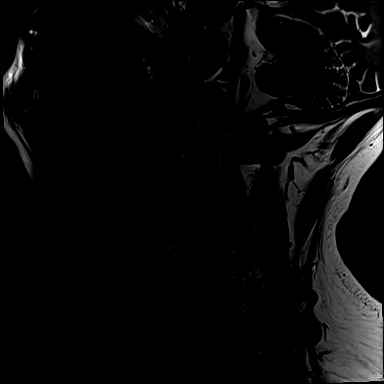
[im 9/15]
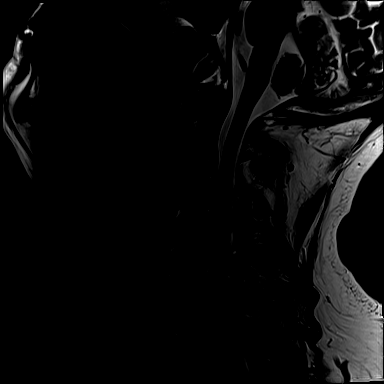
[im 12/15]
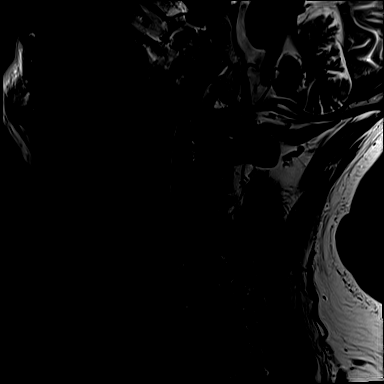
[im 15/15]
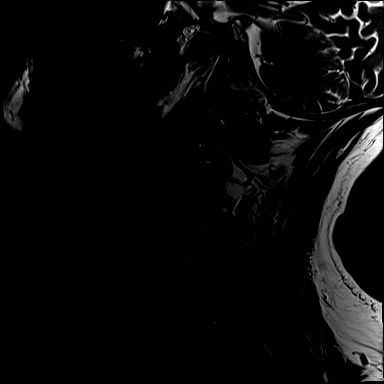

[Series 8: STIR · sagittal · 3.0mm · 0.33mm/px · 6 of 15 slices shown]
[im 1/15]
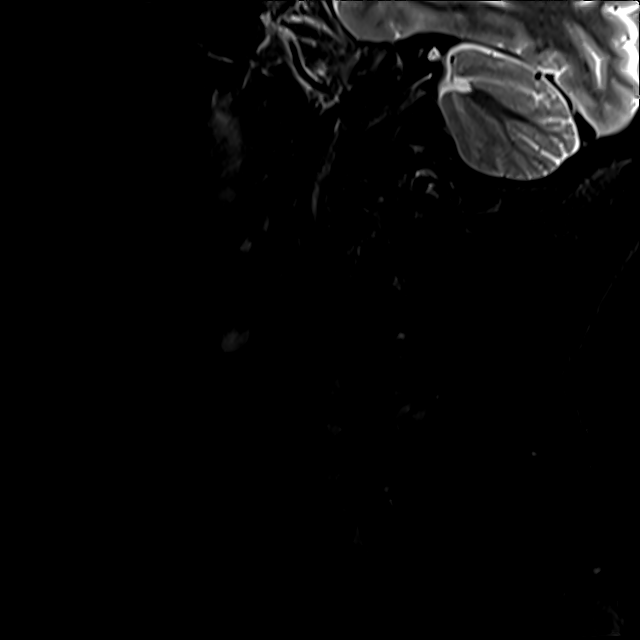
[im 3/15]
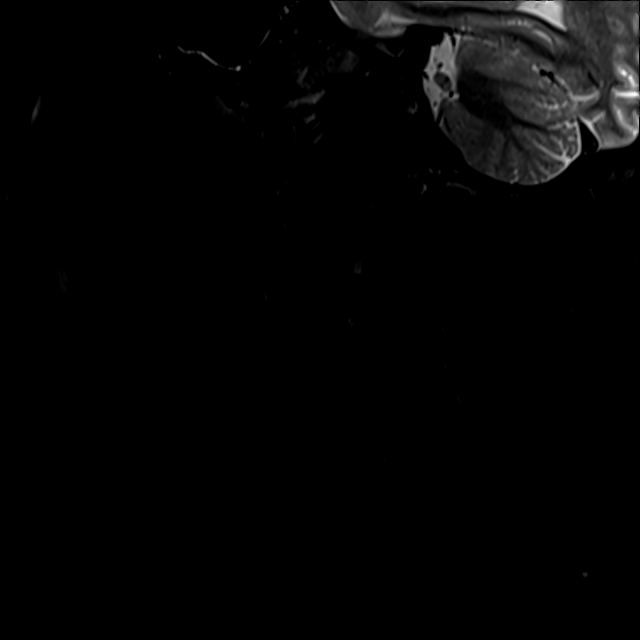
[im 6/15]
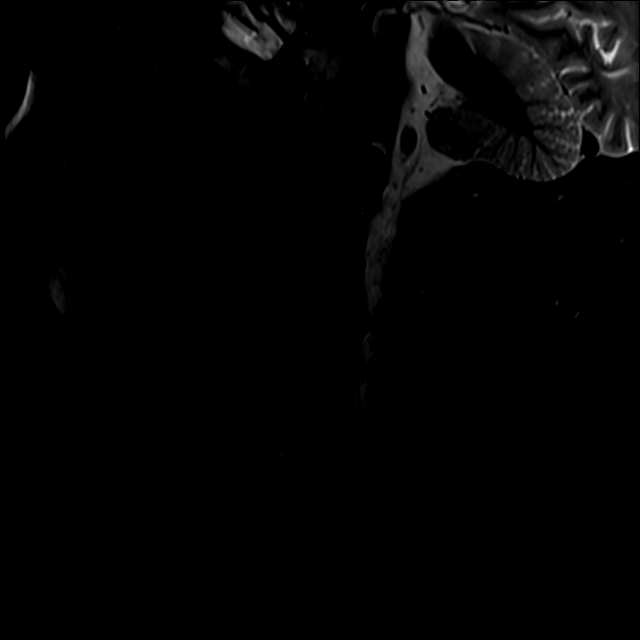
[im 9/15]
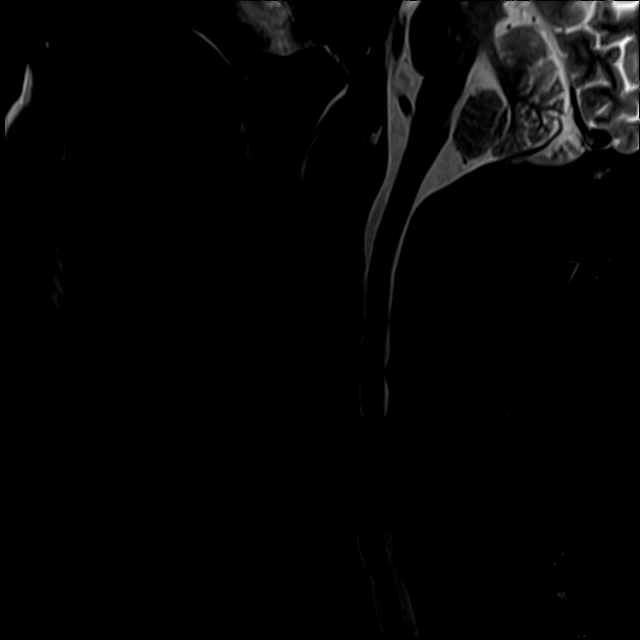
[im 12/15]
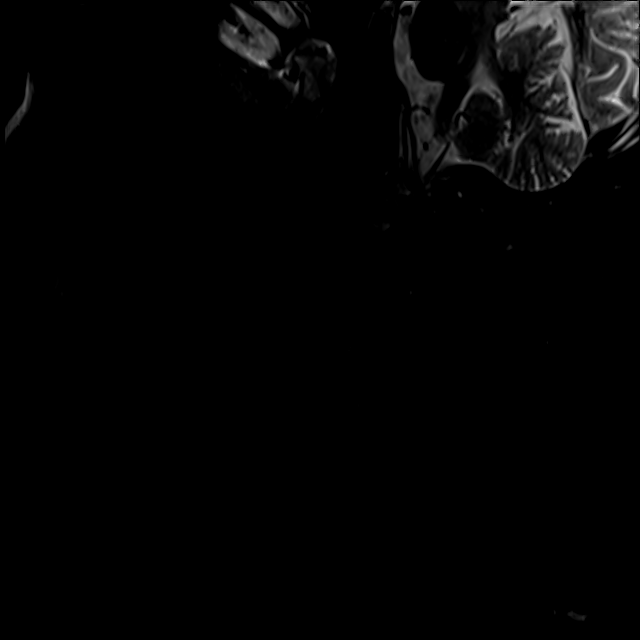
[im 15/15]
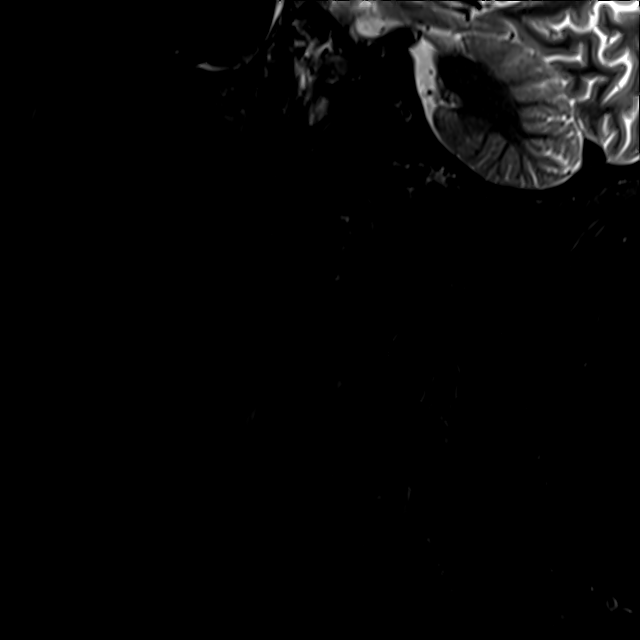

[Series 9: T2 · axial · 3.0mm · 0.50mm/px · z∈[-95,+11]mm · 9 of 35 slices shown (2 of 2)]
[im 1/35]
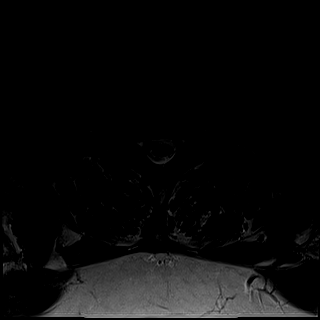
[im 5/35]
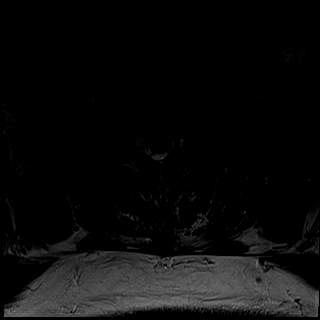
[im 10/35]
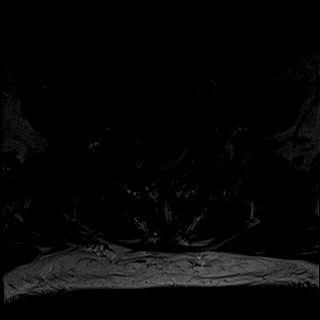
[im 15/35]
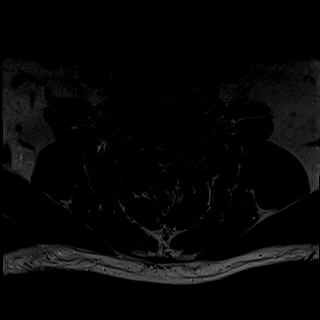
[im 18/35]
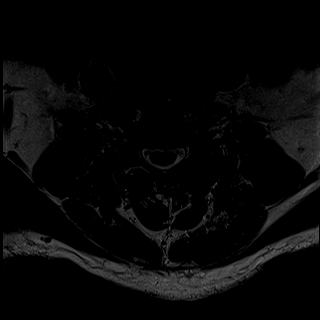
[im 20/35]
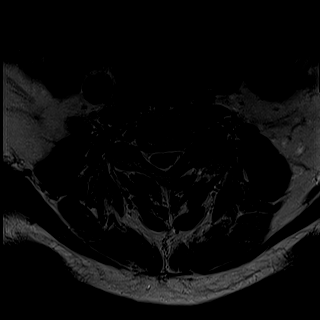
[im 25/35]
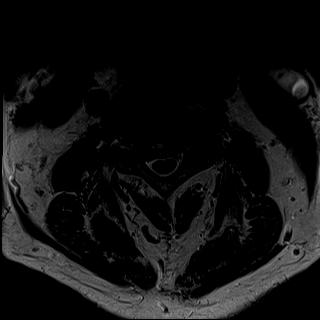
[im 30/35]
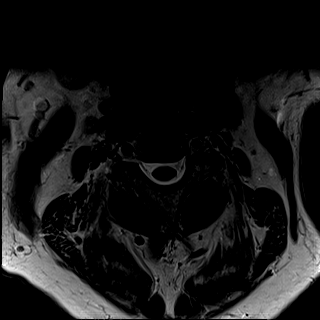
[im 35/35]
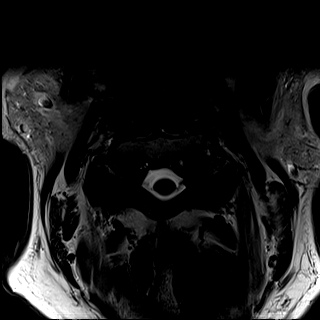

[Series 10: GRE · axial · 3.0mm · 0.42mm/px · 1 of 35 slices shown]
[im 1/35]
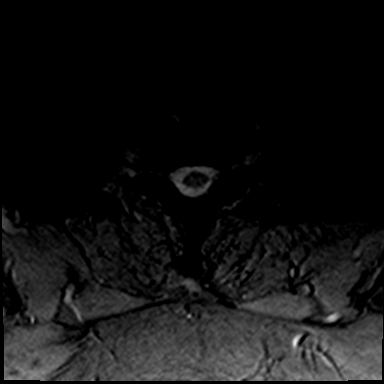

[28 of 48 positions shown; findings below may reference images not displayed]

FINDINGS: Alignment: Mild retrolisthesis C5-6 and C6-7. Mild anterolisthesis
C7-T1

Vertebrae: Negative for fracture or mass.  Normal bone marrow.

Cord: Normal signal and morphology

Posterior Fossa, vertebral arteries, paraspinal tissues: Negative

Disc levels:

C2-3: Mild disc degeneration and spurring. Mild facet degeneration.
Mild left foraminal narrowing.

C3-4: Central and right-sided disc protrusion with associated
spurring. Severe right foraminal encroachment. Cord flattening with
mild spinal stenosis. Mild to moderate left foraminal narrowing.

C4-5: Disc degeneration with diffuse uncinate spurring. Left-sided
facet hypertrophy. Moderate foraminal stenosis bilaterally left
greater than right

C5-6: Disc degeneration with prominent diffuse uncinate spurring.
Cord flattening with moderate spinal stenosis. Moderate foraminal
narrowing bilaterally left greater than right.

C6-7: Disc degeneration with prominent diffuse uncinate spurring.
Cord flattening with moderate spinal stenosis. Moderate foraminal
stenosis bilaterally due to spurring.

C7-T1: Mild disc and facet degeneration.  Negative for stenosis.
IMPRESSION: Multilevel cervical spondylosis.

Mild spinal stenosis C3-4 with severe right foraminal narrowing and
mild to moderate left foraminal narrowing

Moderate foraminal stenosis bilaterally left greater than right at
C4-5

Moderate spinal stenosis and moderate foraminal stenosis bilaterally
at C5-6 and C6-7.

## 2024-01-17 ENCOUNTER — Ambulatory Visit: Admitting: Podiatry

## 2024-02-18 ENCOUNTER — Other Ambulatory Visit: Payer: Self-pay | Admitting: Internal Medicine

## 2024-02-18 ENCOUNTER — Ambulatory Visit
Admission: RE | Admit: 2024-02-18 | Discharge: 2024-02-18 | Disposition: A | Discharge status: Home / Self Care | Attending: Internal Medicine | Admitting: Internal Medicine

## 2024-02-18 ENCOUNTER — Ambulatory Visit
Admission: RE | Admit: 2024-02-18 | Discharge: 2024-02-18 | Disposition: A | Discharge status: Home / Self Care | Source: Ambulatory Visit | Attending: Internal Medicine | Admitting: Internal Medicine

## 2024-02-18 DIAGNOSIS — M25511 Pain in right shoulder: Secondary | ICD-10-CM

## 2024-03-04 ENCOUNTER — Other Ambulatory Visit: Payer: Self-pay

## 2024-03-04 DIAGNOSIS — N401 Enlarged prostate with lower urinary tract symptoms: Secondary | ICD-10-CM

## 2024-03-06 ENCOUNTER — Other Ambulatory Visit: Payer: Self-pay

## 2024-03-06 DIAGNOSIS — N138 Other obstructive and reflux uropathy: Secondary | ICD-10-CM

## 2024-03-07 LAB — PSA: Prostate Specific Ag, Serum: 0.2 ng/mL (ref 0.0–4.0)

## 2024-03-11 NOTE — Progress Notes (Signed)
 8:17 PM   Duane Hughes 10-06-1950 [id number]  Referring provider: Judyth Isaiah Bottcher, DO [address] [place],  [place] [address]  Urological history: 1. ED -contributing factors of age, BPH, DM, HTN, HLD, sleep apnea, depression, antidepressants, pain medication and blood pressure medications -managed with sildenafil  20 mg, on-demand-dosing  2. BPH with LU TS -PSA (03/2024) 0.2  HPI: Duane Hughes is  a 74 y.o. male who presents for a yearly visit.    Previous records reviewed.   I PSS 21/5  He reports sensation of incomplete bladder emptying, urinary frequency, urinary intermittency, urinary urgency, a weak urinary stream,  nocturia x 2, leaking before being able to reach the restroom, and post void dribbling.   He is having to go urgently when he goes from standing to sitting or moving around.  This has been going on for the last 6 months.  Patient denies any modifying or aggravating factors.  Patient denies any recent UTI's, gross hematuria, dysuria or suprapubic/flank pain.  Patient denies any fevers, chills, nausea or vomiting.    UA yellow clear, specific gravity1.025, pH 6.0, 2 urobilinogen, trace ketone, 0-5 WBCs, 0-2 RBCs and 0-10 epithelial cells  PVR 0 mL  PSA (03/2024) 0.2  Serum creatinine (05/2023) 0.76, eGFR > 60  Fluid consumption: He is drinking sugar free Gator Aide and [employer]. He does not drink any water .    SHIM 13  He does not have confidence that he could get and keep an erection, his erections are not firm enough for penetrative intercourse, he has difficulty maintaining his erections, and he is not finding intercourse satisfactory for him.  Patient is not having spontaneous erections.  He denies any pain or curvature with erections.  He is not having issues with ejaculation.  The Viagra  is not as effective as it has been in the past.    PMH: Past Medical History:  Diagnosis Date   Anemia    Anxiety    Dr.Jessica Carter .  well controlled   Arthritis    back, hands, knees   Colon polyp 05/09/2016   TUBULAR ADENOMAS    Depression    Diabetes mellitus without complication (HCC)    type 2   Ectatic abdominal aorta 04/18/2016   2.9 cm on US  2016, unchanged 2017   History of hiatal hernia    20 years ago   History of skin cancer    hand   Hyperlipidemia    Hypertension 2015   Dr. Hinton   Morbid obesity (HCC) 11/11/2014   Neuropathy    Obesity 11/11/2014   Sleep apnea    cpap   Squamous cell carcinoma of skin 04/11/2018   SCC In Situ R dorsum wrist   Squamous cell carcinoma of skin 06/23/2015   R dorsum hand    Surgical History: Past Surgical History:  Procedure Laterality Date   COLONOSCOPY WITH PROPOFOL  N/A 05/09/2016   Procedure: COLONOSCOPY WITH PROPOFOL ;  Surgeon: Rogelia Copping, MD;  Location: [facility];  Service: Endoscopy;  Laterality: N/A;   HERNIA REPAIR     Hiatal   JOINT REPLACEMENT     04-04-17 Dr. Heide   Right knee   KNEE ARTHROSCOPY     several scopes done   KNEE CLOSED REDUCTION Right 05/31/2017   Procedure: CLOSED MANIPULATION RIGHT KNEE;  Surgeon: Heide Ingle, MD;  Location: [facility];  Service: Orthopedics;  Laterality: Right;  Femoral Block   LUMBAR LAMINECTOMY/DECOMPRESSION MICRODISCECTOMY N/A 01/26/2020   Procedure: L3, L4  LAMINECTOMIES;  Surgeon: Bluford Standing, MD;  Location: [facility];  Service: Neurosurgery;  Laterality: N/A;   MEDIAL PARTIAL KNEE REPLACEMENT Left    2000s?   SKIN CANCER EXCISION  Feb 2017   removed from right hand   TOTAL KNEE ARTHROPLASTY Right 04/04/2017   Procedure: RIGHT TOTAL KNEE ARTHROPLASTY;  Surgeon: Heide Ingle, MD;  Location: [facility];  Service: Orthopedics;  Laterality: Right;   TOTAL KNEE ARTHROPLASTY Left 09/24/2018   Procedure: REVISION LEFT KNEE ARTHROPLASTY;  Surgeon: Ernie Cough, MD;  Location: [facility];  Service: Orthopedics;  Laterality: Left;  70 mins    Home Medications:  Allergies as of 03/13/2024       Reactions    Vilazodone          Medication List        Accurate as of March 13, 2024 11:59 PM. If you have any questions, ask your nurse or doctor.          STOP taking these medications    meloxicam 15 MG tablet Commonly known as: MOBIC Stopped by: Duane CORNWALL   sildenafil  100 MG tablet Commonly known as: VIAGRA  Stopped by: Duane CORNWALL       TAKE these medications    aspirin  EC 81 MG tablet Take 81 mg by mouth daily. Swallow whole.   atorvastatin  20 MG tablet Commonly known as: LIPITOR Take 20 mg by mouth daily.   B-12 5000 MCG Subl Place under the tongue.   celecoxib  200 MG capsule Commonly known as: CELEBREX    Ferrous Sulfate  Dried 45 MG Tbcr Take 45 mg by mouth every other day.   hydrocortisone  2.5 % cream Apply topically daily. 3 times per week   ketoconazole  2 % cream Commonly known as: NIZORAL  Apply to aa face QD 3d/wk.   lisinopril  5 MG tablet Commonly known as: ZESTRIL  Take 0.5 tablets (2.5 mg total) by mouth daily.   metFORMIN  500 MG tablet Commonly known as: GLUCOPHAGE  Take 2 tablets with breakfast and 1 tablet with dinner daily.   Ozempic (1 MG/DOSE) 4 MG/3ML Sopn Generic drug: Semaglutide (1 MG/DOSE) Inject into the skin.   pramipexole  0.75 MG tablet Commonly known as: MIRAPEX  TAKE 1 TABLET TWICE A DAY   sertraline  50 MG tablet Commonly known as: ZOLOFT    SLEEP PO Take by mouth.   tadalafil 20 MG tablet Commonly known as: CIALIS Take 1 to 2 tablets of tadalafil one hour prior to intercourse. DO NOT TAKE WITH NITRATES Started by: Jaxyn Rout   Vitamin D  (Ergocalciferol ) 50 MCG (2000 UT) Caps        Allergies:  Allergies  Allergen Reactions   Vilazodone      Family History: Family History  Problem Relation Age of Onset   Alcohol abuse Mother    Rheum arthritis Mother    Stroke Maternal Grandmother    Stroke Paternal Grandfather    Dementia Paternal Grandmother    Cancer Neg Hx    Diabetes Neg Hx    Heart  disease Neg Hx    Hypertension Neg Hx    Kidney disease Neg Hx    Prostate cancer Neg Hx     Social History:  reports that he has never smoked. He has never used smokeless tobacco. He reports that he does not drink alcohol and does not use drugs.  ROS: For pertinent review of systems please refer to history of present illness  Physical Exam: BP (!) 148/74   Pulse 73   Ht 5' 9 (1.753 m)  Wt 238 lb (108 kg)   BMI 35.15 kg/m   Constitutional:  Well nourished. Alert and oriented, No acute distress. HEENT: [place] AT, moist mucus membranes.  Trachea midline Cardiovascular: No clubbing, cyanosis, or edema. Respiratory: Normal respiratory effort, no increased work of breathing. Neurologic: Grossly intact, no focal deficits, moving all 4 extremities. Psychiatric: Normal mood and affect.   Laboratory Data: See Epic and HPI   I have reviewed the labs.  See HPI.     Pertinent Imaging  03/13/24 14:10  Scan Result 0ml   Assessment & Plan:    1. Erectile dysfunction - We discussed trying a different PDE5 inhibitor, intra-urethral suppositories, ICI, vacuum erection devices, Li-SWT, and penile prosthesis implantation  - he was interested in trying a different PDE5 inhibitor, so I have sent in tadalafil 20 mg, on-demand-dosing and informed him he could take two tablets prior to intercourse  - tadalafil sent to pharmacy   2. BPH with LU TS - severe symptoms and he is unhappy about his urinary symptoms - UA benign  - PSA up to date  - PVR < 300 cc  - most bothersome symptoms are urgency  - encouraged avoiding bladder irritants, fluid restriction before bedtime and timed voiding's - he is not wanting to pursue pharmacological treatment at this time, but he will start drinking more water  to see if that makes a different      Return in about 3 months (around 06/13/2024) for I PSS and SHIM .  These notes generated with voice recognition software. I apologize for typographical  errors.  Duane Hughes  [facility] [address] [place], [place] [address] [phone]

## 2024-03-13 ENCOUNTER — Encounter: Payer: Self-pay | Admitting: Urology

## 2024-03-13 ENCOUNTER — Ambulatory Visit: Payer: Self-pay | Admitting: Urology

## 2024-03-13 VITALS — BP 148/74 | HR 73 | Ht 69.0 in | Wt 238.0 lb

## 2024-03-13 DIAGNOSIS — N401 Enlarged prostate with lower urinary tract symptoms: Secondary | ICD-10-CM

## 2024-03-13 DIAGNOSIS — N138 Other obstructive and reflux uropathy: Secondary | ICD-10-CM | POA: Diagnosis not present

## 2024-03-13 DIAGNOSIS — N529 Male erectile dysfunction, unspecified: Secondary | ICD-10-CM

## 2024-03-13 LAB — BLADDER SCAN AMB NON-IMAGING

## 2024-03-13 LAB — MICROSCOPIC EXAMINATION: Bacteria, UA: NONE SEEN

## 2024-03-13 LAB — URINALYSIS, COMPLETE
Bilirubin, UA: NEGATIVE
Glucose, UA: NEGATIVE
Leukocytes,UA: NEGATIVE
Nitrite, UA: NEGATIVE
Protein,UA: NEGATIVE
RBC, UA: NEGATIVE
Specific Gravity, UA: 1.025 (ref 1.005–1.030)
Urobilinogen, Ur: 2 mg/dL — ABNORMAL HIGH (ref 0.2–1.0)
pH, UA: 6 (ref 5.0–7.5)

## 2024-03-13 MED ORDER — TADALAFIL 20 MG PO TABS: ORAL_TABLET | ORAL | 3 refills | Status: AC

## 2024-04-14 ENCOUNTER — Other Ambulatory Visit: Payer: Self-pay

## 2024-04-14 DIAGNOSIS — N138 Other obstructive and reflux uropathy: Secondary | ICD-10-CM

## 2024-04-14 MED ORDER — FINASTERIDE 5 MG PO TABS: 5.0000 mg | ORAL_TABLET | Freq: Every day | ORAL | 6 refills | Status: AC

## 2024-04-14 NOTE — Telephone Encounter (Signed)
 Patient advised via Mychart that prescription sent in to pharmacy for Finasteride  5mg  , PO every day.  Andrea Kirks LPN

## 2024-06-12 ENCOUNTER — Ambulatory Visit: Admitting: Dermatology

## 2024-06-12 ENCOUNTER — Ambulatory Visit: Payer: Medicare HMO | Admitting: Dermatology

## 2024-06-17 ENCOUNTER — Ambulatory Visit: Admitting: Urology

## 2024-06-24 ENCOUNTER — Ambulatory Visit: Admitting: Urology
